# Patient Record
Sex: Female | Born: 1940 | Race: White | Hispanic: No | Smoking: Former smoker
Health system: Southern US, Community
[De-identification: ages and names within clinical notes are randomized; demographics above are authoritative.]

## PROBLEM LIST (undated history)

## (undated) DIAGNOSIS — J189 Pneumonia, unspecified organism: Secondary | ICD-10-CM

## (undated) DIAGNOSIS — D229 Melanocytic nevi, unspecified: Secondary | ICD-10-CM

## (undated) DIAGNOSIS — M8580 Other specified disorders of bone density and structure, unspecified site: Secondary | ICD-10-CM

## (undated) DIAGNOSIS — K5730 Diverticulosis of large intestine without perforation or abscess without bleeding: Secondary | ICD-10-CM

## (undated) DIAGNOSIS — I5189 Other ill-defined heart diseases: Secondary | ICD-10-CM

## (undated) DIAGNOSIS — F419 Anxiety disorder, unspecified: Secondary | ICD-10-CM

## (undated) DIAGNOSIS — D649 Anemia, unspecified: Secondary | ICD-10-CM

## (undated) DIAGNOSIS — M797 Fibromyalgia: Secondary | ICD-10-CM

## (undated) DIAGNOSIS — I671 Cerebral aneurysm, nonruptured: Secondary | ICD-10-CM

## (undated) DIAGNOSIS — F329 Major depressive disorder, single episode, unspecified: Secondary | ICD-10-CM

## (undated) DIAGNOSIS — J449 Chronic obstructive pulmonary disease, unspecified: Secondary | ICD-10-CM

## (undated) DIAGNOSIS — I480 Paroxysmal atrial fibrillation: Secondary | ICD-10-CM

## (undated) DIAGNOSIS — G8929 Other chronic pain: Secondary | ICD-10-CM

## (undated) DIAGNOSIS — F32A Depression, unspecified: Secondary | ICD-10-CM

## (undated) DIAGNOSIS — K219 Gastro-esophageal reflux disease without esophagitis: Secondary | ICD-10-CM

## (undated) DIAGNOSIS — G4733 Obstructive sleep apnea (adult) (pediatric): Secondary | ICD-10-CM

## (undated) DIAGNOSIS — M549 Dorsalgia, unspecified: Secondary | ICD-10-CM

## (undated) DIAGNOSIS — I10 Essential (primary) hypertension: Secondary | ICD-10-CM

## (undated) DIAGNOSIS — H8109 Meniere's disease, unspecified ear: Secondary | ICD-10-CM

## (undated) DIAGNOSIS — N390 Urinary tract infection, site not specified: Secondary | ICD-10-CM

## (undated) DIAGNOSIS — M159 Polyosteoarthritis, unspecified: Secondary | ICD-10-CM

## (undated) DIAGNOSIS — Z90710 Acquired absence of both cervix and uterus: Secondary | ICD-10-CM

## (undated) DIAGNOSIS — A419 Sepsis, unspecified organism: Secondary | ICD-10-CM

## (undated) DIAGNOSIS — E876 Hypokalemia: Secondary | ICD-10-CM

## (undated) DIAGNOSIS — E785 Hyperlipidemia, unspecified: Secondary | ICD-10-CM

## (undated) DIAGNOSIS — G9389 Other specified disorders of brain: Secondary | ICD-10-CM

## (undated) HISTORY — DX: Hypokalemia: E87.6

## (undated) HISTORY — DX: Paroxysmal atrial fibrillation: I48.0

## (undated) HISTORY — DX: Major depressive disorder, single episode, unspecified: F32.9

## (undated) HISTORY — DX: Hyperlipidemia, unspecified: E78.5

## (undated) HISTORY — DX: Meniere's disease, unspecified ear: H81.09

## (undated) HISTORY — DX: Pneumonia, unspecified organism: J18.9

## (undated) HISTORY — DX: Melanocytic nevi, unspecified: D22.9

## (undated) HISTORY — DX: Chronic obstructive pulmonary disease, unspecified: J44.9

## (undated) HISTORY — DX: Cerebral aneurysm, nonruptured: I67.1

## (undated) HISTORY — DX: Other chronic pain: G89.29

## (undated) HISTORY — DX: Gastro-esophageal reflux disease without esophagitis: K21.9

## (undated) HISTORY — DX: Other ill-defined heart diseases: I51.89

## (undated) HISTORY — DX: Urinary tract infection, site not specified: N39.0

## (undated) HISTORY — DX: Sepsis, unspecified organism: A41.9

## (undated) HISTORY — DX: Acquired absence of both cervix and uterus: Z90.710

## (undated) HISTORY — DX: Other specified disorders of bone density and structure, unspecified site: M85.80

## (undated) HISTORY — DX: Essential (primary) hypertension: I10

## (undated) HISTORY — DX: Anxiety disorder, unspecified: F41.9

## (undated) HISTORY — DX: Other specified disorders of brain: G93.89

## (undated) HISTORY — DX: Diverticulosis of large intestine without perforation or abscess without bleeding: K57.30

## (undated) HISTORY — DX: Dorsalgia, unspecified: M54.9

## (undated) HISTORY — DX: Polyosteoarthritis, unspecified: M15.9

## (undated) HISTORY — DX: Obstructive sleep apnea (adult) (pediatric): G47.33

## (undated) HISTORY — DX: Fibromyalgia: M79.7

## (undated) HISTORY — DX: Anemia, unspecified: D64.9

## (undated) HISTORY — DX: Depression, unspecified: F32.A

---

## 1973-12-06 DIAGNOSIS — Z90710 Acquired absence of both cervix and uterus: Secondary | ICD-10-CM

## 1973-12-06 HISTORY — PX: ABDOMINAL HYSTERECTOMY: SHX81

## 1973-12-06 HISTORY — DX: Acquired absence of both cervix and uterus: Z90.710

## 1998-04-14 ENCOUNTER — Encounter: Admission: RE | Admit: 1998-04-14 | Discharge: 1998-04-14 | Payer: Self-pay | Admitting: Internal Medicine

## 1998-05-12 ENCOUNTER — Encounter: Admission: RE | Admit: 1998-05-12 | Discharge: 1998-05-12 | Payer: Self-pay | Admitting: Internal Medicine

## 1998-07-01 ENCOUNTER — Ambulatory Visit (HOSPITAL_COMMUNITY): Admission: RE | Admit: 1998-07-01 | Discharge: 1998-07-01 | Payer: Self-pay | Admitting: Internal Medicine

## 1998-07-10 ENCOUNTER — Encounter: Admission: RE | Admit: 1998-07-10 | Discharge: 1998-07-10 | Payer: Self-pay | Admitting: Internal Medicine

## 1998-10-16 ENCOUNTER — Encounter: Payer: Self-pay | Admitting: Internal Medicine

## 1998-10-16 ENCOUNTER — Ambulatory Visit (HOSPITAL_COMMUNITY): Admission: RE | Admit: 1998-10-16 | Discharge: 1998-10-16 | Payer: Self-pay | Admitting: Internal Medicine

## 1998-10-16 ENCOUNTER — Encounter: Admission: RE | Admit: 1998-10-16 | Discharge: 1998-10-16 | Payer: Self-pay | Admitting: Internal Medicine

## 1998-12-11 ENCOUNTER — Encounter: Admission: RE | Admit: 1998-12-11 | Discharge: 1998-12-11 | Payer: Self-pay | Admitting: Internal Medicine

## 1999-01-15 ENCOUNTER — Encounter: Admission: RE | Admit: 1999-01-15 | Discharge: 1999-01-15 | Payer: Self-pay | Admitting: Internal Medicine

## 1999-03-10 ENCOUNTER — Encounter: Admission: RE | Admit: 1999-03-10 | Discharge: 1999-03-10 | Payer: Self-pay | Admitting: Internal Medicine

## 1999-05-14 ENCOUNTER — Encounter: Admission: RE | Admit: 1999-05-14 | Discharge: 1999-05-14 | Payer: Self-pay | Admitting: Internal Medicine

## 1999-06-22 ENCOUNTER — Ambulatory Visit (HOSPITAL_COMMUNITY): Admission: RE | Admit: 1999-06-22 | Discharge: 1999-06-22 | Payer: Self-pay | Admitting: Obstetrics & Gynecology

## 1999-07-08 ENCOUNTER — Encounter: Admission: RE | Admit: 1999-07-08 | Discharge: 1999-07-08 | Payer: Self-pay | Admitting: Hematology and Oncology

## 1999-07-15 ENCOUNTER — Encounter: Admission: RE | Admit: 1999-07-15 | Discharge: 1999-07-15 | Payer: Self-pay | Admitting: Hematology and Oncology

## 1999-08-12 ENCOUNTER — Encounter: Admission: RE | Admit: 1999-08-12 | Discharge: 1999-08-12 | Payer: Self-pay | Admitting: Internal Medicine

## 1999-11-11 ENCOUNTER — Encounter: Admission: RE | Admit: 1999-11-11 | Discharge: 1999-11-11 | Payer: Self-pay | Admitting: Internal Medicine

## 2000-02-17 ENCOUNTER — Encounter: Admission: RE | Admit: 2000-02-17 | Discharge: 2000-02-17 | Payer: Self-pay | Admitting: Internal Medicine

## 2000-05-25 ENCOUNTER — Encounter: Admission: RE | Admit: 2000-05-25 | Discharge: 2000-05-25 | Payer: Self-pay | Admitting: Internal Medicine

## 2000-06-28 ENCOUNTER — Ambulatory Visit (HOSPITAL_COMMUNITY): Admission: RE | Admit: 2000-06-28 | Discharge: 2000-06-28 | Payer: Self-pay | Admitting: Internal Medicine

## 2000-07-04 ENCOUNTER — Encounter: Admission: RE | Admit: 2000-07-04 | Discharge: 2000-07-04 | Payer: Self-pay | Admitting: Internal Medicine

## 2000-08-10 ENCOUNTER — Encounter: Admission: RE | Admit: 2000-08-10 | Discharge: 2000-08-10 | Payer: Self-pay | Admitting: Internal Medicine

## 2000-08-10 ENCOUNTER — Encounter: Payer: Self-pay | Admitting: Internal Medicine

## 2000-08-10 ENCOUNTER — Ambulatory Visit (HOSPITAL_COMMUNITY): Admission: RE | Admit: 2000-08-10 | Discharge: 2000-08-10 | Payer: Self-pay | Admitting: Internal Medicine

## 2000-10-19 ENCOUNTER — Encounter: Admission: RE | Admit: 2000-10-19 | Discharge: 2000-10-19 | Payer: Self-pay | Admitting: Internal Medicine

## 2000-10-19 ENCOUNTER — Ambulatory Visit (HOSPITAL_COMMUNITY): Admission: RE | Admit: 2000-10-19 | Discharge: 2000-10-19 | Payer: Self-pay | Admitting: Internal Medicine

## 2000-10-19 ENCOUNTER — Encounter: Payer: Self-pay | Admitting: Internal Medicine

## 2001-02-06 ENCOUNTER — Encounter: Admission: RE | Admit: 2001-02-06 | Discharge: 2001-02-06 | Payer: Self-pay | Admitting: Internal Medicine

## 2001-02-07 ENCOUNTER — Encounter: Admission: RE | Admit: 2001-02-07 | Discharge: 2001-02-07 | Payer: Self-pay | Admitting: Hematology and Oncology

## 2001-03-22 ENCOUNTER — Encounter: Admission: RE | Admit: 2001-03-22 | Discharge: 2001-03-22 | Payer: Self-pay | Admitting: Internal Medicine

## 2001-05-24 ENCOUNTER — Encounter: Admission: RE | Admit: 2001-05-24 | Discharge: 2001-05-24 | Payer: Self-pay | Admitting: Internal Medicine

## 2001-06-27 ENCOUNTER — Encounter: Admission: RE | Admit: 2001-06-27 | Discharge: 2001-06-27 | Payer: Self-pay | Admitting: Internal Medicine

## 2001-07-02 ENCOUNTER — Ambulatory Visit (HOSPITAL_COMMUNITY): Admission: RE | Admit: 2001-07-02 | Discharge: 2001-07-02 | Payer: Self-pay | Admitting: Internal Medicine

## 2001-07-02 ENCOUNTER — Encounter: Payer: Self-pay | Admitting: Internal Medicine

## 2001-07-05 ENCOUNTER — Ambulatory Visit (HOSPITAL_COMMUNITY): Admission: RE | Admit: 2001-07-05 | Discharge: 2001-07-05 | Payer: Self-pay | Admitting: *Deleted

## 2001-08-09 ENCOUNTER — Encounter: Admission: RE | Admit: 2001-08-09 | Discharge: 2001-08-09 | Payer: Self-pay | Admitting: Internal Medicine

## 2001-09-06 ENCOUNTER — Encounter: Admission: RE | Admit: 2001-09-06 | Discharge: 2001-09-06 | Payer: Self-pay | Admitting: Internal Medicine

## 2001-11-15 ENCOUNTER — Encounter: Admission: RE | Admit: 2001-11-15 | Discharge: 2001-11-15 | Payer: Self-pay | Admitting: Internal Medicine

## 2001-12-15 ENCOUNTER — Emergency Department (HOSPITAL_COMMUNITY): Admission: EM | Admit: 2001-12-15 | Discharge: 2001-12-15 | Payer: Self-pay | Admitting: Emergency Medicine

## 2001-12-15 ENCOUNTER — Encounter: Payer: Self-pay | Admitting: Emergency Medicine

## 2002-01-24 ENCOUNTER — Encounter: Admission: RE | Admit: 2002-01-24 | Discharge: 2002-01-24 | Payer: Self-pay | Admitting: Internal Medicine

## 2002-02-09 ENCOUNTER — Encounter: Admission: RE | Admit: 2002-02-09 | Discharge: 2002-02-09 | Payer: Self-pay | Admitting: Internal Medicine

## 2002-02-28 ENCOUNTER — Encounter: Admission: RE | Admit: 2002-02-28 | Discharge: 2002-02-28 | Payer: Self-pay | Admitting: Internal Medicine

## 2002-05-14 ENCOUNTER — Encounter: Admission: RE | Admit: 2002-05-14 | Discharge: 2002-05-14 | Payer: Self-pay | Admitting: Internal Medicine

## 2002-05-30 ENCOUNTER — Encounter: Admission: RE | Admit: 2002-05-30 | Discharge: 2002-05-30 | Payer: Self-pay | Admitting: Internal Medicine

## 2002-07-04 ENCOUNTER — Encounter: Admission: RE | Admit: 2002-07-04 | Discharge: 2002-07-04 | Payer: Self-pay | Admitting: Internal Medicine

## 2002-07-18 ENCOUNTER — Ambulatory Visit (HOSPITAL_COMMUNITY): Admission: RE | Admit: 2002-07-18 | Discharge: 2002-07-18 | Payer: Self-pay | Admitting: Internal Medicine

## 2002-09-27 ENCOUNTER — Encounter: Admission: RE | Admit: 2002-09-27 | Discharge: 2002-09-27 | Payer: Self-pay | Admitting: Internal Medicine

## 2002-12-19 ENCOUNTER — Encounter: Admission: RE | Admit: 2002-12-19 | Discharge: 2002-12-19 | Payer: Self-pay | Admitting: Internal Medicine

## 2003-03-12 ENCOUNTER — Encounter: Admission: RE | Admit: 2003-03-12 | Discharge: 2003-03-12 | Payer: Self-pay | Admitting: Internal Medicine

## 2003-03-20 ENCOUNTER — Encounter: Admission: RE | Admit: 2003-03-20 | Discharge: 2003-03-20 | Payer: Self-pay | Admitting: Internal Medicine

## 2003-05-01 ENCOUNTER — Encounter: Admission: RE | Admit: 2003-05-01 | Discharge: 2003-05-01 | Payer: Self-pay | Admitting: Internal Medicine

## 2003-06-19 ENCOUNTER — Encounter: Payer: Self-pay | Admitting: Internal Medicine

## 2003-06-19 ENCOUNTER — Encounter: Admission: RE | Admit: 2003-06-19 | Discharge: 2003-06-19 | Payer: Self-pay | Admitting: Internal Medicine

## 2003-06-19 ENCOUNTER — Ambulatory Visit (HOSPITAL_COMMUNITY): Admission: RE | Admit: 2003-06-19 | Discharge: 2003-06-19 | Payer: Self-pay | Admitting: Internal Medicine

## 2003-07-24 ENCOUNTER — Encounter: Admission: RE | Admit: 2003-07-24 | Discharge: 2003-07-24 | Payer: Self-pay | Admitting: Internal Medicine

## 2003-08-20 ENCOUNTER — Ambulatory Visit (HOSPITAL_COMMUNITY): Admission: RE | Admit: 2003-08-20 | Discharge: 2003-08-20 | Payer: Self-pay | Admitting: Internal Medicine

## 2003-10-03 ENCOUNTER — Encounter: Admission: RE | Admit: 2003-10-03 | Discharge: 2003-10-03 | Payer: Self-pay | Admitting: Internal Medicine

## 2003-12-12 ENCOUNTER — Encounter: Admission: RE | Admit: 2003-12-12 | Discharge: 2003-12-12 | Payer: Self-pay | Admitting: Internal Medicine

## 2004-01-29 ENCOUNTER — Encounter: Admission: RE | Admit: 2004-01-29 | Discharge: 2004-01-29 | Payer: Self-pay | Admitting: Internal Medicine

## 2004-04-28 ENCOUNTER — Encounter: Admission: RE | Admit: 2004-04-28 | Discharge: 2004-04-28 | Payer: Self-pay | Admitting: Internal Medicine

## 2004-06-03 ENCOUNTER — Encounter: Admission: RE | Admit: 2004-06-03 | Discharge: 2004-06-03 | Payer: Self-pay | Admitting: Internal Medicine

## 2004-06-09 ENCOUNTER — Encounter: Admission: RE | Admit: 2004-06-09 | Discharge: 2004-06-09 | Payer: Self-pay | Admitting: Otolaryngology

## 2004-06-17 ENCOUNTER — Encounter: Payer: Self-pay | Admitting: Otolaryngology

## 2004-07-01 ENCOUNTER — Encounter: Admission: RE | Admit: 2004-07-01 | Discharge: 2004-07-01 | Payer: Self-pay | Admitting: Internal Medicine

## 2004-07-22 ENCOUNTER — Inpatient Hospital Stay (HOSPITAL_COMMUNITY): Admission: RE | Admit: 2004-07-22 | Discharge: 2004-07-23 | Payer: Self-pay | Admitting: Interventional Radiology

## 2004-09-02 ENCOUNTER — Ambulatory Visit: Payer: Self-pay | Admitting: Internal Medicine

## 2004-10-16 ENCOUNTER — Ambulatory Visit (HOSPITAL_COMMUNITY): Admission: RE | Admit: 2004-10-16 | Discharge: 2004-10-16 | Payer: Self-pay | Admitting: Interventional Radiology

## 2004-11-04 ENCOUNTER — Ambulatory Visit: Payer: Self-pay | Admitting: Internal Medicine

## 2004-11-05 DIAGNOSIS — J189 Pneumonia, unspecified organism: Secondary | ICD-10-CM

## 2004-11-05 HISTORY — DX: Pneumonia, unspecified organism: J18.9

## 2004-11-10 ENCOUNTER — Inpatient Hospital Stay (HOSPITAL_COMMUNITY): Admission: RE | Admit: 2004-11-10 | Discharge: 2004-11-11 | Payer: Self-pay | Admitting: Interventional Radiology

## 2004-11-10 HISTORY — PX: ANEURYSM COILING: SHX5349

## 2004-12-01 ENCOUNTER — Inpatient Hospital Stay (HOSPITAL_COMMUNITY): Admission: AD | Admit: 2004-12-01 | Discharge: 2004-12-15 | Payer: Self-pay | Admitting: Internal Medicine

## 2004-12-01 ENCOUNTER — Ambulatory Visit: Payer: Self-pay | Admitting: Internal Medicine

## 2004-12-02 ENCOUNTER — Ambulatory Visit: Payer: Self-pay | Admitting: Internal Medicine

## 2004-12-15 ENCOUNTER — Ambulatory Visit: Payer: Self-pay | Admitting: Physical Medicine & Rehabilitation

## 2004-12-15 ENCOUNTER — Inpatient Hospital Stay
Admission: RE | Admit: 2004-12-15 | Discharge: 2004-12-24 | Payer: Self-pay | Admitting: Physical Medicine & Rehabilitation

## 2004-12-30 ENCOUNTER — Ambulatory Visit (HOSPITAL_COMMUNITY): Admission: RE | Admit: 2004-12-30 | Discharge: 2004-12-30 | Payer: Self-pay | Admitting: Internal Medicine

## 2004-12-30 ENCOUNTER — Ambulatory Visit: Payer: Self-pay | Admitting: Internal Medicine

## 2005-01-08 ENCOUNTER — Ambulatory Visit: Payer: Self-pay | Admitting: Cardiology

## 2005-01-08 ENCOUNTER — Inpatient Hospital Stay (HOSPITAL_COMMUNITY): Admission: EM | Admit: 2005-01-08 | Discharge: 2005-01-15 | Payer: Self-pay | Admitting: Emergency Medicine

## 2005-01-08 ENCOUNTER — Ambulatory Visit: Payer: Self-pay | Admitting: Internal Medicine

## 2005-01-15 ENCOUNTER — Encounter: Payer: Self-pay | Admitting: Cardiology

## 2005-01-27 ENCOUNTER — Ambulatory Visit (HOSPITAL_COMMUNITY): Admission: RE | Admit: 2005-01-27 | Discharge: 2005-01-27 | Payer: Self-pay | Admitting: Internal Medicine

## 2005-01-27 ENCOUNTER — Ambulatory Visit: Payer: Self-pay | Admitting: Internal Medicine

## 2005-02-10 ENCOUNTER — Ambulatory Visit: Payer: Self-pay | Admitting: Internal Medicine

## 2005-02-17 ENCOUNTER — Ambulatory Visit: Payer: Self-pay | Admitting: Internal Medicine

## 2005-03-17 ENCOUNTER — Ambulatory Visit: Payer: Self-pay | Admitting: Internal Medicine

## 2005-03-17 ENCOUNTER — Ambulatory Visit (HOSPITAL_COMMUNITY): Admission: RE | Admit: 2005-03-17 | Discharge: 2005-03-17 | Payer: Self-pay | Admitting: Internal Medicine

## 2005-03-19 ENCOUNTER — Ambulatory Visit (HOSPITAL_COMMUNITY): Admission: RE | Admit: 2005-03-19 | Discharge: 2005-03-19 | Payer: Self-pay | Admitting: Interventional Radiology

## 2005-04-21 ENCOUNTER — Ambulatory Visit: Payer: Self-pay | Admitting: Internal Medicine

## 2005-05-05 ENCOUNTER — Ambulatory Visit: Payer: Self-pay | Admitting: Internal Medicine

## 2005-05-10 ENCOUNTER — Ambulatory Visit: Payer: Self-pay | Admitting: Critical Care Medicine

## 2005-05-11 ENCOUNTER — Ambulatory Visit: Payer: Self-pay | Admitting: Critical Care Medicine

## 2005-05-11 ENCOUNTER — Ambulatory Visit: Payer: Self-pay | Admitting: Internal Medicine

## 2005-05-11 ENCOUNTER — Ambulatory Visit: Payer: Self-pay | Admitting: *Deleted

## 2005-05-14 ENCOUNTER — Ambulatory Visit: Payer: Self-pay | Admitting: Critical Care Medicine

## 2005-06-30 ENCOUNTER — Ambulatory Visit: Payer: Self-pay | Admitting: Internal Medicine

## 2005-07-01 ENCOUNTER — Ambulatory Visit: Payer: Self-pay | Admitting: Critical Care Medicine

## 2005-07-06 ENCOUNTER — Ambulatory Visit (HOSPITAL_COMMUNITY): Admission: RE | Admit: 2005-07-06 | Discharge: 2005-07-06 | Payer: Self-pay | Admitting: Internal Medicine

## 2005-09-01 ENCOUNTER — Ambulatory Visit: Payer: Self-pay | Admitting: Internal Medicine

## 2005-09-01 ENCOUNTER — Ambulatory Visit (HOSPITAL_COMMUNITY): Admission: RE | Admit: 2005-09-01 | Discharge: 2005-09-01 | Payer: Self-pay | Admitting: Internal Medicine

## 2005-10-06 ENCOUNTER — Ambulatory Visit: Payer: Self-pay | Admitting: Internal Medicine

## 2005-10-27 ENCOUNTER — Ambulatory Visit (HOSPITAL_COMMUNITY): Admission: RE | Admit: 2005-10-27 | Discharge: 2005-10-27 | Payer: Self-pay | Admitting: Interventional Radiology

## 2006-01-05 ENCOUNTER — Ambulatory Visit: Payer: Self-pay | Admitting: Internal Medicine

## 2006-02-23 ENCOUNTER — Ambulatory Visit: Payer: Self-pay | Admitting: Critical Care Medicine

## 2006-04-27 ENCOUNTER — Ambulatory Visit: Payer: Self-pay | Admitting: Internal Medicine

## 2006-05-18 ENCOUNTER — Ambulatory Visit: Payer: Self-pay | Admitting: Internal Medicine

## 2006-05-27 ENCOUNTER — Ambulatory Visit: Payer: Self-pay | Admitting: Critical Care Medicine

## 2006-07-20 ENCOUNTER — Ambulatory Visit: Payer: Self-pay | Admitting: Internal Medicine

## 2006-08-03 ENCOUNTER — Ambulatory Visit: Payer: Self-pay | Admitting: Critical Care Medicine

## 2006-08-03 ENCOUNTER — Ambulatory Visit (HOSPITAL_COMMUNITY): Admission: RE | Admit: 2006-08-03 | Discharge: 2006-08-03 | Payer: Self-pay | Admitting: Internal Medicine

## 2006-08-16 ENCOUNTER — Ambulatory Visit: Payer: Self-pay | Admitting: Internal Medicine

## 2006-09-02 ENCOUNTER — Ambulatory Visit (HOSPITAL_COMMUNITY): Admission: RE | Admit: 2006-09-02 | Discharge: 2006-09-02 | Payer: Self-pay | Admitting: Interventional Radiology

## 2006-09-05 ENCOUNTER — Ambulatory Visit: Payer: Self-pay | Admitting: Critical Care Medicine

## 2006-09-29 ENCOUNTER — Ambulatory Visit: Payer: Self-pay | Admitting: Internal Medicine

## 2006-09-29 LAB — CONVERTED CEMR LAB
CO2: 32 meq/L (ref 19–32)
Calcium: 9.4 mg/dL (ref 8.4–10.5)
Sodium: 139 meq/L (ref 135–145)

## 2006-11-12 IMAGING — CT CT EXTREM LOW BILAT W/ CM
1 series · 12 of 14 positions shown, 15 images · non-contrast
Comparison: none

CLINICAL DATA: Pneumonia.  Evaluate for DVT.
CT LOWER EXTREMITY ? DVT PROTOCOL:
Scans were obtained with the DVT protocol.  There is relatively poor opacification of the venous system.  As a result, it would be difficult to exclude deep venous thrombosis and Doppler evaluation of the lower extremities may be helpful if felt to be indicated clinically.  There is an approximate 3 cm in size left popliteal cyst (Baker?s cyst) incidentally noted.

[Series 2: dvt · axial · 0.95mm/px · z∈[-1110,-540]mm · 12 of 23 slices shown, 15 images]
[im 2/23  soft-tissue]
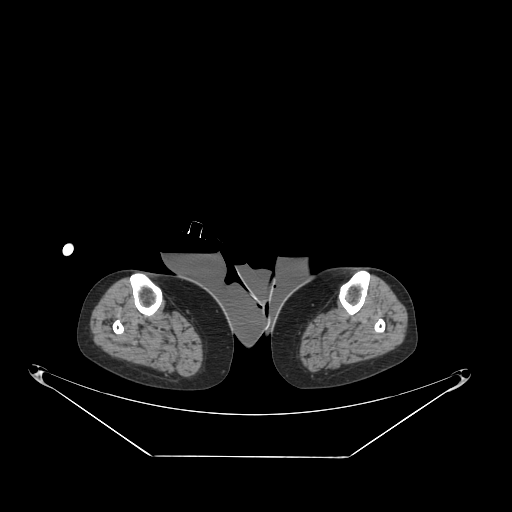
[im 2/23  bone]
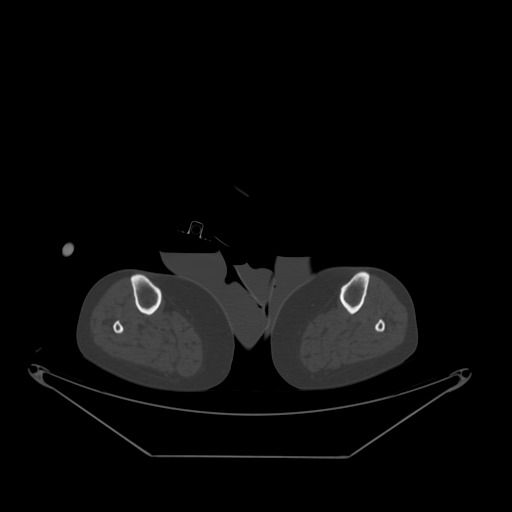
[im 4/23  bone]
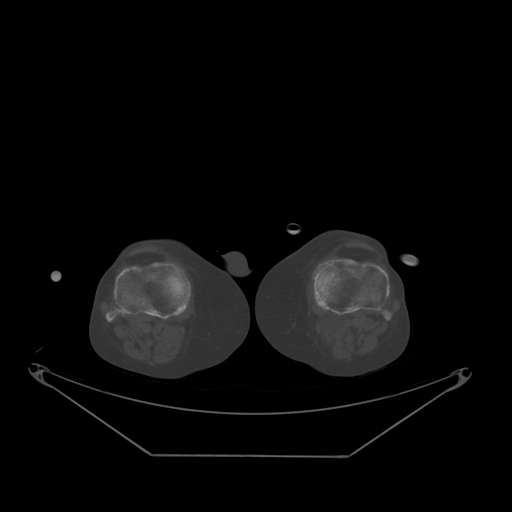
[im 6/23  bone]
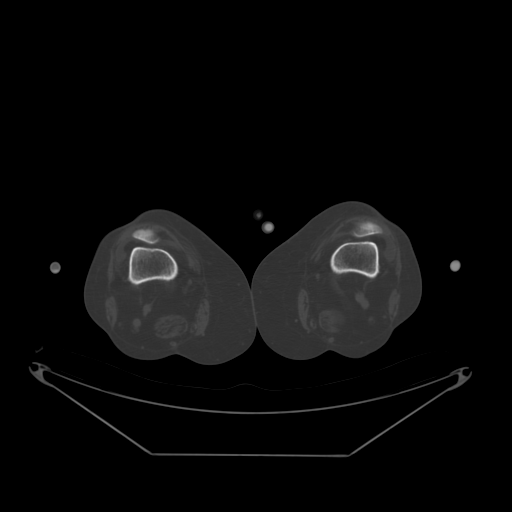
[im 7/23  bone]
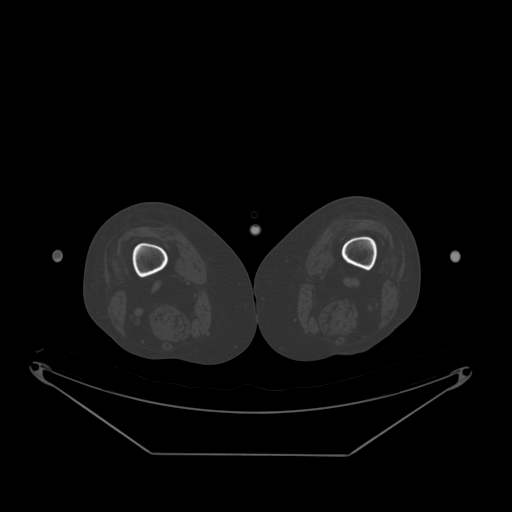
[im 9/23  soft-tissue]
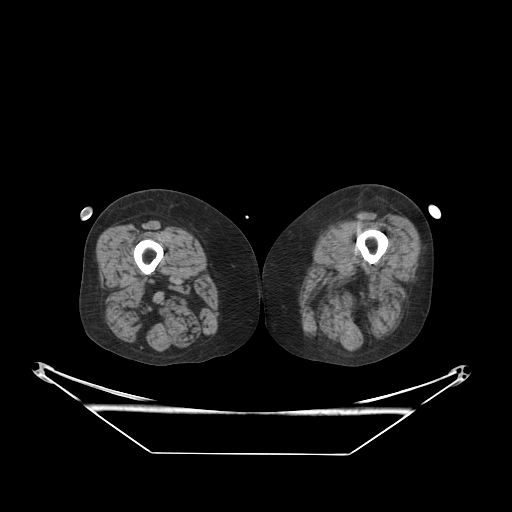
[im 9/23  bone]
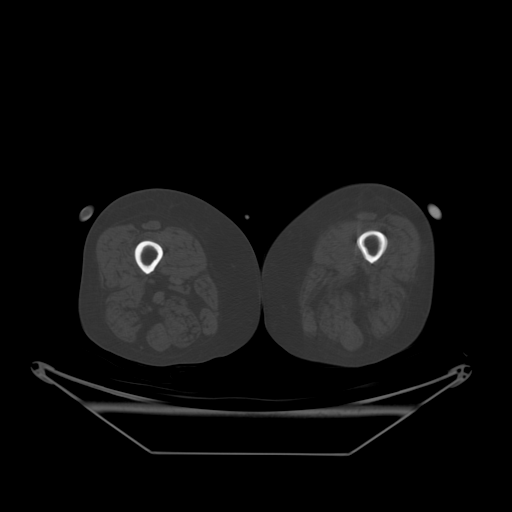
[im 11/23  bone]
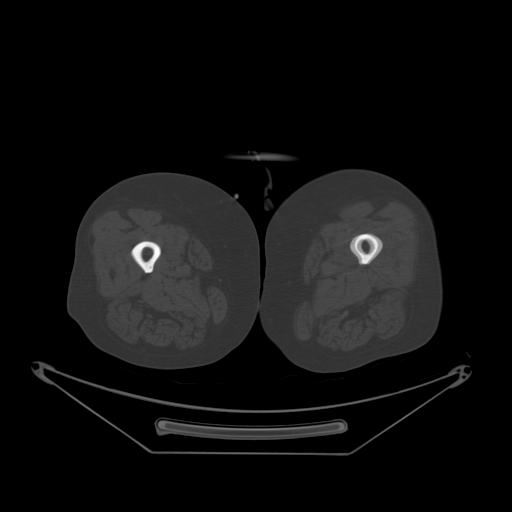
[im 12/23  bone]
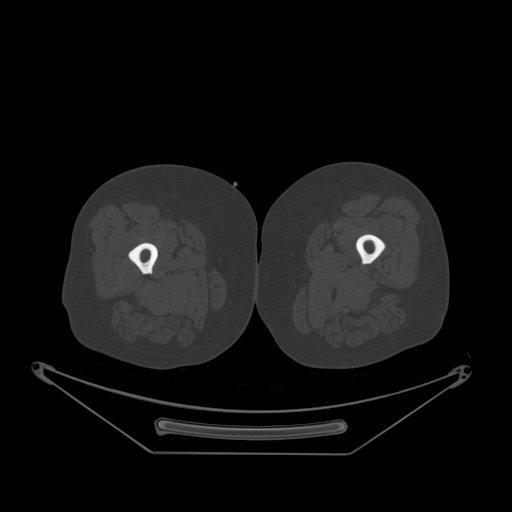
[im 14/23  bone]
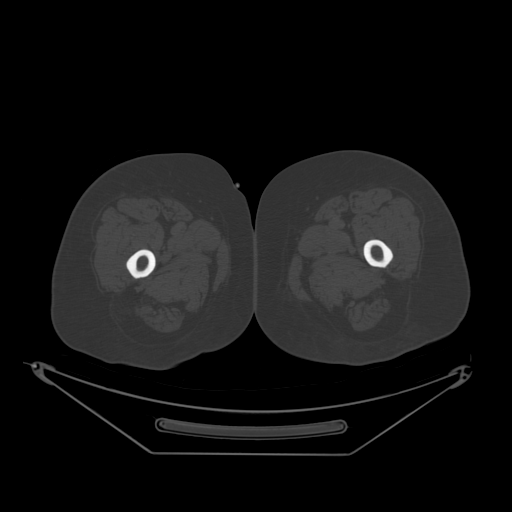
[im 16/23  soft-tissue]
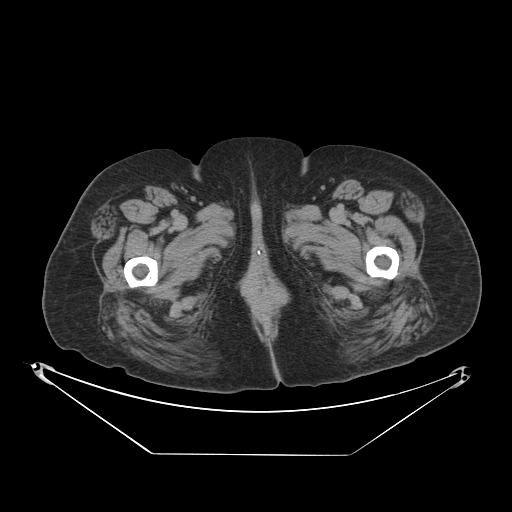
[im 16/23  bone]
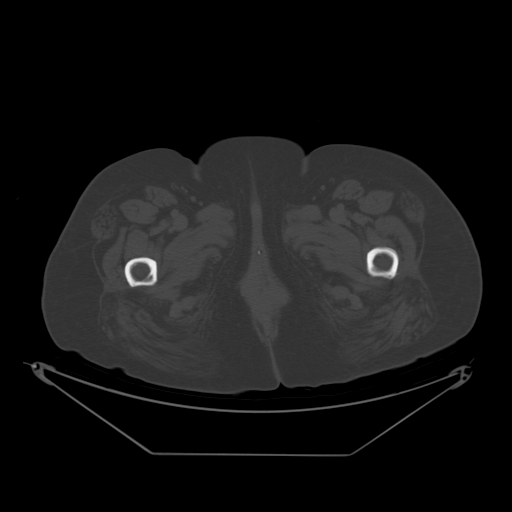
[im 17/23  bone]
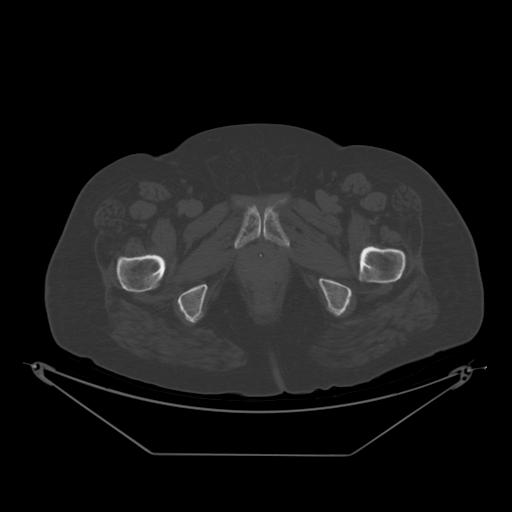
[im 19/23  bone]
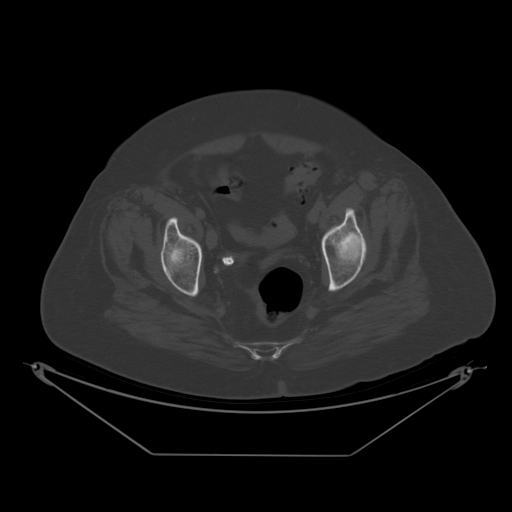
[im 21/23  bone]
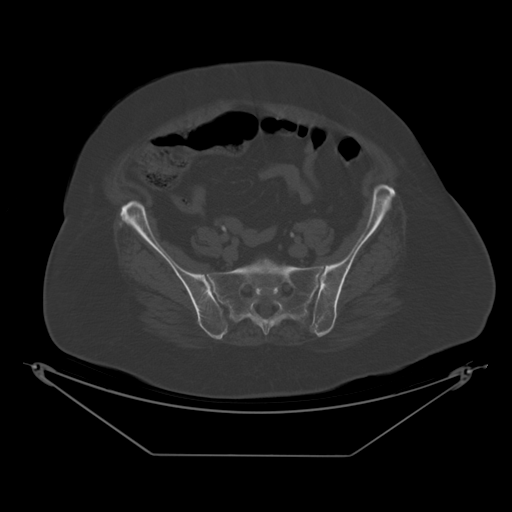

[12 of 14 positions shown; findings below may reference images not displayed]

IMPRESSION: 1.  No CT scan evidence for lower extremity deep venous thrombosis.  However, the veins are relatively poorly opacified and Doppler evaluation of the lower extremities may be helpful to exclude deep venous thrombosis.
2.   3 cm in size left-sided popliteal cyst.

## 2006-12-22 ENCOUNTER — Telehealth: Payer: Self-pay | Admitting: Internal Medicine

## 2006-12-23 ENCOUNTER — Telehealth: Payer: Self-pay | Admitting: *Deleted

## 2006-12-29 ENCOUNTER — Encounter (INDEPENDENT_AMBULATORY_CARE_PROVIDER_SITE_OTHER): Payer: Self-pay | Admitting: Internal Medicine

## 2006-12-29 ENCOUNTER — Ambulatory Visit: Payer: Self-pay | Admitting: Internal Medicine

## 2006-12-29 ENCOUNTER — Ambulatory Visit (HOSPITAL_COMMUNITY): Admission: RE | Admit: 2006-12-29 | Discharge: 2006-12-29 | Payer: Self-pay | Admitting: Internal Medicine

## 2006-12-29 DIAGNOSIS — E785 Hyperlipidemia, unspecified: Secondary | ICD-10-CM | POA: Insufficient documentation

## 2006-12-29 DIAGNOSIS — J439 Emphysema, unspecified: Secondary | ICD-10-CM | POA: Insufficient documentation

## 2006-12-29 DIAGNOSIS — I10 Essential (primary) hypertension: Secondary | ICD-10-CM | POA: Insufficient documentation

## 2006-12-29 DIAGNOSIS — F32A Depression, unspecified: Secondary | ICD-10-CM | POA: Insufficient documentation

## 2006-12-29 DIAGNOSIS — E876 Hypokalemia: Secondary | ICD-10-CM | POA: Insufficient documentation

## 2006-12-29 DIAGNOSIS — F329 Major depressive disorder, single episode, unspecified: Secondary | ICD-10-CM | POA: Insufficient documentation

## 2006-12-29 DIAGNOSIS — Z8709 Personal history of other diseases of the respiratory system: Secondary | ICD-10-CM | POA: Insufficient documentation

## 2006-12-29 DIAGNOSIS — F411 Generalized anxiety disorder: Secondary | ICD-10-CM | POA: Insufficient documentation

## 2006-12-29 LAB — CONVERTED CEMR LAB
BUN: 3 mg/dL — ABNORMAL LOW (ref 6–23)
Basophils Relative: 0 % (ref 0–1)
CO2: 33 meq/L — ABNORMAL HIGH (ref 19–32)
Calcium: 8.9 mg/dL (ref 8.4–10.5)
Chloride: 100 meq/L (ref 96–112)
Cholesterol: 190 mg/dL (ref 0–200)
Creatinine, Ser: 0.69 mg/dL (ref 0.40–1.20)
Glucose, Bld: 119 mg/dL — ABNORMAL HIGH (ref 70–99)
HDL: 69 mg/dL (ref >39)
Hemoglobin: 10.7 g/dL — ABNORMAL LOW (ref 12.0–15.0)
Lymphocytes Relative: 18 % (ref 12–46)
MCHC: 33.4 g/dL (ref 30.0–36.0)
Monocytes Absolute: 0.8 10*3/uL — ABNORMAL HIGH (ref 0.2–0.7)
Monocytes Relative: 7 % (ref 3–11)
Neutro Abs: 9 10*3/uL — ABNORMAL HIGH (ref 1.7–7.7)
RBC: 3.53 M/uL — ABNORMAL LOW (ref 3.87–5.11)
Total CHOL/HDL Ratio: 2.8
Triglycerides: 76 mg/dL (ref <150)

## 2007-01-02 ENCOUNTER — Encounter: Payer: Self-pay | Admitting: Internal Medicine

## 2007-01-13 ENCOUNTER — Ambulatory Visit: Payer: Self-pay | Admitting: Internal Medicine

## 2007-01-13 DIAGNOSIS — M159 Polyosteoarthritis, unspecified: Secondary | ICD-10-CM | POA: Insufficient documentation

## 2007-01-16 ENCOUNTER — Telehealth (INDEPENDENT_AMBULATORY_CARE_PROVIDER_SITE_OTHER): Payer: Self-pay | Admitting: Hospitalist

## 2007-02-06 ENCOUNTER — Ambulatory Visit: Payer: Self-pay | Admitting: Critical Care Medicine

## 2007-03-22 ENCOUNTER — Ambulatory Visit (HOSPITAL_COMMUNITY): Admission: RE | Admit: 2007-03-22 | Discharge: 2007-03-22 | Payer: Self-pay | Admitting: Internal Medicine

## 2007-03-22 ENCOUNTER — Ambulatory Visit: Payer: Self-pay | Admitting: Internal Medicine

## 2007-03-22 DIAGNOSIS — D649 Anemia, unspecified: Secondary | ICD-10-CM | POA: Insufficient documentation

## 2007-03-22 LAB — CONVERTED CEMR LAB
BUN: 12 mg/dL (ref 6–23)
Basophils Relative: 0 % (ref 0–1)
Calcium: 9 mg/dL (ref 8.4–10.5)
Eosinophils Absolute: 0.2 10*3/uL (ref 0.0–0.7)
Eosinophils Relative: 2 % (ref 0–5)
MCHC: 30.1 g/dL (ref 30.0–36.0)
MCV: 93.5 fL (ref 78.0–100.0)
Neutrophils Relative %: 65 % (ref 43–77)
Platelets: 374 10*3/uL (ref 150–400)
Potassium: 3.4 meq/L — ABNORMAL LOW (ref 3.5–5.3)
Sodium: 142 meq/L (ref 135–145)

## 2007-03-23 DIAGNOSIS — H8109 Meniere's disease, unspecified ear: Secondary | ICD-10-CM | POA: Insufficient documentation

## 2007-03-23 DIAGNOSIS — I671 Cerebral aneurysm, nonruptured: Secondary | ICD-10-CM | POA: Insufficient documentation

## 2007-03-31 ENCOUNTER — Encounter: Payer: Self-pay | Admitting: Internal Medicine

## 2007-04-10 ENCOUNTER — Telehealth (INDEPENDENT_AMBULATORY_CARE_PROVIDER_SITE_OTHER): Payer: Self-pay | Admitting: *Deleted

## 2007-04-14 ENCOUNTER — Ambulatory Visit: Payer: Self-pay | Admitting: *Deleted

## 2007-04-24 ENCOUNTER — Telehealth (INDEPENDENT_AMBULATORY_CARE_PROVIDER_SITE_OTHER): Payer: Self-pay | Admitting: *Deleted

## 2007-05-08 ENCOUNTER — Ambulatory Visit (HOSPITAL_COMMUNITY): Admission: RE | Admit: 2007-05-08 | Discharge: 2007-05-08 | Payer: Self-pay | Admitting: Internal Medicine

## 2007-05-08 ENCOUNTER — Ambulatory Visit: Payer: Self-pay | Admitting: Internal Medicine

## 2007-05-08 ENCOUNTER — Telehealth: Payer: Self-pay | Admitting: *Deleted

## 2007-05-09 ENCOUNTER — Inpatient Hospital Stay (HOSPITAL_COMMUNITY): Admission: AD | Admit: 2007-05-09 | Discharge: 2007-05-11 | Payer: Self-pay | Admitting: Internal Medicine

## 2007-05-09 ENCOUNTER — Ambulatory Visit: Payer: Self-pay | Admitting: Internal Medicine

## 2007-05-09 ENCOUNTER — Telehealth (INDEPENDENT_AMBULATORY_CARE_PROVIDER_SITE_OTHER): Payer: Self-pay | Admitting: Internal Medicine

## 2007-05-18 ENCOUNTER — Telehealth: Payer: Self-pay | Admitting: *Deleted

## 2007-05-18 ENCOUNTER — Ambulatory Visit: Payer: Self-pay | Admitting: Internal Medicine

## 2007-06-12 ENCOUNTER — Telehealth (INDEPENDENT_AMBULATORY_CARE_PROVIDER_SITE_OTHER): Payer: Self-pay | Admitting: *Deleted

## 2007-07-27 ENCOUNTER — Ambulatory Visit: Payer: Self-pay | Admitting: Internal Medicine

## 2007-08-30 ENCOUNTER — Ambulatory Visit: Payer: Self-pay | Admitting: Internal Medicine

## 2007-08-30 LAB — CONVERTED CEMR LAB
BUN: 8 mg/dL (ref 6–23)
CO2: 26 meq/L (ref 19–32)
Chloride: 101 meq/L (ref 96–112)
Eosinophils Absolute: 0.2 10*3/uL (ref 0.0–0.7)
Glucose, Bld: 112 mg/dL — ABNORMAL HIGH (ref 70–99)
HCT: 38.9 % (ref 36.0–46.0)
Lymphocytes Relative: 35 % (ref 12–46)
Lymphs Abs: 3.7 10*3/uL — ABNORMAL HIGH (ref 0.7–3.3)
MCV: 88.8 fL (ref 78.0–100.0)
Monocytes Relative: 7 % (ref 3–11)
Neutrophils Relative %: 57 % (ref 43–77)
Potassium: 3.9 meq/L (ref 3.5–5.3)
RBC: 4.38 M/uL (ref 3.87–5.11)
Sodium: 142 meq/L (ref 135–145)
WBC: 10.6 10*3/uL — ABNORMAL HIGH (ref 4.0–10.5)

## 2007-09-07 ENCOUNTER — Ambulatory Visit (HOSPITAL_COMMUNITY): Admission: RE | Admit: 2007-09-07 | Discharge: 2007-09-07 | Payer: Self-pay | Admitting: Internal Medicine

## 2007-10-11 ENCOUNTER — Encounter: Payer: Self-pay | Admitting: Internal Medicine

## 2007-10-11 ENCOUNTER — Ambulatory Visit: Payer: Self-pay | Admitting: Critical Care Medicine

## 2007-11-08 ENCOUNTER — Ambulatory Visit: Payer: Self-pay | Admitting: Critical Care Medicine

## 2007-11-20 ENCOUNTER — Telehealth: Payer: Self-pay | Admitting: Critical Care Medicine

## 2007-11-28 ENCOUNTER — Telehealth: Payer: Self-pay | Admitting: *Deleted

## 2007-11-29 ENCOUNTER — Ambulatory Visit (HOSPITAL_COMMUNITY): Admission: RE | Admit: 2007-11-29 | Discharge: 2007-11-29 | Payer: Self-pay | Admitting: Interventional Radiology

## 2008-01-05 ENCOUNTER — Ambulatory Visit: Payer: Self-pay | Admitting: Internal Medicine

## 2008-01-05 LAB — CONVERTED CEMR LAB
Basophils Absolute: 0 10*3/uL (ref 0.0–0.1)
Basophils Relative: 0 % (ref 0–1)
Calcium: 9 mg/dL (ref 8.4–10.5)
Eosinophils Absolute: 0.2 10*3/uL (ref 0.0–0.7)
Eosinophils Relative: 2 % (ref 0–5)
Glucose, Bld: 103 mg/dL — ABNORMAL HIGH (ref 70–99)
HCT: 36.3 % (ref 36.0–46.0)
MCHC: 30.6 g/dL (ref 30.0–36.0)
MCV: 90.3 fL (ref 78.0–100.0)
Neutrophils Relative %: 62 % (ref 43–77)
Platelets: 356 10*3/uL (ref 150–400)
Potassium: 3.9 meq/L (ref 3.5–5.3)
RDW: 16.3 % — ABNORMAL HIGH (ref 11.5–15.5)
Sodium: 141 meq/L (ref 135–145)
WBC: 11.7 10*3/uL — ABNORMAL HIGH (ref 4.0–10.5)

## 2008-02-19 ENCOUNTER — Telehealth: Payer: Self-pay | Admitting: *Deleted

## 2008-02-22 ENCOUNTER — Ambulatory Visit: Payer: Self-pay | Admitting: Critical Care Medicine

## 2008-03-20 ENCOUNTER — Ambulatory Visit: Payer: Self-pay | Admitting: Internal Medicine

## 2008-03-20 DIAGNOSIS — R51 Headache: Secondary | ICD-10-CM

## 2008-03-20 DIAGNOSIS — R404 Transient alteration of awareness: Secondary | ICD-10-CM | POA: Insufficient documentation

## 2008-03-20 DIAGNOSIS — R519 Headache, unspecified: Secondary | ICD-10-CM | POA: Insufficient documentation

## 2008-03-20 LAB — CONVERTED CEMR LAB
Albumin: 3.9 g/dL (ref 3.5–5.2)
Alkaline Phosphatase: 95 units/L (ref 39–117)
BUN: 8 mg/dL (ref 6–23)
CO2: 27 meq/L (ref 19–32)
Cholesterol: 243 mg/dL — ABNORMAL HIGH (ref 0–200)
Eosinophils Absolute: 0.2 10*3/uL (ref 0.0–0.7)
Eosinophils Relative: 2 % (ref 0–5)
Glucose, Bld: 99 mg/dL (ref 70–99)
HCT: 36.3 % (ref 36.0–46.0)
HDL: 63 mg/dL (ref >39)
Lymphocytes Relative: 24 % (ref 12–46)
Lymphs Abs: 3 10*3/uL (ref 0.7–4.0)
MCV: 90.5 fL (ref 78.0–100.0)
Monocytes Relative: 8 % (ref 3–12)
Platelets: 438 10*3/uL — ABNORMAL HIGH (ref 150–400)
Potassium: 3.8 meq/L (ref 3.5–5.3)
RBC: 4.01 M/uL (ref 3.87–5.11)
Sodium: 142 meq/L (ref 135–145)
Total Bilirubin: 0.2 mg/dL — ABNORMAL LOW (ref 0.3–1.2)
Total Protein: 6.8 g/dL (ref 6.0–8.3)
Triglycerides: 270 mg/dL — ABNORMAL HIGH (ref <150)
WBC: 12.6 10*3/uL — ABNORMAL HIGH (ref 4.0–10.5)

## 2008-03-26 ENCOUNTER — Encounter: Payer: Self-pay | Admitting: Internal Medicine

## 2008-04-02 ENCOUNTER — Ambulatory Visit (HOSPITAL_BASED_OUTPATIENT_CLINIC_OR_DEPARTMENT_OTHER): Admission: RE | Admit: 2008-04-02 | Discharge: 2008-04-02 | Payer: Self-pay | Admitting: Internal Medicine

## 2008-04-06 ENCOUNTER — Ambulatory Visit: Payer: Self-pay | Admitting: Internal Medicine

## 2008-04-16 ENCOUNTER — Encounter: Payer: Self-pay | Admitting: Internal Medicine

## 2008-04-17 ENCOUNTER — Ambulatory Visit: Payer: Self-pay | Admitting: Internal Medicine

## 2008-04-17 DIAGNOSIS — G4733 Obstructive sleep apnea (adult) (pediatric): Secondary | ICD-10-CM | POA: Insufficient documentation

## 2008-04-19 DIAGNOSIS — K219 Gastro-esophageal reflux disease without esophagitis: Secondary | ICD-10-CM | POA: Insufficient documentation

## 2008-04-24 ENCOUNTER — Telehealth: Payer: Self-pay | Admitting: Internal Medicine

## 2008-04-26 ENCOUNTER — Encounter: Payer: Self-pay | Admitting: Internal Medicine

## 2008-05-02 ENCOUNTER — Encounter: Payer: Self-pay | Admitting: Internal Medicine

## 2008-05-22 ENCOUNTER — Ambulatory Visit: Payer: Self-pay | Admitting: Internal Medicine

## 2008-05-22 LAB — CONVERTED CEMR LAB
Basophils Absolute: 0.1 10*3/uL (ref 0.0–0.1)
Basophils Relative: 1 % (ref 0–1)
Eosinophils Relative: 2 % (ref 0–5)
HCT: 32.9 % — ABNORMAL LOW (ref 36.0–46.0)
Hemoglobin: 10.9 g/dL — ABNORMAL LOW (ref 12.0–15.0)
MCHC: 33.1 g/dL (ref 30.0–36.0)
Monocytes Absolute: 0.9 10*3/uL (ref 0.1–1.0)
RDW: 16.4 % — ABNORMAL HIGH (ref 11.5–15.5)

## 2008-05-23 ENCOUNTER — Telehealth: Payer: Self-pay | Admitting: Internal Medicine

## 2008-06-19 ENCOUNTER — Telehealth: Payer: Self-pay | Admitting: *Deleted

## 2008-06-24 LAB — CONVERTED CEMR LAB
Ferritin: 15 ng/mL (ref 10–291)
Saturation Ratios: 8 % — ABNORMAL LOW (ref 20–55)
TIBC: 382 ug/dL (ref 250–470)
UIBC: 350 ug/dL
Vitamin B-12: 603 pg/mL (ref 211–911)

## 2008-07-03 ENCOUNTER — Telehealth: Payer: Self-pay | Admitting: *Deleted

## 2008-07-08 ENCOUNTER — Ambulatory Visit: Payer: Self-pay | Admitting: Infectious Diseases

## 2008-07-11 ENCOUNTER — Encounter: Payer: Self-pay | Admitting: Internal Medicine

## 2008-07-17 DIAGNOSIS — K5730 Diverticulosis of large intestine without perforation or abscess without bleeding: Secondary | ICD-10-CM | POA: Insufficient documentation

## 2008-07-22 ENCOUNTER — Telehealth: Payer: Self-pay | Admitting: *Deleted

## 2008-08-30 ENCOUNTER — Encounter: Payer: Self-pay | Admitting: Internal Medicine

## 2008-09-12 ENCOUNTER — Encounter: Payer: Self-pay | Admitting: Internal Medicine

## 2008-09-12 ENCOUNTER — Ambulatory Visit: Payer: Self-pay | Admitting: Internal Medicine

## 2008-09-16 LAB — CONVERTED CEMR LAB
AST: 17 units/L (ref 0–37)
Albumin: 4.2 g/dL (ref 3.5–5.2)
Alkaline Phosphatase: 91 units/L (ref 39–117)
BUN: 7 mg/dL (ref 6–23)
HDL: 56 mg/dL (ref >39)
Hemoglobin: 13.2 g/dL (ref 12.0–15.0)
LDL Cholesterol: 122 mg/dL — ABNORMAL HIGH (ref 0–99)
MCHC: 33.3 g/dL (ref 30.0–36.0)
Potassium: 3.8 meq/L (ref 3.5–5.3)
RDW: 16.3 % — ABNORMAL HIGH (ref 11.5–15.5)
Sodium: 131 meq/L — ABNORMAL LOW (ref 135–145)
VLDL: 54 mg/dL — ABNORMAL HIGH (ref 0–40)

## 2008-09-20 ENCOUNTER — Ambulatory Visit: Payer: Self-pay | Admitting: Critical Care Medicine

## 2008-10-22 ENCOUNTER — Ambulatory Visit (HOSPITAL_COMMUNITY): Admission: RE | Admit: 2008-10-22 | Discharge: 2008-10-22 | Payer: Self-pay | Admitting: Internal Medicine

## 2008-11-07 ENCOUNTER — Telehealth: Payer: Self-pay | Admitting: *Deleted

## 2008-11-20 ENCOUNTER — Ambulatory Visit: Payer: Self-pay | Admitting: Internal Medicine

## 2008-11-20 DIAGNOSIS — R1310 Dysphagia, unspecified: Secondary | ICD-10-CM | POA: Insufficient documentation

## 2008-11-20 DIAGNOSIS — D229 Melanocytic nevi, unspecified: Secondary | ICD-10-CM | POA: Insufficient documentation

## 2008-12-13 ENCOUNTER — Encounter: Payer: Self-pay | Admitting: Internal Medicine

## 2008-12-20 ENCOUNTER — Ambulatory Visit (HOSPITAL_COMMUNITY): Admission: RE | Admit: 2008-12-20 | Discharge: 2008-12-20 | Payer: Self-pay | Admitting: Internal Medicine

## 2008-12-30 ENCOUNTER — Encounter: Payer: Self-pay | Admitting: Internal Medicine

## 2009-01-17 ENCOUNTER — Encounter: Payer: Self-pay | Admitting: Internal Medicine

## 2009-01-22 ENCOUNTER — Ambulatory Visit: Payer: Self-pay | Admitting: Internal Medicine

## 2009-01-22 DIAGNOSIS — N644 Mastodynia: Secondary | ICD-10-CM | POA: Insufficient documentation

## 2009-01-22 LAB — CONVERTED CEMR LAB
ALT: 19 units/L (ref 0–35)
Albumin: 4.2 g/dL (ref 3.5–5.2)
CO2: 27 meq/L (ref 19–32)
Calcium: 9.8 mg/dL (ref 8.4–10.5)
Chloride: 93 meq/L — ABNORMAL LOW (ref 96–112)
Eosinophils Relative: 4 % (ref 0–5)
Ferritin: 39 ng/mL (ref 10–291)
Glucose, Bld: 95 mg/dL (ref 70–99)
HCT: 37.9 % (ref 36.0–46.0)
Lymphocytes Relative: 33 % (ref 12–46)
Lymphs Abs: 3.6 10*3/uL (ref 0.7–4.0)
Neutro Abs: 6 10*3/uL (ref 1.7–7.7)
Neutrophils Relative %: 55 % (ref 43–77)
Platelets: 309 10*3/uL (ref 150–400)
Potassium: 3.9 meq/L (ref 3.5–5.3)
Sodium: 132 meq/L — ABNORMAL LOW (ref 135–145)
Total Protein: 6.8 g/dL (ref 6.0–8.3)
WBC: 10.9 10*3/uL — ABNORMAL HIGH (ref 4.0–10.5)

## 2009-02-19 ENCOUNTER — Telehealth: Payer: Self-pay | Admitting: Internal Medicine

## 2009-03-14 ENCOUNTER — Encounter: Payer: Self-pay | Admitting: Internal Medicine

## 2009-03-26 ENCOUNTER — Ambulatory Visit: Payer: Self-pay | Admitting: Internal Medicine

## 2009-04-25 ENCOUNTER — Encounter: Payer: Self-pay | Admitting: Internal Medicine

## 2009-05-12 ENCOUNTER — Telehealth: Payer: Self-pay | Admitting: *Deleted

## 2009-05-13 ENCOUNTER — Encounter: Payer: Self-pay | Admitting: Internal Medicine

## 2009-05-14 ENCOUNTER — Ambulatory Visit: Payer: Self-pay | Admitting: Critical Care Medicine

## 2009-06-20 ENCOUNTER — Ambulatory Visit: Payer: Self-pay | Admitting: Infectious Diseases

## 2009-07-04 ENCOUNTER — Telehealth: Payer: Self-pay | Admitting: Internal Medicine

## 2009-07-07 ENCOUNTER — Ambulatory Visit (HOSPITAL_COMMUNITY): Admission: RE | Admit: 2009-07-07 | Discharge: 2009-07-07 | Payer: Self-pay | Admitting: Interventional Radiology

## 2009-09-02 ENCOUNTER — Ambulatory Visit: Payer: Self-pay | Admitting: Internal Medicine

## 2009-09-02 ENCOUNTER — Ambulatory Visit (HOSPITAL_COMMUNITY): Admission: RE | Admit: 2009-09-02 | Discharge: 2009-09-02 | Payer: Self-pay | Admitting: Internal Medicine

## 2009-09-02 LAB — CONVERTED CEMR LAB: LDL Goal: 130 mg/dL

## 2009-09-24 ENCOUNTER — Ambulatory Visit: Payer: Self-pay | Admitting: Internal Medicine

## 2009-09-30 ENCOUNTER — Telehealth: Payer: Self-pay | Admitting: *Deleted

## 2009-10-03 LAB — CONVERTED CEMR LAB
BUN: 8 mg/dL (ref 6–23)
CO2: 27 meq/L (ref 19–32)
Calcium: 9.4 mg/dL (ref 8.4–10.5)
Cholesterol: 247 mg/dL — ABNORMAL HIGH (ref 0–200)
Glucose, Bld: 111 mg/dL — ABNORMAL HIGH (ref 70–99)
VLDL: 37 mg/dL (ref 0–40)

## 2009-10-10 ENCOUNTER — Telehealth: Payer: Self-pay | Admitting: Internal Medicine

## 2009-10-23 ENCOUNTER — Encounter: Payer: Self-pay | Admitting: Internal Medicine

## 2009-10-23 ENCOUNTER — Ambulatory Visit (HOSPITAL_COMMUNITY): Admission: RE | Admit: 2009-10-23 | Discharge: 2009-10-23 | Payer: Self-pay | Admitting: Internal Medicine

## 2009-10-23 DIAGNOSIS — M8580 Other specified disorders of bone density and structure, unspecified site: Secondary | ICD-10-CM | POA: Insufficient documentation

## 2009-11-04 ENCOUNTER — Encounter: Admission: RE | Admit: 2009-11-04 | Discharge: 2009-11-04 | Payer: Self-pay | Admitting: Internal Medicine

## 2009-11-04 LAB — HM MAMMOGRAPHY

## 2009-11-12 ENCOUNTER — Ambulatory Visit: Payer: Self-pay | Admitting: Critical Care Medicine

## 2009-12-10 ENCOUNTER — Ambulatory Visit: Payer: Self-pay | Admitting: Internal Medicine

## 2009-12-10 DIAGNOSIS — G8929 Other chronic pain: Secondary | ICD-10-CM | POA: Insufficient documentation

## 2009-12-10 DIAGNOSIS — M545 Low back pain, unspecified: Secondary | ICD-10-CM | POA: Insufficient documentation

## 2009-12-10 LAB — CONVERTED CEMR LAB
ALT: 20 units/L (ref 0–35)
Albumin: 4.1 g/dL (ref 3.5–5.2)
Basophils Relative: 0 % (ref 0–1)
CO2: 23 meq/L (ref 19–32)
Calcium: 8.9 mg/dL (ref 8.4–10.5)
Chloride: 92 meq/L — ABNORMAL LOW (ref 96–112)
Glucose, Bld: 108 mg/dL — ABNORMAL HIGH (ref 70–99)
Hemoglobin: 12.7 g/dL (ref 12.0–15.0)
Lymphs Abs: 2.2 10*3/uL (ref 0.7–4.0)
MCHC: 33.5 g/dL (ref 30.0–36.0)
MCV: 96.7 fL (ref >=78.0)
Monocytes Absolute: 0.8 10*3/uL (ref 0.1–1.0)
Monocytes Relative: 10 % (ref 3–12)
Neutro Abs: 5.3 10*3/uL (ref 1.7–7.7)
Neutrophils Relative %: 62 % (ref 43–77)
RBC: 3.92 M/uL (ref 3.87–5.11)
Sodium: 132 meq/L — ABNORMAL LOW (ref 135–145)
Total Bilirubin: 0.3 mg/dL (ref 0.3–1.2)
Total Protein: 6.6 g/dL (ref 6.0–8.3)
WBC: 8.5 10*3/uL (ref 4.0–10.5)

## 2009-12-12 ENCOUNTER — Encounter: Payer: Self-pay | Admitting: Internal Medicine

## 2010-01-12 ENCOUNTER — Encounter: Admission: RE | Admit: 2010-01-12 | Discharge: 2010-04-12 | Payer: Self-pay | Admitting: Internal Medicine

## 2010-01-28 ENCOUNTER — Encounter: Payer: Self-pay | Admitting: Internal Medicine

## 2010-02-04 ENCOUNTER — Ambulatory Visit: Payer: Self-pay | Admitting: Internal Medicine

## 2010-02-12 ENCOUNTER — Telehealth: Payer: Self-pay | Admitting: Internal Medicine

## 2010-02-16 ENCOUNTER — Telehealth: Payer: Self-pay | Admitting: *Deleted

## 2010-03-11 ENCOUNTER — Encounter: Payer: Self-pay | Admitting: Internal Medicine

## 2010-03-13 ENCOUNTER — Ambulatory Visit: Payer: Self-pay | Admitting: Critical Care Medicine

## 2010-04-08 ENCOUNTER — Telehealth: Payer: Self-pay | Admitting: *Deleted

## 2010-04-24 ENCOUNTER — Encounter: Payer: Self-pay | Admitting: Internal Medicine

## 2010-06-03 ENCOUNTER — Telehealth: Payer: Self-pay | Admitting: *Deleted

## 2010-06-10 ENCOUNTER — Telehealth: Payer: Self-pay | Admitting: Internal Medicine

## 2010-08-14 ENCOUNTER — Encounter: Payer: Self-pay | Admitting: Internal Medicine

## 2010-10-06 ENCOUNTER — Telehealth: Payer: Self-pay | Admitting: *Deleted

## 2010-10-28 ENCOUNTER — Ambulatory Visit: Payer: Self-pay | Admitting: Critical Care Medicine

## 2010-10-28 DIAGNOSIS — J309 Allergic rhinitis, unspecified: Secondary | ICD-10-CM | POA: Insufficient documentation

## 2010-10-29 ENCOUNTER — Telehealth: Payer: Self-pay | Admitting: Critical Care Medicine

## 2010-11-04 ENCOUNTER — Inpatient Hospital Stay (HOSPITAL_COMMUNITY)
Admission: EM | Admit: 2010-11-04 | Discharge: 2010-11-06 | Payer: Self-pay | Source: Home / Self Care | Admitting: Emergency Medicine

## 2010-11-04 DIAGNOSIS — A419 Sepsis, unspecified organism: Secondary | ICD-10-CM

## 2010-11-04 DIAGNOSIS — R739 Hyperglycemia, unspecified: Secondary | ICD-10-CM | POA: Insufficient documentation

## 2010-11-04 DIAGNOSIS — N390 Urinary tract infection, site not specified: Secondary | ICD-10-CM

## 2010-11-04 HISTORY — DX: Sepsis, unspecified organism: A41.9

## 2010-11-04 HISTORY — DX: Sepsis, unspecified organism: N39.0

## 2010-11-05 ENCOUNTER — Encounter: Payer: Self-pay | Admitting: Internal Medicine

## 2010-11-05 ENCOUNTER — Encounter (INDEPENDENT_AMBULATORY_CARE_PROVIDER_SITE_OTHER): Payer: Self-pay | Admitting: Emergency Medicine

## 2010-11-05 ENCOUNTER — Ambulatory Visit: Payer: Self-pay | Admitting: Cardiology

## 2010-11-05 LAB — CONVERTED CEMR LAB
Cholesterol: 170 mg/dL
LDL Cholesterol: 89 mg/dL

## 2010-11-06 ENCOUNTER — Encounter: Payer: Self-pay | Admitting: Internal Medicine

## 2010-11-06 DIAGNOSIS — I5030 Unspecified diastolic (congestive) heart failure: Secondary | ICD-10-CM | POA: Insufficient documentation

## 2010-11-06 DIAGNOSIS — N390 Urinary tract infection, site not specified: Secondary | ICD-10-CM | POA: Insufficient documentation

## 2010-11-06 LAB — CONVERTED CEMR LAB
CO2: 30 meq/L
Calcium: 8.4 mg/dL
Chloride: 100 meq/L
Potassium: 3.4 meq/L
Sodium: 138 meq/L

## 2010-11-12 ENCOUNTER — Ambulatory Visit: Payer: Self-pay | Admitting: Internal Medicine

## 2010-11-18 ENCOUNTER — Ambulatory Visit: Payer: Self-pay | Admitting: Internal Medicine

## 2010-11-18 LAB — CONVERTED CEMR LAB: Hgb A1c MFr Bld: 5.5 %

## 2010-11-19 LAB — CONVERTED CEMR LAB
ALT: 13 units/L (ref 0–35)
Albumin: 3.9 g/dL (ref 3.5–5.2)
Basophils Absolute: 0 10*3/uL (ref 0.0–0.1)
CO2: 29 meq/L (ref 19–32)
Calcium: 9 mg/dL (ref 8.4–10.5)
Chloride: 95 meq/L — ABNORMAL LOW (ref 96–112)
Glucose, Bld: 92 mg/dL (ref 70–99)
HCT: 36.1 % (ref 36.0–46.0)
Hemoglobin: 11.9 g/dL — ABNORMAL LOW (ref 12.0–15.0)
Lymphocytes Relative: 28 % (ref 12–46)
Lymphs Abs: 3.1 10*3/uL (ref 0.7–4.0)
Neutro Abs: 7.3 10*3/uL (ref 1.7–7.7)
Platelets: 318 10*3/uL (ref 150–400)
RDW: 13.2 % (ref 11.5–15.5)
Sodium: 135 meq/L (ref 135–145)
Total Protein: 6.5 g/dL (ref 6.0–8.3)
WBC: 11.2 10*3/uL — ABNORMAL HIGH (ref 4.0–10.5)

## 2010-11-20 ENCOUNTER — Telehealth (INDEPENDENT_AMBULATORY_CARE_PROVIDER_SITE_OTHER): Payer: Self-pay | Admitting: *Deleted

## 2010-11-23 ENCOUNTER — Ambulatory Visit: Payer: Self-pay | Admitting: Internal Medicine

## 2010-12-02 ENCOUNTER — Telehealth: Payer: Self-pay | Admitting: Internal Medicine

## 2010-12-26 ENCOUNTER — Encounter: Payer: Self-pay | Admitting: Interventional Radiology

## 2011-01-05 NOTE — Assessment & Plan Note (Signed)
Summary: Pulmonary OV   Primary Provider/Referring Provider:  Margarito Liner MD  CC:  4 month COPD follow up.  Pt states breathing is slightly worse-having increased SOB and dry cough x 1 month.  Also have "a little wheezing" x 2wks.  Denies chest tightness.  Jennifer [name]  History of Present Illness: This is a 70 year old, white female with chronic obstructive lung disease, asthmatic bronchitic component.    May 14, 2009 3:09 PM Off cigs since 1/10.  Has dry cough and gets hoarse.  No wheeze.  No chestpain. Pt denies any significant sore throat, nasal congestion or excess secretions, fever, chills, sweats, unintended weight loss, pleurtic or exertional chest pain, orthopnea PND, or leg swelling Pt denies any increase in rescue therapy over baseline, denies waking up needing it or having any early am or nocturnal exacerbations of coughing/wheezing/or dyspnea.  November 12, 2009 12:04 PM Still off cigarettes. Still with dry cough at night.  No mucous is seen.  CPAP has humidifier.   No heartburn.  Still hoarseness.   Pt denies any significant sore throat, nasal congestion or excess secretions, fever, chills, sweats, unintended weight loss, pleurtic or exertional chest pain, orthopnea PND, or leg swelling Pt denies any increase in rescue therapy over baseline, denies waking up needing it or having any early am or nocturnal exacerbations of coughing/wheezing/or dyspnea.  March 13, 2010 2:59 PM Since last ov more dyspneic,  but goes to PT for more mobility.  Notes sl amount of wheezing for one month and throat  is dry.  No heartburn.  Some cough at night.  No sore throat.  Some pndrip.  Nose is stopped up.  Difficulty with cpap mask.    Preventive Screening-Counseling & Management  Alcohol-Tobacco     Smoking Status: quit > 6 months  Current Medications (verified): 1)  Vicodin Es 7.5-750 Mg Tabs (Hydrocodone-Acetaminophen) .... Take 1 Tablet By Mouth Four Times A Day As Needed For Pain 2)  Spiriva  Handihaler 18 Mcg Caps (Tiotropium Bromide Monohydrate) .... Inhale Contents of 1 Capsule Once A Day 3)  Advair Diskus 250-50 Mcg/dose Misc (Fluticasone-Salmeterol) .... Inhale 1 Puff Two Times A Day 4)  Soma 350 Mg Tabs (Carisoprodol) .... Take 1 Tablet By Mouth Four Times A Day As Needed For Muscle Spasm 5)  Zoloft 100 Mg Tabs (Sertraline Hcl) .... Take 1 Tablet By Mouth Two Times A Day 6)  Alprazolam 0.5 Mg Tabs (Alprazolam) .... Take 1 To 11/2  Tablets By Mouth Three Times A Day As Needed For Anxiety 7)  Procardia Xl 90 Mg Tb24 (Nifedipine) .... Take 1 Tablet By Mouth Once A Day 8)  Klor-Con 10 10 Meq Cr-Tabs (Potassium Chloride) .... Take 2 Tablets By Mouth Once A Day 9)  Amitriptyline Hcl 150 Mg Tabs (Amitriptyline Hcl) .... Take 1 Tablet By Mouth Once A Day 10)  Fluticasone Propionate 0.05 % Crea (Fluticasone Propionate) .... Take 2 Sprays in Each Nostril Once A Day 11)  Proair Hfa 108 (90 Base) Mcg/act Aers (Albuterol Sulfate) .... Inhale 2 Puffs Four Times A Day As Needed 12)  Lidoderm 5 % Ptch (Lidocaine) .... Apply 1 Patch To Skin Once Daily As Directed To Affected Area; Leave On For No More Than 12 Hours Every Day 13)  Aspirin 81 Mg Tbec (Aspirin) .... Take 1 Tablet By Mouth Once A Day 14)  Ibuprofen 200 Mg Tabs (Ibuprofen) .... Take 2 Tablets By Mouth Two Times A Day As Needed For Pain 15)  Hydrochlorothiazide 25  Mg  Tabs (Hydrochlorothiazide) .... Take 1 Tablet By Mouth Once A Day 16)  Antivert 12.5 Mg Tabs (Meclizine Hcl) .... Take 1 Tablet By Mouth Two Times A Day As Needed For Vertigo 17)  Red Yeast Rice Extract 600 Mg Caps (Red Yeast Rice Extract) .... Take 1 Tablet By Mouth Once A Day 18)  Mucinex 600 Mg Tb12 (Guaifenesin) .... Take 1 Tablet By Mouth Two Times A Day 19)  Oxygen .... 2l At Bedtime 20)  Cpap .... Resmed Quattro Medium Full-Face Mask At 17 Cwp With Heated Humidifier and 2 Lpm Supplemental Oxygen.  Dx: Severe Obstructive Sleep Apnea (See Attached Sleep Study). 21)   Nu-Iron 150 Mg  Caps (Polysaccharide Iron Complex) .... Take 1 Tablet Twice A Day With Meals. 22)  Omeprazole 20 Mg  Cpdr (Omeprazole) .... Take 1 Capsule By Mouth Once A Day 23)  Magnesium 500 Mg Tabs (Magnesium) .... Once Daily 24)  Calcium Carbonate-Vitamin D 600-400 Mg-Unit Tabs (Calcium Carbonate-Vitamin D) .... Take 1 Tablet By Mouth Two Times A Day  Allergies (verified): 1)  ! Tetracycline  Past History:  Past medical, surgical, family and social histories (including risk factors) reviewed, and no changes noted (except as noted below).  Past Medical History: Reviewed history from 09/20/2008 and no changes required. COPD- on Home oxygen Hypertension Obesity Fibromyalgia Osteoarthritis Anxiety Pneumonia GERD OSA  Past Surgical History: Reviewed history from 03/22/2007 and no changes required. 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 Corliss Skains on November 10, 2004 Hysterectomy 1975  Family History: Reviewed history from 02/22/2008 and no changes required. non contrib.  Social History: Reviewed history from 09/12/2008 and no changes required. Widow/Widower Current Smoker Alcohol use-no  Review of Systems       The patient complains of shortness of breath with activity, non-productive cough, nasal congestion/difficulty breathing through nose, and sneezing.  The patient denies shortness of breath at rest, productive cough, coughing up blood, chest pain, irregular heartbeats, acid heartburn, indigestion, loss of appetite, weight change, abdominal pain, difficulty swallowing, sore throat, tooth/dental problems, headaches, itching, ear ache, anxiety, depression, hand/feet swelling, joint stiffness or pain, rash, change in color of mucus, and fever.    Vital Signs:  Patient profile:   70 year old female Height:      63 inches Weight:      259.13 pounds BMI:     46.07 O2 Sat:      94 % on Room air Temp:      98.3 degrees F oral Pulse rate:   84 / minute BP sitting:   136 / 88  (right arm) Cuff size:   large  Vitals Entered By: Gweneth Dimitri RN (March 13, 2010 2:45 PM)  O2 Flow:  Room air CC: 4 month COPD follow up.  Pt states breathing is slightly worse-having increased SOB and dry cough x 1 month.  Also have "a little wheezing" x 2wks.  Denies chest tightness.   Comments Medications reviewed with patient Daytime contact number verified with patient. Gweneth Dimitri RN  March 13, 2010 2:42 PM    Physical Exam  Additional Exam:  Gen: WD WN      WF    in NAD    NCAT Heent:  no jvd, no TMG, no cervical LNademopathy, orophyx clear,  nares with clear watery drainage. Cor: RRR nl s1/s2  no s3/s4  no m r h g Abd: soft NT BSA   no masses  No HSM  no rebound or guarding Ext perfused with no c v e v.d Neuro: intact, moves all 4s, CN II-XII intact, DTRs intact Chest: distant BS  no wheezes, rales, rhonchi   no egophony  no consolidative breath sounds, mild hyperresonance to percussion Skin: clear  Genital/Rectal :deferred    Impression & Recommendations:  Problem # 1:  COPD (ICD-496) Assessment Unchanged stable copd plan No change in inhaled medications.   Maintain treatment program as currently prescribed.  Complete Medication List: 1)  Vicodin Es 7.5-750 Mg Tabs (Hydrocodone-acetaminophen) .... Take 1 tablet by mouth four times a day as needed for pain 2)  Spiriva Handihaler 18 Mcg Caps (Tiotropium bromide monohydrate) .... Inhale contents of 1 capsule once a day 3)  Advair Diskus 250-50 Mcg/dose Misc (Fluticasone-salmeterol) .... Inhale 1 puff two times a day 4)  Soma 350 Mg Tabs (Carisoprodol) .... Take 1 tablet by mouth four times a day as needed for muscle spasm 5)  Zoloft 100 Mg Tabs (Sertraline hcl) .... Take 1 tablet by mouth two times a day 6)  Alprazolam 0.5 Mg Tabs (Alprazolam) .... Take 1 to 11/2  tablets by mouth three times a day as needed for anxiety 7)  Procardia Xl 90 Mg  Tb24 (Nifedipine) .... Take 1 tablet by mouth once a day 8)  Klor-con 10 10 Meq Cr-tabs (Potassium chloride) .... Take 2 tablets by mouth once a day 9)  Amitriptyline Hcl 150 Mg Tabs (Amitriptyline hcl) .... Take 1 tablet by mouth once a day 10)  Fluticasone Propionate 0.05 % Crea (Fluticasone propionate) .... Take 2 sprays in each nostril once a day 11)  Proair Hfa 108 (90 Base) Mcg/act Aers (Albuterol sulfate) .... Inhale 2 puffs four times a day as needed 12)  Lidoderm 5 % Ptch (Lidocaine) .... Apply 1 patch to skin once daily as directed to affected area; leave on for no more than 12 hours every day 13)  Aspirin 81 Mg Tbec (Aspirin) .... Take 1 tablet by mouth once a day 14)  Ibuprofen 200 Mg Tabs (Ibuprofen) .... Take 2 tablets by mouth two times a day as needed for pain 15)  Hydrochlorothiazide 25 Mg Tabs (Hydrochlorothiazide) .... Take 1 tablet by mouth once a day 16)  Antivert 12.5 Mg Tabs (Meclizine hcl) .... Take 1 tablet by mouth two times a day as needed for vertigo 17)  Red Yeast Rice Extract 600 Mg Caps (Red yeast rice extract) .... Take 1 tablet by mouth once a day 18)  Mucinex 600 Mg Tb12 (Guaifenesin) .... Take 1 tablet by mouth two times a day 19)  Oxygen  .... 2l at bedtime 20)  Cpap  .... Resmed quattro medium full-face mask at 17 cwp with heated humidifier and 2 lpm supplemental oxygen.  dx: severe obstructive sleep apnea (see attached sleep study). 21)  Nu-iron 150 Mg Caps (Polysaccharide iron complex) .... Take 1 tablet twice a day with meals. 22)  Omeprazole 20 Mg Cpdr (Omeprazole) .... Take 1 capsule by mouth once a day 23)  Magnesium 500 Mg Tabs (Magnesium) .... Once daily 24)  Calcium Carbonate-vitamin D 600-400 Mg-unit Tabs (Calcium carbonate-vitamin d) .... Take 1 tablet by mouth two times a day  Other Orders: Est. Patient Level III (16109)  Patient Instructions: 1)  No change in medications 2)  Use the flonase two sprays each nostril daily 3)  Return 4-5 months

## 2011-01-05 NOTE — Progress Notes (Signed)
Summary: Refill Alprazolam  Phone Note Refill Request Message from:  Pharmacy  Refills Requested: Medication #1:  ALPRAZOLAM 0.5 MG TABS Take 1 to 11/2  tablets by mouth three times a day as needed for anxiety   Last Refilled: 10/04/2010 Electronic refill request received.  Initial call taken by: Margarito Liner MD,  October 06, 2010 6:57 PM  Follow-up for Phone Call        Refill approved-nurse to complete. Follow-up by: Margarito Liner MD,  October 06, 2010 6:57 PM  Additional Follow-up for Phone Call Additional follow up Details #1::        Rx called to pharmacy Additional Follow-up by: Marin Roberts RN,  October 07, 2010 9:02 AM    Prescriptions: ALPRAZOLAM 0.5 MG TABS (ALPRAZOLAM) Take 1 to 11/2  tablets by mouth three times a day as needed for anxiety  #120 x 3   Entered and Authorized by:   Margarito Liner MD   Signed by:   Margarito Liner MD on 10/06/2010   Method used:   Telephoned to ...       [facility] [address] [id number]* (retail)       [address] [place], [place]  [address]       Ph: [phone]       Fax: [phone]   RxID:   [id number]

## 2011-01-05 NOTE — Assessment & Plan Note (Signed)
Summary: EST-CK/FU/MEDS/CFB   Vital Signs:  Patient profile:   70 year old female Height:      63 inches Weight:      258.5 pounds BMI:     45.96 Temp:     97.0 degrees F oral Pulse rate:   80 / minute BP sitting:   120 / 70  (right arm)  Vitals Entered By: Filomena Jungling NT II (December 10, 2009 12:22 PM) CC: checkup, Depression Is Patient Diabetic? No Pain Assessment Patient in pain? yes     Location: joints Intensity: 9 Type: aching Onset of pain  Chronic Nutritional Status BMI of > 30 = obese  Does patient need assistance? Functional Status Self care Ambulation Wheelchair   Primary Care Provider:  Margarito Liner MD  CC:  checkup and Depression.  History of Present Illness: Patient returns for follow-up of her COPD, obstructive sleep apnea, hypertension, and other chronic medical problems.  Her main complaint is of increased generalized pain recently, and chronic low back pain.  She reports that she is compliant with her medications.   Depression History:      The patient denies a depressed mood most of the day and a diminished interest in her usual daily activities.         Preventive Screening-Counseling & Management  Alcohol-Tobacco     Alcohol drinks/day: 0     Smoking Status: quit > 6 months     Smoking Cessation Counseling: yes     Packs/Day: 1/2     Year Started: 2008     Year Quit: 12/2008     Pack years: 41  Caffeine-Diet-Exercise     Does Patient Exercise: yes     Type of exercise: walking (about 15-22mins)     Times/week: 1  Bone Density  Procedure date:  10/23/2009  Findings:      Osteopenia. Lumbar spine T score -1.3 Left femur neck T score -1.8 Right femur neck T score -0.8  Current Medications (verified): 1)  Vicodin Es 7.5-750 Mg Tabs (Hydrocodone-Acetaminophen) .... Take 1 Tablet By Mouth Four Times A Day As Needed For Pain 2)  Spiriva Handihaler 18 Mcg Caps (Tiotropium Bromide Monohydrate) .... Inhale Contents of 1 Capsule Once A  Day 3)  Advair Diskus 250-50 Mcg/dose Misc (Fluticasone-Salmeterol) .... Inhale 1 Puff Two Times A Day 4)  Soma 350 Mg Tabs (Carisoprodol) .... Take 1 Tablet By Mouth Four Times A Day As Needed For Muscle Spasm 5)  Zoloft 100 Mg Tabs (Sertraline Hcl) .... Take 1 Tablet By Mouth Two Times A Day 6)  Alprazolam 0.5 Mg Tabs (Alprazolam) .... Take 1 To 11/2  Tablets By Mouth Three Times A Day As Needed For Anxiety 7)  Procardia Xl 90 Mg Tb24 (Nifedipine) .... Take 1 Tablet By Mouth Once A Day 8)  Klor-Con 10 10 Meq Cr-Tabs (Potassium Chloride) .... Take 2 Tablets By Mouth Once A Day 9)  Amitriptyline Hcl 150 Mg Tabs (Amitriptyline Hcl) .... Take 1 Tablet By Mouth Once A Day 10)  Fluticasone Propionate 0.05 % Crea (Fluticasone Propionate) .... Take 2 Sprays in Each Nostril Once A Day 11)  Proair Hfa 108 (90 Base) Mcg/act Aers (Albuterol Sulfate) .... Inhale 2 Puffs Four Times A Day As Needed 12)  Lidoderm 5 % Ptch (Lidocaine) .... Apply 1 Patch To Skin Once Daily As Directed To Affected Area; Leave On For No More Than 12 Hours Every Day 13)  Aspirin 81 Mg Tbec (Aspirin) .... Take 1 Tablet By Mouth  Once A Day 14)  Ibuprofen 200 Mg Tabs (Ibuprofen) .... Take 2 Tablets By Mouth Two Times A Day As Needed For Pain 15)  Hydrochlorothiazide 25 Mg  Tabs (Hydrochlorothiazide) .... Take 1 Tablet By Mouth Once A Day 16)  Antivert 12.5 Mg Tabs (Meclizine Hcl) .... Take 1 Tablet By Mouth Two Times A Day As Needed For Vertigo 17)  Red Yeast Rice Extract 600 Mg Caps (Red Yeast Rice Extract) .... Take 1 Tablet By Mouth Once A Day 18)  Mucinex 600 Mg Tb12 (Guaifenesin) .... Take 1 Tablet By Mouth Two Times A Day 19)  Oxygen .... 2l At Bedtime 20)  Cpap .... Resmed Quattro Medium Full-Face Mask At 17 Cwp With Heated Humidifier and 2 Lpm Supplemental Oxygen.  Dx: Severe Obstructive Sleep Apnea (See Attached Sleep Study). 21)  Nu-Iron 150 Mg  Caps (Polysaccharide Iron Complex) .... Take 1 Tablet Twice A Day With  Meals. 22)  Omeprazole 20 Mg  Cpdr (Omeprazole) .... Take 1 Capsule By Mouth Once A Day 23)  Magnesium 500 Mg Tabs (Magnesium) .... Once Daily 24)  Calcium Carbonate-Vitamin D 600-400 Mg-Unit Tabs (Calcium Carbonate-Vitamin D) .... Take 1 Tablet By Mouth Two Times A Day  Allergies (verified): 1)  ! Tetracycline  Physical Exam  General:  alert, no distress Lungs:  normal respiratory effort, normal breath sounds, no crackles, and no wheezes.   Heart:  normal rate, regular rhythm, no murmur, no gallop, and no rub.   Extremities:  trace bilateral ankle edema   Impression & Recommendations:  Problem # 1:  BACK PAIN, CHRONIC (ICD-724.5) The patient has chronic low back pain with recent exacerbation.  She is on an extensive pain regimen.  The plan is to refer to physical therapy.  Her updated medication list for this problem includes:    Vicodin Es 7.5-750 Mg Tabs (Hydrocodone-acetaminophen) .Marland Kitchen... Take 1 tablet by mouth four times a day as needed for pain    Soma 350 Mg Tabs (Carisoprodol) .Marland Kitchen... Take 1 tablet by mouth four times a day as needed for muscle spasm    Aspirin 81 Mg Tbec (Aspirin) .Marland Kitchen... Take 1 tablet by mouth once a day    Ibuprofen 200 Mg Tabs (Ibuprofen) .Marland Kitchen... Take 2 tablets by mouth two times a day as needed for pain  Orders: Physical Therapy Referral (PT) T-Comprehensive Metabolic Panel ([id number]) T-CBC w/Diff ([id number])  Problem # 2:  HYPERTENSION (ICD-401.9) Patient's blood pressure is well controlled on current regimen.  Plan is to continue current antihypertensive medications.  Her updated medication list for this problem includes:    Procardia Xl 90 Mg Tb24 (Nifedipine) .Marland Kitchen... Take 1 tablet by mouth once a day    Hydrochlorothiazide 25 Mg Tabs (Hydrochlorothiazide) .Marland Kitchen... Take 1 tablet by mouth once a day  BP today: 120/70 Prior BP: 134/86 (11/12/2009)  Prior 10 Yr Risk Heart Disease: 13 % (09/02/2009)  Labs Reviewed: K+: 3.2 (09/24/2009) Creat: : 0.70  (09/24/2009)   Chol: 247 (09/24/2009)   HDL: 67 (09/24/2009)   LDL: 143 (09/24/2009)   TG: 184 (09/24/2009)  Problem # 3:  COPD (ICD-496) Patient's respiratory status is stable on current regimen; will continue as below.  Her updated medication list for this problem includes:    Spiriva Handihaler 18 Mcg Caps (Tiotropium bromide monohydrate) ..... Inhale contents of 1 capsule once a day    Advair Diskus 250-50 Mcg/dose Misc (Fluticasone-salmeterol) ..... Inhale 1 puff two times a day    Proair Hfa 108 (90 Base) Mcg/act Aers (Albuterol  sulfate) ..... Inhale 2 puffs four times a day as needed  Problem # 4:  SLEEP APNEA, OBSTRUCTIVE (ICD-327.23) Patient reports that she is using her CPAP without problems.  Problem # 5:  DISORDER, DEPRESSIVE NEC (ICD-311) Patient reports that she is doing well on her current regimen.  Her updated medication list for this problem includes:    Zoloft 100 Mg Tabs (Sertraline hcl) .Marland Kitchen... Take 1 tablet by mouth two times a day    Alprazolam 0.5 Mg Tabs (Alprazolam) .Marland Kitchen... Take 1 to 11/2  tablets by mouth three times a day as needed for anxiety    Amitriptyline Hcl 150 Mg Tabs (Amitriptyline hcl) .Marland Kitchen... Take 1 tablet by mouth once a day  Problem # 6:  DYSLIPIDEMIA (ICD-272.4) Patient has been intolerant of multiple statin medications in the past.  She currently does not wish to try an alternative prescription medication.  Labs Reviewed: SGOT: 21 (01/22/2009)   SGPT: 19 (01/22/2009)  Lipid Goals: Chol Goal: 200 (09/02/2009)   HDL Goal: 40 (09/02/2009)   LDL Goal: 130 (09/02/2009)   TG Goal: 150 (09/02/2009)  Prior 10 Yr Risk Heart Disease: 13 % (09/02/2009)   HDL:67 (09/24/2009), 56 (09/12/2008)  LDL:143 (09/24/2009), 122 (09/12/2008)  Chol:247 (09/24/2009), 232 (09/12/2008)  Trig:184 (09/24/2009), 268 (09/12/2008)  Problem # 7:  OSTEOPENIA (ICD-733.90) Given patient's FRAX results regarding risk of fracture, plan is to start supplementation with calcium and  vitamin D as per current guidelines.  Complete Medication List: 1)  Vicodin Es 7.5-750 Mg Tabs (Hydrocodone-acetaminophen) .... Take 1 tablet by mouth four times a day as needed for pain 2)  Spiriva Handihaler 18 Mcg Caps (Tiotropium bromide monohydrate) .... Inhale contents of 1 capsule once a day 3)  Advair Diskus 250-50 Mcg/dose Misc (Fluticasone-salmeterol) .... Inhale 1 puff two times a day 4)  Soma 350 Mg Tabs (Carisoprodol) .... Take 1 tablet by mouth four times a day as needed for muscle spasm 5)  Zoloft 100 Mg Tabs (Sertraline hcl) .... Take 1 tablet by mouth two times a day 6)  Alprazolam 0.5 Mg Tabs (Alprazolam) .... Take 1 to 11/2  tablets by mouth three times a day as needed for anxiety 7)  Procardia Xl 90 Mg Tb24 (Nifedipine) .... Take 1 tablet by mouth once a day 8)  Klor-con 10 10 Meq Cr-tabs (Potassium chloride) .... Take 2 tablets by mouth once a day 9)  Amitriptyline Hcl 150 Mg Tabs (Amitriptyline hcl) .... Take 1 tablet by mouth once a day 10)  Fluticasone Propionate 0.05 % Crea (Fluticasone propionate) .... Take 2 sprays in each nostril once a day 11)  Proair Hfa 108 (90 Base) Mcg/act Aers (Albuterol sulfate) .... Inhale 2 puffs four times a day as needed 12)  Lidoderm 5 % Ptch (Lidocaine) .... Apply 1 patch to skin once daily as directed to affected area; leave on for no more than 12 hours every day 13)  Aspirin 81 Mg Tbec (Aspirin) .... Take 1 tablet by mouth once a day 14)  Ibuprofen 200 Mg Tabs (Ibuprofen) .... Take 2 tablets by mouth two times a day as needed for pain 15)  Hydrochlorothiazide 25 Mg Tabs (Hydrochlorothiazide) .... Take 1 tablet by mouth once a day 16)  Antivert 12.5 Mg Tabs (Meclizine hcl) .... Take 1 tablet by mouth two times a day as needed for vertigo 17)  Red Yeast Rice Extract 600 Mg Caps (Red yeast rice extract) .... Take 1 tablet by mouth once a day 18)  Mucinex 600 Mg Tb12 (  Guaifenesin) .... Take 1 tablet by mouth two times a day 19)  Oxygen   .... 2l at bedtime 20)  Cpap  .... Resmed quattro medium full-face mask at 17 cwp with heated humidifier and 2 lpm supplemental oxygen.  dx: severe obstructive sleep apnea (see attached sleep study). 21)  Nu-iron 150 Mg Caps (Polysaccharide iron complex) .... Take 1 tablet twice a day with meals. 22)  Omeprazole 20 Mg Cpdr (Omeprazole) .... Take 1 capsule by mouth once a day 23)  Magnesium 500 Mg Tabs (Magnesium) .... Once daily 24)  Calcium Carbonate-vitamin D 600-400 Mg-unit Tabs (Calcium carbonate-vitamin d) .... Take 1 tablet by mouth two times a day  Patient Instructions: 1)  Please schedule a follow-up appointment in 3 months. 2)  Start calcium with vitamin D two times a day as prescribed.  Prescriptions: CALCIUM CARBONATE-VITAMIN D 600-400 MG-UNIT TABS (CALCIUM CARBONATE-VITAMIN D) Take 1 tablet by mouth two times a day  #60 x 11   Entered and Authorized by:   Margarito Liner MD   Signed by:   Margarito Liner MD on 12/10/2009   Method used:   Electronically to        [facility] [address] #[id number]* (retail)       [address] [place], [place]  [address]       Ph: [phone]       Fax: [phone]   RxID:   [id number] AMITRIPTYLINE HCL 150 MG TABS (AMITRIPTYLINE HCL) Take 1 tablet by mouth once a day  #31 x 6   Entered and Authorized by:   Margarito Liner MD   Signed by:   Margarito Liner MD on 12/10/2009   Method used:   Electronically to        [facility] #[id number]* (retail)       [address] [place], [place]  [address]       Ph: [phone]       Fax: [phone]   RxID:   [id number] ANTIVERT 12.5 MG TABS (MECLIZINE HCL) Take 1 tablet by mouth two times a day as needed for vertigo  #60 x 6   Entered and Authorized by:   Margarito Liner MD   Signed by:   Margarito Liner MD on 12/10/2009   Method used:   Electronically to        [facility] [address] #[id number]* (retail)       [address] [place], [place]  [address]       Ph: [phone]       Fax:  [phone]   RxID:   [id number] VICODIN ES 7.5-750 MG TABS (HYDROCODONE-ACETAMINOPHEN) Take 1 tablet by mouth four times a day as needed for pain  #120 x 3   Entered and Authorized by:   Margarito Liner MD   Signed by:   Margarito Liner MD on 12/10/2009   Method used:   Print then Give to Patient   RxID:   [id number]   Prevention & Chronic Care Immunizations   Influenza vaccine: Fluvax MCR  (09/02/2009)    Tetanus booster: Not documented   Td booster deferral: Deferred  (09/02/2009)    Pneumococcal vaccine: Pneumovax  (11/12/2009)   Pneumococcal vaccine deferral: Deferred  (09/02/2009)    H. zoster vaccine: Not documented  Colorectal Screening   Hemoccult: Not documented   Hemoccult action/deferral: Deferred  (09/02/2009)    Colonoscopy: Impression:  Diverticulosis in the sigmoid colon and in the descending colon. Two 4 to 6 mm polyps in the sigmoid colon.  Resected and retrieved. Exam by: Everardo All. Madilyn Fireman, MD, [facility]  Pathology: Hyperplastic polyps, no adenomatous change or malignancy identified.  (07/17/2008)   Colonoscopy due: 07/17/2013  Other Screening   Pap smear: Not documented   Pap smear action/deferral: Not indicated S/P hysterectomy  (06/20/2009)    Mammogram: BI-RADS CATEGORY 2:  Benign finding(s).^MM DIGITAL DIAGNOSTIC BILAT LTD  (11/04/2009)   Mammogram due: 10/22/2010    DXA bone density scan: Osteopenia. Lumbar spine T score -1.3 Left femur neck T score -1.8 Right femur neck T score -0.8  (10/23/2009)   DXA bone density action/deferral: Ordered  (09/02/2009)   Smoking status: quit > 6 months  (12/10/2009)  Lipids   Total Cholesterol: 247  (09/24/2009)   Lipid panel action/deferral: Lipid Panel ordered   LDL: 143  (09/24/2009)   LDL Direct: Not documented   HDL: 67  (09/24/2009)   Triglycerides: 184  (09/24/2009)    SGOT (AST): 21  (01/22/2009)   SGPT (ALT): 19  (01/22/2009) CMP ordered    Alkaline phosphatase: 73   (01/22/2009)   Total bilirubin: 0.3  (01/22/2009)    Lipid flowsheet reviewed?: Yes   Progress toward LDL goal: Unchanged  Hypertension   Last Blood Pressure: 120 / 70  (12/10/2009)   Serum creatinine: 0.70  (09/24/2009)   Serum potassium 3.2  (09/24/2009) CMP ordered     Hypertension flowsheet reviewed?: Yes   Progress toward BP goal: At goal  Self-Management Support :   Personal Goals (by the next clinic visit) :      Personal blood pressure goal: 140/90  (09/02/2009)     Personal LDL goal: 130  (09/02/2009)    Patient will work on the following items until the next clinic visit to reach self-care goals:     Medications and monitoring: take my medicines every day, bring all of my medications to every visit  (12/10/2009)     Eating: drink diet soda or water instead of juice or soda, eat more vegetables, use fresh or frozen vegetables, eat foods that are low in salt, eat baked foods instead of fried foods  (12/10/2009)    Hypertension self-management support: Written self-care plan  (12/10/2009)   Hypertension self-care plan printed.    Lipid self-management support: Written self-care plan  (12/10/2009)   Lipid self-care plan printed.   Process Orders Check Orders Results:     [facility]: Check successful Tests Sent for requisitioning (December 13, 2009 1:57 PM):     12/10/2009: [facility] -- T-Comprehensive Metabolic Panel [[id number]] (signed)     12/10/2009: [facility] -- [facility] w/Diff [[id number]] (signed)

## 2011-01-05 NOTE — Miscellaneous (Signed)
Summary: [facility]  [facility]   Imported By: Shon Hough 08/20/2010 16:41:53  _____________________________________________________________________  External Attachment:    Type:   Image     Comment:   External Document

## 2011-01-05 NOTE — Progress Notes (Signed)
Summary: Refill/gh  Phone Note Refill Request Message from:  Fax from Pharmacy on July 04, 2009 10:56 AM  Refills Requested: Medication #1:  LIDODERM 5 % PTCH Apply 1 patch to skin once daily as directed to affected area; leave on for no more than 12 hours every day  Method Requested: Electronic Initial call taken by: Angelina Ok RN,  July 04, 2009 10:57 AM    Prescriptions: LIDODERM 5 % PTCH (LIDOCAINE) Apply 1 patch to skin once daily as directed to affected area; leave on for no more than 12 hours every day  #30 x 3   Entered and Authorized by:   Margarito Liner MD   Signed by:   Margarito Liner MD on 07/04/2009   Method used:   Electronically to        [facility] #[id number]* (retail)       [address] [place], [place]  [address]       Ph: [phone]       Fax: [phone]   RxID:   [id number]

## 2011-01-05 NOTE — Assessment & Plan Note (Signed)
Summary: F/U/EST/VS   Vital Signs:  Patient profile:   70 year old female Height:      63 inches Weight:      258.6 pounds BMI:     45.97 Temp:     97.3 degrees F oral Pulse rate:   87 / minute BP sitting:   138 / 87  (right arm)  Vitals Entered By: Filomena Jungling NT II (February 04, 2010 10:58 AM) CC: check-up/ , Depression Pain Assessment Patient in pain? yes     Location: whole body Intensity: 8 Type: aching  Does patient need assistance? Functional Status Self care Ambulation Normal   Primary Care Provider:  Margarito Liner MD  CC:  check-up/  and Depression.  History of Present Illness: Patient returns for follow-up of her COPD, obstructive sleep apnea, hypertension, and other chronic medical problems.  She reports that she is doing well overall, and says that she is compliant with her medications.  She denies any depressive symptoms.  Her pain is reasonably well controlled on her current medication regimen.     Depression History:      The patient denies a depressed mood most of the day and a diminished interest in her usual daily activities.         Preventive Screening-Counseling & Management  Alcohol-Tobacco     Alcohol drinks/day: 0     Smoking Status: quit > 6 months     Smoking Cessation Counseling: yes     Packs/Day: 1/2     Year Started: 2008     Year Quit: 12/2008     Pack years: 91  Caffeine-Diet-Exercise     Does Patient Exercise: yes     Type of exercise: walking (about 15-58mins)     Times/week: 1  Allergies: 1)  ! Tetracycline  Review of Systems General:  Denies chills and fever. CV:  Denies chest pain or discomfort and swelling of feet. Resp:  Denies shortness of breath. GI:  Denies nausea and vomiting. Psych:  Denies anxiety, depression, and suicidal thoughts/plans.  Physical Exam  General:  alert, no distress Lungs:  normal respiratory effort, normal breath sounds, no crackles, and no wheezes.   Heart:  normal rate, regular  rhythm, no murmur, no gallop, and no rub.   Extremities:  no edema   Impression & Recommendations:  Problem # 1:  HYPERTENSION (ICD-401.9) Patient's blood pressure is at goal on current regimen.  Plan is to continue current antihypertensive medications.   Her updated medication list for this problem includes:    Procardia Xl 90 Mg Tb24 (Nifedipine) .Marland Kitchen... Take 1 tablet by mouth once a day    Hydrochlorothiazide 25 Mg Tabs (Hydrochlorothiazide) .Marland Kitchen... Take 1 tablet by mouth once a day  BP today: 138/87 Prior BP: 120/70 (12/10/2009)  Prior 10 Yr Risk Heart Disease: 13 % (09/02/2009)  Labs Reviewed: K+: 3.7 (12/10/2009) Creat: : 0.53 (12/10/2009)   Chol: 247 (09/24/2009)   HDL: 67 (09/24/2009)   LDL: 143 (09/24/2009)   TG: 184 (09/24/2009)  Problem # 2:  SLEEP APNEA, OBSTRUCTIVE (ICD-327.23) Patient reports that she is compliant with her CPAP and is doing well without any problems.  Problem # 3:  COPD (ICD-496) Patient's respiratory status is stable on current medication regimen.  Her updated medication list for this problem includes:    Spiriva Handihaler 18 Mcg Caps (Tiotropium bromide monohydrate) ..... Inhale contents of 1 capsule once a day    Advair Diskus 250-50 Mcg/dose Misc (Fluticasone-salmeterol) ..... Inhale 1  puff two times a day    Proair Hfa 108 (90 Base) Mcg/act Aers (Albuterol sulfate) ..... 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  Her updated medication list for this problem includes:    Vicodin Es 7.5-750 Mg Tabs (Hydrocodone-acetaminophen) .Marland Kitchen... Take 1 tablet by mouth four times a day as needed for pain    Aspirin 81 Mg Tbec (Aspirin) .Marland Kitchen... Take 1 tablet by mouth once a day    Ibuprofen 200 Mg Tabs (Ibuprofen) .Marland Kitchen... Take 2 tablets by mouth two times a day as needed for pain  Problem # 5:  GERD  (ICD-530.81) Symptoms are well controlled on omeprazole 20 mg daily.  Her updated medication list for this problem includes:    Omeprazole 20 Mg Cpdr (Omeprazole) .Marland Kitchen... Take 1 capsule by mouth once a day  Complete Medication List: 1)  Vicodin Es 7.5-750 Mg Tabs (Hydrocodone-acetaminophen) .... Take 1 tablet by mouth four times a day as needed for pain 2)  Spiriva Handihaler 18 Mcg Caps (Tiotropium bromide monohydrate) .... Inhale contents of 1 capsule once a day 3)  Advair Diskus 250-50 Mcg/dose Misc (Fluticasone-salmeterol) .... Inhale 1 puff two times a day 4)  Soma 350 Mg Tabs (Carisoprodol) .... Take 1 tablet by mouth four times a day as needed for muscle spasm 5)  Zoloft 100 Mg Tabs (Sertraline hcl) .... Take 1 tablet by mouth two times a day 6)  Alprazolam 0.5 Mg Tabs (Alprazolam) .... Take 1 to 11/2  tablets by mouth three times a day as needed for anxiety 7)  Procardia Xl 90 Mg Tb24 (Nifedipine) .... Take 1 tablet by mouth once a day 8)  Klor-con 10 10 Meq Cr-tabs (Potassium chloride) .... Take 2 tablets by mouth once a day 9)  Amitriptyline Hcl 150 Mg Tabs (Amitriptyline hcl) .... Take 1 tablet by mouth once a day 10)  Fluticasone Propionate 0.05 % Crea (Fluticasone propionate) .... Take 2 sprays in each nostril once a day 11)  Proair Hfa 108 (90 Base) Mcg/act Aers (Albuterol sulfate) .... Inhale 2 puffs four times a day as needed 12)  Lidoderm 5 % Ptch (Lidocaine) .... Apply 1 patch to skin once daily as directed to affected area; leave on for no more than 12 hours every day 13)  Aspirin 81 Mg Tbec (Aspirin) .... Take 1 tablet by mouth once a day 14)  Ibuprofen 200 Mg Tabs (Ibuprofen) .... Take 2 tablets by mouth two times a day as needed for pain 15)  Hydrochlorothiazide 25 Mg Tabs (Hydrochlorothiazide) .... Take 1 tablet by mouth once a day 16)  Antivert 12.5 Mg Tabs (Meclizine hcl) .... Take 1 tablet by mouth two times a day as needed for vertigo 17)  Red Yeast Rice Extract 600 Mg  Caps (Red yeast rice extract) .... Take 1 tablet by mouth once a day 18)  Mucinex 600 Mg Tb12 (Guaifenesin) .... Take 1 tablet by mouth two times a day 19)  Oxygen  .... 2l at bedtime 20)  Cpap  .... Resmed quattro medium full-face mask at 17 cwp with heated humidifier and 2 lpm supplemental oxygen.  dx: severe obstructive sleep apnea (see attached sleep study). 21)  Nu-iron 150 Mg Caps (Polysaccharide iron complex) .... Take 1 tablet twice a day with meals. 22)  Omeprazole 20 Mg Cpdr (Omeprazole) .... Take 1 capsule by mouth once  a day 23)  Magnesium 500 Mg Tabs (Magnesium) .... Once daily 24)  Calcium Carbonate-vitamin D 600-400 Mg-unit Tabs (Calcium carbonate-vitamin d) .... Take 1 tablet by mouth two times a day   Patient Instructions: 1)  Please schedule a follow-up appointment in 3 months. 2)  Please avoid concentrated sweets (sugared beverages, desserts, etc).  Prescriptions: NU-IRON 150 MG  CAPS (POLYSACCHARIDE IRON COMPLEX) take 1 tablet twice a day with meals.  #60 x 3   Entered and Authorized by:   Margarito Liner MD   Signed by:   Margarito Liner MD on 02/04/2010   Method used:   Electronically to        [facility] [address] [id number]* (retail)       [address] [place], [place]  [address]       Ph: [phone]       Fax: [phone]   RxID:   [id number]   Prevention & Chronic Care Immunizations   Influenza vaccine: Fluvax MCR  (09/02/2009)   Influenza vaccine due: 08/06/2010    Tetanus booster: Not documented   Td booster deferral: Deferred  (09/02/2009)    Pneumococcal vaccine: Pneumovax  (11/12/2009)   Pneumococcal vaccine deferral: Deferred  (09/02/2009)    H. zoster vaccine: Not documented  Colorectal Screening   Hemoccult: Not documented   Hemoccult action/deferral: Deferred  (09/02/2009)    Colonoscopy: Impression: Diverticulosis in the sigmoid colon and in the descending colon. Two 4 to 6 mm polyps in the sigmoid colon.  Resected and  retrieved. Exam by: Everardo All. Madilyn Fireman, MD, [facility]  Pathology: Hyperplastic polyps, no adenomatous change or malignancy identified.  (07/17/2008)   Colonoscopy due: 07/17/2013  Other Screening   Pap smear: Not documented   Pap smear action/deferral: Not indicated S/P hysterectomy  (06/20/2009)    Mammogram: BI-RADS CATEGORY 2:  Benign finding(s).^MM DIGITAL DIAGNOSTIC BILAT LTD  (11/04/2009)   Mammogram due: 10/22/2010    DXA bone density scan: Osteopenia. Lumbar spine T score -1.3 Left femur neck T score -1.8 Right femur neck T score -0.8  (10/23/2009)   DXA bone density action/deferral: Ordered  (09/02/2009)   Smoking status: quit > 6 months  (02/04/2010)  Lipids   Total Cholesterol: 247  (09/24/2009)   Lipid panel action/deferral: Lipid Panel ordered   LDL: 143  (09/24/2009)   LDL Direct: Not documented   HDL: 67  (09/24/2009)   Triglycerides: 184  (09/24/2009)    SGOT (AST): 22  (12/10/2009)   SGPT (ALT): 20  (12/10/2009)   Alkaline phosphatase: 89  (12/10/2009)   Total bilirubin: 0.3  (12/10/2009)    Lipid flowsheet reviewed?: Yes   Progress toward LDL goal: Unchanged  Hypertension   Last Blood Pressure: 138 / 87  (02/04/2010)   Serum creatinine: 0.53  (12/10/2009)   Serum potassium 3.7  (12/10/2009)    Hypertension flowsheet reviewed?: Yes   Progress toward BP goal: At goal  Self-Management Support :   Personal Goals (by the next clinic visit) :      Personal blood pressure goal: 140/90  (09/02/2009)     Personal LDL goal: 130  (09/02/2009)    Patient will work on the following items until the next clinic visit to reach self-care goals:     Medications and monitoring: take my medicines every day, bring all of my medications to every visit  (02/04/2010)     Eating: drink diet soda or water instead of juice or soda, eat  more vegetables, use fresh or frozen vegetables, eat foods that are low in salt, eat baked foods instead of fried foods, eat fruit  for snacks and desserts  (02/04/2010)    Hypertension self-management support: Education handout, Resources for patients handout, Written self-care plan  (02/04/2010)   Hypertension self-care plan printed.   Hypertension education handout printed    Lipid self-management support: Education handout, Resources for patients handout, Written self-care plan  (02/04/2010)   Lipid self-care plan printed.   Lipid education handout printed      Resource handout printed.

## 2011-01-05 NOTE — Miscellaneous (Signed)
[facility]   Imported By: Margie Billet 03/31/2010 12:21:46  _____________________________________________________________________  External Attachment:    Type:   Image     Comment:   External Document

## 2011-01-05 NOTE — Progress Notes (Signed)
Summary: CXR Result > pt aware  Phone Note Outgoing Call   Reason for Call: Discuss lab or test results Summary of Call: Call the patient and tell her CXR was normal at [facility] 11/23 Initial call taken by: Storm Frisk MD,  October 29, 2010 7:11 PM  Follow-up for Phone Call        called spoke with pt.  advised of normal cxr results as stated by PEW above.  pt verbalized her understanding. Boone Master CNA/MA  October 30, 2010 9:36 AM

## 2011-01-05 NOTE — Progress Notes (Signed)
Summary: refill/gg  Phone Note Refill Request  on June 10, 2010 4:39 PM  Refills Requested: Medication #1:  LIDODERM 5 % PTCH Apply 1 patch to skin once daily as directed to affected area; leave on for no more than 12 hours every day   Last Refilled: 05/06/2010  Method Requested: Electronic Initial call taken by: Merrie Roof RN,  June 10, 2010 4:39 PM  Follow-up for Phone Call        Refilled electronically.  Follow-up by: Margarito Liner MD,  June 10, 2010 4:42 PM    Prescriptions: LIDODERM 5 % PTCH (LIDOCAINE) Apply 1 patch to skin once daily as directed to affected area; leave on for no more than 12 hours every day  #30 x 3   Entered and Authorized by:   Margarito Liner MD   Signed by:   Margarito Liner MD on 06/10/2010   Method used:   Electronically to        [facility] [address] [id number]* (retail)       [address] [place], [place]  [address]       Ph: [phone]       Fax: [phone]   RxID:   [id number]

## 2011-01-05 NOTE — Miscellaneous (Signed)
Summary: Hospital Admission  INTERNAL MEDICINE ADMISSION HISTORY AND PHYSICAL  PCP: Dr. Margarito Liner  CC: AMS  HPI:  70 year old female with pmhx of Meniere's disease, status post right internal carotid artery aneurysm coiling  (December 2005),status post cerebrovascular stenting in August 2005, COPD on Home oxygen (2L only at night) .who presents with a chief complaint of altered mental status associated with fever. Symptoms started at around 6pm. Daughter reports that patient was not coherent and was somewhat combative. Patient had been complaining of headaches and neck pain several days prior. Headaches were localized to occipital area and radiated to parietal area. Lasted about 1 hour. Described as throbbing in nature. Sometimes would resolve with pain medications. EMS was called and patient's blood pressure was found to be  80/40.  After 1 liter of NS patient's blood pressure improved and remained stable. Patient was not hypoglycemic. Symptoms associated with diphoresis. Reports to have had hematuria in the last week associated with frequency and dysuria. Reports to have had a dry cough for the last month. Denies chest pain, palpitations, increased shortness of breath, abdominal pain, myalgias, diarrhea, hemoptysis, melena, hematochezia, or n/v. Patient reports to have taken vicodin, soma, and elavil before experiencing confusion.   ALLERGIES:  ! 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 Fibromyalgia. Hyperlipidemia intolerant to statins Hypertension- on Procardia since 2008 History of depression. Meniere's disease. History of carotid aneurysm,  status post right internal carotid artery aneurysm coiling  (December 2005),status post cerebrovascular stenting in August 2005 History of  diverticulitis Obesity Osteoarthritis Anxiety GERD OSA on CPAP Trilobar PNA 2008 Melanoma, status post resection.    PAST SURGICAL HISTORY:  1.  Tonsillectomy.  2.  Total abdominal hysterectomy.  3.  Breast surgery.  4.  Surgery for a fractured finger.  MEDICATIONS: VICODIN ES 7.5-750 MG TABS (HYDROCODONE-ACETAMINOPHEN) Take 1 tablet by mouth four times a day as needed for pain SPIRIVA HANDIHALER 18 MCG CAPS (TIOTROPIUM BROMIDE MONOHYDRATE) Inhale contents of 1 capsule once a day ADVAIR DISKUS 250-50 MCG/DOSE MISC (FLUTICASONE-SALMETEROL) Inhale 1 puff two times a day SOMA 350 MG TABS (CARISOPRODOL) Take 1 tablet by mouth four times a day as needed for muscle spasm ZOLOFT 100 MG TABS (SERTRALINE HCL) Take 1 tablet by mouth two times a day ALPRAZOLAM 0.5 MG TABS (ALPRAZOLAM) Take 1 to 11/2  tablets by mouth three times a day as needed for anxiety PROCARDIA XL 90 MG TB24 (NIFEDIPINE) Take 1 tablet by mouth once a day KLOR-CON 10 10 MEQ CR-TABS (POTASSIUM CHLORIDE) Take 2 tablets by mouth once a day AMITRIPTYLINE HCL 150 MG TABS (AMITRIPTYLINE HCL) Take 1 tablet by mouth once a day FLUTICASONE PROPIONATE 0.05 % CREA (FLUTICASONE PROPIONATE) Take 2 sprays in each nostril once a day PROAIR HFA 108 (90 BASE) MCG/ACT AERS (ALBUTEROL SULFATE) Inhale 2 puffs four times a day as needed LIDODERM 5 % PTCH (LIDOCAINE) Apply 1 patch to skin once daily as directed to affected area; leave on for no more than 12 hours every day ASPIRIN 81 MG TBEC (ASPIRIN) Take 1 tablet by mouth once a day HYDROCHLOROTHIAZIDE 25 MG  TABS (HYDROCHLOROTHIAZIDE) Take 1 tablet by mouth once a day ANTIVERT 12.5 MG TABS (MECLIZINE HCL) Take 1 tablet by mouth two times a day as needed for vertigo RED YEAST RICE EXTRACT 600 MG CAPS (RED YEAST RICE EXTRACT) Take 1 tablet by mouth once a  day MUCINEX 600 MG TB12 (GUAIFENESIN) Take 1 tablet by mouth two times a day * OXYGEN 2L at bedtime * CPAP ResMed Quattro medium full-face  mask at 17 CWP with heated humidifier and 2 LPM supplemental oxygen.  Dx: Severe obstructive sleep apnea (see attached sleep study). NU-IRON 150 MG  CAPS (POLYSACCHARIDE IRON COMPLEX) Take 1 tab by mouth at bedtime OMEPRAZOLE 20 MG  CPDR (OMEPRAZOLE) Take 1 capsule by mouth once a day MAGNESIUM 500 MG TABS (MAGNESIUM) once daily CALCIUM CARBONATE-VITAMIN D 600-400 MG-UNIT TABS (CALCIUM CARBONATE-VITAMIN D) Take 1 tablet by mouth two times a day   SOCIAL HISTORY: Widow. Lives in [place] with daughter which is her caretaker.  Former smoker.  Quit in 2009.  1 ppd x 35 yrs. Denies alcohol or illicit drug use.   FAMILY HISTORY Her mother died in her 55s from a MI, her father died in his 6s he had lung disease and questionably TB.  ROS: All 12 point system reviewed otherwise negative.  VITALS: Tmax: 105  P: 118 BP: 158/69  R: 24 O2SAT:99  ON:ra  PHYSICAL EXAM:  Gen: Obese, NAD, Incoherent Eyes: PERRL, EOMI, No signs of anemia or jaundince. ENT: MMM, OP erythematous  No thrush or exudates. Neck: Supple, No carotid Bruits, No aprreciated JVD, No thyromegaly Resp: Good air movement, bibasilar crackles CVS: S1S2 RRR, No M/R/G GI: Abdomen is soft. mild tenderness to lower quadrants, ND, NG, NR, BS+. No organomegaly. Ext: trace pedal edema bilaterally GU: No CVA tenderness. Skin: No visible rashes, scars. Lymph: No palpable lymphadenopathy. MS: Moving all 4 extremities. Neuro: A&O X2, CN II - XII are grossly intact. Negative Kernigs or Brudzinski's sign. Motor strength is 5/5 in the all 4 extremities, Sensations intact to light touch Psych: Appropriate   LABS: Sodium (NA)                              128        l      135-145          mEq/L  Potassium (K)                            3.0        l      3.5-5.1          mEq/L  Chloride                                 93         l      96-112           mEq/L  CO2                                      25                19-32             mEq/L  Glucose                                  134        h      70-99  mg/dL  BUN                                      9                 6-23             mg/dL  Creatinine                               0.83              0.4-1.2          mg/dL  GFR, Est Non African American            >60               >60              mL/min  GFR, Est African American                >60               >60              mL/min    Oversized comment, see footnote  1  Bilirubin, Total                         0.5               0.3-1.2          mg/dL  Alkaline Phosphatase                     86                39-117           U/L  SGOT (AST)                               26                0-37             U/L  SGPT (ALT)                               20                0-35             U/L  Total  Protein                           7.0               6.0-8.3          g/dL  Albumin-Blood                            3.5               3.5-5.2          g/dL  Calcium  9.1               8.4-10.5         mg/dL   WBC                                      10.2              4.0-10.5         K/uL  RBC                                      3.84       l      3.87-5.11        MIL/uL  Hemoglobin (HGB)                         12.7              12.0-15.0        g/dL  Hematocrit (HCT)                         36.7              36.0-46.0        %  MCV                                      95.6              78.0-100.0       fL  MCH -                                    33.1              26.0-34.0        pg  MCHC                                     34.6              30.0-36.0        g/dL  RDW                                      12.4              11.5-15.5        %  Platelet Count (PLT)                     217               150-400          K/uL  Neutrophils, %                           95         h      43-77            %  Lymphocytes, %                           3          l      12-46            %   Monocytes, %                             1          l      3-12             %  Eosinophils, %                           0                 0-5              %  Basophils, %                             0                 0-1              %  Neutrophils, Absolute                    9.8        h      1.7-7.7          K/uL  Lymphocytes, Absolute                    0.3        l      0.7-4.0          K/uL  Monocytes, Absolute                      0.1               0.1-1.0          K/uL  Eosinophils, Absolute                    0.0               0.0-0.7          K/uL  Basophils, Absolute                      0.0               0.0-0.1          K/uL   Lactic Acid, Venous                      2.8        h      0.5-2.2          mmol/L   Procalcitonin                            3.70                               ng/mL  CKMB, POC                                1.3               1.0-8.0          ng/mL  Troponin I, POC                          0.23       h      0.00-0.09        ng/mL  Myoglobin, POC                           209        H      12-200           ng/mL  Creatine Kinase, Total                   172               7-177            U/L  CK, MB                                   3.2               0.3-4.0          ng/mL  Relative Index                           1.9               0.0-2.5  Troponin I                               0.47       h      0.00-0.06        ng/mL   Color, Urine                             YELLOW            YELLOW  Appearance                               CLOUDY     a      CLEAR  Specific Gravity                         1.016             1.005-1.030  pH                                       5.5               5.0-8.0  Urine Glucose                            NEGATIVE  NEG              mg/dL  Bilirubin                                NEGATIVE          NEG  Ketones                                  NEGATIVE          NEG              mg/dL  Blood                                     TRACE      a      NEG  Protein                                  30         a      NEG              mg/dL  Urobilinogen                             0.2               0.0-1.0          mg/dL  Nitrite                                  NEGATIVE          NEG  Leukocytes                               MODERATE   a      NEG  Squamous Epithelial / LPF                MANY       a      RARE  WBC / HPF                                7-10              <3               WBC/hpf  RBC / HPF                                0-2               <3               RBC/hpf  Bacteria / HPF                           MANY       a      RARE   Cholesterol  170               0-200            mg/dL    ATP III CLASSIFICATION:    <200     mg/dL   Desirable    119-147  mg/dL   Borderline High    >=829    mg/dL   High  Triglyceride-LIPR                        126               <150             mg/dL  Cholesterol, HDL                         56                >39              mg/dL  Cholesterol, LDL                         89                0-99             mg/dL    Oversized comment, see footnote  1  Cholesterol, VLDL                        25                0-40             mg/dL  Total CHOL/HDL Ratio                     3.0     Magnesium                                1.4        l      1.5-2.5          mg/dL  Sodium (NA)                              130        l      135-145          mEq/L  Potassium (K)                            3.3        l      3.5-5.1          mEq/L  Chloride                                 96                96-112           mEq/L  CO2  24                19-32            mEq/L  Glucose                                  182        h      70-99            mg/dL  BUN                                      6                 6-23             mg/dL  Creatinine                               0.77              0.4-1.2          mg/dL  GFR, Est  Non African American            >60               >60              mL/min  GFR, Est African American                >60               >60              mL/min    Oversized comment, see footnote  1  Calcium                                  8.6               8.4-10.5         mg/dL  Chest xray:  IMPRESSION:    1.  Decreasing lung volumes and increasing pulmonary vascular   congestion.   2.  Question early congestive heart failure.  CT Head without contrast:  1. Mild small vessel ischemic change, progressive since 01/08/2005.   2.  Enlargement of the third and lateral ventricles.  This is felt to be slightly out of proportion to the level of cerebral atrophy.   Cannot exclude developing hydrocephalus.  Consider further evaluation with pre and post contrast brain MR.  No evidence of   trans-ependymal CSF resorption to suggest acuity.   3.  Prior right internal carotid artery aneurysm coiling with resultant beam hardening artifact.   ASSESSMENT AND PLAN:  (1) AMS/Fever/Headache: Concerning for Meningitis. Patient started on empiric treatment with Rocephin, Vancomycin, Ampicillin (patient >50 yr so coverage for Listeria and Acyclovir (HSV). Patient will need Lumbar puncture for further CSF analysis to rule out meningitis. Blood cultures pending. Differential for AMS/Fever includes urosepsis. Urine cultures pending. Differential for AMS/Headache includes ?hydrocephalus per CT. Consider further imaging per recommendations. Differential for AMS includes medications, hyponatremia. Titrate antibiotics based on LP studies.   (2) Mild CHF Exacerbation- Last Echo 2006- EF 55-65%. Patient has been on Procardia since 2008 which may cause  CHF. Would consider tapering off Procardia and diuresing as blood pressure tolerates when more stable. May be 2/2 severe OSA. Repeat 2D echo.   (3) Elevated Troponin: Concerning for NSTEMI although could be elevated due to demand ischemia from hypotension, CHF and COPD.  Continue to cycle cardiac enzymes. 2D Echo pending to evaluate for wall motion abnormalities. Will hold starting on heparin drip for now until LP done. Continue aspirin. LDL 89  (4) Hyponatremia: May be 2/2 to mild CHF. Consider diuresing when blood pressure stable. Urine sodium and osmolality pending for further evaluation. Differential includes 2/2 to HCTZ, or  SIADH from COPD, ?hydrocephalus.  (5) UTI- On rocephin. Review urine culture results.   (6) Hypokalemia- May be 2/2 to hypomagnesemia. Replete and recheck in am.  (7) COPD- Continue home meds and nightime oxygen.  (8) OSA- Continue CPAP  (9) HTN: Hold HCTZ for now.   (10) DEPRESSION/ANXIETY: Continue home medications at lower doses. Hold for AMS.  (11) VTE PROPH: lovenox

## 2011-01-05 NOTE — Letter (Signed)
Summary: [facility]  [place]   Imported By: Florinda Marker 12/12/2009 15:19:33  _____________________________________________________________________  External Attachment:    Type:   Image     Comment:   External Document

## 2011-01-05 NOTE — Progress Notes (Signed)
Summary: Refill Request for Vicodin  Phone Note Refill Request Message from:  Pharmacy  Refills Requested: Medication #1:  VICODIN ES 7.5-750 MG TABS Take 1 tablet by mouth four times a day as needed for pain   Last Refilled: 03/12/2010 Electronic refill request received from pharmacy.  Initial call taken by: Margarito Liner MD,  Apr 08, 2010 6:12 PM  Follow-up for Phone Call        Refill approved-nurse to complete. Follow-up by: Margarito Liner MD,  Apr 08, 2010 6:13 PM  Additional Follow-up for Phone Call Additional follow up Details #1::        Rx called to pharmacy [facility] pharmacy. Additional Follow-up by: Chinita Pester RN,  Apr 09, 2010 8:43 AM    Prescriptions: VICODIN ES 7.5-750 MG TABS (HYDROCODONE-ACETAMINOPHEN) Take 1 tablet by mouth four times a day as needed for pain  #120 x 3   Entered and Authorized by:   Margarito Liner MD   Signed by:   Margarito Liner MD on 04/08/2010   Method used:   Telephoned to ...       [facility] [id number]* (retail)       [address] [place], [place]  [address]       Ph: [phone]       Fax: [phone]   RxID:   [id number]

## 2011-01-05 NOTE — Discharge Summary (Signed)
Summary: Hospital Discharge Update    Hospital Discharge Update:  Date of Admission: 11/04/2010 Date of Discharge: 11/06/2010  Brief Summary:  Patient is 70 year old woman admitted for AMS and fever 2/2 urosepsis. BCx and UCx grew out gnr, and UCx showed klebsiella sensitive to Bactrim, cipro, and other meds. Pt discharged home to complete 10 day course of cipro  Also, pt had elev troponin on admission with no chest pain/dyspnea and no change on EKG x2.  Further work-up was not pursued as troponin finding not necessarily specific for ACS, esp given clinical picture and EKGs. However, ECHO was done and showed grade I diastolic heart failure with vigorous EF.  Pt was diuresed with lasix, but sent home on HCTZ.  Also, given new HF (though likely 2/2 severe OSA), pt was switched from procardia to ACE-  F/u with Dr. Meredith Pel on December 8 at 2:45pm - pt will need BMet and review of whether she still needs K+ supplementation given that she is now on ACE-II.  Lab or other results pending at discharge:  blood culture sensitivities  Labs needed at follow-up: Basic metabolic panel  Other follow-up issues:  BP check  Problem list changes:  Added new problem of Hospitalized for  UROSEPSIS (ICD-599.0) Added new problem of UNSPECIFIED DIASTOLIC HEART FAILURE (ICD-428.30) Added new problem of History of  OTHER NONSPECIFIC FINDINGS EXAMINATION OF BLOOD (ICD-790.99)  Medication list changes:  Removed medication of PROCARDIA XL 90 MG TB24 (NIFEDIPINE) Take 1 tablet by mouth once a day Added new medication of LISINOPRIL 5 MG TABS (LISINOPRIL) Take 1 tablet by mouth once a day - Signed Added new medication of CIPROFLOXACIN HCL 500 MG TABS (CIPROFLOXACIN HCL) Take 1 tablet by mouth two times a day for 9 days - Signed Rx of LISINOPRIL 5 MG TABS (LISINOPRIL) Take 1 tablet by mouth once a day;  #30 x 5;  Signed;  Entered by: Danelle Berry, MD;  Authorized by: Danelle Berry, MD;  Method used: Print then Give  to Patient Rx of CIPROFLOXACIN HCL 500 MG TABS (CIPROFLOXACIN HCL) Take 1 tablet by mouth two times a day for 9 days;  #18 x 0;  Signed;  Entered by: Danelle Berry, MD;  Authorized by: Danelle Berry, MD;  Method used: Print then Give to Patient  The medication, problem, and allergy lists have been updated.  Please see the dictated discharge summary for details.  Discharge medications:  VICODIN ES 7.5-750 MG TABS (HYDROCODONE-ACETAMINOPHEN) Take 1 tablet by mouth four times a day as needed for pain SPIRIVA HANDIHALER 18 MCG CAPS (TIOTROPIUM BROMIDE MONOHYDRATE) Inhale contents of 1 capsule once a day ADVAIR DISKUS 250-50 MCG/DOSE MISC (FLUTICASONE-SALMETEROL) Inhale 1 puff two times a day SOMA 350 MG TABS (CARISOPRODOL) Take 1 tablet by mouth four times a day as needed for muscle spasm ZOLOFT 100 MG TABS (SERTRALINE HCL) Take 1 tablet by mouth two times a day ALPRAZOLAM 0.5 MG TABS (ALPRAZOLAM) Take 1 to 11/2  tablets by mouth three times a day as needed for anxiety KLOR-CON 10 10 MEQ CR-TABS (POTASSIUM CHLORIDE) Take 2 tablets by mouth once a day AMITRIPTYLINE HCL 150 MG TABS (AMITRIPTYLINE HCL) Take 1 tablet by mouth once a day FLUTICASONE PROPIONATE 0.05 % CREA (FLUTICASONE PROPIONATE) Take 2 sprays in each nostril once a day PROAIR HFA 108 (90 BASE) MCG/ACT AERS (ALBUTEROL SULFATE) Inhale 2 puffs four times a day as needed LIDODERM 5 % PTCH (LIDOCAINE) Apply 1 patch to skin once daily as directed to affected area;  leave on for no more than 12 hours every day ASPIRIN 81 MG TBEC (ASPIRIN) Take 1 tablet by mouth once a day IBUPROFEN 200 MG TABS (IBUPROFEN) Take 2 tablets by mouth two times a day as needed for pain HYDROCHLOROTHIAZIDE 25 MG  TABS (HYDROCHLOROTHIAZIDE) Take 1 tablet by mouth once a day ANTIVERT 12.5 MG TABS (MECLIZINE HCL) Take 1 tablet by mouth two times a day as needed for vertigo RED YEAST RICE EXTRACT 600 MG CAPS (RED YEAST RICE EXTRACT) Take 1 tablet by mouth once a  day MUCINEX 600 MG TB12 (GUAIFENESIN) Take 1 tablet by mouth two times a day * OXYGEN 2L at bedtime * CPAP ResMed Quattro medium full-face mask at 17 CWP with heated humidifier and 2 LPM supplemental oxygen.  Dx: Severe obstructive sleep apnea (see attached sleep study). NU-IRON 150 MG  CAPS (POLYSACCHARIDE IRON COMPLEX) Take 1 tab by mouth at bedtime OMEPRAZOLE 20 MG  CPDR (OMEPRAZOLE) Take 1 capsule by mouth once a day MAGNESIUM 500 MG TABS (MAGNESIUM) once daily CALCIUM CARBONATE-VITAMIN D 600-400 MG-UNIT TABS (CALCIUM CARBONATE-VITAMIN D) Take 1 tablet by mouth two times a day LISINOPRIL 5 MG TABS (LISINOPRIL) Take 1 tablet by mouth once a day CIPROFLOXACIN HCL 500 MG TABS (CIPROFLOXACIN HCL) Take 1 tablet by mouth two times a day for 9 days  Other patient instructions:  Please follow-up with Dr. Meredith Pel on December 8 at 2:45pm [facility] [phone]). Take the full course of antibiotics. Please take your other medicines as directed. Note that some your medications have changed - please stop the procardia and start taking lisinopril.  Note: Hospital Discharge Medications & Other Instructions handout was printed, one copy for patient and a second copy to be placed in hospital chart.  Prescriptions: CIPROFLOXACIN HCL 500 MG TABS (CIPROFLOXACIN HCL) Take 1 tablet by mouth two times a day for 9 days  #18 x 0   Entered and Authorized by:   Danelle Berry, MD   Signed by:   Danelle Berry, MD on 11/06/2010   Method used:   Print then Give to Patient   RxID:   [id number] LISINOPRIL 5 MG TABS (LISINOPRIL) Take 1 tablet by mouth once a day  #30 x 5   Entered and Authorized by:   Danelle Berry, MD   Signed by:   Danelle Berry, MD on 11/06/2010   Method used:   Print then Give to Patient   RxID:   [id number]

## 2011-01-05 NOTE — Miscellaneous (Signed)
Summary: Initial Summary For PT  Initial Summary For PT   Imported By: Florinda Marker 02/03/2010 08:51:18  _____________________________________________________________________  External Attachment:    Type:   Image     Comment:   External Document

## 2011-01-05 NOTE — Assessment & Plan Note (Signed)
Summary: Pulmonary OV   Primary Provider/Referring Provider:  Margarito Liner MD  CC:  Follow up.  c/o dry cough - worse x 2 1/2 wks and hoarseness.  SOB with exertion - unchanged.  Wheezing at times.  .  History of Present Illness: This is a 70 year old, white female with chronic obstructive lung disease, asthmatic bronchitic component.    May 14, 2009 3:09 PM Off cigs since 1/10.  Has dry cough and gets hoarse.  No wheeze.  No chestpain. Pt denies any significant sore throat, nasal congestion or excess secretions, fever, chills, sweats, unintended weight loss, pleurtic or exertional chest pain, orthopnea PND, or leg swelling Pt denies any increase in rescue therapy over baseline, denies waking up needing it or having any early am or nocturnal exacerbations of coughing/wheezing/or dyspnea.  November 12, 2009 12:04 PM Still off cigarettes. Still with dry cough at night.  No mucous is seen.  CPAP has humidifier.   No heartburn.  Still hoarseness.   Pt denies any significant sore throat, nasal congestion or excess secretions, fever, chills, sweats, unintended weight loss, pleurtic or exertional chest pain, orthopnea PND, or leg swelling Pt denies any increase in rescue therapy over baseline, denies waking up needing it or having any early am or nocturnal exacerbations of coughing/wheezing/or dyspnea.  March 13, 2010 2:59 PM Since last ov more dyspneic,  but goes to PT for more mobility.  Notes sl amount of wheezing for one month and throat  is dry.  No heartburn.  Some cough at night.  No sore throat.  Some pndrip.  Nose is stopped up.  Difficulty with cpap mask.     October 28, 2010 4:36 PM Last two weeks is more hoarse,  worse in the evening and in the AM cough is worse and is dry.  has to clear the throat. Has to cough all night long.  Uses cpap at night. Cpap has helped the sleeping disorder, there is less daytime hypersomnolence.   Pt denies any significant sore throat, nasal  congestion or excess secretions, fever, chills, sweats, unintended weight loss, pleurtic or exertional chest pain, orthopnea PND, or leg swelling   Pt denies any significant sore throat, nasal congestion or excess secretions, fever, chills, sweats, unintended weight loss, pleurtic or exertional chest pain, orthopnea PND, or leg swelling.   Preventive Screening-Counseling & Management  Alcohol-Tobacco     Smoking Status: quit > 6 months  CXR  Procedure date:  10/28/2010  Findings:      IMPRESSION: Mild chronic bronchitic type lung changes without definite acute overlying pulmonary process.     Current Medications (verified): 1)  Vicodin Es 7.5-750 Mg Tabs (Hydrocodone-Acetaminophen) .... Take 1 Tablet By Mouth Four Times A Day As Needed For Pain 2)  Spiriva Handihaler 18 Mcg Caps (Tiotropium Bromide Monohydrate) .... Inhale Contents of 1 Capsule Once A Day 3)  Advair Diskus 250-50 Mcg/dose Misc (Fluticasone-Salmeterol) .... Inhale 1 Puff Two Times A Day 4)  Soma 350 Mg Tabs (Carisoprodol) .... Take 1 Tablet By Mouth Four Times A Day As Needed For Muscle Spasm 5)  Zoloft 100 Mg Tabs (Sertraline Hcl) .... Take 1 Tablet By Mouth Two Times A Day 6)  Alprazolam 0.5 Mg Tabs (Alprazolam) .... Take 1 To 11/2  Tablets By Mouth Three Times A Day As Needed For Anxiety 7)  Procardia Xl 90 Mg Tb24 (Nifedipine) .... Take 1 Tablet By Mouth Once A Day 8)  Klor-Con 10 10 Meq Cr-Tabs (Potassium Chloride) .Marland KitchenMarland KitchenMarland Kitchen  Take 2 Tablets By Mouth Once A Day 9)  Amitriptyline Hcl 150 Mg Tabs (Amitriptyline Hcl) .... Take 1 Tablet By Mouth Once A Day 10)  Fluticasone Propionate 0.05 % Crea (Fluticasone Propionate) .... Take 2 Sprays in Each Nostril Once A Day 11)  Proair Hfa 108 (90 Base) Mcg/act Aers (Albuterol Sulfate) .... Inhale 2 Puffs Four Times A Day As Needed 12)  Lidoderm 5 % Ptch (Lidocaine) .... Apply 1 Patch To Skin Once Daily As Directed To Affected Area; Leave On For No More Than 12 Hours Every Day 13)   Aspirin 81 Mg Tbec (Aspirin) .... Take 1 Tablet By Mouth Once A Day 14)  Ibuprofen 200 Mg Tabs (Ibuprofen) .... Take 2 Tablets By Mouth Two Times A Day As Needed For Pain 15)  Hydrochlorothiazide 25 Mg  Tabs (Hydrochlorothiazide) .... Take 1 Tablet By Mouth Once A Day 16)  Antivert 12.5 Mg Tabs (Meclizine Hcl) .... Take 1 Tablet By Mouth Two Times A Day As Needed For Vertigo 17)  Red Yeast Rice Extract 600 Mg Caps (Red Yeast Rice Extract) .... Take 1 Tablet By Mouth Once A Day 18)  Mucinex 600 Mg Tb12 (Guaifenesin) .... Take 1 Tablet By Mouth Two Times A Day 19)  Oxygen .... 2l At Bedtime 20)  Cpap .... Resmed Quattro Medium Full-Face Mask At 17 Cwp With Heated Humidifier and 2 Lpm Supplemental Oxygen.  Dx: Severe Obstructive Sleep Apnea (See Attached Sleep Study). 21)  Nu-Iron 150 Mg  Caps (Polysaccharide Iron Complex) .... Take 1 Tab By Mouth At Bedtime 22)  Omeprazole 20 Mg  Cpdr (Omeprazole) .... Take 1 Capsule By Mouth Once A Day 23)  Magnesium 500 Mg Tabs (Magnesium) .... Once Daily 24)  Calcium Carbonate-Vitamin D 600-400 Mg-Unit Tabs (Calcium Carbonate-Vitamin D) .... Take 1 Tablet By Mouth Two Times A Day  Allergies (verified): 1)  ! Tetracycline  Past History:  Past medical, surgical, family and social histories (including risk factors) reviewed, and no changes noted (except as noted below).  Past Medical History: Reviewed history from 09/20/2008 and no changes required. COPD- on Home oxygen Hypertension Obesity Fibromyalgia Osteoarthritis Anxiety Pneumonia GERD OSA  Past Surgical History: Reviewed history from 03/22/2007 and no changes required. 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 Corliss Skains on November 10, 2004 Hysterectomy 1975  Family History: Reviewed history from 02/22/2008 and no changes required. non contrib.  Social History: Reviewed history from 09/12/2008 and no changes  required. Widow/Widower Former smoker.  Quit in 2009.  1 ppd x 35 yrs. Alcohol use-no  Review of Systems       The patient complains of shortness of breath with activity and non-productive cough.  The patient denies shortness of breath at rest, productive cough, coughing up blood, chest pain, irregular heartbeats, acid heartburn, indigestion, loss of appetite, weight change, abdominal pain, difficulty swallowing, sore throat, tooth/dental problems, headaches, nasal congestion/difficulty breathing through nose, sneezing, itching, ear ache, anxiety, depression, hand/feet swelling, joint stiffness or pain, rash, change in color of mucus, and fever.    Vital Signs:  Patient profile:   70 year old female Height:      63 inches Weight:      249 pounds BMI:     44.27 O2 Sat:      96 % on Room air Temp:     98.0 degrees F oral Pulse rate:   81 / minute BP sitting:   126 / 80  (right arm) Cuff size:  large  Vitals Entered By: Gweneth Dimitri RN (October 28, 2010 4:30 PM)  O2 Flow:  Room air CC: Follow up.  c/o dry cough - worse x 2 1/2 wks and hoarseness.  SOB with exertion - unchanged.  Wheezing at times.   Comments Medications reviewed with patient Daytime contact number verified with patient. Gweneth Dimitri RN  October 28, 2010 4:28 PM    Physical Exam  Additional Exam:  Gen: WD WN      WF    in NAD    NCAT Heent:  no jvd, no TMG, no cervical LNademopathy, orophyx clear,  nares with clear watery drainage. Cor: RRR nl s1/s2  no s3/s4  no m r h g Abd: soft NT BSA   no masses  No HSM  no rebound or guarding Ext perfused with no c v e v.d Neuro: intact, moves all 4s, CN II-XII intact, DTRs intact Chest: distant BS  no wheezes, rales, rhonchi   no egophony  no consolidative breath sounds, mild hyperresonance to percussion Skin: clear  Genital/Rectal :deferred    Impression & Recommendations:  Problem # 1:  COPD (ICD-496) Assessment Unchanged Mild exacerbation, upper airway  irritation due to use of advair and too fast inhalation on advair. Note CXR revealed NAD. plan depomedrol injection 120mg  IM slower inhalation with advair no indication for ABX cont flonase nasal flu vaccine   Medications Added to Medication List This Visit: 1)  Nu-iron 150 Mg Caps (Polysaccharide iron complex) .... Take 1 tab by mouth at bedtime  Complete Medication List: 1)  Vicodin Es 7.5-750 Mg Tabs (Hydrocodone-acetaminophen) .... Take 1 tablet by mouth four times a day as needed for pain 2)  Spiriva Handihaler 18 Mcg Caps (Tiotropium bromide monohydrate) .... Inhale contents of 1 capsule once a day 3)  Advair Diskus 250-50 Mcg/dose Misc (Fluticasone-salmeterol) .... Inhale 1 puff two times a day 4)  Soma 350 Mg Tabs (Carisoprodol) .... Take 1 tablet by mouth four times a day as needed for muscle spasm 5)  Zoloft 100 Mg Tabs (Sertraline hcl) .... Take 1 tablet by mouth two times a day 6)  Alprazolam 0.5 Mg Tabs (Alprazolam) .... Take 1 to 11/2  tablets by mouth three times a day as needed for anxiety 7)  Procardia Xl 90 Mg Tb24 (Nifedipine) .... Take 1 tablet by mouth once a day 8)  Klor-con 10 10 Meq Cr-tabs (Potassium chloride) .... Take 2 tablets by mouth once a day 9)  Amitriptyline Hcl 150 Mg Tabs (Amitriptyline hcl) .... Take 1 tablet by mouth once a day 10)  Fluticasone Propionate 0.05 % Crea (Fluticasone propionate) .... Take 2 sprays in each nostril once a day 11)  Proair Hfa 108 (90 Base) Mcg/act Aers (Albuterol sulfate) .... Inhale 2 puffs four times a day as needed 12)  Lidoderm 5 % Ptch (Lidocaine) .... Apply 1 patch to skin once daily as directed to affected area; leave on for no more than 12 hours every day 13)  Aspirin 81 Mg Tbec (Aspirin) .... Take 1 tablet by mouth once a day 14)  Ibuprofen 200 Mg Tabs (Ibuprofen) .... Take 2 tablets by mouth two times a day as needed for pain 15)  Hydrochlorothiazide 25 Mg Tabs (Hydrochlorothiazide) .... Take 1 tablet by mouth once  a day 16)  Antivert 12.5 Mg Tabs (Meclizine hcl) .... Take 1 tablet by mouth two times a day as needed for vertigo 17)  Red Yeast Rice Extract 600 Mg Caps (Red yeast rice extract) .Marland KitchenMarland KitchenMarland Kitchen  Take 1 tablet by mouth once a day 18)  Mucinex 600 Mg Tb12 (Guaifenesin) .... Take 1 tablet by mouth two times a day 19)  Oxygen  .... 2l at bedtime 20)  Cpap  .... Resmed quattro medium full-face mask at 17 cwp with heated humidifier and 2 lpm supplemental oxygen.  dx: severe obstructive sleep apnea (see attached sleep study). 21)  Nu-iron 150 Mg Caps (Polysaccharide iron complex) .... Take 1 tab by mouth at bedtime 22)  Omeprazole 20 Mg Cpdr (Omeprazole) .... Take 1 capsule by mouth once a day 23)  Magnesium 500 Mg Tabs (Magnesium) .... Once daily 24)  Calcium Carbonate-vitamin D 600-400 Mg-unit Tabs (Calcium carbonate-vitamin d) .... Take 1 tablet by mouth two times a day  Other Orders: Est. Patient Level IV (16109) T-2 View CXR (71020TC) Flu Vaccine 48yrs + (60454) Admin 1st Vaccine (09811) Depo- Medrol 40mg  (J1030) Depo- Medrol 80mg  (J1040) Admin of Therapeutic Inj  intramuscular or subcutaneous (91478)  Patient Instructions: 1)  Flu vaccine today 2)  Depomedrol 120mg  IM was given 3)  Use fluticasone daily two sprays each nostril 4)  Remember to inhale slowly on the Advair 5)  A chest xray will be obtained 6)  Return in 6 months   Immunizations Administered:  Influenza Vaccine # 1:    Vaccine Type: Fluvax 3+    Site: left deltoid    Mfr: GlaxoSmithKline    Dose: 0.5 ml    Route: IM    Given by: Gweneth Dimitri RN    Exp. Date: 06/05/2011    Lot #: [id number]    VIS given: 06/30/10 version given October 28, 2010.  Flu Vaccine Consent Questions:    Do you have a history of severe allergic reactions to this vaccine? no    Any prior history of allergic reactions to egg and/or gelatin? no    Do you have a sensitivity to the preservative Thimersol? no    Do you have a past history of  Guillan-Barre Syndrome? no    Do you currently have an acute febrile illness? no    Have you ever had a severe reaction to latex? no    Vaccine information given and explained to patient? yes    Are you currently pregnant? no   Prevention & Chronic Care Immunizations   Influenza vaccine: Fluvax 3+  (10/28/2010)   Influenza vaccine due: 08/06/2010    Tetanus booster: Not documented   Td booster deferral: Deferred  (09/02/2009)    Pneumococcal vaccine: Pneumovax  (11/12/2009)   Pneumococcal vaccine deferral: Deferred  (09/02/2009)    H. zoster vaccine: Not documented  Colorectal Screening   Hemoccult: Not documented   Hemoccult action/deferral: Deferred  (09/02/2009)    Colonoscopy: Impression: Diverticulosis in the sigmoid colon and in the descending colon. Two 4 to 6 mm polyps in the sigmoid colon.  Resected and retrieved. Exam by: Everardo All. Madilyn Fireman, MD, [facility]  Pathology: Hyperplastic polyps, no adenomatous change or malignancy identified.  (07/17/2008)   Colonoscopy due: 07/17/2013  Other Screening   Pap smear: Not documented   Pap smear action/deferral: Not indicated S/P hysterectomy  (06/20/2009)    Mammogram: BI-RADS CATEGORY 2:  Benign finding(s).^MM DIGITAL DIAGNOSTIC BILAT LTD  (11/04/2009)   Mammogram due: 10/22/2010    DXA bone density scan: Osteopenia. Lumbar spine T score -1.3 Left femur neck T score -1.8 Right femur neck T score -0.8  (10/23/2009)   DXA bone density action/deferral: Ordered  (09/02/2009)   Smoking status: quit >  6 months  (10/28/2010)  Lipids   Total Cholesterol: 247  (09/24/2009)   Lipid panel action/deferral: Lipid Panel ordered   LDL: 143  (09/24/2009)   LDL Direct: Not documented   HDL: 67  (09/24/2009)   Triglycerides: 184  (09/24/2009)    SGOT (AST): 22  (12/10/2009)   SGPT (ALT): 20  (12/10/2009)   Alkaline phosphatase: 89  (12/10/2009)   Total bilirubin: 0.3  (12/10/2009)  Hypertension   Last Blood  Pressure: 126 / 80  (10/28/2010)   Serum creatinine: 0.53  (12/10/2009)   Serum potassium 3.7  (12/10/2009)  Self-Management Support :   Personal Goals (by the next clinic visit) :      Personal blood pressure goal: 140/90  (09/02/2009)     Personal LDL goal: 130  (09/02/2009)    Hypertension self-management support: Education handout, Resources for patients handout, Written self-care plan  (02/04/2010)    Lipid self-management support: Education handout, Resources for patients handout, Written self-care plan  (02/04/2010)    Nursing Instructions: Give Flu vaccine today     Medication Administration  Injection # 1:    Medication: Depo- Medrol 40mg     Diagnosis: ALLERGIC RHINITIS (ICD-477.9)    Route: IM    Site: LUOQ gluteus    Exp Date: 04/2013    Lot #: [id number]    Mfr: [employer]    Patient tolerated injection without complications    Given by: Gweneth Dimitri RN (October 28, 2010 4:58 PM)  Injection # 2:    Medication: Depo- Medrol 80mg     Diagnosis: ALLERGIC RHINITIS (ICD-477.9)    Route: IM    Site: LUOQ gluteus    Exp Date: 04/2013    Lot #: [id number]    Mfr: [employer]    Patient tolerated injection without complications    Given by: Gweneth Dimitri RN (October 28, 2010 4:58 PM)  Orders Added: 1)  Est. Patient Level IV [40981] 2)  T-2 View CXR [71020TC] 3)  Flu Vaccine 40yrs + [90658] 4)  Admin 1st Vaccine [90471] 5)  Depo- Medrol 40mg  [J1030] 6)  Depo- Medrol 80mg  [J1040] 7)  Admin of Therapeutic Inj  intramuscular or subcutaneous [19147]

## 2011-01-05 NOTE — Progress Notes (Signed)
Summary: refill/gg  Phone Note Refill Request  on February 12, 2010 11:34 AM  Refills Requested: Medication #1:  LIDODERM 5 % PTCH Apply 1 patch to skin once daily as directed to affected area; leave on for no more than 12 hours every day   Last Refilled: 01/18/2010  Method Requested: Electronic Initial call taken by: Merrie Roof RN,  February 12, 2010 11:34 AM  Follow-up for Phone Call        Refilled electronically.  Follow-up by: Margarito Liner MD,  February 12, 2010 6:49 PM    Prescriptions: LIDODERM 5 % PTCH (LIDOCAINE) Apply 1 patch to skin once daily as directed to affected area; leave on for no more than 12 hours every day  #30 x 3   Entered and Authorized by:   Margarito Liner MD   Signed by:   Margarito Liner MD on 02/12/2010   Method used:   Electronically to        [facility] [address] #[id number]* (retail)       [address] [place], [place]  [address]       Ph: [phone]       Fax: [phone]   RxID:   [id number]

## 2011-01-05 NOTE — Progress Notes (Signed)
Summary: Refill request for alprazolam  Phone Note Refill Request Message from:  Pharmacy  Refills Requested: Medication #1:  ALPRAZOLAM 0.5 MG TABS Take 1 to 11/2  tablets by mouth three times a day as needed for anxiety   Last Refilled: 01/20/2010 Electronic refill request received from pharmacy.  Initial call taken by: Margarito Liner MD,  February 16, 2010 3:01 PM  Follow-up for Phone Call        Refill approved-nurse to complete. Follow-up by: Margarito Liner MD,  February 16, 2010 3:03 PM  Additional Follow-up for Phone Call Additional follow up Details #1::        Rx called to pharmacy Additional Follow-up by: Merrie Roof RN,  February 16, 2010 3:20 PM    Prescriptions: ALPRAZOLAM 0.5 MG TABS (ALPRAZOLAM) Take 1 to 11/2  tablets by mouth three times a day as needed for anxiety  #120 x 3   Entered and Authorized by:   Margarito Liner MD   Signed by:   Margarito Liner MD on 02/16/2010   Method used:   Telephoned to ...       [facility] #[id number]* (retail)       [address] [place], [place]  [address]       Ph: [phone]       Fax: [phone]   RxID:   [id number]

## 2011-01-05 NOTE — Progress Notes (Signed)
Summary: Alprazolam refill request  Phone Note Refill Request Message from:  Pharmacy  Refills Requested: Medication #1:  ALPRAZOLAM 0.5 MG TABS Take 1 to 11/2  tablets by mouth three times a day as needed for anxiety   Last Refilled: 05/06/2010 Received electronic refill request from pharmacy for alprazolam.  Initial call taken by: Margarito Liner MD,  June 03, 2010 3:11 PM  Follow-up for Phone Call        Refill approved-nurse to complete. Follow-up by: Margarito Liner MD,  June 03, 2010 3:12 PM  Additional Follow-up for Phone Call Additional follow up Details #1::        Rx called to pharmacy Additional Follow-up by: Marin Roberts RN,  June 03, 2010 3:16 PM    Prescriptions: ALPRAZOLAM 0.5 MG TABS (ALPRAZOLAM) Take 1 to 11/2  tablets by mouth three times a day as needed for anxiety  #120 x 3   Entered and Authorized by:   Margarito Liner MD   Signed by:   Margarito Liner MD on 06/03/2010   Method used:   Telephoned to ...       [facility] #[id number]* (retail)       [address] [place], [place]  [address]       Ph: [phone]       Fax: [phone]   RxID:   [id number]

## 2011-01-05 NOTE — Miscellaneous (Signed)
Summary: Discharge Summary For PT  Discharge Summary For PT   Imported By: Shon Hough 06/26/2010 15:22:09  _____________________________________________________________________  External Attachment:    Type:   Image     Comment:   External Document

## 2011-01-06 ENCOUNTER — Ambulatory Visit: Admit: 2011-01-06 | Payer: Self-pay | Admitting: Internal Medicine

## 2011-01-06 ENCOUNTER — Encounter: Payer: Self-pay | Admitting: Internal Medicine

## 2011-01-06 ENCOUNTER — Ambulatory Visit (INDEPENDENT_AMBULATORY_CARE_PROVIDER_SITE_OTHER): Payer: Medicare Other | Admitting: Internal Medicine

## 2011-01-06 DIAGNOSIS — E876 Hypokalemia: Secondary | ICD-10-CM

## 2011-01-06 DIAGNOSIS — G4733 Obstructive sleep apnea (adult) (pediatric): Secondary | ICD-10-CM

## 2011-01-06 DIAGNOSIS — Z1231 Encounter for screening mammogram for malignant neoplasm of breast: Secondary | ICD-10-CM

## 2011-01-06 DIAGNOSIS — Z1239 Encounter for other screening for malignant neoplasm of breast: Secondary | ICD-10-CM

## 2011-01-06 DIAGNOSIS — D649 Anemia, unspecified: Secondary | ICD-10-CM

## 2011-01-06 DIAGNOSIS — K219 Gastro-esophageal reflux disease without esophagitis: Secondary | ICD-10-CM

## 2011-01-06 DIAGNOSIS — IMO0001 Reserved for inherently not codable concepts without codable children: Secondary | ICD-10-CM

## 2011-01-06 DIAGNOSIS — I10 Essential (primary) hypertension: Secondary | ICD-10-CM

## 2011-01-06 DIAGNOSIS — J449 Chronic obstructive pulmonary disease, unspecified: Secondary | ICD-10-CM

## 2011-01-06 DIAGNOSIS — F329 Major depressive disorder, single episode, unspecified: Secondary | ICD-10-CM

## 2011-01-06 LAB — COMPREHENSIVE METABOLIC PANEL
ALT: 18 U/L (ref 0–35)
AST: 25 U/L (ref 0–37)
Alkaline Phosphatase: 74 U/L (ref 39–117)
Glucose, Bld: 107 mg/dL — ABNORMAL HIGH (ref 70–99)
Potassium: 3.3 mEq/L — ABNORMAL LOW (ref 3.5–5.3)
Sodium: 130 mEq/L — ABNORMAL LOW (ref 135–145)
Total Bilirubin: 0.4 mg/dL (ref 0.3–1.2)
Total Protein: 6.9 g/dL (ref 6.0–8.3)

## 2011-01-06 LAB — CBC WITH DIFFERENTIAL/PLATELET
Basophils Relative: 0 % (ref 0–1)
Eosinophils Absolute: 0.1 10*3/uL (ref 0.0–0.7)
Eosinophils Relative: 1 % (ref 0–5)
Lymphs Abs: 3.5 10*3/uL (ref 0.7–4.0)
MCH: 32.8 pg (ref 26.0–34.0)
MCHC: 33.3 g/dL (ref 30.0–36.0)
MCV: 98.5 fL (ref 78.0–100.0)
Platelets: 285 10*3/uL (ref 150–400)
RBC: 3.93 MIL/uL (ref 3.87–5.11)

## 2011-01-06 MED ORDER — OMEPRAZOLE 20 MG PO CPDR: 20.0000 mg | DELAYED_RELEASE_CAPSULE | Freq: Every day | ORAL | Status: DC

## 2011-01-06 MED ORDER — HYDROCODONE-ACETAMINOPHEN 7.5-750 MG PO TABS: 1.0000 | ORAL_TABLET | Freq: Four times a day (QID) | ORAL | Status: DC | PRN

## 2011-01-06 MED ORDER — MECLIZINE HCL 12.5 MG PO TABS: 12.5000 mg | ORAL_TABLET | Freq: Two times a day (BID) | ORAL | Status: DC | PRN

## 2011-01-06 NOTE — Progress Notes (Signed)
  Subjective:    Patient ID: Jennifer [name], female    DOB: Feb 16, 1941, 70 y.o.   MRN: [id number]  HPI Patient returns for follow-up of her hypertension, COPD, fibromyalgia, and other chronic medical problems.  She is doing well without any acute complaints.  She reports that she is compliant with her medications.   Review of Systems  Constitutional: Negative for fever and chills.  Respiratory: Negative for cough.        No cough since lisinopril stopped by Dr. Sherene Sires.  Cardiovascular: Negative for chest pain.  Gastrointestinal: Negative for vomiting and abdominal pain.       Objective:   Physical Exam  Cardiovascular: Normal rate, regular rhythm and normal heart sounds.   Pulmonary/Chest: Effort normal and breath sounds normal. No respiratory distress. She has no wheezes. She has no rales.  Abdominal: Soft. Bowel sounds are normal. There is no tenderness.  Musculoskeletal: She exhibits no edema.          Assessment & Plan:

## 2011-01-06 NOTE — Patient Instructions (Signed)
Please keep appointment for mammogram.

## 2011-01-07 ENCOUNTER — Encounter: Payer: Self-pay | Admitting: Internal Medicine

## 2011-01-07 NOTE — Assessment & Plan Note (Signed)
Patient is on an oral potassium supplement; will check labs today.

## 2011-01-07 NOTE — Assessment & Plan Note (Signed)
Blood pressure is well controled on current medications.    BP Readings from Last 3 Encounters:  01/06/11 124/87  11/23/10 132/84  11/12/10 130/80

## 2011-01-07 NOTE — Progress Notes (Addendum)
Summary: refill/gg  Phone Note Refill Request  on December 02, 2010 2:59 PM  Refills Requested: Medication #1:  VICODIN ES 7.5-750 MG TABS Take 1 tablet by mouth four times a day as needed for pain  Method Requested: Telephone to Pharmacy Initial call taken by: Merrie Roof RN,  December 02, 2010 2:59 PM  Follow-up for Phone Call        I called pharmacy and Rx from 07/07/10 was never received by pharmacy.  this Rx had 3 refills but pt never received. I talked with Dr Phillips Odor and will call in 1 month from this old Rx with no refills. It seems the pharmacy has been giving pt vicodin 5 mg instead of vicodin 7.5 mg. I have called  in vicodin 7.5/750 # 120 with no refills. Follow-up by: Merrie Roof RN,  December 02, 2010 3:54 PM

## 2011-01-07 NOTE — Assessment & Plan Note (Signed)
Summary: TETANUS/SB.  Nurse Visit   Allergies: 1)  ! Tetracycline  Immunizations Administered:  Tetanus Vaccine:    Vaccine Type: Tdap    Site: left deltoid    Mfr: GlaxoSmithKline    Dose: 0.5 ml    Route: IM    Given by: Merrie Roof RN    Exp. Date: 09/24/2012    Lot #: [id number]    VIS given: 10/23/08 version given November 18, 2010.  Orders Added: 1)  Tdap => 15yrs IM [90715] 2)  Admin 1st Vaccine [90471]   Prevention & Chronic Care Immunizations   Influenza vaccine: Fluvax 3+  (10/28/2010)   Influenza vaccine due: 08/06/2010    Tetanus booster: 11/18/2010: Tdap   Td booster deferral: Deferred  (09/02/2009)    Pneumococcal vaccine: Pneumovax  (11/12/2009)   Pneumococcal vaccine deferral: Deferred  (09/02/2009)    H. zoster vaccine: Not documented          Nursing Instructions: Give tetanus booster (Tdap) today Margarito Liner MD  November 18, 2010 3:57 PM

## 2011-01-07 NOTE — Assessment & Plan Note (Signed)
Patient is asymptomatic; will check a CBC.

## 2011-01-07 NOTE — Assessment & Plan Note (Signed)
Summary: EST-CK/FU/MEDS/CFB   Vital Signs:  Patient profile:   70 year old female Height:      63 inches Weight:      239.9 pounds BMI:     42.65 Temp:     97.0 degrees F oral Pulse rate:   80 / minute BP sitting:   130 / 80  (right arm)  Vitals Entered By: Filomena Jungling NT II (November 12, 2010 3:50 PM) CC: HFU, Depression Is Patient Diabetic? No Pain Assessment Patient in pain? no      Nutritional Status BMI of > 30 = obese  Have you ever been in a relationship where you felt threatened, hurt or afraid?No   Does patient need assistance? Functional Status Self care Ambulation Impaired:Risk for fall Comments walks with cane   Primary Care Provider:  Margarito Liner MD  CC:  HFU and Depression.  History of Present Illness: Patient returns for followup of a recent hospitalization on the internal medicine service from November 30 through December 2 for urosepsis, and for followup of her chronic medical problems. She reports that she has done well since her discharge home. She is currently completing a course of oral ciprofloxacin. She has no acute complaints. She reports that she is compliant with her medications.  Depression History:      The patient denies a depressed mood most of the day and a diminished interest in her usual daily activities.         Preventive Screening-Counseling & Management  Alcohol-Tobacco     Alcohol drinks/day: 0     Smoking Status: quit > 6 months     Smoking Cessation Counseling: yes     Packs/Day: 1/2     Year Started: 2008     Year Quit: 12/2008     Pack years: 32  Caffeine-Diet-Exercise     Does Patient Exercise: yes     Type of exercise: walking (about 15-20mins)     Times/week: 1  -  Date:  11/06/2010    BG Random: 155    BUN: 4    Creatinine: 0.69    Sodium: 138    Potassium: 3.4    Chloride: 100    CO2 Total: 30    Calcium: 8.4  Date:  11/05/2010    Cholesterol: 170    LDL: 89    HDL: 56    Triglycerides:  126  Current Medications (verified): 1)  Vicodin Es 7.5-750 Mg Tabs (Hydrocodone-Acetaminophen) .... Take 1 Tablet By Mouth Four Times A Day As Needed For Pain 2)  Spiriva Handihaler 18 Mcg Caps (Tiotropium Bromide Monohydrate) .... Inhale Contents of 1 Capsule Once A Day 3)  Advair Diskus 250-50 Mcg/dose Misc (Fluticasone-Salmeterol) .... Inhale 1 Puff Two Times A Day 4)  Soma 350 Mg Tabs (Carisoprodol) .... Take 1 Tablet By Mouth Four Times A Day As Needed For Muscle Spasm 5)  Zoloft 100 Mg Tabs (Sertraline Hcl) .... Take 1 Tablet By Mouth Two Times A Day 6)  Alprazolam 0.5 Mg Tabs (Alprazolam) .... Take 1 To 11/2  Tablets By Mouth Three Times A Day As Needed For Anxiety 7)  Klor-Con 10 10 Meq Cr-Tabs (Potassium Chloride) .... Take 2 Tablets By Mouth Once A Day 8)  Amitriptyline Hcl 150 Mg Tabs (Amitriptyline Hcl) .... Take 1 Tablet By Mouth Once A Day 9)  Fluticasone Propionate 0.05 % Crea (Fluticasone Propionate) .... Take 2 Sprays in Each Nostril Once A Day 10)  Proair Hfa 108 (90 Base) Mcg/act Aers (  Albuterol Sulfate) .... Inhale 2 Puffs Four Times A Day As Needed 11)  Lidoderm 5 % Ptch (Lidocaine) .... Apply 1 Patch To Skin Once Daily As Directed To Affected Area; Leave On For No More Than 12 Hours Every Day 12)  Aspirin 81 Mg Tbec (Aspirin) .... Take 1 Tablet By Mouth Once A Day 13)  Ibuprofen 200 Mg Tabs (Ibuprofen) .... Take 2 Tablets By Mouth Two Times A Day As Needed For Pain 14)  Hydrochlorothiazide 25 Mg  Tabs (Hydrochlorothiazide) .... Take 1 Tablet By Mouth Once A Day 15)  Antivert 12.5 Mg Tabs (Meclizine Hcl) .... Take 1 Tablet By Mouth Two Times A Day As Needed For Vertigo 16)  Red Yeast Rice Extract 600 Mg Caps (Red Yeast Rice Extract) .... Take 1 Tablet By Mouth Once A Day 17)  Mucinex 600 Mg Tb12 (Guaifenesin) .... Take 1 Tablet By Mouth Two Times A Day 18)  Oxygen .... 2l At Bedtime 19)  Cpap .... Resmed Quattro Medium Full-Face Mask At 17 Cwp With Heated Humidifier and 2  Lpm Supplemental Oxygen.  Dx: Severe Obstructive Sleep Apnea (See Attached Sleep Study). 20)  Nu-Iron 150 Mg  Caps (Polysaccharide Iron Complex) .... Take 1 Tab By Mouth At Bedtime 21)  Omeprazole 20 Mg  Cpdr (Omeprazole) .... Take 1 Capsule By Mouth Once A Day 22)  Magnesium 500 Mg Tabs (Magnesium) .... Once Daily 23)  Calcium Carbonate-Vitamin D 600-400 Mg-Unit Tabs (Calcium Carbonate-Vitamin D) .... Take 1 Tablet By Mouth Two Times A Day 24)  Lisinopril 5 Mg Tabs (Lisinopril) .... Take 1 Tablet By Mouth Once A Day 25)  Ciprofloxacin Hcl 500 Mg Tabs (Ciprofloxacin Hcl) .... Take 1 Tablet By Mouth Two Times A Day For 9 Days  Allergies (verified): 1)  ! Tetracycline  Review of Systems General:  Denies chills, fever, and sweats. CV:  Denies chest pain or discomfort. Resp:  Denies chest discomfort, chest pain with inspiration, and cough. GI:  Denies abdominal pain, nausea, and vomiting. GU:  Denies dysuria. Psych:  Denies anxiety and depression.  Physical Exam  General:  alert, no distress Lungs:  normal respiratory effort, normal breath sounds, no crackles, and no wheezes.   Heart:  normal rate, regular rhythm, no murmur, no gallop, and no rub.   Abdomen:  soft, non-tender, normal bowel sounds, no hepatomegaly, and no splenomegaly.   Extremities:  no edema   Impression & Recommendations:  Problem # 1:  Hosp for UROSEPSIS (ICD-599.0) Patient has done well since her discharge home, with no symptoms of recurrent infection. I advised her to complete her course of ciprofloxacin, and to let me know she has any problems.  Her updated medication list for this problem includes:    Ciprofloxacin Hcl 500 Mg Tabs (Ciprofloxacin hcl) .Marland Kitchen... Take 1 tablet by mouth two times a day for 9 days  Problem # 2:  HYPERTENSION (ICD-401.9) During patient's recent hospitalization, her Procardia XL was stopped and lisinopril was started.  Patient's blood pressure is at goal on current medication  regimen.  Her updated medication list for this problem includes:    Hydrochlorothiazide 25 Mg Tabs (Hydrochlorothiazide) .Marland Kitchen... Take 1 tablet by mouth once a day    Lisinopril 5 Mg Tabs (Lisinopril) .Marland Kitchen... Take 1 tablet by mouth once a day  BP today: 130/80 Prior BP: 126/80 (10/28/2010)  Prior 10 Yr Risk Heart Disease: 13 % (09/02/2009)  Labs Reviewed: K+: 3.7 (12/10/2009) Creat: : 0.53 (12/10/2009)   Chol: 247 (09/24/2009)   HDL: 67 (  09/24/2009)   LDL: 143 (09/24/2009)   TG: 184 (09/24/2009)  Problem # 3:  COPD (ICD-496) Patient is doing well on current inhaler regimen.  Her updated medication list for this problem includes:    Spiriva Handihaler 18 Mcg Caps (Tiotropium bromide monohydrate) ..... Inhale contents of 1 capsule once a day    Advair Diskus 250-50 Mcg/dose Misc (Fluticasone-salmeterol) ..... Inhale 1 puff two times a day    Proair Hfa 108 (90 Base) Mcg/act Aers (Albuterol sulfate) ..... Inhale 2 puffs four times a day as needed  Pulmonary Functions Reviewed: O2 sat: 96 (10/28/2010)     Vaccines Reviewed: Pneumovax: Pneumovax (11/12/2009)   Flu Vax: Fluvax 3+ (10/28/2010)  Problem # 4:  DYSLIPIDEMIA (ICD-272.4) Patient's LDL was checked during recent hospitalization and was at goal.  Will check a metabolic panel.  Labs Reviewed: SGOT: 22 (12/10/2009)   SGPT: 20 (12/10/2009)  Lipid Goals: Chol Goal: 200 (09/02/2009)   HDL Goal: 40 (09/02/2009)   LDL Goal: 130 (09/02/2009)   TG Goal: 150 (09/02/2009)  Prior 10 Yr Risk Heart Disease: 13 % (09/02/2009)   HDL:67 (09/24/2009), 56 (09/12/2008)  LDL:143 (09/24/2009), 122 (09/12/2008)  Chol:247 (09/24/2009), 232 (09/12/2008)  Trig:184 (09/24/2009), 268 (09/12/2008)  Problem # 5:  DISORDER, DEPRESSIVE NEC (ICD-311) Patient is doing well on current regimen.  Her updated medication list for this problem includes:    Zoloft 100 Mg Tabs (Sertraline hcl) .Marland Kitchen... Take 1 tablet by mouth two times a day    Alprazolam 0.5 Mg Tabs  (Alprazolam) .Marland Kitchen... Take 1 to 11/2  tablets by mouth three times a day as needed for anxiety    Amitriptyline Hcl 150 Mg Tabs (Amitriptyline hcl) .Marland Kitchen... Take 1 tablet by mouth once a day  Problem # 6:  HYPERGLYCEMIA (ICD-790.29) Patient had moderately elevated blood sugars noted during her recent hospitalization. She does not have an established diagnosis of diabetes. Will check a metabolic panel and a hemoglobin A1c.  Future Orders: T-Hgb A1C (in-house) ([id number]) ... 11/16/2010  Complete Medication List: 1)  Vicodin Es 7.5-750 Mg Tabs (Hydrocodone-acetaminophen) .... Take 1 tablet by mouth four times a day as needed for pain 2)  Spiriva Handihaler 18 Mcg Caps (Tiotropium bromide monohydrate) .... Inhale contents of 1 capsule once a day 3)  Advair Diskus 250-50 Mcg/dose Misc (Fluticasone-salmeterol) .... Inhale 1 puff two times a day 4)  Soma 350 Mg Tabs (Carisoprodol) .... Take 1 tablet by mouth four times a day as needed for muscle spasm 5)  Zoloft 100 Mg Tabs (Sertraline hcl) .... Take 1 tablet by mouth two times a day 6)  Alprazolam 0.5 Mg Tabs (Alprazolam) .... Take 1 to 11/2  tablets by mouth three times a day as needed for anxiety 7)  Klor-con 10 10 Meq Cr-tabs (Potassium chloride) .... Take 2 tablets by mouth once a day 8)  Amitriptyline Hcl 150 Mg Tabs (Amitriptyline hcl) .... Take 1 tablet by mouth once a day 9)  Fluticasone Propionate 0.05 % Crea (Fluticasone propionate) .... Take 2 sprays in each nostril once a day 10)  Proair Hfa 108 (90 Base) Mcg/act Aers (Albuterol sulfate) .... Inhale 2 puffs four times a day as needed 11)  Lidoderm 5 % Ptch (Lidocaine) .... Apply 1 patch to skin once daily as directed to affected area; leave on for no more than 12 hours every day 12)  Aspirin 81 Mg Tbec (Aspirin) .... Take 1 tablet by mouth once a day 13)  Ibuprofen 200 Mg Tabs (Ibuprofen) .... Take 2  tablets by mouth two times a day as needed for pain 14)  Hydrochlorothiazide 25 Mg Tabs  (Hydrochlorothiazide) .... Take 1 tablet by mouth once a day 15)  Antivert 12.5 Mg Tabs (Meclizine hcl) .... Take 1 tablet by mouth two times a day as needed for vertigo 16)  Red Yeast Rice Extract 600 Mg Caps (Red yeast rice extract) .... Take 1 tablet by mouth once a day 17)  Mucinex 600 Mg Tb12 (Guaifenesin) .... Take 1 tablet by mouth two times a day 18)  Oxygen  .... 2l at bedtime 19)  Cpap  .... Resmed quattro medium full-face mask at 17 cwp with heated humidifier and 2 lpm supplemental oxygen.  dx: severe obstructive sleep apnea (see attached sleep study). 20)  Nu-iron 150 Mg Caps (Polysaccharide iron complex) .... Take 1 tab by mouth at bedtime 21)  Omeprazole 20 Mg Cpdr (Omeprazole) .... Take 1 capsule by mouth once a day 22)  Magnesium 500 Mg Tabs (Magnesium) .... Once daily 23)  Calcium Carbonate-vitamin D 600-400 Mg-unit Tabs (Calcium carbonate-vitamin d) .... Take 1 tablet by mouth two times a day 24)  Lisinopril 5 Mg Tabs (Lisinopril) .... Take 1 tablet by mouth once a day 25)  Ciprofloxacin Hcl 500 Mg Tabs (Ciprofloxacin hcl) .... Take 1 tablet by mouth two times a day for 9 days  Other Orders: Future Orders: T-Comprehensive Metabolic Panel ([id number]) ... 11/16/2010 T-CBC w/Diff ([id number]) ... 11/16/2010   Orders Added: 1)  T-Comprehensive Metabolic Panel [[id number]] 2)  T-CBC w/Diff [[id number]] 3)  T-Hgb A1C (in-house) [[id number]] 4)  Est. Patient Level III [62952]    Prevention & Chronic Care Immunizations   Influenza vaccine: Fluvax 3+  (10/28/2010)   Influenza vaccine due: 08/06/2010    Tetanus booster: Not documented   Td booster deferral: Deferred  (09/02/2009)    Pneumococcal vaccine: Pneumovax  (11/12/2009)   Pneumococcal vaccine deferral: Deferred  (09/02/2009)    H. zoster vaccine: Not documented  Colorectal Screening   Hemoccult: Not documented   Hemoccult action/deferral: Deferred  (09/02/2009)    Colonoscopy: Impression: Diverticulosis  in the sigmoid colon and in the descending colon. Two 4 to 6 mm polyps in the sigmoid colon.  Resected and retrieved. Exam by: Everardo All. Madilyn Fireman, MD, [facility]  Pathology: Hyperplastic polyps, no adenomatous change or malignancy identified.  (07/17/2008)   Colonoscopy due: 07/17/2013  Other Screening   Pap smear: Not documented   Pap smear action/deferral: Not indicated S/P hysterectomy  (06/20/2009)    Mammogram: BI-RADS CATEGORY 2:  Benign finding(s).^MM DIGITAL DIAGNOSTIC BILAT LTD  (11/04/2009)   Mammogram due: 10/22/2010    DXA bone density scan: Osteopenia. Lumbar spine T score -1.3 Left femur neck T score -1.8 Right femur neck T score -0.8  (10/23/2009)   DXA bone density action/deferral: Ordered  (09/02/2009)   Smoking status: quit > 6 months  (11/12/2010)    Screening comments: Patient reports that she will have mammogram done in near future  Lipids   Total Cholesterol: 170  (11/05/2010)   Lipid panel action/deferral: Lipid Panel ordered   LDL: 89  (11/05/2010)   LDL Direct: Not documented   HDL: 56  (11/05/2010)   Triglycerides: 184  (09/24/2009)    SGOT (AST): 22  (12/10/2009)   BMP action: Ordered   SGPT (ALT): 20  (12/10/2009) CMP ordered    Alkaline phosphatase: 89  (12/10/2009)   Total bilirubin: 0.3  (12/10/2009)    Lipid flowsheet reviewed?: Yes   Progress toward LDL goal:  At goal  Hypertension   Last Blood Pressure: 130 / 80  (11/12/2010)   Serum creatinine: 0.69  (11/06/2010)   BMP action: Ordered   Serum potassium 3.4  (11/06/2010) CMP ordered     Hypertension flowsheet reviewed?: Yes   Progress toward BP goal: At goal  Self-Management Support :   Personal Goals (by the next clinic visit) :      Personal blood pressure goal: 140/90  (09/02/2009)     Personal LDL goal: 130  (09/02/2009)    Patient will work on the following items until the next clinic visit to reach self-care goals:     Medications and monitoring: take my  medicines every day, bring all of my medications to every visit  (11/12/2010)     Eating: drink diet soda or water instead of juice or soda, eat more vegetables, use fresh or frozen vegetables, eat foods that are low in salt, eat baked foods instead of fried foods, eat fruit for snacks and desserts  (11/12/2010)    Hypertension self-management support: Education handout, Resources for patients handout, Written self-care plan  (02/04/2010)    Lipid self-management support: Education handout, Resources for patients handout, Written self-care plan  (02/04/2010)    Process Orders Check Orders Results:     [facility]: ABN not required for this insurance Tests Sent for requisitioning (November 13, 2010 6:36 PM):     11/16/2010: [facility] -- T-Comprehensive Metabolic Panel [[id number]] (signed)     11/16/2010: [facility] -- [facility] w/Diff [[id number]] (signed)

## 2011-01-07 NOTE — Progress Notes (Signed)
Summary: cough  Phone Note Call from Patient Call back at Home Phone [phone]   Caller: Daughter//beverly Call For: wright Summary of Call: Pt c/o sleeplessness, persistent cough x2 wks, getting worse using  her meds not helping pls advise.//[facility] Initial call taken by: Darletta Moll,  November 20, 2010 2:32 PM  Follow-up for Phone Call        Spoke with pt's daughter.  She states that pt has had dry hacky cough x 2 wks- Coughs "all the time", states that she gets "choked" and has trouble with breathing when has these spells. She is wanting something called in for the cough.  I offered ov and she refused.  Pls advise *looks like she was started on ACE since last ov with PW on 10/29/10.  Will forward to doc of the day.  Pls advise thanks Follow-up by: Vernie Murders,  November 20, 2010 3:35 PM  Additional Follow-up for Phone Call Additional follow up Details #1::        agree tht likely due to ace inhiibitors started 11/05/2010 by inpatient team.  Please have her stop it and come to office next week. DR Meredith Pel her PMD should know this. I am sending cc to him in case he wants to do an alternative Additional Follow-up by: Kalman Shan MD,  November 20, 2010 4:01 PM    Additional Follow-up for Phone Call Additional follow up Details #2::    Spoke with pt's daughter and made aware of recs per MR.  She verbalized understanding.  I advised pt avoid salt and sched appt with MW for monday 11/23/10 at 10:30 am and advised bring all meds in hand with her to appt.  Daughter verbalized understanding. Follow-up by: Vernie Murders,  November 20, 2010 4:10 PM

## 2011-01-07 NOTE — Assessment & Plan Note (Signed)
Pain is well controlled on current regimen.

## 2011-01-07 NOTE — Assessment & Plan Note (Signed)
Patient is doing well on current inhaler regimen.

## 2011-01-07 NOTE — Assessment & Plan Note (Signed)
Symptoms are controlled on current dose of omeprazole; will continue at current dose.

## 2011-01-07 NOTE — Assessment & Plan Note (Signed)
Patient reports that she is using CPAP without problems.

## 2011-01-07 NOTE — Assessment & Plan Note (Signed)
Patient is doing well with no depressive symptoms; plan is to continue current regimen.

## 2011-01-07 NOTE — Assessment & Plan Note (Signed)
Summary: Pulmonary/ ext ov ACE effect / pseudowheeze   Primary Provider/Referring Provider:  Margarito Liner MD  CC:  Cough x 10 days- dry and hacky.  History of Present Illness: 69-yowf last smoked in 2009  with chronic obstructive lung disease, asthmatic bronchitic component.    May 14, 2009  Off cigs since 1/10.  Has dry cough and gets hoarse.  No wheeze.  No chestpain. Pt denies any significant sore throat, nasal congestion or excess secretions, fever, chills, sweats, unintended weight loss, pleurtic or exertional chest pain, orthopnea PND, or leg swelling Pt denies any increase in rescue therapy over baseline, denies waking up needing it or having any early am or nocturnal exacerbations of coughing/wheezing/or dyspnea.  November 12, 2009  Still off cigarettes. Still with dry cough at night.  No mucous is seen.  CPAP has humidifier.   No heartburn.  Still hoarseness.    March 13, 2010  Since last ov more dyspneic,  but goes to PT for more mobility.  Notes sl amount of wheezing for one month and throat  is dry.  No heartburn.  Some cough at night.  No sore throat.  Some pndrip.  Nose is stopped up.  Difficulty with cpap mask.     October 28, 2010  Last two weeks is more hoarse,  worse in the evening and in the AM cough is worse and is dry.  has to clear the throat. Has to cough all night long.  Uses cpap at night. Cpap has helped the sleeping disorder, there is less daytime hypersomnolence.    rec work dpi technique, rinse and gargle afterwards, cough and hoarseness  November 23, 2010 ov discharge 10 days prior to ov now on ACE with worse coughing dry and hacky and worse sob. Pt denies any significant sore throat, dysphagia, itching, sneezing,  nasal congestion or excess secretions,  fever, chills, sweats, unintended wt loss, pleuritic or exertional cp, hempoptysis,   orthopnea pnd or leg swelling      Current Medications (verified): 1)  Vicodin Es 7.5-750 Mg Tabs  (Hydrocodone-Acetaminophen) .... Take 1 Tablet By Mouth Four Times A Day As Needed For Pain 2)  Spiriva Handihaler 18 Mcg Caps (Tiotropium Bromide Monohydrate) .... Inhale Contents of 1 Capsule Once A Day 3)  Advair Diskus 250-50 Mcg/dose Misc (Fluticasone-Salmeterol) .... Inhale 1 Puff Two Times A Day 4)  Soma 350 Mg Tabs (Carisoprodol) .... Take 1 Tablet By Mouth Four Times A Day As Needed For Muscle Spasm 5)  Zoloft 100 Mg Tabs (Sertraline Hcl) .... Take 1 Tablet By Mouth Two Times A Day 6)  Alprazolam 0.5 Mg Tabs (Alprazolam) .... Take 1 To 11/2  Tablets By Mouth Three Times A Day As Needed For Anxiety 7)  Klor-Con 10 10 Meq Cr-Tabs (Potassium Chloride) .... Take 2 Tablets By Mouth Once A Day 8)  Amitriptyline Hcl 150 Mg Tabs (Amitriptyline Hcl) .... Take 1 Tablet By Mouth Once A Day 9)  Fluticasone Propionate 0.05 % Crea (Fluticasone Propionate) .... Take 2 Sprays in Each Nostril Once A Day 10)  Proair Hfa 108 (90 Base) Mcg/act Aers (Albuterol Sulfate) .... Inhale 2 Puffs Four Times A Day As Needed 11)  Lidoderm 5 % Ptch (Lidocaine) .... Apply 1 Patch To Skin Once Daily As Directed To Affected Area; Leave On For No More Than 12 Hours Every Day 12)  Aspirin 81 Mg Tbec (Aspirin) .... Take 1 Tablet By Mouth Once A Day 13)  Ibuprofen 200 Mg Tabs (  Ibuprofen) .... Take 2 Tablets By Mouth Two Times A Day As Needed For Pain 14)  Hydrochlorothiazide 25 Mg  Tabs (Hydrochlorothiazide) .... Take 1 Tablet By Mouth Once A Day 15)  Antivert 12.5 Mg Tabs (Meclizine Hcl) .... Take 1 Tablet By Mouth Two Times A Day As Needed For Vertigo 16)  Red Yeast Rice Extract 600 Mg Caps (Red Yeast Rice Extract) .... Take 1 Tablet By Mouth Once A Day 17)  Mucinex 600 Mg Tb12 (Guaifenesin) .... Take 1 Tablet By Mouth Two Times A Day 18)  Oxygen .... 2l At Bedtime 19)  Cpap .... Resmed Quattro Medium Full-Face Mask At 17 Cwp With Heated Humidifier and 2 Lpm Supplemental Oxygen.  Dx: Severe Obstructive Sleep Apnea (See  Attached Sleep Study). 20)  Nu-Iron 150 Mg  Caps (Polysaccharide Iron Complex) .... Take 1 Tab By Mouth At Bedtime 21)  Omeprazole 20 Mg  Cpdr (Omeprazole) .... Take 1 Capsule By Mouth Once A Day 22)  Magnesium 500 Mg Tabs (Magnesium) .... Once Daily 23)  Calcium Carbonate-Vitamin D 600-400 Mg-Unit Tabs (Calcium Carbonate-Vitamin D) .... Take 1 Tablet By Mouth Two Times A Day  Allergies (verified): 1)  ! Tetracycline  Past History:  Past Medical History: COPD- on Home oxygen Hypertension     - ACE intol see ov November 23, 2010  Obesity Fibromyalgia Osteoarthritis Anxiety Pneumonia GERD OSA  Vital Signs:  Patient profile:   70 year old female Weight:      241 pounds O2 Sat:      96 % on Room air Temp:     97.9 degrees F oral Pulse rate:   79 / minute BP sitting:   132 / 84  (left arm)  Vitals Entered By: Vernie Murders (November 23, 2010 10:47 AM)  O2 Flow:  Room air  Physical Exam  Additional Exam:  amb wf  with hoarseness and prominent pseudowheeze resolves with purse lip maneuver  wt 239 > 241 November 23, 2010  Heent:  no jvd, no TMG, no cervical LNademopathy, orophyx clear,  nares with clear watery drainage. Cor: RRR nl s1/s2  no s3/s4  no m r h g Abd: soft NT BSA   no masses  No HSM  no rebound or guarding Ext perfused with no c v e v.d Neuro: intact, moves all 4s, CN II-XII intact, DTRs intact Chest: distant BS  no wheezes, rales, rhonchi   no egophony  no consolidative breath sounds, mild hyperresonance to percussion     Impression & Recommendations:  Problem # 1:  COPD (ICD-496)   DDX of  difficult airways managment all start with A and  include Adherence, Ace Inhibitors, Acid Reflux, Active Sinus Disease, Alpha 1 Antitripsin deficiency, Anxiety masquerading as Airways dz,  ABPA,  allergy(esp in young), Aspiration (esp in elderly), Adverse effects of DPI,  Active smokers, plus one B  = Beta blocker use..    Ace inhibitors the most likely cause of her  problem esp in combination with gerd and DPIs  See instructions for specific recommendations   Problem # 2:  HYPERTENSION (ICD-401.9)  The following medications were removed from the medication list:    Lisinopril 5 Mg Tabs (Lisinopril) .Marland Kitchen... Take 1 tablet by mouth once a day Her updated medication list for this problem includes:    Hydrochlorothiazide 25 Mg Tabs (Hydrochlorothiazide) .Marland Kitchen... Take 1 tablet by mouth once a day   ACE inhibitors are problematic in  pts with airway complaints because  even experienced pulmonologists can't  always distinguish ace effects from copd/asthma.  By themselves they don't actually cause a problem, much like oxygen can't by itself start a fire, but they certainly serve as a powerful catalyst or enhancer for any "fire"  or inflammatory process in the upper airway, be it caused by an ET  tube or more commonly reflux (especially in the obese or pts with known GERD or who are on biphoshonates).  In the era of ARB near equivalency until we have a better handle on the reversibility of the airway problem, it just makes sense to avoid ace entirely in the short run and then decide later, having established a level of airway control using a reasonable limited regimen, whether to add back ace but even then being very careful to observe the pt for worsening airway control and number of meds used/ needed to control symptoms.    Orders: Est. Patient Level IV (11914)  Patient Instructions: 1)  Stop lisinopril 2)  Restart procardia 90 mg one daily 3)  GERD (REFLUX)  is a common cause of respiratory symptoms. It commonly presents without heartburn and can be treated with medication, but also with lifestyle changes including avoidance of late meals, excessive alcohol, smoking cessation, and avoid fatty foods, chocolate, peppermint, colas, red wine, and acidic juices such as orange juice. NO MINT OR MENTHOL PRODUCTS SO NO COUGH DROPS  4)  USE SUGARLESS CANDY INSTEAD (jolley  ranchers)  5)  NO OIL BASED VITAMINS  6)  Add pepcid 20 mg one daily as long as coughing  7)  Take omeprazole ideally Take  one 30-60 min before first meal of the day

## 2011-01-08 ENCOUNTER — Telehealth: Payer: Self-pay | Admitting: *Deleted

## 2011-01-08 ENCOUNTER — Other Ambulatory Visit: Payer: Self-pay | Admitting: Internal Medicine

## 2011-01-08 DIAGNOSIS — E876 Hypokalemia: Secondary | ICD-10-CM

## 2011-01-08 DIAGNOSIS — M549 Dorsalgia, unspecified: Secondary | ICD-10-CM

## 2011-01-08 DIAGNOSIS — IMO0001 Reserved for inherently not codable concepts without codable children: Secondary | ICD-10-CM

## 2011-01-08 MED ORDER — POTASSIUM CHLORIDE 10 MEQ PO TBCR: 30.0000 meq | EXTENDED_RELEASE_TABLET | Freq: Every day | ORAL | Status: DC

## 2011-01-08 NOTE — Assessment & Plan Note (Signed)
Potassium is mildly low; plan is to increase KCl oral supplement to a dose of 30 meq daily, and check a BMET to follow up the low sodium and low potassium in 2 weeks.

## 2011-01-08 NOTE — Telephone Encounter (Signed)
Another phone call. Line is busy

## 2011-01-08 NOTE — Telephone Encounter (Signed)
Jennifer [name] has made lab appointment for 2/24 at 0930

## 2011-01-08 NOTE — Telephone Encounter (Signed)
Potassium is mildly low; plan is to increase KCl oral supplement to a dose of 30 meq daily, and check a BMET to follow up the low sodium and low potassium in 2 weeks. Nurse to contact patient and notify of change in dose and need for follow-up labs.  Pt called to inform above, line busy.

## 2011-01-11 NOTE — Telephone Encounter (Signed)
Refills approved - nurse to call in [place].

## 2011-01-12 NOTE — Telephone Encounter (Signed)
Line remains busy.  Will send letter to pt with appointment time and change in meds.

## 2011-01-13 ENCOUNTER — Encounter: Payer: Self-pay | Admitting: *Deleted

## 2011-01-13 NOTE — Progress Notes (Signed)
Letter was written and sent to pt with lab dates and increase in potassium.

## 2011-01-13 NOTE — Telephone Encounter (Signed)
Letter sent to pt with instructions

## 2011-01-29 ENCOUNTER — Other Ambulatory Visit: Payer: Medicare Other

## 2011-02-02 ENCOUNTER — Other Ambulatory Visit: Payer: Self-pay | Admitting: Internal Medicine

## 2011-02-08 ENCOUNTER — Ambulatory Visit (HOSPITAL_COMMUNITY): Payer: Medicare Other

## 2011-02-12 ENCOUNTER — Encounter: Payer: Self-pay | Admitting: Internal Medicine

## 2011-02-15 ENCOUNTER — Ambulatory Visit (HOSPITAL_COMMUNITY): Payer: Medicare Other | Attending: Internal Medicine

## 2011-02-15 LAB — LIPID PANEL
Cholesterol: 170 mg/dL (ref 0–200)
HDL: 56 mg/dL (ref >39)
LDL Cholesterol: 89 mg/dL (ref 0–99)
Total CHOL/HDL Ratio: 3 RATIO
VLDL: 25 mg/dL (ref 0–40)

## 2011-02-15 LAB — CBC
HCT: 33 % — ABNORMAL LOW (ref 36.0–46.0)
HCT: 33.1 % — ABNORMAL LOW (ref 36.0–46.0)
Hemoglobin: 10.9 g/dL — ABNORMAL LOW (ref 12.0–15.0)
Hemoglobin: 11.3 g/dL — ABNORMAL LOW (ref 12.0–15.0)
RBC: 3.38 MIL/uL — ABNORMAL LOW (ref 3.87–5.11)
RBC: 3.45 MIL/uL — ABNORMAL LOW (ref 3.87–5.11)
RDW: 12.9 % (ref 11.5–15.5)
WBC: 11.1 10*3/uL — ABNORMAL HIGH (ref 4.0–10.5)

## 2011-02-15 LAB — BASIC METABOLIC PANEL
CO2: 24 mEq/L (ref 19–32)
Calcium: 8.6 mg/dL (ref 8.4–10.5)
Chloride: 96 mEq/L (ref 96–112)
Creatinine, Ser: 0.77 mg/dL (ref 0.4–1.2)
GFR calc Af Amer: >60 mL/min (ref >60)
GFR calc Af Amer: >60 mL/min (ref >60)
GFR calc non Af Amer: >60 mL/min (ref >60)
Potassium: 3.4 mEq/L — ABNORMAL LOW (ref 3.5–5.1)
Sodium: 130 mEq/L — ABNORMAL LOW (ref 135–145)
Sodium: 138 mEq/L (ref 135–145)

## 2011-02-15 LAB — CARDIAC PANEL(CRET KIN+CKTOT+MB+TROPI)
CK, MB: 3.2 ng/mL (ref 0.3–4.0)
Total CK: 172 U/L (ref 7–177)
Troponin I: 0.47 ng/mL — ABNORMAL HIGH (ref 0.00–0.06)

## 2011-02-15 LAB — BRAIN NATRIURETIC PEPTIDE: Pro B Natriuretic peptide (BNP): 189 pg/mL — ABNORMAL HIGH (ref 0.0–100.0)

## 2011-02-15 LAB — MAGNESIUM: Magnesium: 1.6 mg/dL (ref 1.5–2.5)

## 2011-02-15 LAB — MRSA PCR SCREENING: MRSA by PCR: NEGATIVE

## 2011-02-16 LAB — DIFFERENTIAL
Lymphocytes Relative: 3 % — ABNORMAL LOW (ref 12–46)
Lymphs Abs: 0.3 10*3/uL — ABNORMAL LOW (ref 0.7–4.0)
Neutrophils Relative %: 95 % — ABNORMAL HIGH (ref 43–77)

## 2011-02-16 LAB — COMPREHENSIVE METABOLIC PANEL
AST: 26 U/L (ref 0–37)
CO2: 25 mEq/L (ref 19–32)
Calcium: 9.1 mg/dL (ref 8.4–10.5)
Creatinine, Ser: 0.83 mg/dL (ref 0.4–1.2)
GFR calc Af Amer: >60 mL/min (ref >60)
GFR calc non Af Amer: >60 mL/min (ref >60)
Glucose, Bld: 134 mg/dL — ABNORMAL HIGH (ref 70–99)

## 2011-02-16 LAB — CULTURE, BLOOD (ROUTINE X 2): Culture  Setup Time: 201112010324

## 2011-02-16 LAB — URINALYSIS, ROUTINE W REFLEX MICROSCOPIC
Bilirubin Urine: NEGATIVE
Bilirubin Urine: NEGATIVE
Hgb urine dipstick: NEGATIVE
Nitrite: NEGATIVE
Protein, ur: 30 mg/dL — AB
Specific Gravity, Urine: 1.015 (ref 1.005–1.030)
Urobilinogen, UA: 0.2 mg/dL (ref 0.0–1.0)
pH: 5.5 (ref 5.0–8.0)

## 2011-02-16 LAB — URINE CULTURE
Colony Count: >=100000
Colony Count: >=100000

## 2011-02-16 LAB — POCT CARDIAC MARKERS
Myoglobin, poc: 209 ng/mL (ref 12–200)
Troponin i, poc: 0.23 ng/mL — ABNORMAL HIGH (ref 0.00–0.09)

## 2011-02-16 LAB — URINE MICROSCOPIC-ADD ON

## 2011-02-16 LAB — LACTIC ACID, PLASMA: Lactic Acid, Venous: 2.8 mmol/L — ABNORMAL HIGH (ref 0.5–2.2)

## 2011-02-16 LAB — POCT I-STAT, CHEM 8
Creatinine, Ser: 0.8 mg/dL (ref 0.4–1.2)
Glucose, Bld: 136 mg/dL — ABNORMAL HIGH (ref 70–99)
Hemoglobin: 13.3 g/dL (ref 12.0–15.0)
TCO2: 25 mmol/L (ref 0–100)

## 2011-02-16 LAB — CBC
Hemoglobin: 12.7 g/dL (ref 12.0–15.0)
MCH: 33.1 pg (ref 26.0–34.0)
MCHC: 34.6 g/dL (ref 30.0–36.0)

## 2011-02-16 LAB — PROCALCITONIN: Procalcitonin: 3.7 ng/mL

## 2011-03-04 ENCOUNTER — Other Ambulatory Visit: Payer: Self-pay | Admitting: Internal Medicine

## 2011-03-04 DIAGNOSIS — E876 Hypokalemia: Secondary | ICD-10-CM

## 2011-03-04 DIAGNOSIS — I10 Essential (primary) hypertension: Secondary | ICD-10-CM

## 2011-03-04 NOTE — Telephone Encounter (Signed)
Refill approved; please have patient come in for BMET to recheck her potassium level on this dose.

## 2011-03-10 ENCOUNTER — Other Ambulatory Visit: Payer: Medicare Other

## 2011-03-13 LAB — CBC
Hemoglobin: 12.9 g/dL (ref 12.0–15.0)
MCHC: 34.7 g/dL (ref 30.0–36.0)
RDW: 13.2 % (ref 11.5–15.5)

## 2011-03-13 LAB — BASIC METABOLIC PANEL
CO2: 24 mEq/L (ref 19–32)
Calcium: 9.2 mg/dL (ref 8.4–10.5)
Creatinine, Ser: 0.67 mg/dL (ref 0.4–1.2)
GFR calc Af Amer: >60 mL/min (ref >60)
GFR calc non Af Amer: >60 mL/min (ref >60)
Glucose, Bld: 124 mg/dL — ABNORMAL HIGH (ref 70–99)
Sodium: 133 mEq/L — ABNORMAL LOW (ref 135–145)

## 2011-03-13 LAB — APTT: aPTT: 32 seconds (ref 24–37)

## 2011-03-13 LAB — PROTIME-INR
INR: 1 (ref 0.00–1.49)
Prothrombin Time: 13.6 seconds (ref 11.6–15.2)

## 2011-04-05 ENCOUNTER — Other Ambulatory Visit: Payer: Self-pay | Admitting: Internal Medicine

## 2011-04-05 NOTE — Telephone Encounter (Signed)
Please have patient come in this week for lab work to recheck her potassium level.

## 2011-04-07 ENCOUNTER — Other Ambulatory Visit: Payer: Self-pay | Admitting: Internal Medicine

## 2011-04-07 NOTE — Telephone Encounter (Signed)
I just refilled the potassium chloride on 04/05/2011, and the patient needs to be brought in for a basic metabolic panel this week as I previously requested.  I approved the sertraline refill.

## 2011-04-08 NOTE — Telephone Encounter (Signed)
Talked with pt and she will come in May 8th for labs ( bmet)  Scheduled :)

## 2011-04-13 ENCOUNTER — Other Ambulatory Visit: Payer: Medicare Other

## 2011-04-20 NOTE — Assessment & Plan Note (Signed)
[facility]                             PULMONARY OFFICE NOTE   Jennifer [name], Jennifer [name]                        MRN:          [id number]  DATE:10/11/2007                            DOB:          1941-09-14    Jennifer [name] is a 70 year old female with history of asthmatic bronchitis,  reflux disease, allergic rhinitis.  The patient's level of dyspnea is  the same.  She does have a dry tickling in the throat and some postnasal  drainage. The patient maintains Advair 250/50 one spray b.i.d. and  Spiriva daily.   PHYSICAL EXAMINATION:  Temperature 98, blood pressure 130/68, pulse 100,  saturation 90% on room air.  CHEST:  Showed diminished breath sounds with a few expired wheezes.  CARDIAC:  Exam showed a regular rate and rhythm without S3, normal S1  and S2.  ABDOMEN:  Soft and nontender.  EXTREMITIES:  No edema, clubbing or venous disease.  SKIN:  Clear.  NEUROLOGIC:  Exam intact.   IMPRESSION:  Chronic obstructive lung disease with asthmatic bronchitic  flare.   PLAN:  Plan for this patient is to receive pulsed prednisone 40 mg a day  with a rapid taper.  We will see the patient back in followup.     Charlcie Cradle Delford Field, MD, [facility]  Electronically Signed    PEW/MedQ  DD: 10/11/2007  DT: 10/12/2007  Job #: [id number]   cc:   Ileana Roup, M.D.

## 2011-04-20 NOTE — Assessment & Plan Note (Signed)
[facility]                             PULMONARY OFFICE NOTE   NAME:Jennifer [name], Jennifer [name]                        MRN:          [id number]  DATE:07/27/2007                            DOB:          May 21, 1941    HISTORY OF PRESENT ILLNESS:  The patient is a 70 year old white female  patient of Dr. Lynelle Doctor who has a history of asthmatic bronchitis,  gastroesophageal reflux and allergic rhinitis, presents for a 5 month  followup.  Patient is maintained on Advair 250/50, Spiriva daily and  nocturnal oxygen at 2 liters.  Patient reports since last visit she has  been doing exceptionally well.  She has had no flare in symptoms.  She  denies any chest pain, increased shortness of breath, orthopnea, PND or  leg swelling.   PAST MEDICAL HISTORY:  Reviewed.   CURRENT MEDICATIONS:  Reviewed.   PHYSICAL EXAMINATION:  Patient is a pleasant female in no acute  distress.  She is afebrile with stable vital signs.  O2 saturation is  95% on room air.  HEENT:  Unremarkable.  NECK:  Supple without cervical adenopathy.  No JVD.  LUNGS:  Sounds reveal diminished breath sounds in the bases, otherwise  clear.  CARDIAC:  Regular rate.  ABDOMEN:  Soft and nontender.  EXTREMITIES:  Warm without any edema.   IMPRESSION AND PLAN:  Chronic obstructive pulmonary disease with an  asthmatic bronchitic component complicated by gastroesophageal reflux  and allergic rhinitis.  Patient is to continue on her present regimen  and follow back up with Dr. Delford Field in 3 months or sooner if needed.  Last chest x-ray was in 2006.  Patient will go for a chest x-ray and  follow up accordingly.      Rubye Oaks, NP  Electronically Signed      Charlcie Cradle Delford Field, MD, [facility]  Electronically Signed   TP/MedQ  DD: 07/27/2007  DT: 07/28/2007  Job #: [id number]

## 2011-04-20 NOTE — Assessment & Plan Note (Signed)
 [facility]                             PULMONARY OFFICE NOTE   SYVILLA, MARTIN                        MRN:          [id number]  DATE:11/08/2007                            DOB:          12/28/1940    Ms. Bacchi is a 70 year old white female with a history of asthmatic  bronchitis, reflux disease, allergic rhinitis.  Patient notes a dry  cough, some wheezing, short of breath at times, symptoms somewhat worse  over time.   PHYSICAL EXAMINATION:  Temp 99.5, blood pressure 126/80, pulse 94.  Saturation 91% on room air.  CHEST:  Distant breath sounds with prolonged expiratory phase.  A few  wheezes.  CARDIAC:  Regular rate and rhythm without S3.  Normal S1 and S2.  ABDOMEN:  Soft, nontender.  EXTREMITIES:  No edema, clubbing, or venous disease.  SKIN:  Clear.  NEUROLOGIC:  Intact.   IMPRESSION:  Asthmatic bronchitis with flare.   PLAN:  Patient is to received doxycycline 100 mg b.i.d. for 7 days and a  Depo-Medrol injection of 120 mg IM.  We will see the patient back in  return followup.     Charlcie Cradle Delford Field, MD, [facility]  Electronically Signed    PEW/MedQ  DD: 11/08/2007  DT: 11/08/2007  Job #: [id number]   cc:   Ileana Roup, M.D.

## 2011-04-20 NOTE — Procedures (Signed)
NAME:  Jennifer [name], Jennifer [name]                 ACCOUNT NO.:  [id number]   MEDICAL RECORD NO.:  [id number]          PATIENT TYPE:  OUT   LOCATION:  SLEEP CENTER                 FACILITY:  [facility]   PHYSICIAN:  Clinton D. Maple Hudson, MD, FCCP, FACPDATE OF BIRTH:  05/14/1939   DATE OF STUDY:  04/02/2008                            NOCTURNAL POLYSOMNOGRAM   REFERRING PHYSICIAN:  Ileana Roup, M.D.   REFERRING PHYSICIAN:  Ileana Roup, M.D.   INDICATION FOR STUDY:  Hypersomnia with sleep apnea.   EPWORTH SLEEPINESS SCORE:  10/24, BMI 42.1, weight 230 pounds, height 62  inches, neck 16.5 inches.   MEDICATIONS:  Charted and reviewed.   SLEEP ARCHITECTURE:  Split study protocol.  During the diagnostic phase,  total sleep time was 130 minutes with sleep efficiency 76.1%.  Stage I  with 10%, stage II 83.9%, stage III absent,  REM 6.1% of total sleep  time.  Sleep latency 13.5 minutes, REM latency 56 minutes, awake after  sleep onset 27.5 minutes.  Arousal index 74, indicating increased EEG  arousal.  No bedtime medication was taken.   RESPIRATORY DATA:  Split study protocol.  Apnea hypopnea index (AHI) 154  per hour indicating very severe obstructive sleep apnea/hypopnea  syndrome before CPAP.  Three-hundred and thirty-five events were  counted, including 88 obstructive apneas and 274 hypopneas before CPAP.  Events were not positional.  CPAP titrating was triggered by authorized  emergency protocol due to desaturation.  CPAP was titrated to 17 CWP,  AHI 2.7 per hour.  She chose a medium Res-Med Quattro full-face mask  with heated humidifier.  Supplemental oxygen at 1 liter per minute was  added at 3:25 a.m.   OXYGEN DATA:  Moderate snoring.  Oxygen desaturation to a nadir of 73%.  Mean oxygen saturation after addition of CPAP was 89.3%.  Supplemental  oxygen at 1 liter per minute was added to CPAP at 3:25 a.m.   CARDIAC DATA:  Sinus rhythm with occasional PAC.   MOVEMENT-PARASOMNIA:  No  significant movement disturbance.  Bathroom x1.   IMPRESSIONS-RECOMMENDATIONS:  1. Severe obstructive sleep apnea/hypopnea syndrome, AHI 154 per hour      with non-positional events and moderate snoring.  Oxygen      desaturation to a nadir of 73%.  2. Successful CPAP titration to 17 DWP, AHI 2.7 per hour.  She chose a      ResMed Quattro full-face mask, size medium, with heated humidifier.  3. Sustained oxygen desaturation was noted with mean oxygen saturation      through the study ranging 85-89.3%.  Supplemental oxygen at 1 liter      per minute was added by protocol at 3:25 a.m., in addition to her      CPAP.  Suggest that this be reassessed at home by getting overnight      oximetry while on CPAP and room air for consideration of longterm      home oxygen therapy at least at night.  Underlying cardiopulmonary      disease is likely if sustained hypoxia is confirmed.      Clinton D. Maple Hudson, MD, FCCP, FACP  Diplomate, American  Board of Sleep Medicine  Electronically Signed     CDY/MEDQ  D:  04/06/2008 11:45:15  T:  04/06/2008 12:19:00  Job:  [id number]

## 2011-04-20 NOTE — Discharge Summary (Signed)
NAME:  Jennifer [name], Jennifer [name]                 ACCOUNT NO.:  [id number]   MEDICAL RECORD NO.:  [id number]          PATIENT TYPE:  INP   LOCATION:  6736                         FACILITY:  [facility]   PHYSICIAN:  C. Ulyess Mort, M.D.DATE OF BIRTH:  [birth year]   DATE OF ADMISSION:  05/10/2007  DATE OF DISCHARGE:  05/11/2007                               DISCHARGE SUMMARY   CHIEF COMPLAINT:  Cough and shortness of breath.   PRIMARY DISCHARGE DIAGNOSES:  1. Trilobar pneumonia.  2. Hypokalemia.  3. Hypophosphatemia.  4. Anemia, likely secondary to iron deficiency and anemia of chronic      disease.  5. Chronic obstructive pulmonary disease, oxygen dependant.  6. History of bronchiolitis obliterans with organizing pneumonia.  7. Brain aneurysm with stent.  8. Status post partial hysterectomy.  9. Fibromyalgia.  10.Hypertension.  11.Hyperlipidemia.  12.Meniere's disease.  13.Depression.  14.Osteoarthritis.  15.Peptic ulcer disease.  16.Melanoma, status post resection.   DISCHARGE MEDICATIONS:  1. Avelox 400 mg 1 p.o. for 7 days.  2. Prednisone 10 mg 4 tablets for 2 days, 3 tablets for 2 days, 2      tablets for 2 days, 1 tablet for 2 days (last on May 19, 2007).  3. Fluticasone spray, 2 spray per nostril daily.  4. Proair or albuterol 2 puffs 4 times a day as needed.  5. Lidoderm 5% patch, 1 patch twice a day.  6. Aspirin 81 mg daily.  7. Ibuprofen 200 mg, 2 tabs twice a day.  8. HCTZ 12.5 mg once a day.  9. Antivert 12.5 mg 2 times a day as needed.  10.Red yeast rice extract 600 mg twice a day.  11.Mucinex 600 mg twice a day.  12.Vicodin-EF 7.5/750 mg 4 times a day as needed.  13.Spiriva 18 mcg, 1 capsule inhaled daily.  14.Advair 250/50 mg, 1 puff twice each day.  15.Soma 350 mg 4 times a day as needed.  16.Reglan 10 mg 3 times a day as needed.  17.Zoloft 100 mg twice a day.  18.Xanax 0.5 mg, 1 to 1.5 tablets 3 times per day as needed.  19.Protonix 40 mg daily.  20.Procardia-XL 90  mg daily.  21.Klor-Con 10 mg daily.  22.Amitriptyline 150 mg daily.   DISPOSITION AND FOLLOWUP:  Dr.  Meredith Pel, May 17, 2007, at 11:15 a.m.  1. Follow up on anemia.  Iron studies are equivocal for iron      deficiency versus anemia of chronic disease.  Denies ever having      had a colonoscopy, and also denies any symptoms of a GI bleed or      malignancy.  2. Follow up on dyslipidemia.   PROCEDURES PERFORMED:  None.   ADMITTING H&P:  HPI:  The patient presented as a 70 year old female with  a history of oxygen-dependent COPD and BOOP, and a 2-week history of  feeling weak and tired.  She had nonproductive cough for one week, and  was seen in clinic one day prior to admission.  At that time, Dr.  Janee Morn told her that she needed to be admitted, which she refused at  that time for personal reasons.  Subsequently, she received a chest x-  ray that revealed Trilobar pneumonia, and was called and prompted to  come in to the hospital.  She denied fevers, chills, diarrhea,  constipation, nausea, or vomiting.   PHYSICAL EXAMINATION:  VITAL SIGNS:  Temperature 98.6, pulse 86, blood  pressure 100/64, respiratory rate 22, O2 sats 94% on 2 liters.  GENERAL:  No acute distress, coughing, short of breath but speaking in  full sentences.  ENT:  Clear.  RESPIRATORY:  Wheezing and rhonchi scattered over all lung disease.  CV:  Regular rate and rhythm.  No murmurs, rubs or gallops.  ABDOMEN:  Soft, nontender, nondistended.  Positive bowel sounds.  EXTREMITIES:  No edema.  SKIN:  Dry, pale.   LABORATORY DATA:  CBC:  WBC 12.7, hemoglobin 9.1, hematocrit 29,  platelet count 412, MCV 86, RBW 15.7, ANC 9.6.  Sodium 135, potassium  2.3, chloride 91, bicarb 34, BUN 9, creatinine 0.84, glucose 146,  albumin 2.3.   HOSPITAL COURSE:  1. Trilobar pneumonia:  The patient was admitted and placed on IV      vancomycin 1000 mg b.i.d. and Zosyn 3.375 grams q.6 h. to cover her      community acquired  pneumonia.  With regard to her underlying COPD,      she was placed on Solu-Medrol, albuterol and Atrovent.  She was      placed on O2 titrated to SPOT greater than 92%.  She was treated      with Mucinex as well.  Her CBC was monitored, and sputum and blood      were cultured.  On the day after admission, she told us that she      felt considerably better and that she wanted to go home.  She had      been afebrile since admission, and the team switched her to oral      medications before discharge.   1. Hypokalemia:  At admission, the patient had a K of 2.3.  She was      given IV potassium, and her levels improved to 3.5.   1. Hypophosphatemia:  At admission, the patient's phosphate was less      than 1.0.  Her phosphate was replaced, and her levels improved to      3.1.   1. Anemia:  The patient came in reporting chronic anemia.  An anemia      panel was drawn and showed iron 16, TIBC 271, percent sat 6, with a      normal B12 and folate.  Ferritin was 80.  The patient denied any      personal or family history of GI cancer, and denied GI bleeds or      melena.  She also denied any vaginal bleeding.  She denied ever      having had a colonoscopy.  This is likely an anemia of chronic      disease, but may have an element of iron deficiency.  She was      referred to her PCP for further evaluation of anemia.   1. Fibromyalgia:  The patient was kept on her pain regimen from home.   1. Hypertension:  On admission, the patient's blood pressure was      100/64, so her home regimen of Procardia-XL 90 mg and HCTZ 12.5 was      held until the day after admission.   DISCHARGE LABS AND VITALS:  T-max 99.1,  SBP 100-152, DBP 54-83, P 64-93,  respiratory rate 20, SPO2 93% on 2 liters.   CBC:  WBC 9.3, hemoglobin 9.1, hematocrit 27.9, platelets 259.  BMET:  Sodium 145, K 4.1, chloride 101, bicarb 36, BUN 6, creatinine  0.6, glucose 109, T04 of 3.1, MG 2.1.     C. Ulyess Mort, M.D.   Electronically Signed     CSR/MEDQ  D:  05/11/2007  T:  05/11/2007  Job:  [id number]

## 2011-04-21 ENCOUNTER — Other Ambulatory Visit: Payer: Medicare Other

## 2011-04-21 DIAGNOSIS — R7309 Other abnormal glucose: Secondary | ICD-10-CM

## 2011-04-21 DIAGNOSIS — E876 Hypokalemia: Secondary | ICD-10-CM

## 2011-04-21 LAB — BASIC METABOLIC PANEL
BUN: 6 mg/dL (ref 6–23)
Calcium: 9.8 mg/dL (ref 8.4–10.5)
Chloride: 94 mEq/L — ABNORMAL LOW (ref 96–112)
Creat: 0.6 mg/dL (ref 0.40–1.20)

## 2011-04-22 NOTE — Progress Notes (Signed)
Addended by: Margarito Liner on: 04/22/2011 10:28 AM   Modules accepted: Orders

## 2011-04-23 NOTE — Assessment & Plan Note (Signed)
[facility]                               PULMONARY OFFICE NOTE   Jennifer [name], Jennifer [name]                        MRN:          [id number]  DATE:08/03/2006                            DOB:          [birth year]    Jennifer [name] is a 70 year old white female with a history of asthmatic  bronchitis, reflux disease, allergic rhinitis, depression.  Patient notes  continued shortness of breath for 2 weeks with the heat and humidity.  She  has minimal cough, increased wheezing is noted.  She is maintained on  Nasonex 2 sprays each nostril daily, Spiriva daily, Advair 100/50 one spray  b.i.d., oxygen 2L continuous.   EXAM:  Temp 98, blood pressure 120/76, pulse 71, saturation 92% on room air.  CHEST:  Showed inspiratory and expiratory wheeze with poor air flow.  CARDIAC EXAM:  Showed a regular rate and rhythm without S3, normal S1, S2.  ABDOMEN:  Soft, nontender.  EXTREMITIES:  Showed no edema or clubbing, skin was clear.  NEUROLOGIC EXAM:  Intact.  HEENT EXAM:  Showed no jugular venous distention, no lymphadenopathy,  oropharynx and pharynx supple.   IMPRESSION:  Increased airway inflammation with chronic obstructive lung  disease flare.   PLAN FOR THIS PATIENT:  Repulse prednisone at 40 mg a day, tapered down by  10 mg over 3 days until off.  She is also going to increase the Advair to  250/50 one spray b.i.d. and return to this office in 2 months.                                   Charlcie Cradle Delford Field, MD, [facility]   PEW/MedQ  DD:  08/03/2006  DT:  08/04/2006  Job #:  [id number]   cc:   Ileana Roup, MD

## 2011-04-23 NOTE — Assessment & Plan Note (Signed)
[facility]                               PULMONARY OFFICE NOTE   Jennifer [name]                        MRN:          [id number]  DATE:09/05/2006                            DOB:          01/24/1941    Jennifer [name] returns today in follow-up.  A 70 year old white female with a  history of asthmatic bronchitis, chronic lung disease, maintained on Spiriva  1 capsule daily, Advair 250/50 one spray twice daily, oxygen 2 L continuous,  also maintains Prilosec 40 mg daily, Zyrtec 10 mg daily, Nasonex 2 sprays  each nostril daily.  Other maintenance medicines listed in chart, correct as  reviewed.   PHYSICAL EXAMINATION:  VITAL SIGNS:  Temperature 98, blood pressure 114/80,  pulse 95, oxygen saturation 90% on room air.  CHEST:  Showed diminished breath sounds with prolonged expiratory phase, no  wheeze or rhonchi noted.  HEART:  Exam showed a regular rate and rhythm without S3, normal S1, S2.  ABDOMEN:  Soft, nontender.  EXTREMITIES:  Showed no edema or clubbing.  SKIN:  Clear.  NEUROLOGIC:  Exam was intact.  HEENT:  Exam also did reveal a right-sided lower eyelid inflammatory lesion  with edema in the subcutaneous tissue below the eye.   IMPRESSION:  Chronic obstructive lung disease stable at this time.   PLAN:  For the patient to maintain inhaled medicines as currently dosed.  Refills are not necessary.  Flu vaccine was administered.  Relative to the  eye involvement, she was referred to [facility]. [name] for  evaluation.  Will see the patient back in follow-up in three months.       Jennifer Cradle Delford Field, MD, [facility]      PEW/MedQ  DD:  09/05/2006  DT:  09/06/2006  Job #:  [id number]   cc:   Ileana Roup, M.D.

## 2011-04-23 NOTE — Discharge Summary (Signed)
Jennifer [name], SMICK                 ACCOUNT NO.:  [id number]   MEDICAL RECORD NO.:  [id number]          PATIENT TYPE:  INP   LOCATION:  5709                         FACILITY:  [facility]   PHYSICIAN:  Donald Pore, MD       DATE OF BIRTH:  22-Aug-1941   DATE OF ADMISSION:  12/01/2004  DATE OF DISCHARGE:  12/15/2004                                 DISCHARGE SUMMARY   Of note, the patient was discharged from the hospital to rehab.  For more  detailed discharge summary, please note the dictation by Dr. Ellwood Dense  on day of discharge December 24, 2004.   DISCHARGE DIAGNOSES:  1.  Bronchiolitis obliterans-organizing pneumonia.  2.  History of fibromyalgia.  3.  History of hyperlipidemia.  4.  History of hypertension.  5.  History of depression.  6.  Meniere's disease.  7.  History of carotid aneurysm.  8.  Status post total abdominal hysterectomy.  9.  History of diverticulitis.   HISTORY OF PRESENT ILLNESS:  A 70 year old female who presented to the  hospital with a history of fatigue and chills and productive cough with  green and yellow sputum and shortness of breath x 2-3 weeks.  Chest x-ray  positive for bilateral interspace disease.  The patient was admitted to the  hospital, was initially treated with Ceftin and Zithromax for probable  pneumonia.  She was provided with albuterol and Atrovent nebulizer  treatment, respiratory __________declined and eventually the patient was  placed on BiPAP and IV steroids.  Steroids were tapered and changes were  made in [name] antibiotic regimen.  [name] had a protracted  hospital course and suffered decondition as a result of her prolonged stay  and therefore she was transferred to rehab in the subacute care unit in  order to improve her mobility prior to discharge.  For a more detailed  summary please review the discharge summary dictated by Dr. Ellwood Dense  dated December 24, 2004.      HP/MEDQ  D:  03/11/2005  T:   03/11/2005  Job:  [id number]

## 2011-04-23 NOTE — Discharge Summary (Signed)
Jennifer [name], Jennifer [name]                 ACCOUNT NO.:  [id number]   MEDICAL RECORD NO.:  [id number]          PATIENT TYPE:  INP   LOCATION:  3704                         FACILITY:  [facility]   PHYSICIAN:  Jennifer [name], M.D.      DATE OF BIRTH:  1941/07/27   DATE OF ADMISSION:  01/08/2005  DATE OF DISCHARGE:  01/15/2005                                 DISCHARGE SUMMARY   DISCHARGE DIAGNOSES:  1.  Respiratory failure secondary to Xanax overdose status post intubation.  2.  Coagulase negative staphylococcal bacteremia with transesophageal      echocardiography negative for vegetation.  3.  Hypokalemia most likely secondary to overdose of hydrochlorothiazide.  4.  Community acquired aspiration pneumonia secondary to respiratory      failure, secondary to Xanax overdose status post intubation.   PAST MEDICAL HISTORY:  1.  Pneumonia (BOOP).  2.  Brain aneurysm with stent and coiling.  3.  Hysterectomy.  4.  Lumpectomy for fibrocystic breast disease.  5.  History of diverticulosis.  6.  History of fibromyalgia.  7.  Hypertension.  8.  Hyperlipidemia.  9.  Anemia.  10. Depression.   DISCHARGE MEDICATIONS:  1.  Vancomycin 1 gram intravenously q.d. for total of 4 weeks (last day      being February 08, 2005).  2.  Augmentin 875 mg p.o. b.i.d. for 10 more days for a total of 14 days.  3.  Xanax 0.5 mg p.o. t.i.d.  4.  Elavil 150 mg p.o. q.d.  5.  Soma 350 mg p.o. t.i.d.  6.  Prevacid 30 mg p.o. q.d.  7.  Reglan 10 mg t.i.d.  8.  Vicodin 7.5/750 1 pill q.i.d. p.r.n.  9.  Lipitor 10 mg p.o. q.d.  10. Procardia XL 30 mg p.o. q.d. (no dose has been reduced from 90 mg p.o.      q.d. as an outpatient).   FOLLOW UP:  The patient will followup with Dr. Margarito [name] on January 27, 2005 at 9:30 a.m. Please followup on the following issues:  1.  Blood cultures will be obtained since the patient has coagulase negative      bacteremia.  2.  B-met will be obtained, since the patient had hypokalemia  during      hospital admission.  3.  A chest x-ray can be obtained as indicated per clinical examination to      look for resolution of aspiration pneumonia.  4.  Blood pressure needs to be monitored since the dose of Procardia was      decreased to 30 mg p.o. q.d. and it can be titrated as an outpatient.  5.  The patient had mild to moderate mitral regurgitation on transesophageal      echocardiography done on January 15, 2005 and further management can be      planned at this followup.   PROCEDURE:  1.  CT of the head without contrast performed on January 08, 2005 showed no      acute intracranial abnormalities and she was status post coiling of      right  internal carotid artery aneurysm.  2.  Transesophageal echocardiography performed on January 15, 2005 by Dr.      Shari [name] did not show any obvious vegetations. Normal left      ventricular function. Normal left atrium, right atrium and right      ventricle. Also an oscillating density in right atrium, most likely      secondary to a venous catheter. Also showed mild atherosclerotic      descending aorta. It also showed mild tricuspid regurgitation, mildly      thickened mitral valve, mild to moderate mitral regurgitation, and no AS      with trace aortic regurgitation.   HISTORY OF PRESENT ILLNESS:  Jennifer [name] is a 70 year old white female with a  past medical history significant for pneumonia (BOOP) that was recently  discharged on December 24, 2004 after being treated for pneumonia and BOOP.  She was found unresponsive on the couch on the day of admission in  respiratory distress. The cause is thought to be secondary to taking 12  pills of Xanax 0.5 mg accidentally.   PHYSICAL EXAMINATION:  VITAL SIGNS:  Pulse 114, blood pressure 126/85,  temperature 101.4, respiratory rate 36. O2 saturation is 100% FIO2.  GENERAL:  The patient was intubated.  NECK:  Supple.  LUNGS:  The patient had diffuse rhonchi, right greater than  left.  CARDIOVASCULAR:  Regular rate and rhythm. S1 and S2 were normal.  GASTROINTESTINAL:  Abdomen soft, obese, nontender. Positive bowel sounds.  EXTREMITIES:  1+ edema around the ankles, 2+ palpable peripheral pulses.  NEUROLOGIC:  The patient was sedated since was on the vent but she was  spontaneously moving all of her extremities.   ADMISSION LABORATORY DATA:  On admission sodium 137, potassium 2.3, chloride  99, bicarb 33, BUN 7, creatinine 0.7, glucose 169. Hemoglobin 12.2, white  count 18.1, with ANC 15.2, platelets 536,000. Salicylate less than 4,  acetaminophen less than 10. UA was negative. Urine drug screen positive for  benzodiazepines and positive for opiates. Brain natriuretic peptide was  43.2. INR was 1, PT 13.4, PTT 32. First set of cardiac enzymes were  negative.   HOSPITAL COURSE:  1.  Ventilatory dependent respiratory failure secondary to respiratory      depression from Xanax overdose. The patient was intubated while she was      in the emergency room. Also since the patient was doing well on the day      of admission, she was extubated that evening. She continued to do well      during the rest of the hospitalization stay and her oxygen saturations      were well controlled on 2 liters of oxygen. The patient uses 2 liters of      oxygen at home and will be discharged home on the same regimen. Also      note that the patient took Xanax by mistake. It was not intentional      overdose. Thus, a psychiatric evaluation was not considered necessary.  2.  Coagulase negative bacteremia. Also, since patient had a temperature of      101 following hospital admission, blood cultures were obtained. They      subsequently were positive for coagulase negative staph that was      sensitive to Vancomycin. Thus, patient is started on Vancomycin. The      original plan was to continue Vancomycin for 14 days. However, a set of     blood cultures were  obtained 2 days after starting  Vancomycin and      continued to be positive for coagulase negative staphylococcus. Thus,      there was a concern for vegetations and a transesophageal      echocardiography was performed on day of discharge by Dr. Orlin Hilding. The      transesophageal echocardiography was negative for any vegetations. Thus,      patient is being discharged on Vancomycin for a total of 4 weeks. A set      of blood cultures will be obtained after 2 weeks of being on Vancomycin      at the hospital followup visit on January 27, 2005. Clinically, the      patient was stable. Did not have any temperature spikes or elevations in      white count on day of discharge.  3.  Aspiration pneumonia. A chest x-ray obtained on day of admission showed      some heavy markings in the right lung base. Subsequent chest x-ray done      after patient was hydrated showed bibasilar infiltrates. This was      concerning for a community acquired aspiration, which was tho9ught to be      secondary to the respiratory depression from drug overdose. Thus,      patient was started on Augmentin, which would cover anaerobes, that are      a common cause of community acquired aspiration. She will receive      Augmentin for a total of 14 days. A followup chest x-ray can be obtained      after the course of Augmentin is finished to ensure resolution of the      infiltrates.  4.  Hypokalemia. The patient's potassium was 2.3 on hospital admission. This      was thought to be most likely secondary to multiple pills of      hydrochlorothiazide that the patient had accidentally taken. Magnesium      level was obtained and was 1.7. The magnesium along with potassium was      repleted with the discharge potassium of 4. A B-met will be obtained at      hospital followup visit to ensure that the patient is not hypokalemic.  5.  Hypertension. The patient has a history of hypertension and is on      Procardia. The patient was not started on blood  pressure medicines until      day 4 of hospital admission. She was started on low doses of her blood      pressure medicine, namely Procardia 30 mg p.o. q.d. and blood pressure      was stable. Thus, her medication was not increased as she is being      discharged home on Procardia XL 30 mg p.o. q.d. This can be further      titrated as an outpatient as needed.  6.  All other medical conditions were stable during hospitalization stay and      were managed on home medicines.   DISCHARGE LABORATORY DATA:  Sodium 141, potassium 4, chloride 104, bicarb  32, glucose 112, BUN 5, creatinine 0.6, hemoglobin is 9.8, platelets  483,000, white count 9.9.      BP/MEDQ  D:  01/15/2005  T:  01/16/2005  Job:  [id number]   cc:   Madaline Guthrie, M.D.  [address]  [place] [address]  Fax: [phone]   Ileana Roup, M.D.  [address], [place] [address]  Fax: [phone]

## 2011-04-23 NOTE — Discharge Summary (Signed)
Jennifer Jennifer [name], Jennifer Jennifer [name]                 ACCOUNT NO.:  [id number]   MEDICAL RECORD NO.:  [id number]          Jennifer [name] TYPE:  ORB   LOCATION:  4505                         FACILITY:  [facility]   PHYSICIAN:  Ellwood Dense, M.D.   DATE OF BIRTH:  11-29-1941   DATE OF ADMISSION:  12/15/2004  DATE OF DISCHARGE:  12/24/2004                                 DISCHARGE SUMMARY   DISCHARGE DIAGNOSES:  1.  Deconditioned after pneumonia and bronchiolitis obliterans organizing      pneumonia.  2.  History of fibromyalgia.  3.  History of dyslipidemia.  4.  History of hypertension.  5.  History of depression.  6.  Anemia.  7.  History of diverticulitis.   PAST MEDICAL HISTORY:  1.  Brain aneurysm with stent and coiling.  2.  Hysterectomy.  3.  History of lumpectomy for fibrocystic breast disease.   HISTORY AND PHYSICAL:  Jennifer Jennifer [name] is a 70 year old white female with a  history of hypertension, fibromyalgia, and right ICA aneurysm, status post  obliteration on November 10, 2004, who presents on 12/01/2004 with two or  three weeks of productive cough, fevers secondary to pneumonia.  Started on  IV Rocephin and Zithromax, with very little improvement.  Respiratory state  worsened secondary to BOOP.  Therefore, placed on BIPAP and steroids.   HOSPITAL COURSE:  Admitted for elevated FSTs, hypotension, ruptured Baker's  cyst.  Jennifer [name] followed by ID.  Still taking Avelox and prednisone, and then  placed on a prednisone taper, and is on chronic oxygen.  PT report at this  time that Jennifer Jennifer [name] can ambulate 80 feet with minimal assist with a  rolling walker.  Transfers with close supervision.  Bed mobility not tested.  Jennifer Jennifer [name] was transferred to [facility] subacute department on 12/11/2004.   REVIEW OF SYSTEMS:  Significant for chest pain, shortness of breath, cough,  chills, weakness, depression.   PAST MEDICAL HISTORY:  1.  Hypertension.  2.  Fibromyalgia.  3.  Meniere's disease.  4.  OA.  5.   Hyperlipidemia.  6.  Question right carotid aneurysm.   PAST SURGICAL HISTORY:  1.  Stent and coiling of Jennifer aneurysm.  2.  Hysterectomy.  3.  Appendectomy.   FAMILY HISTORY:  Noncontributory.   SOCIAL HISTORY:  Jennifer Jennifer [name] lives with husband and daughter in one-level  home.  Two steps.  No tobacco x 20 years.  History of heavy alcohol abuse in  Jennifer past.   MEDICATIONS PRIOR TO ADMISSION:  1.  Xanax 0.5 mg t.i.d.  2.  Soma.  3.  Elavil.  4.  Zoloft.  5.  Zyrtec.  6.  Reglan.  7.  Prevacid.  8.  Aspirin.  9.  Potassium.  10. Flonase.   HOSPITAL COURSE:  Jennifer Jennifer [name] was admitted to [facility] subacute  department on December 15, 2004, where she received an hour of therapies  daily.  Overall, Jennifer Jennifer [name] ___________ for a modified independent level,  able to ambulate greater than 120 feet without assistive device, with  oxygen.  She was able to do bed  transfers modified independent as well as  bed mobility modified independent.  Jennifer Jennifer [name] progressed very well from a  physical therapy standpoint.  Jennifer Jennifer [name]'s daughter remained with Jennifer  Jennifer [name] throughout most of Jennifer stay in Jennifer hospital.  From a pulmonary  standpoint, Jennifer Jennifer [name] had no significant problems.  She was tapered off  Jennifer prednisone.  No significant cough or fevers while on rehab.  She  completed a 14-day course of Avelox.  Jennifer Jennifer [name] was on 4 liters of oxygen  to keep saturations up greater than 90%.  Therefore, Jennifer Jennifer [name] was placed  on home oxygen, to follow up with primary care Solana Coggin concerning this.  Jennifer latest chest x-ray on December 22, 2004 revealed nonspecific residual  patchy primarily right greater than left opacities, otherwise no active  disease.  Sine last chest CT November 17, 2004, intermittent improvement,  with stable findings since [facility] portable chest on December 12, 2004.  Jennifer Jennifer [name] remained on Vicodin for a history of fibromyalgia.  At Jennifer time  of discharge, Jennifer Jennifer [name]'s  white blood cell count had normalized.  At Jennifer  time of admission, she had hemoglobin 11.4, hematocrit 34.8.  She did take a  multivitamin daily.  Recommended Jennifer [name] to take multivitamin with iron at  time of discharge.  Blood pressure was under good control, with  hydrochlorothiazide as well as Procardia.  No adjustment was necessary to  these blood pressure medications.  At Jennifer beginning of admission, Jennifer  Jennifer [name] was on Atrovent as well as and Xopenex nebulizers.  These were  discontinued prior to admission without any complications.  Overall, Jennifer  Jennifer [name] has done well while in rehab.   LATEST LABORATORIES:  Hemoglobin 11.4, hematocrit 34.8, platelet count 543.  Two Hemoccults were drawn for blood, they were negative.  Latest sodium 140,  potassium 4.3, chloride 98, CO2 34, glucose 122, BUN 14, creatinine 0.7, AST  19, ALT 36, alkaline phosphatase 100.  Urine culture performed on December 15, 2004 demonstrated one insignificant growth, 5,000 colonies.   PT report at Jennifer time of discharge indicated Jennifer Jennifer [name] was able to perform  bed mobility at modified independent level, transfers at a modified  independent level, ambulating at approximately a modified independent level  with a rolling walker with oxygen.  Performed most ADLs at a supervision or  modified independent level.  Jennifer Jennifer [name] met all goals.  Discharged home  with her family.   DISCHARGE MEDICATIONS:  1.  Folic acid 1 mg daily.  2.  Zoloft 100 mg daily.  3.  HCTZ 25 mg daily.  4.  Thiamine 100 mg daily.  5.  Procardia 90 mg daily.  6.  Xanax t.i.d.  7.  Elavil at bedtime.  8.  Reglan t.i.d.  9.  Zyrtec 10 mg daily.  10. Vicodin 1-2 tablets q.4-6 h. as needed.  11. K-Dur, resume her potassium at 8 mEq daily.  12. Multivitamin with iron daily.   Pain management with Vicodin.  Use oxygen.  Use walker.  No driving.  No  alcohol.  No smoking.  Home health PT and OT.  Dr. Jimmye Norman in three weeks.  Follow up with  primary care doctor in three weeks.  Follow up with  Dr. Ellwood Dense as needed.       LB/MEDQ  D:  12/24/2004  T:  12/24/2004  Job:  [id number]   cc:   Jimmye Norman, M.D.   Ellwood Dense, M.D.  [address]  [place]  [place] [address]  Fax: [phone]

## 2011-04-23 NOTE — Assessment & Plan Note (Signed)
[facility]                             PULMONARY OFFICE NOTE   Jennifer [name], Jennifer [name]                        MRN:          [id number]  DATE:02/06/2007                            DOB:          08-23-1941    Jennifer [name] is a 70 year old white female with history of asthmatic  bronchitis, reflux disease, allergic rhinitis.  Notes wheezing for 2  weeks, increase dyspnea with activity, breathing in a rapid pattern.   1. She is only using her Advair, though, once a day at 1 spray daily,      250/50 strength.  2. She is on Spiriva once daily.  3. Procardia 90 mg XL daily.  4. Prilosec 40 mg daily.  5. Zoloft 100 mg b.i.d.  6. Ecotrin 325 mg daily.  7 . Reglan 10 mg 3 times a day.  1. Elavil 150 mg h.s.   PHYSICAL EXAMINATION:  VITAL SIGNS:  Temperature 98, blood pressure  124/74, pulse 90, saturation 93% on room air.  CHEST:  Showed distant breath sounds with prolonged expiratory phase, a  few expired wheezes.  CARDIAC: Regular rate and rhythm without S3.  Normal S1 and S2.  ABDOMEN:  Soft, nontender.  EXTREMITIES: Showed no edema or clubbing.  SKIN:  Clear.   IMPRESSION:  Asthmatic bronchitis with reflux disease and flare.   PLAN:  The patient is to increase Advair back to 250/50 one spray b.i.d.  Repulse prednisone at 40 mg a day, taper down to 10 mg every 4 days  until off.  We will see the patient back in return for followup.     Charlcie Cradle Delford Field, MD, [facility]  Electronically Signed    PEW/MedQ  DD: 02/06/2007  DT: 02/06/2007  Job #: [id number]   cc:   Ileana Roup, M.D.

## 2011-04-23 NOTE — H&P (Signed)
NAME:  Jennifer [name], Jennifer [name]                 ACCOUNT NO.:  [id number]   MEDICAL RECORD NO.:  [id number]          PATIENT TYPE:  OIB   LOCATION:  2861                         FACILITY:  [facility]   PHYSICIAN:  Sanjeev K. Deveshwar, M.D.DATE OF BIRTH:  August 07, 1941   DATE OF ADMISSION:  03/19/2005  DATE OF DISCHARGE:                                HISTORY & PHYSICAL   CHIEF COMPLAINT:  The patient is here today for cerebral angiogram.   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  The patient had been doing well until several  weeks ago when she developed recurrent symptoms of vertigo.  She also has  had intermittent headaches, however she has had the headaches for quite some  time.  The patient tells me that she initially presented with hearing loss  which led to an MRI, which led to the eventual interventions as mentioned  above.  She is followed by Dr. Alita Chyle for Meniere's disease and her  hearing loss.   PAST MEDICAL HISTORY:  1.  Hyperlipidemia.  2.  Hypertension.  3.  COPD.  She is on continuous oxygen at home 2 liters per minute.  4.  She is status post cerebrovascular stenting in August 2005.  5.  She is status post right internal carotid artery aneurysm coiling in      December 2005.  6.  She has a history of anemia, she has had a remote GI bleed.  7.  She has osteoarthritis.  8.  Fibromyalgia.  9.  History of anxiety and depression.  10. Meniere's disease.  11. Diverticular disease.  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   PAST SURGICAL HISTORY:  1.  Tonsillectomy.  2.  Total abdominal hysterectomy.  3.  Breast surgery.  4.   Surgery for a fractured finger.   ALLERGIES:  1.  TETRACYCLINE.  2.  SHE HAS ASPIRIN INTOLERANCE DUE TO STOMACH UPSET AND PREVIOUS GI BLEED,      ALTHOUGH SHE IS TOLERATING ENTERIC-COATED ASPIRIN AT THIS TIME.   CURRENT MEDICATIONS:  1.  Xanax 0.5 mg t.i.d.  2.  Vicodin 7.5 mg q.i.d.  3.  Soma 350 mg t.i.d.  4.  Elavil 150 mg at bedtime.  5.  Zoloft 50 mg daily.  6.  Reglan 10 mg t.i.d.  7.  Protonix 40 mg daily.  8.  Procardia XL 30 mg daily.  9.  Potassium 16 mEq daily.  10. Zyrtec 10 mEq daily.  11. Ecotrin 325 mg daily.  12. Ibuprofen 400-800 mg p.r.n.  13. Herbal medications.   SOCIAL HISTORY:  The patient is married, she lives in [place] with her  husband and daughter.  She has two daughters and one son.  She quit smoking  20 years ago.  She previously smoked two packs per day for 30 years.  She  has a remote history of alcohol abuse.  She is currently disabled.   FAMILY HISTORY:  Her mother died in her 4s from a MI, her father died in  his 44s he had lung disease and questionably TB.   REVIEW OF SYSTEMS:  Negative except for respiratory infection approximately  two weeks ago associated with fever and nausea.  She has had intermittent  headaches as well as intermittent dizziness and blurred vision.  She has had  some weight gain.  She has frequent cough which is nonproductive, wheezing,  shortness of breath, history of anxiety and depression, osteoarthritis.  She  tells me she has intermittent bilateral extremity weakness and numbness,  intermittent problems with her gait and coordination.   LABORATORY DATA:  Pending.   PHYSICAL EXAMINATION:  Reveals a pleasant 70 year old white female in no  acute distress.  VITAL SIGNS:  Currently pending.  HEENT:  Unremarkable.  NECK:  No bruits, no jugular venous distention.  HEART:  Regular rate and rhythm with distant heart sounds.  LUNGS:  Decreased, but clear.  ABDOMEN:  Obese, soft, nontender.  EXTREMITIES:  Pulses  intact without any significant edema.  SKIN:  Warm and dry.  NEUROLOGIC:  Mental status -- the patient is alert and oriented, follows  commands and responds appropriately.  Cranial nerves are grossly intact  except for decreased auditory acuity in the right ear.  Sensation is intact  to light touch.  Motor strength is 5/5 throughout.  Reflexes are normal to  brisk.  Cerebellar testing is intact.   IMPRESSION:  1.  The patient for cerebral angiogram today.  2.  History of right internal carotid artery aneurysm coiling performed in      December 2005.  3.  Cerebrovascular stent in August 2005.  4.  Recent symptoms of vertigo and headaches.  5.  Chronic obstructive pulmonary disease on continuous oxygen at home, with      previous respiratory failure.  6.  Hypertension.  7.  Hyperlipidemia.  8.  History of anemia.  9.  Remote history of peptic ulcer disease.  10. Osteoarthritis.  11. Fibromyalgia.  12. Anxiety and depression.  13. History of Meniere's disease.  14. History of diverticular disease.  15. Status post multiple surgeries.  16. Remote tobacco history.  17. Multiple surgeries as noted above.   PLAN:  As noted, the patient will undergo cerebral angiogram today to follow  up on previous cerebrovascular disease.   ADDENDUM:  Please note vital signs -- blood pressure 114/81, pulse 92,  respirations 20, temperature 97.9, oxygen saturation 90% on room air.      DR/MEDQ  D:  03/19/2005  T:  03/19/2005  Job:  [id number]   cc:   Ileana Roup, M.D.  [address] [place] [address]  Fax: [phone]   Alita Chyle, MD

## 2011-05-04 ENCOUNTER — Other Ambulatory Visit: Payer: Self-pay | Admitting: Internal Medicine

## 2011-05-04 ENCOUNTER — Other Ambulatory Visit: Payer: Medicare Other

## 2011-05-05 NOTE — Telephone Encounter (Signed)
Refills improved-nurse to call in Soma and Vicodin ES.

## 2011-05-05 NOTE — Telephone Encounter (Signed)
Called soma and hydrocodone to pharm

## 2011-05-11 ENCOUNTER — Other Ambulatory Visit: Payer: Medicare Other

## 2011-05-17 ENCOUNTER — Encounter: Payer: Self-pay | Admitting: Critical Care Medicine

## 2011-05-21 ENCOUNTER — Ambulatory Visit: Payer: Medicare Other | Admitting: Critical Care Medicine

## 2011-05-28 ENCOUNTER — Encounter: Payer: Self-pay | Admitting: Critical Care Medicine

## 2011-05-31 ENCOUNTER — Ambulatory Visit (INDEPENDENT_AMBULATORY_CARE_PROVIDER_SITE_OTHER): Payer: Medicare Other | Admitting: Critical Care Medicine

## 2011-05-31 ENCOUNTER — Encounter: Payer: Self-pay | Admitting: Critical Care Medicine

## 2011-05-31 VITALS — BP 122/80 | HR 87 | Temp 98.6°F | Ht 63.0 in | Wt 238.0 lb

## 2011-05-31 DIAGNOSIS — J449 Chronic obstructive pulmonary disease, unspecified: Secondary | ICD-10-CM

## 2011-05-31 MED ORDER — ALBUTEROL SULFATE HFA 108 (90 BASE) MCG/ACT IN AERS: 2.0000 | INHALATION_SPRAY | Freq: Four times a day (QID) | RESPIRATORY_TRACT | Status: DC | PRN

## 2011-05-31 NOTE — Patient Instructions (Signed)
No change in medications. Return in        6 months        

## 2011-05-31 NOTE — Progress Notes (Signed)
Subjective:    Patient ID: Jennifer [name], female    DOB: 05/01/1941, 70 y.o.   MRN: [id number]  HPI History of Present Illness:  70 y.o.    last smoked in 2009 with chronic obstructive lung disease, asthmatic bronchitic component.   November 23, 2010 ov discharge 10 days prior to ov now on ACE with worse coughing dry and hacky and worse sob. Pt denies any significant sore throat, dysphagia, itching, sneezing, nasal congestion or excess secretions, fever, chills, sweats, unintended wt loss, pleuritic or exertional cp, hempoptysis, orthopnea pnd or leg swelling   05/31/2011 Since last ov ace inhib stopped and cough and wheeze is better.  Now dyspnea with exertion but not daily.  Now  no mucus.   No real wheeze Pt denies any significant sore throat, nasal congestion or excess secretions, fever, chills, sweats, unintended weight loss, pleurtic or exertional chest pain, orthopnea PND, or leg swelling Pt denies any increase in rescue therapy over baseline, denies waking up needing it or having any early am or nocturnal exacerbations of coughing/wheezing/or dyspnea. Pt also denies any obvious fluctuation in symptoms with  weather or environmental change or other alleviating or aggravating factors    Past Medical History  Diagnosis Date  . COPD (chronic obstructive pulmonary disease)     O2 dependent. Followed by Dr. Delford Field  . Fibromyalgia   . Hypertension   . OSA (obstructive sleep apnea)     Sleep study done April 02, 2008 showed severe obstructive sleep apnea.  . Diastolic dysfunction     Grade I diastolic dysfunction as shown on ECHO Dec 2011.  EF of 65-70%.  Marland Kitchen COPD (chronic obstructive pulmonary disease)     O2 dependent. Followed by Dr. Delford Field  . Dyslipidemia   . Osteopenia     By DXA scan 10/23/2009.  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 Corliss Skains on 11/10/2004.  . Osteoarthrosis involving multiple sites     . Chronic back pain   . Depression   . Anxiety   . Anemia   . GERD (gastroesophageal reflux disease)   . Meniere disease     Followed by ENT Dr. Lucky Cowboy  . Diverticulosis of colon   . Multiple pigmented nevi   . Hypokalemia   . S/P hysterectomy 1975  . Pneumonia 11/2004  . Knee pain   . Dysphagia   . Urosepsis 11/04/2010    Klebsiella pneumoniae  . Breast pain   . Allergic rhinitis   . Fibromyalgia      Family History  Problem Relation Age of Onset  . Ulcers Mother   . Ovarian cancer Sister 81  . Cervical cancer Daughter   . Cervical cancer Daughter   . Lung cancer Neg Hx   . Colon cancer Neg Hx   . Heart attack Father      History   Social History  . Marital Status: Widowed    Spouse Name: N/A    Number of Children: N/A  . Years of Education: N/A   Occupational History  . Not on file.   Social History Main Topics  . Smoking status: Former Smoker -- 1.0 packs/day for 30 years    Quit date: 12/07/2008  . Smokeless tobacco: Former Neurosurgeon    Types: Chew    Quit date: 12/06/1978  . Alcohol Use: No  . Drug Use: Not on file  . Sexually Active: Not on file  Other Topics Concern  . Not on file   Social History Narrative   Financial assistance approved for 100% discount after Medicare pays for [facility] only, not eligible for [facility] card. Per Rudell Cobb     Allergies  Allergen Reactions  . Lisinopril Other (See Comments)    Cough  . Tetracycline     REACTION: stomach upset     Outpatient Prescriptions Prior to Visit  Medication Sig Dispense Refill  . ALPRAZolam (XANAX) 0.5 MG tablet Take 1 to 1+1/2 tablets by mouth 3 (three) times a day as needed for anxiety       . amitriptyline (ELAVIL) 150 MG tablet Take 1 tablet (150 mg total) by mouth daily.  31 tablet  5  . aspirin 81 MG EC tablet Take 81 mg by mouth daily.        . Calcium Carbonate-Vit D-Min 600-400 MG-UNIT TABS Take 1 tablet by mouth 2 (two) times daily.        . carisoprodol (SOMA) 350 MG  tablet TAKE 1 TABLET BY MOUTH FOUR TIMES DAILY AS NEEDED FOR MUSCLE SPASM  120 tablet  5  . FLUTICASONE PROPIONATE, NASAL, NA 2 sprays. In each nostril once a day       . Fluticasone-Salmeterol (ADVAIR DISKUS) 250-50 MCG/DOSE AEPB Inhale 1 puff into the lungs 2 (two) times daily.        Marland Kitchen guaiFENesin (MUCINEX) 600 MG 12 hr tablet Take 600 mg by mouth 2 (two) times daily.        . hydrochlorothiazide 25 MG tablet Take 1 tablet (25 mg total) by mouth daily.  31 tablet  9  . HYDROcodone-acetaminophen (VICODIN ES) 7.5-750 MG per tablet TAKE 1 TABLET BY MOUTH FOUR TIMES DAILY AS NEEDED FOR PAIN  120 tablet  0  . ibuprofen (ADVIL,MOTRIN) 200 MG tablet Take 400 mg by mouth 2 (two) times daily as needed. For pain        . KLOR-CON 10 10 MEQ CR tablet TAKE 3 TABLETS BY MOUTH EVERY DAY  90 tablet  3  . LIDODERM 5 % APPLY 1 PATCH TO THE AFFECTED AREA OF SKIN ONCE DAILY AS DIRECTED , LEAVE ON FOR NO NORE THAN 12 HOURS EVERY DAY  30 each  2  . meclizine (ANTIVERT) 12.5 MG tablet TAKE 1 TABLET BY MOUTH TWICE DAILY AS NEEDED FOR VERTIGO  60 tablet  1  . NIFEdipine (PROCARDIA-XL) 90 MG (OSM) 24 hr tablet Take 90 mg by mouth daily.        Marland Kitchen omeprazole (PRILOSEC) 20 MG capsule Take 1 capsule (20 mg total) by mouth daily.  30 capsule  11  . POLYSACCHARIDE IRON (NU-IRON) 150 MG CAPS Take 1 capsule by mouth at bedtime.        . sertraline (ZOLOFT) 100 MG tablet TAKE 1 TABLET BY MOUTH TWO TIMES A DAY  60 tablet  5  . tiotropium (SPIRIVA HANDIHALER) 18 MCG inhalation capsule Place 18 mcg into inhaler and inhale daily.        Marland Kitchen albuterol (PROAIR HFA) 108 (90 BASE) MCG/ACT inhaler Inhale 2 puffs into the lungs 4 (four) times daily as needed.        . Magnesium 500 MG TABS Take 1 tablet by mouth daily.        . Red Yeast Rice Extract 600 MG CAPS Take 1 capsule by mouth daily.           Review of Systems Constitutional:   No  weight loss, night  sweats,  Fevers, chills, fatigue, lassitude. HEENT:   No headaches,   Difficulty swallowing,  Tooth/dental problems,  Sore throat,                No sneezing, itching, ear ache, nasal congestion, post nasal drip,   CV:  No chest pain,  Orthopnea, PND, swelling in lower extremities, anasarca, dizziness, palpitations  GI  No heartburn, indigestion, abdominal pain, nausea, vomiting, diarrhea, change in bowel habits, loss of appetite  Resp: Notes shortness of breath with exertion not  at rest.  No excess mucus, no productive cough,  No non-productive cough,  No coughing up of blood.  No change in color of mucus.  No wheezing.  No chest wall deformity  Skin: no rash or lesions.  GU: no dysuria, change in color of urine, no urgency or frequency.  No flank pain.  MS:  No joint pain or swelling.  No decreased range of motion.  No back pain.  Psych:  No change in mood or affect. No depression or anxiety.  No memory loss.     Objective:   Physical Exam Filed Vitals:   05/31/11 1049  BP: 122/80  Pulse: 87  Temp: 98.6 F (37 C)  TempSrc: Oral  Height: 5\' 3"  (1.6 m)  Weight: 238 lb (107.956 kg)  SpO2: 94%    Gen: Pleasant, well-nourished, in no distress,  normal affect  ENT: No lesions,  mouth clear,  oropharynx clear, no postnasal drip  Neck: No JVD, no TMG, no carotid bruits  Lungs: No use of accessory muscles, no dullness to percussion, distant BS  Cardiovascular: RRR, heart sounds normal, no murmur or gallops, no peripheral edema  Abdomen: soft and NT, no HSM,  BS normal  Musculoskeletal: No deformities, no cyanosis or clubbing  Neuro: alert, non focal  Skin: Warm, no lesions or rashes        Assessment & Plan:   COPD Stable Copd golds III, improved with weight loss   Plan No change in inhaled or maintenance medications. Return in  6 months     Updated Medication List Outpatient Encounter Prescriptions as of 05/31/2011  Medication Sig Dispense Refill  . albuterol (PROAIR HFA) 108 (90 BASE) MCG/ACT inhaler Inhale 2 puffs into  the lungs 4 (four) times daily as needed.  1 Inhaler  6  . ALPRAZolam (XANAX) 0.5 MG tablet Take 1 to 1+1/2 tablets by mouth 3 (three) times a day as needed for anxiety       . amitriptyline (ELAVIL) 150 MG tablet Take 1 tablet (150 mg total) by mouth daily.  31 tablet  5  . aspirin 81 MG EC tablet Take 81 mg by mouth daily.        . Calcium Carbonate-Vit D-Min 600-400 MG-UNIT TABS Take 1 tablet by mouth 2 (two) times daily.        . carisoprodol (SOMA) 350 MG tablet TAKE 1 TABLET BY MOUTH FOUR TIMES DAILY AS NEEDED FOR MUSCLE SPASM  120 tablet  5  . FLUTICASONE PROPIONATE, NASAL, NA 2 sprays. In each nostril once a day       . Fluticasone-Salmeterol (ADVAIR DISKUS) 250-50 MCG/DOSE AEPB Inhale 1 puff into the lungs 2 (two) times daily.        Marland Kitchen guaiFENesin (MUCINEX) 600 MG 12 hr tablet Take 600 mg by mouth 2 (two) times daily.        . hydrochlorothiazide 25 MG tablet Take 1 tablet (25 mg total) by mouth daily.  31  tablet  9  . HYDROcodone-acetaminophen (VICODIN ES) 7.5-750 MG per tablet TAKE 1 TABLET BY MOUTH FOUR TIMES DAILY AS NEEDED FOR PAIN  120 tablet  0  . ibuprofen (ADVIL,MOTRIN) 200 MG tablet Take 400 mg by mouth 2 (two) times daily as needed. For pain        . KLOR-CON 10 10 MEQ CR tablet TAKE 3 TABLETS BY MOUTH EVERY DAY  90 tablet  3  . LIDODERM 5 % APPLY 1 PATCH TO THE AFFECTED AREA OF SKIN ONCE DAILY AS DIRECTED , LEAVE ON FOR NO NORE THAN 12 HOURS EVERY DAY  30 each  2  . meclizine (ANTIVERT) 12.5 MG tablet TAKE 1 TABLET BY MOUTH TWICE DAILY AS NEEDED FOR VERTIGO  60 tablet  1  . NIFEdipine (PROCARDIA-XL) 90 MG (OSM) 24 hr tablet Take 90 mg by mouth daily.        Marland Kitchen omeprazole (PRILOSEC) 20 MG capsule Take 1 capsule (20 mg total) by mouth daily.  30 capsule  11  . POLYSACCHARIDE IRON (NU-IRON) 150 MG CAPS Take 1 capsule by mouth at bedtime.        . sertraline (ZOLOFT) 100 MG tablet TAKE 1 TABLET BY MOUTH TWO TIMES A DAY  60 tablet  5  . tiotropium (SPIRIVA HANDIHALER) 18 MCG  inhalation capsule Place 18 mcg into inhaler and inhale daily.        Marland Kitchen DISCONTD: albuterol (PROAIR HFA) 108 (90 BASE) MCG/ACT inhaler Inhale 2 puffs into the lungs 4 (four) times daily as needed.        Marland Kitchen DISCONTD: Magnesium 500 MG TABS Take 1 tablet by mouth daily.        Marland Kitchen DISCONTD: Red Yeast Rice Extract 600 MG CAPS Take 1 capsule by mouth daily.

## 2011-05-31 NOTE — Assessment & Plan Note (Signed)
Stable Copd golds III, improved with weight loss   Plan No change in inhaled or maintenance medications. Return in  6 months

## 2011-06-03 ENCOUNTER — Other Ambulatory Visit: Payer: Self-pay | Admitting: Critical Care Medicine

## 2011-06-05 ENCOUNTER — Other Ambulatory Visit: Payer: Self-pay | Admitting: Internal Medicine

## 2011-06-05 ENCOUNTER — Other Ambulatory Visit: Payer: Self-pay | Admitting: Critical Care Medicine

## 2011-06-05 DIAGNOSIS — M549 Dorsalgia, unspecified: Secondary | ICD-10-CM

## 2011-06-05 DIAGNOSIS — IMO0001 Reserved for inherently not codable concepts without codable children: Secondary | ICD-10-CM

## 2011-06-05 DIAGNOSIS — F411 Generalized anxiety disorder: Secondary | ICD-10-CM

## 2011-06-05 DIAGNOSIS — M159 Polyosteoarthritis, unspecified: Secondary | ICD-10-CM

## 2011-06-07 NOTE — Telephone Encounter (Signed)
Refill approved - nurse to complete. 

## 2011-06-08 ENCOUNTER — Other Ambulatory Visit: Payer: Self-pay | Admitting: Internal Medicine

## 2011-06-08 NOTE — Telephone Encounter (Signed)
I just refilled both of these medications on 06/05/11; please confirm that the pharmacy received the prescriptions.

## 2011-06-08 NOTE — Telephone Encounter (Signed)
Refills called to the [facility].

## 2011-06-10 NOTE — Telephone Encounter (Signed)
Pharmacy called and pt has received these meds.

## 2011-06-14 ENCOUNTER — Other Ambulatory Visit: Payer: Medicare Other

## 2011-06-14 DIAGNOSIS — R7309 Other abnormal glucose: Secondary | ICD-10-CM

## 2011-06-15 LAB — HEMOGLOBIN A1C
Hgb A1c MFr Bld: 6 % — ABNORMAL HIGH (ref <5.7)
Mean Plasma Glucose: 126 mg/dL — ABNORMAL HIGH (ref <117)

## 2011-06-15 LAB — GLUCOSE, RANDOM: Glucose, Bld: 88 mg/dL (ref 70–99)

## 2011-06-20 ENCOUNTER — Encounter: Payer: Self-pay | Admitting: Internal Medicine

## 2011-06-30 ENCOUNTER — Ambulatory Visit (INDEPENDENT_AMBULATORY_CARE_PROVIDER_SITE_OTHER): Payer: Medicare Other | Admitting: Internal Medicine

## 2011-06-30 ENCOUNTER — Encounter: Payer: Self-pay | Admitting: Internal Medicine

## 2011-06-30 VITALS — BP 133/68 | HR 78 | Temp 97.0°F | Ht 63.0 in | Wt 234.9 lb

## 2011-06-30 DIAGNOSIS — I10 Essential (primary) hypertension: Secondary | ICD-10-CM

## 2011-06-30 DIAGNOSIS — F329 Major depressive disorder, single episode, unspecified: Secondary | ICD-10-CM

## 2011-06-30 DIAGNOSIS — J449 Chronic obstructive pulmonary disease, unspecified: Secondary | ICD-10-CM

## 2011-06-30 DIAGNOSIS — R7309 Other abnormal glucose: Secondary | ICD-10-CM

## 2011-06-30 NOTE — Assessment & Plan Note (Signed)
Patient is doing well on current regimen, without active symptoms of depression.  The plan is to continue sertraline and amitriptyline at current dose.

## 2011-06-30 NOTE — Assessment & Plan Note (Signed)
Glucose, Bld  Date Value Range Status  06/14/2011 88  70-99 (mg/dL) Final     Hemoglobin Z6X  Date Value Range Status  06/14/2011 6.0* <5.7 (%) Final     Patient has had some elevated blood sugars on prior labs, but a recent fasting blood sugar was 88 and her hemoglobin A1c was 6.0, which do not meet the definition for diabetes.  I discussed the advisability of avoiding concentrated sweets and of continued weight loss.  I also discussed the option of referral to our diabetes educator if her blood sugars remain elevated or if her hyperglycemia worsens.

## 2011-06-30 NOTE — Patient Instructions (Signed)
Your blood sugar has been mildly elevated on recent measurement; please avoid concentrated sweets. Please schedule a follow-up appointment with your foot doctor (podiatrist).

## 2011-06-30 NOTE — Assessment & Plan Note (Signed)
Lab Results  Component Value Date   NA 132* 04/21/2011   K 4.0 04/21/2011   CL 94* 04/21/2011   CO2 27 04/21/2011   BUN 6 04/21/2011   CREATININE 0.60 04/21/2011   CREATININE 0.57 11/19/2010    BP Readings from Last 3 Encounters:  06/30/11 133/68  05/31/11 122/80  01/06/11 124/87    Assessment: Hypertension control:  controlled  Progress toward goals:  at goal Barriers to meeting goals:  no barriers identified  Plan: Hypertension treatment:  continue current medications

## 2011-06-30 NOTE — Progress Notes (Signed)
  Subjective:    Patient ID: Jennifer [name], female    DOB: 1941/11/15, 70 y.o.   MRN: [id number]  HPI patient returns for followup of her COPD, hypertension, chronic pain, hyperglycemia, and other chronic problems.  Today she is doing well without acute complaints. She reports that she is compliant with her medications.  She is using her CPAP without problems.  She has lost weight since her last visit and this is intentional; she has been trying to eat a healthier diet and her daughter has been assisting with meal preparation.  Her chronic pain is well controlled on her current regimen.  Her depression is well controlled, with no recent active symptoms.   Review of Systems  Constitutional: Negative for fever, chills and diaphoresis.  Respiratory: Negative for cough and shortness of breath.   Cardiovascular: Negative for chest pain, palpitations and leg swelling.  Gastrointestinal: Positive for nausea (Occasional nausea.). Negative for vomiting and abdominal pain.  Genitourinary: Negative for dysuria.       Objective:   Physical Exam  Constitutional: No distress.  Cardiovascular: Normal rate, regular rhythm and normal heart sounds.  Exam reveals no gallop and no friction rub.   No murmur heard. Pulmonary/Chest: Effort normal and breath sounds normal. She has no wheezes. She has no rales.  Abdominal: Soft. Bowel sounds are normal. She exhibits no distension. There is no hepatosplenomegaly. There is no tenderness. There is no rebound and no guarding.  Musculoskeletal: She exhibits no edema.  Skin:             Assessment & Plan:

## 2011-06-30 NOTE — Assessment & Plan Note (Signed)
Patient is doing well on current regimen, without any recent exacerbations.  The plan is to continue her medications, and she will followup with her pulmonologist Dr. Delford Field as scheduled.

## 2011-07-01 LAB — BASIC METABOLIC PANEL WITH GFR
BUN: 7 mg/dL (ref 6–23)
Calcium: 9.6 mg/dL (ref 8.4–10.5)
Creat: 0.66 mg/dL (ref 0.50–1.10)
GFR, Est African American: >60 mL/min (ref >60)
GFR, Est Non African American: >60 mL/min (ref >60)

## 2011-07-05 ENCOUNTER — Other Ambulatory Visit: Payer: Self-pay | Admitting: *Deleted

## 2011-07-05 ENCOUNTER — Other Ambulatory Visit: Payer: Self-pay | Admitting: Internal Medicine

## 2011-07-05 MED ORDER — OMEPRAZOLE 20 MG PO CPDR: 20.0000 mg | DELAYED_RELEASE_CAPSULE | Freq: Two times a day (BID) | ORAL | Status: DC

## 2011-07-05 NOTE — Telephone Encounter (Signed)
Omeprazole dose increased to BID.  Please have her call if her symptoms persist.

## 2011-07-05 NOTE — Telephone Encounter (Signed)
Pt informed and will call if any problems

## 2011-07-05 NOTE — Telephone Encounter (Signed)
Pt called and would like to take omeprazole bid because she is having heart burn at night.  Will you change to this and send in new Rx or do you want pt to be seen?

## 2011-07-27 ENCOUNTER — Telehealth: Payer: Self-pay | Admitting: *Deleted

## 2011-07-27 DIAGNOSIS — G4733 Obstructive sleep apnea (adult) (pediatric): Secondary | ICD-10-CM

## 2011-07-27 DIAGNOSIS — J449 Chronic obstructive pulmonary disease, unspecified: Secondary | ICD-10-CM

## 2011-07-27 NOTE — Telephone Encounter (Signed)
Received paperwork from [facility] requesting home O2 recertification.  Verbal order per Dr Meredith Pel, pt to be scheduled for overnight pulse oximetry.  Order faxed to [facility]. Note forwarded for md signature.Criss Alvine, Deke Tilghman Cassady8/21/201210:07 AM

## 2011-08-02 ENCOUNTER — Other Ambulatory Visit: Payer: Self-pay | Admitting: Internal Medicine

## 2011-08-05 NOTE — Telephone Encounter (Signed)
Overnight pulse oximetry ordered as below.

## 2011-09-01 ENCOUNTER — Other Ambulatory Visit: Payer: Self-pay | Admitting: Internal Medicine

## 2011-09-01 NOTE — Telephone Encounter (Signed)
Refill approved - nurse to complete. 

## 2011-09-02 ENCOUNTER — Other Ambulatory Visit: Payer: Self-pay | Admitting: Internal Medicine

## 2011-09-02 NOTE — Telephone Encounter (Signed)
Refill called to the [facility] per pt request.

## 2011-09-02 NOTE — Telephone Encounter (Signed)
Refills called to the [facility] pharmacy.

## 2011-09-05 ENCOUNTER — Other Ambulatory Visit: Payer: Self-pay | Admitting: Critical Care Medicine

## 2011-09-05 ENCOUNTER — Other Ambulatory Visit: Payer: Self-pay | Admitting: Internal Medicine

## 2011-09-10 LAB — CREATININE, SERUM
Creatinine, Ser: 0.62
GFR calc Af Amer: >60
GFR calc non Af Amer: >60

## 2011-09-13 ENCOUNTER — Telehealth: Payer: Self-pay | Admitting: *Deleted

## 2011-09-13 NOTE — Telephone Encounter (Signed)
Call from [employer] for Prior Authorization.  Prior Authorization was given by [employer] for [employer].

## 2011-09-23 LAB — BASIC METABOLIC PANEL
BUN: 5 — ABNORMAL LOW
GFR calc Af Amer: >60
GFR calc non Af Amer: >60
GFR calc non Af Amer: >60
Glucose, Bld: 109 — ABNORMAL HIGH
Potassium: 3.5
Potassium: 4.1
Sodium: 138
Sodium: 145

## 2011-09-23 LAB — COMPREHENSIVE METABOLIC PANEL
ALT: 14
AST: 16
Alkaline Phosphatase: 86
CO2: 34 — ABNORMAL HIGH
Calcium: 8.7
GFR calc Af Amer: >60
GFR calc non Af Amer: >60
Potassium: 2.3 — CL
Sodium: 135

## 2011-09-23 LAB — CBC
HCT: 27.9 — ABNORMAL LOW
HCT: 29 — ABNORMAL LOW
Hemoglobin: 9.1 — ABNORMAL LOW
Hemoglobin: 9.5 — ABNORMAL LOW
MCHC: 31.6
Platelets: 440 — ABNORMAL HIGH
RBC: 3.27 — ABNORMAL LOW
RBC: 3.35 — ABNORMAL LOW
WBC: 11 — ABNORMAL HIGH
WBC: 12.7 — ABNORMAL HIGH
WBC: 9.3

## 2011-09-23 LAB — FERRITIN: Ferritin: 80 (ref 10–291)

## 2011-09-23 LAB — DIFFERENTIAL
Eosinophils Absolute: 0.1
Eosinophils Relative: 1
Eosinophils Relative: 1
Lymphocytes Relative: 8 — ABNORMAL LOW
Lymphs Abs: 0.9
Lymphs Abs: 1.8
Monocytes Relative: 9

## 2011-09-23 LAB — URINALYSIS, ROUTINE W REFLEX MICROSCOPIC
Bilirubin Urine: NEGATIVE
Glucose, UA: NEGATIVE
Hgb urine dipstick: NEGATIVE
Ketones, ur: NEGATIVE
Protein, ur: NEGATIVE

## 2011-09-23 LAB — CULTURE, BLOOD (ROUTINE X 2): Culture: NO GROWTH

## 2011-09-23 LAB — MAGNESIUM: Magnesium: 2.1

## 2011-09-23 LAB — VITAMIN B12: Vitamin B-12: 444 (ref 211–911)

## 2011-09-23 LAB — URINE CULTURE: Culture: NO GROWTH

## 2011-09-23 LAB — RETICULOCYTES
RBC.: 5.15 — ABNORMAL HIGH
Retic Count, Absolute: 56.7

## 2011-09-23 LAB — IRON AND TIBC
Iron: 16 — ABNORMAL LOW
TIBC: 271

## 2011-09-23 LAB — PHOSPHORUS: Phosphorus: 3.1

## 2011-09-23 LAB — PROTIME-INR: Prothrombin Time: 14.2

## 2011-10-04 ENCOUNTER — Other Ambulatory Visit: Payer: Self-pay | Admitting: Internal Medicine

## 2011-10-05 ENCOUNTER — Other Ambulatory Visit: Payer: Self-pay | Admitting: *Deleted

## 2011-10-06 MED ORDER — LIDOCAINE 5 % EX PTCH: 1.0000 | MEDICATED_PATCH | CUTANEOUS | Status: DC

## 2011-10-08 ENCOUNTER — Ambulatory Visit (INDEPENDENT_AMBULATORY_CARE_PROVIDER_SITE_OTHER): Payer: Medicare Other

## 2011-10-08 DIAGNOSIS — Z23 Encounter for immunization: Secondary | ICD-10-CM

## 2011-10-12 ENCOUNTER — Telehealth: Payer: Self-pay | Admitting: *Deleted

## 2011-10-12 ENCOUNTER — Encounter: Payer: Self-pay | Admitting: Internal Medicine

## 2011-10-12 NOTE — Progress Notes (Signed)
I reviewed patient's recent oximetry report, which does not support a need for continuing home oxygen, and I forwarded the report to Dr. Delford Field who concurs.  The plan is to continue her CPAP at current settings but now on room air rather than supplemental oxygen.  I discussed this with our nurse, who will call in these instructions to [facility].

## 2011-10-12 NOTE — Telephone Encounter (Signed)
Called [facility] to give message cpap was to continue on present settings but on room air and home O2 was to be discontinued, the rep stated [facility] would need a script with this written order faxed to [phone], i called dr Meredith Pel, he will bring script Thursday and i will fax

## 2011-10-14 NOTE — Telephone Encounter (Signed)
Faxed to [facility]

## 2011-10-14 NOTE — Telephone Encounter (Signed)
Prescription done - nurse to complete.

## 2011-11-01 ENCOUNTER — Other Ambulatory Visit: Payer: Self-pay | Admitting: Internal Medicine

## 2011-11-02 NOTE — Telephone Encounter (Signed)
Zoloft rx refill request faxed to [facility] pharmacy.

## 2011-11-03 ENCOUNTER — Other Ambulatory Visit: Payer: Self-pay | Admitting: Internal Medicine

## 2011-11-08 ENCOUNTER — Other Ambulatory Visit: Payer: Self-pay | Admitting: Internal Medicine

## 2011-11-10 NOTE — Telephone Encounter (Signed)
Refill approved - nurse to complete. 

## 2011-11-10 NOTE — Telephone Encounter (Signed)
Soma rx called to [facility].

## 2011-11-17 ENCOUNTER — Ambulatory Visit: Payer: Medicare Other | Admitting: Internal Medicine

## 2011-11-26 ENCOUNTER — Other Ambulatory Visit: Payer: Self-pay | Admitting: Internal Medicine

## 2011-11-26 NOTE — Telephone Encounter (Signed)
Refill approved.

## 2011-12-24 ENCOUNTER — Other Ambulatory Visit: Payer: Self-pay | Admitting: Internal Medicine

## 2011-12-31 ENCOUNTER — Other Ambulatory Visit: Payer: Self-pay | Admitting: Critical Care Medicine

## 2011-12-31 ENCOUNTER — Other Ambulatory Visit: Payer: Self-pay | Admitting: Internal Medicine

## 2012-01-03 ENCOUNTER — Other Ambulatory Visit: Payer: Self-pay | Admitting: Internal Medicine

## 2012-01-03 NOTE — Telephone Encounter (Signed)
Refill approved - nurse to complete. 

## 2012-01-04 NOTE — Telephone Encounter (Signed)
Refills for Xanax and Vicodin ES were called to pt's pharmacy.  Angelina Ok, RN 01/04/2012 12:26 PM.

## 2012-01-12 ENCOUNTER — Ambulatory Visit: Payer: Medicare Other | Admitting: Internal Medicine

## 2012-01-28 ENCOUNTER — Other Ambulatory Visit: Payer: Self-pay | Admitting: Internal Medicine

## 2012-02-04 ENCOUNTER — Other Ambulatory Visit: Payer: Self-pay | Admitting: Internal Medicine

## 2012-02-04 DIAGNOSIS — I10 Essential (primary) hypertension: Secondary | ICD-10-CM

## 2012-02-15 ENCOUNTER — Other Ambulatory Visit: Payer: Self-pay | Admitting: Internal Medicine

## 2012-02-15 NOTE — Telephone Encounter (Signed)
This was refilled last month with 6 additional; please find out if pharmacy received the refill.

## 2012-02-16 ENCOUNTER — Ambulatory Visit (INDEPENDENT_AMBULATORY_CARE_PROVIDER_SITE_OTHER): Payer: Medicare Other | Admitting: Internal Medicine

## 2012-02-16 ENCOUNTER — Encounter: Payer: Self-pay | Admitting: Internal Medicine

## 2012-02-16 VITALS — BP 145/85 | HR 93 | Temp 97.0°F | Wt 239.1 lb

## 2012-02-16 DIAGNOSIS — E785 Hyperlipidemia, unspecified: Secondary | ICD-10-CM

## 2012-02-16 DIAGNOSIS — K219 Gastro-esophageal reflux disease without esophagitis: Secondary | ICD-10-CM

## 2012-02-16 DIAGNOSIS — M159 Polyosteoarthritis, unspecified: Secondary | ICD-10-CM

## 2012-02-16 DIAGNOSIS — G4733 Obstructive sleep apnea (adult) (pediatric): Secondary | ICD-10-CM

## 2012-02-16 DIAGNOSIS — I10 Essential (primary) hypertension: Secondary | ICD-10-CM

## 2012-02-16 DIAGNOSIS — R51 Headache: Secondary | ICD-10-CM

## 2012-02-16 DIAGNOSIS — J449 Chronic obstructive pulmonary disease, unspecified: Secondary | ICD-10-CM

## 2012-02-16 LAB — CBC WITH DIFFERENTIAL/PLATELET
Basophils Absolute: 0.1 10*3/uL (ref 0.0–0.1)
Basophils Relative: 1 % (ref 0–1)
Eosinophils Relative: 3 % (ref 0–5)
HCT: 38 % (ref 36.0–46.0)
Hemoglobin: 12.9 g/dL (ref 12.0–15.0)
Lymphocytes Relative: 39 % (ref 12–46)
MCHC: 33.9 g/dL (ref 30.0–36.0)
MCV: 97.4 fL (ref 78.0–100.0)
Monocytes Absolute: 0.8 10*3/uL (ref 0.1–1.0)
Monocytes Relative: 9 % (ref 3–12)
RDW: 12.5 % (ref 11.5–15.5)

## 2012-02-16 LAB — LIPID PANEL
LDL Cholesterol: 191 mg/dL — ABNORMAL HIGH (ref 0–99)
Total CHOL/HDL Ratio: 4.3 Ratio
Triglycerides: 175 mg/dL — ABNORMAL HIGH (ref <150)
VLDL: 35 mg/dL (ref 0–40)

## 2012-02-16 LAB — COMPLETE METABOLIC PANEL WITH GFR
AST: 20 U/L (ref 0–37)
Albumin: 4.3 g/dL (ref 3.5–5.2)
Alkaline Phosphatase: 81 U/L (ref 39–117)
Potassium: 3.7 mEq/L (ref 3.5–5.3)
Sodium: 133 mEq/L — ABNORMAL LOW (ref 135–145)
Total Protein: 6.7 g/dL (ref 6.0–8.3)

## 2012-02-16 MED ORDER — AMITRIPTYLINE HCL 150 MG PO TABS: 150.0000 mg | ORAL_TABLET | Freq: Every day | ORAL | Status: DC

## 2012-02-16 NOTE — Assessment & Plan Note (Signed)
Will check a lipid panel today.

## 2012-02-16 NOTE — Assessment & Plan Note (Signed)
Assessment: Patient reports that she is compliant with her CPAP.  Plan: Continue CPAP.

## 2012-02-16 NOTE — Progress Notes (Signed)
  Subjective:    Patient ID: Jennifer [name], female    DOB: May 13, 1941, 71 y.o.   MRN: [id number]  HPI Patient returns for followup of her hypertension, COPD, and other chronic medical problems.  She also reports recent problems with afternoon headaches which are usually right sided, last about one hour, and then resolve ; she occasionally takes a dose of her hydrocodone/acetaminophen for the headache.  She has no associated symptoms, including specifically no neurologic deficits, nausea, vomiting, visual changes, or other associated problems.  She continues to use her CPAP for obstructive sleep apnea.  Her respiratory status has been stable, with no recent problems.  She feels that her depression is well controlled on her current medication regimen.   Review of Systems  Constitutional: Negative for fever, chills and diaphoresis.  Respiratory: Negative for shortness of breath.   Cardiovascular: Negative for chest pain and leg swelling.  Gastrointestinal: Negative for nausea, vomiting, abdominal pain and blood in stool.  Genitourinary: Negative for dysuria and frequency.       Objective:   Physical Exam  Constitutional: She is oriented to person, place, and time. No distress.  HENT:  Head: Normocephalic and atraumatic.  Eyes: Conjunctivae and EOM are normal. Pupils are equal, round, and reactive to light. Right eye exhibits no discharge. Left eye exhibits no discharge.  Cardiovascular: Normal rate, regular rhythm and normal heart sounds.  Exam reveals no gallop and no friction rub.   No murmur heard. Pulmonary/Chest: Effort normal and breath sounds normal. She has no wheezes. She has no rales.  Abdominal: Soft. Bowel sounds are normal. She exhibits no distension. There is no tenderness. There is no rebound and no guarding.  Musculoskeletal: She exhibits no edema.  Neurological: She is alert and oriented to person, place, and time. She has normal strength. She displays no tremor. No cranial  nerve deficit.       No pronator drift; cerebellar intact by finger to nose.        Assessment & Plan:

## 2012-02-16 NOTE — Assessment & Plan Note (Signed)
Assessment: Patient reports a history of migraine headaches in the past, and her current headaches may be tension or tension migraine headaches.  They are self-limited and there are no associated neurologic symptoms.    Plan: Given her history of an intracranial aneurysm status post obliteration by Dr. Corliss Skains in 2005, I advised her to followup with Dr. Dr. Corliss Skains to see if he feels that any further imaging is needed.  I also advised her to be seen immediately if she develops any worsening symptoms or new associated symptoms.

## 2012-02-16 NOTE — Assessment & Plan Note (Addendum)
Assessment: Symptoms are well controlled on current dose of omeprazole.  Plan: Continue omeprazole 20 mg twice a day.

## 2012-02-16 NOTE — Patient Instructions (Addendum)
Please schedule a follow up appointment with Dr. Corliss Skains for evaluation of headache. Please call or return if you have any increase in the frequency or severity of your headaches, or if you have any other new associated symptoms.

## 2012-02-16 NOTE — Assessment & Plan Note (Signed)
Lab Results  Component Value Date   NA 131* 06/30/2011   K 3.7 06/30/2011   CL 95* 06/30/2011   CO2 27 06/30/2011   BUN 7 06/30/2011   CREATININE 0.66 06/30/2011    BP Readings from Last 3 Encounters:  02/16/12 145/85  06/30/11 133/68  05/31/11 122/80    Assessment: Hypertension control:  mildly elevated  Progress toward goals:  deteriorated Barriers to meeting goals:  no barriers identified  Plan: Hypertension treatment:  continue current medications; will recheck blood pressure at next visit, but not adjust medication today given her usual good control

## 2012-02-16 NOTE — Assessment & Plan Note (Signed)
Assessment: Patient has chronic pain, which is well-controlled on her current dose of hydrocodone/acetaminophen.  Plan: Continue hydrocodone/acetaminophen 7.5/750 one tablet 4 times a day as needed for pain.

## 2012-02-16 NOTE — Assessment & Plan Note (Signed)
Assessment: Patient is doing well off of home O2; she reports that she is using her inhaled bronchodilators as prescribed.  Plan: Continue current regimen.

## 2012-02-17 ENCOUNTER — Telehealth: Payer: Self-pay | Admitting: Internal Medicine

## 2012-02-17 DIAGNOSIS — E785 Hyperlipidemia, unspecified: Secondary | ICD-10-CM

## 2012-02-17 NOTE — Assessment & Plan Note (Signed)
Lipids:    Component Value Date/Time   CHOL 295* 02/16/2012 1237   TRIG 175* 02/16/2012 1237   HDL 69 02/16/2012 1237   LDLCALC 191* 02/16/2012 1237   VLDL 35 02/16/2012 1237   CHOLHDL 4.3 02/16/2012 1237    I spoke by phone with patient and discussed her lipid panel results.. She was on Crestor in 2008, and review of her chart shows that it was stopped due to possible muscle aches, although the association was not clear.  I talked with her about the option of trying a low dose of a statin with close attention to potential side effects, versus trying dietary management with a follow up lipid panel in 4-6 months.  She favors the latter option, so the plan will be a low fat diet, and repeat a lipid panel in 4-6 months.

## 2012-02-17 NOTE — Telephone Encounter (Signed)
I spoke by phone with patient and discussed her lipid panel results.. She was on Crestor in 2008, and review of her chart shows that it was stopped due to possible muscle aches, although the association was not clear.  I talked with her about the option of trying a low dose of a statin with close attention to potential side effects, versus trying dietary management with a follow up lipid panel in 4-6 months.  She favors the latter option, so the plan will be a low fat diet, and repeat a lipid panel in 4-6 months.

## 2012-02-23 NOTE — Telephone Encounter (Signed)
Please see my note from 3/12.

## 2012-02-23 NOTE — Telephone Encounter (Signed)
Spoke w/ pharmacy they did receive the electronic script and pt has picked up first of refills on that script on 3/13, if you will respond with duplicate request or already responded to so we may close this

## 2012-03-02 ENCOUNTER — Other Ambulatory Visit: Payer: Self-pay | Admitting: Internal Medicine

## 2012-03-03 ENCOUNTER — Other Ambulatory Visit: Payer: Self-pay | Admitting: Internal Medicine

## 2012-03-04 ENCOUNTER — Other Ambulatory Visit: Payer: Self-pay | Admitting: Internal Medicine

## 2012-03-06 NOTE — Telephone Encounter (Signed)
I don't think Medicare will cover this medication.  Please find out from pharmacy.

## 2012-03-09 ENCOUNTER — Other Ambulatory Visit (HOSPITAL_COMMUNITY): Payer: Self-pay | Admitting: Interventional Radiology

## 2012-03-09 DIAGNOSIS — R519 Headache, unspecified: Secondary | ICD-10-CM

## 2012-03-09 DIAGNOSIS — G9389 Other specified disorders of brain: Secondary | ICD-10-CM

## 2012-03-09 DIAGNOSIS — H539 Unspecified visual disturbance: Secondary | ICD-10-CM

## 2012-03-09 HISTORY — DX: Other specified disorders of brain: G93.89

## 2012-03-13 ENCOUNTER — Ambulatory Visit (HOSPITAL_COMMUNITY): Payer: Medicare Other

## 2012-03-13 ENCOUNTER — Ambulatory Visit (HOSPITAL_COMMUNITY): Payer: Medicare Other | Attending: Interventional Radiology

## 2012-03-21 ENCOUNTER — Ambulatory Visit (HOSPITAL_COMMUNITY)
Admission: RE | Admit: 2012-03-21 | Discharge: 2012-03-21 | Disposition: A | Discharge status: Home / Self Care | Payer: Medicare Other | Source: Ambulatory Visit | Attending: Interventional Radiology | Admitting: Interventional Radiology

## 2012-03-21 DIAGNOSIS — R519 Headache, unspecified: Secondary | ICD-10-CM

## 2012-03-21 DIAGNOSIS — J329 Chronic sinusitis, unspecified: Secondary | ICD-10-CM | POA: Insufficient documentation

## 2012-03-21 DIAGNOSIS — H539 Unspecified visual disturbance: Secondary | ICD-10-CM

## 2012-03-21 DIAGNOSIS — Z9889 Other specified postprocedural states: Secondary | ICD-10-CM | POA: Insufficient documentation

## 2012-03-21 DIAGNOSIS — I729 Aneurysm of unspecified site: Secondary | ICD-10-CM | POA: Insufficient documentation

## 2012-03-21 DIAGNOSIS — G9389 Other specified disorders of brain: Secondary | ICD-10-CM | POA: Insufficient documentation

## 2012-03-21 DIAGNOSIS — R51 Headache: Secondary | ICD-10-CM | POA: Insufficient documentation

## 2012-03-21 LAB — CREATININE, SERUM: GFR calc Af Amer: >90 mL/min (ref >90)

## 2012-03-21 MED ORDER — GADOBENATE DIMEGLUMINE 529 MG/ML IV SOLN
20.0000 mL | Freq: Once | INTRAVENOUS | Status: AC
  Administered 2012-03-21: 20 mL via INTRAVENOUS

## 2012-04-03 ENCOUNTER — Other Ambulatory Visit: Payer: Self-pay | Admitting: Internal Medicine

## 2012-04-03 ENCOUNTER — Other Ambulatory Visit: Payer: Self-pay | Admitting: Critical Care Medicine

## 2012-04-07 ENCOUNTER — Ambulatory Visit (INDEPENDENT_AMBULATORY_CARE_PROVIDER_SITE_OTHER): Payer: Medicare Other | Admitting: Ophthalmology

## 2012-04-07 ENCOUNTER — Telehealth: Payer: Self-pay | Admitting: *Deleted

## 2012-04-07 DIAGNOSIS — K1120 Sialoadenitis, unspecified: Secondary | ICD-10-CM | POA: Insufficient documentation

## 2012-04-07 DIAGNOSIS — K1370 Unspecified lesions of oral mucosa: Secondary | ICD-10-CM

## 2012-04-07 MED ORDER — CEPHALEXIN 500 MG PO CAPS: 500.0000 mg | ORAL_CAPSULE | Freq: Four times a day (QID) | ORAL | Status: AC

## 2012-04-07 NOTE — Telephone Encounter (Signed)
Agree with appt. Thank you!

## 2012-04-07 NOTE — Telephone Encounter (Signed)
Pt's dtr called stating pt has had swollen area in jaw for 3 days that is painful and causing problems for pt to wear CPAP at night because swelling is where one of straps contacts her face. Pt has appt with PCP Dr. Meredith Pel on May 15 but dtr requesting pt be seen sooner due to pain and discomfort. Dtr states pt has not run fever but swelling is visible and getting more uncomfortable. Pt given appt with Dr. Maisie Fus today to address this issue and dtr reminded to keep appt with Dr. Meredith Pel for other follow up. Dtr verbalizes understanding of this info and states she will bring pt to appt at 1:15 P today. Dorie Rank, RN, 04/07/2012, 11A

## 2012-04-07 NOTE — Patient Instructions (Signed)
-  Please take cephalexin 4 times a day for 1 week. -If you are not better in 1 week please follow up at regular appt with Dr. Meredith Pel.

## 2012-04-07 NOTE — Progress Notes (Signed)
Subjective:   Patient ID: Jennifer [name] female   DOB: [birth year] 71 y.o.   MRN: [id number]  HPI: Ms.Jennifer [name] is a 71 y.o. woman who presents with acute left submandibular swelling and pain. Her daughter and she report that she has had some swelling and pain in the area for the past several years but that it worsened acutely in the past 3 days. Patient has some pain with chewing, she admits she is using an old set of dentures since she broke her new ones, but she has been using them for many years. Patient also reports that she has pain with wearing CPAP at night since the strap puts pressure on this soft tissue. Patient reports that she has a dry mouth at baseline and she has to chew gum in order to have enough saliva to swallow her medications (drinking water alone doesn't help). 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   Past Medical History  Diagnosis Date  . COPD (chronic obstructive pulmonary disease)     O2 dependent. Followed by Dr. Delford Field  . Fibromyalgia   . Hypertension   . OSA (obstructive sleep apnea)     Sleep study done April 02, 2008 showed severe obstructive sleep apnea.  . Diastolic dysfunction     Grade I diastolic dysfunction as shown on ECHO Dec 2011.  EF of 65-70%.  Marland Kitchen COPD (chronic obstructive pulmonary disease)     O2 dependent. Followed by Dr. Delford Field  . Dyslipidemia   . Osteopenia     By DXA scan 10/23/2009.  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 Corliss Skains on 11/10/2004.  . Osteoarthrosis involving multiple sites   . Chronic back pain   . Depression   . Anxiety   . Anemia   . GERD (gastroesophageal reflux disease)   . Meniere disease     Followed by ENT Dr. Lucky Cowboy  . Diverticulosis of colon   . Multiple pigmented nevi   . Hypokalemia   . S/P hysterectomy 1975  . Pneumonia 11/2004  . Knee pain     . Dysphagia   . Urosepsis 11/04/2010    Klebsiella pneumoniae  . Breast pain   . Allergic rhinitis   . Fibromyalgia    Current Outpatient Prescriptions  Medication Sig Dispense Refill  . ADVAIR DISKUS 250-50 MCG/DOSE AEPB INHALE 1 PUFF BY MOUTH TWICE DAILY  60 each  0  . albuterol (PROAIR HFA) 108 (90 BASE) MCG/ACT inhaler Inhale 2 puffs into the lungs 4 (four) times daily as needed.  1 Inhaler  6  . ALPRAZolam (XANAX) 0.5 MG tablet TAKE 1 TO 1 &1/2 TABLETS BY MOUTH THREE TIMES DAILY AS NEEDED FOR ANXIETY  120 tablet  3  . amitriptyline (ELAVIL) 150 MG tablet Take 1 tablet (150 mg total) by mouth daily.  31 tablet  6  . aspirin 81 MG EC tablet Take 81 mg by mouth daily.        . Calcium Carbonate-Vitamin D (CALCIUM 600+D) 600-400 MG-UNIT per tablet Take 1 tablet by mouth 2 (two) times daily.  60 tablet  11  . carisoprodol (SOMA) 350 MG tablet Take 1 tablet (350 mg total) by mouth 4 (four) times daily as needed for muscle spasms.  120 tablet  5  . cephALEXin (KEFLEX) 500 MG capsule Take 1 capsule (500 mg total)  by mouth 4 (four) times daily.  28 capsule  0  . fluticasone (FLONASE) 50 MCG/ACT nasal spray INHALE 2 SPRAYS IN EACH NOSTRIL DAILY  16 g  6  . guaiFENesin (MUCINEX) 600 MG 12 hr tablet Take 600 mg by mouth 2 (two) times daily.        . hydrochlorothiazide (HYDRODIURIL) 25 MG tablet TAKE ONE TABLET BY MOUTH DAILY  31 tablet  6  . HYDROcodone-acetaminophen (VICODIN ES) 7.5-750 MG per tablet TAKE 1 TABLET BY MOUTH FOUR TIMES DAILY AS NEEDED FOR PAIN  120 tablet  3  . ibuprofen (ADVIL,MOTRIN) 200 MG tablet Take 400 mg by mouth 2 (two) times daily as needed. For pain        . iron polysaccharides (POLY-IRON 150) 150 MG capsule Take 1 capsule twice a day with meals.  60 capsule  3  . LIDODERM 5 % PLACE ONE PATCH ONTO THE SKIN DAILY , REMOVE AND DISCARD PATCH WITHIN 12 HOURS AS DIRECTED  30 each  3  . meclizine (ANTIVERT) 12.5 MG tablet Take 1 tablet (12.5 mg total) by mouth 2 (two) times  daily as needed for dizziness.  60 tablet  1  . NIFEdipine (PROCARDIA XL/ADALAT-CC) 90 MG 24 hr tablet Take 1 tablet (90 mg total) by mouth daily.  90 tablet  3  . omeprazole (PRILOSEC) 20 MG capsule TAKE 1 CAPSULE BY MOUTH TWICE DAILY  60 capsule  5  . potassium chloride (KLOR-CON 10) 10 MEQ tablet Take 3 tablets (30 mEq total) by mouth daily.  90 tablet  3  . sertraline (ZOLOFT) 100 MG tablet Take 1 tablet (100 mg total) by mouth 2 (two) times daily.  60 tablet  5  . SPIRIVA HANDIHALER 18 MCG inhalation capsule INHALE CONTENTS OF 1 CAPSULE ONCE A DAY  30 capsule  0   Family History  Problem Relation Age of Onset  . Ulcers Mother   . Ovarian cancer Sister 62  . Cervical cancer Daughter   . Cervical cancer Daughter   . Lung cancer Neg Hx   . Colon cancer Neg Hx   . Heart attack Father   . Cerebral aneurysm Sister 80   History   Social History  . Marital Status: Widowed    Spouse Name: N/A    Number of Children: N/A  . Years of Education: N/A   Social History Main Topics  . Smoking status: Former Smoker -- 1.0 packs/day for 30 years    Quit date: 12/07/2008  . Smokeless tobacco: Former Neurosurgeon    Types: Chew    Quit date: 12/06/1978  . Alcohol Use: No  . Drug Use: Not on file  . Sexually Active: Not on file   Other Topics Concern  . Not on file   Social History Narrative   Financial assistance approved for 100% discount after Medicare pays for [facility] only, not eligible for [facility] card. Per Jennifer [name]    Objective:  Physical Exam: There were no vitals filed for this visit. General: sitting in chair HEENT: PERRL, EOMI, no scleral icterus. Patient has a firm approximately 3cm nodule in the submandibular area. It is quite tender to palpation. Hearing is intact in left ear, is lessened in right ear chronically. No warmth, redness or skin changes noted. No TMJ tenderness. Mild tenderness of gums diffusely in posterior left side of mouth. No sialoliths or pustular discharge noted  under tongue. No lymphadenopathy noted. Neuro: alert and oriented X3, cranial nerves II-XII grossly intact  Assessment &  Plan:

## 2012-04-07 NOTE — Assessment & Plan Note (Addendum)
Given patient's marked tenderness, will treat empirically with cephalexin QID for 1 week as an infection although patient is afebrile. She has a follow-up appt scheduled with Dr. Meredith Pel already in less than 2 weeks. A more common etiology would be a sialolith that is causing obstruction but I would not expect this degree of tenderness. There is also a remote chance of cancer. If she doe not improve, could consider Head CT.

## 2012-04-13 ENCOUNTER — Other Ambulatory Visit (HOSPITAL_COMMUNITY): Payer: Self-pay | Admitting: Interventional Radiology

## 2012-04-13 DIAGNOSIS — I729 Aneurysm of unspecified site: Secondary | ICD-10-CM

## 2012-04-19 ENCOUNTER — Encounter: Payer: Self-pay | Admitting: Internal Medicine

## 2012-04-19 ENCOUNTER — Ambulatory Visit (HOSPITAL_COMMUNITY): Payer: Medicare Other

## 2012-04-19 ENCOUNTER — Other Ambulatory Visit: Payer: Self-pay | Admitting: Internal Medicine

## 2012-04-19 ENCOUNTER — Ambulatory Visit (INDEPENDENT_AMBULATORY_CARE_PROVIDER_SITE_OTHER): Payer: Medicare Other | Admitting: Internal Medicine

## 2012-04-19 VITALS — BP 138/90 | HR 80 | Temp 97.1°F | Ht 63.0 in | Wt 237.3 lb

## 2012-04-19 DIAGNOSIS — I10 Essential (primary) hypertension: Secondary | ICD-10-CM

## 2012-04-19 DIAGNOSIS — L309 Dermatitis, unspecified: Secondary | ICD-10-CM

## 2012-04-19 DIAGNOSIS — R32 Unspecified urinary incontinence: Secondary | ICD-10-CM

## 2012-04-19 DIAGNOSIS — G9389 Other specified disorders of brain: Secondary | ICD-10-CM

## 2012-04-19 DIAGNOSIS — L259 Unspecified contact dermatitis, unspecified cause: Secondary | ICD-10-CM

## 2012-04-19 DIAGNOSIS — I671 Cerebral aneurysm, nonruptured: Secondary | ICD-10-CM

## 2012-04-19 DIAGNOSIS — K1120 Sialoadenitis, unspecified: Secondary | ICD-10-CM

## 2012-04-19 DIAGNOSIS — IMO0001 Reserved for inherently not codable concepts without codable children: Secondary | ICD-10-CM

## 2012-04-19 DIAGNOSIS — M797 Fibromyalgia: Secondary | ICD-10-CM

## 2012-04-19 MED ORDER — TRIAMCINOLONE ACETONIDE 0.1 % EX CREA: TOPICAL_CREAM | Freq: Two times a day (BID) | CUTANEOUS | Status: DC

## 2012-04-19 MED ORDER — HYDROCODONE-ACETAMINOPHEN 7.5-750 MG PO TABS: 1.0000 | ORAL_TABLET | Freq: Four times a day (QID) | ORAL | Status: DC | PRN

## 2012-04-19 MED ORDER — ALPRAZOLAM 0.5 MG PO TABS: ORAL_TABLET | ORAL | Status: DC

## 2012-04-19 NOTE — Telephone Encounter (Signed)
These meds were called into [facility] today

## 2012-04-19 NOTE — Patient Instructions (Signed)
Apply triamcinolone 0.1% cream to affected area of hand twice a day for 2 weeks. Call if the hand rash persists or worsens. A referral was made to urology for evaluation of incontinence.

## 2012-04-19 NOTE — Progress Notes (Signed)
  Subjective:    Patient ID: Jennifer [name], female    DOB: Mar 24, 1941, 71 y.o.   MRN: [id number]  HPI Patient returns for followup of her chronic medical problems and for followup of a presumed left submandibular gland infection for which she was seen on May 3 in our clinic and treated with one week of oral cephalexin.  She reports that her symptoms, which included left submandibular swelling and tenderness, have resolved.  She currently has no swelling or tenderness, and no problems with the left ear.  She has upper and lower dentures, and reports no intraoral problems.  She reports that she has been compliant with her medications; there has been no change in her medications since her last visit.  Patient also reports a new symptom of a scaling rash on the palm of her right hand which has been present for a few weeks and has been enlarging.  She tried a moisturizing lotion and also tried changing her hand soap without improvement in the hand rash.  She also reports worsening of chronic urinary incontinence, to the point that she is having difficulty affording incontinence pads; she describes this as feeling an urge to void but often not making it to the bathroom.    Review of Systems  HENT: Negative for ear pain, neck pain and ear discharge.   Musculoskeletal: Positive for arthralgias (Chronic).  Neurologic: She denies any problems with memory or orientation; she uses a cane for ambulation.      Objective:   Physical Exam  Constitutional: No distress.  Cardiovascular: Normal rate, regular rhythm and normal heart sounds.  Exam reveals no gallop and no friction rub.   No murmur heard. Pulmonary/Chest: Effort normal and breath sounds normal. No respiratory distress. She has no wheezes. She has no rales.  Musculoskeletal: She exhibits no edema.  Lymphadenopathy:    She has no cervical adenopathy.  Neck: No swelling or tenderness of the left neck or submandibular area.      Assessment & Plan:

## 2012-04-20 ENCOUNTER — Ambulatory Visit (HOSPITAL_COMMUNITY)
Admission: RE | Admit: 2012-04-20 | Discharge: 2012-04-20 | Disposition: A | Discharge status: Home / Self Care | Payer: Medicare Other | Source: Ambulatory Visit | Attending: Interventional Radiology | Admitting: Interventional Radiology

## 2012-04-20 ENCOUNTER — Encounter: Payer: Self-pay | Admitting: Internal Medicine

## 2012-04-20 DIAGNOSIS — I729 Aneurysm of unspecified site: Secondary | ICD-10-CM

## 2012-04-20 DIAGNOSIS — M797 Fibromyalgia: Secondary | ICD-10-CM | POA: Insufficient documentation

## 2012-04-20 LAB — URINALYSIS, ROUTINE W REFLEX MICROSCOPIC
Bilirubin Urine: NEGATIVE
Glucose, UA: NEGATIVE mg/dL
Hgb urine dipstick: NEGATIVE
Ketones, ur: NEGATIVE mg/dL
Nitrite: NEGATIVE
Specific Gravity, Urine: 1.013 (ref 1.005–1.030)
pH: 7 (ref 5.0–8.0)

## 2012-04-20 LAB — URINALYSIS, MICROSCOPIC ONLY
Crystals: NONE SEEN
Squamous Epithelial / LPF: NONE SEEN

## 2012-04-20 NOTE — Assessment & Plan Note (Signed)
Assessment: Patient's symptoms resolved with 1 week of oral cephalexin.  Exam shows no swelling or tenderness.  Plan: I advised patient to call or return if her symptoms recur.

## 2012-04-20 NOTE — Assessment & Plan Note (Signed)
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  Plan: Treat with triamcinolone 0.1% cream topically twice a day for 2 weeks.  I advised patient to call if her symptoms persist or worsen.

## 2012-04-20 NOTE — Assessment & Plan Note (Signed)
Assessment: An MRI of the brain on 03/09/2012 ordered by Dr. Corliss Skains showed moderate ventricular enlargement out of proportion to the degree of cortical atrophy, most evident when compared with 2008, with a comment that normal  pressure hydrocephalus is not excluded.  Patient has follow up this week with Dr. Corliss Skains.  Patient has urge incontinence, and reports occasional tripping when walking but does not seem to have typical gait problems suggestive of NPH; she has no apparent cognitive deficits by history.  Plan: Will await Dr. Fatima Sanger opinion about further workup, and consider neurology referral depending upon his plans.

## 2012-04-20 NOTE — Assessment & Plan Note (Signed)
Assessment: Patient underwent endovascular occlusion of a large right internal carotid intracranial aneurysm by Dr. Corliss Skains 11/10/2004.  MRA of the head on 03/09/2012 showed no evidence for recanalization of the previously treated right  paraophthalmic internal carotid artery aneurysm.  Her headaches have improved since her last visit.  Plan: Patient will see Dr. Corliss Skains this week as scheduled.

## 2012-04-20 NOTE — Assessment & Plan Note (Signed)
Assessment: Patient's chronic pain is reasonably well controlled on her current regimen.  Plan: Continue current medications.

## 2012-04-20 NOTE — Assessment & Plan Note (Signed)
Assessment: Patient has chronic urge incontinence which has worsened to the point that she is using multiple protective pads daily.  Plan: Check urinalysis and urine culture; refer to urology for assessment and treatment.

## 2012-04-20 NOTE — Assessment & Plan Note (Signed)
Lab Results  Component Value Date   NA 133* 02/16/2012   K 3.7 02/16/2012   CL 96 02/16/2012   CO2 28 02/16/2012   BUN 8 02/16/2012   CREATININE 0.55 03/21/2012   CREATININE 0.61 02/16/2012    BP Readings from Last 3 Encounters:  04/19/12 138/90  02/16/12 145/85  06/30/11 133/68    Assessment: Hypertension control:  borderline control  Progress toward goals:  at goal Barriers to meeting goals:  no barriers identified  Plan: Hypertension treatment:  continue current medications

## 2012-04-21 LAB — URINE CULTURE: Organism ID, Bacteria: NO GROWTH

## 2012-04-29 ENCOUNTER — Other Ambulatory Visit: Payer: Self-pay | Admitting: Critical Care Medicine

## 2012-05-02 ENCOUNTER — Encounter: Payer: Self-pay | Admitting: Critical Care Medicine

## 2012-05-02 ENCOUNTER — Ambulatory Visit (INDEPENDENT_AMBULATORY_CARE_PROVIDER_SITE_OTHER): Payer: Medicare Other | Admitting: Critical Care Medicine

## 2012-05-02 VITALS — BP 122/80 | HR 92 | Temp 98.3°F | Ht 63.0 in | Wt 232.2 lb

## 2012-05-02 DIAGNOSIS — Z9989 Dependence on other enabling machines and devices: Secondary | ICD-10-CM

## 2012-05-02 DIAGNOSIS — J449 Chronic obstructive pulmonary disease, unspecified: Secondary | ICD-10-CM

## 2012-05-02 DIAGNOSIS — G4733 Obstructive sleep apnea (adult) (pediatric): Secondary | ICD-10-CM

## 2012-05-02 MED ORDER — TIOTROPIUM BROMIDE MONOHYDRATE 18 MCG IN CAPS: 18.0000 ug | ORAL_CAPSULE | Freq: Every day | RESPIRATORY_TRACT | Status: DC

## 2012-05-02 MED ORDER — FLUTICASONE-SALMETEROL 250-50 MCG/DOSE IN AEPB: 1.0000 | INHALATION_SPRAY | Freq: Two times a day (BID) | RESPIRATORY_TRACT | Status: DC

## 2012-05-02 NOTE — Patient Instructions (Signed)
A new CPAP machine will be obtained No change in medications Return 12 months or sooner as needed

## 2012-05-02 NOTE — Assessment & Plan Note (Signed)
Severe sleep apnea rx with [id number] Full face mask Needs new sleep cpap machine Plan Order new cpap

## 2012-05-02 NOTE — Progress Notes (Signed)
Subjective:    Patient ID: Jennifer [name], female    DOB: 12/20/1940, 71 y.o.   MRN: [id number]  HPI  History of Present Illness:  71 y.o.    last smoked in 2009 with chronic obstructive lung disease, asthmatic bronchitic component.   November 23, 2010 ov discharge 10 days prior to ov now on ACE with worse coughing dry and hacky and worse sob. Pt denies any significant sore throat, dysphagia, itching, sneezing, nasal congestion or excess secretions, fever, chills, sweats, unintended wt loss, pleuritic or exertional cp, hempoptysis, orthopnea pnd or leg swelling   05/31/11 Since last ov ace inhib stopped and cough and wheeze is better.  Now dyspnea with exertion but not daily.  Now  no mucus.   No real wheeze Pt denies any significant sore throat, nasal congestion or excess secretions, fever, chills, sweats, unintended weight loss, pleurtic or exertional chest pain, orthopnea PND, or leg swelling Pt denies any increase in rescue therapy over baseline, denies waking up needing it or having any early am or nocturnal exacerbations of coughing/wheezing/or dyspnea. Pt also denies any obvious fluctuation in symptoms with  weather or environmental change or other alleviating or aggravating factors  05/02/2012 No exacerbations since last OV 6/12.  No severe chronic cough.  Dyspnea is doing well. Pt denies any significant sore throat, nasal congestion or excess secretions, fever, chills, sweats, unintended weight loss, pleurtic or exertional chest pain, orthopnea PND, or leg swelling Pt denies any increase in rescue therapy over baseline, denies waking up needing it or having any early am or nocturnal exacerbations of coughing/wheezing/or dyspnea. Pt also denies any obvious fluctuation in symptoms with  weather or environmental change or other alleviating or aggravating factors    Past Medical History  Diagnosis Date  . COPD (chronic obstructive pulmonary disease)     O2 dependent. Followed by Dr.  Delford Field  . Fibromyalgia   . Hypertension   . OSA (obstructive sleep apnea)     Sleep study done April 02, 2008 showed severe obstructive sleep apnea.  . Diastolic dysfunction     Grade I diastolic dysfunction as shown on ECHO Dec 2011.  EF of 65-70%.  Marland Kitchen COPD (chronic obstructive pulmonary disease)     O2 dependent. Followed by Dr. Delford Field  . Dyslipidemia   . Osteopenia     By DXA scan 10/23/2009.  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 Corliss Skains on 11/10/2004.  MRA of the head on 03/09/2012 showed no evidence for recanalization.  . Osteoarthrosis involving multiple sites   . Chronic back pain   . Depression   . Anxiety   . Anemia   . GERD (gastroesophageal reflux disease)   . Meniere disease     Followed by ENT Dr. Lucky Cowboy  . Diverticulosis of colon   . Multiple pigmented nevi   . Hypokalemia   . S/P hysterectomy 1975  . Pneumonia 11/2004  . Knee pain   . Dysphagia   . Urosepsis 11/04/2010    Klebsiella pneumoniae  . Breast pain   . Allergic rhinitis   . Fibromyalgia   . 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     . Urinary incontinence      Family  History  Problem Relation Age of Onset  . Ulcers Mother   . Ovarian cancer Sister 56  . Cervical cancer Daughter   . Cervical cancer Daughter   . Lung cancer Neg Hx   . Colon cancer Neg Hx   . Heart attack Father   . Cerebral aneurysm Sister 52     History   Social History  . Marital Status: Widowed    Spouse Name: N/A    Number of Children: N/A  . Years of Education: N/A   Occupational History  . Not on file.   Social History Main Topics  . Smoking status: Former Smoker -- 0.5 packs/day for 30 years    Types:  Cigarettes    Quit date: 12/07/2008  . Smokeless tobacco: Former Neurosurgeon    Types: Chew    Quit date: 12/06/1978  . Alcohol Use: No  . Drug Use: Not on file  . Sexually Active: Not on file   Other Topics Concern  . Not on file   Social History Narrative   Financial assistance approved for 100% discount after Medicare pays for [facility] only, not eligible for [facility] card. Per Rudell Cobb     Allergies  Allergen Reactions  . Lisinopril Other (See Comments)    Cough  . Tetracycline     REACTION: stomach upset     Outpatient Prescriptions Prior to Visit  Medication Sig Dispense Refill  . ADVAIR DISKUS 250-50 MCG/DOSE AEPB INHALE 1 PUFF BY MOUTH TWICE DAILY  60 each  0  . ALPRAZolam (XANAX) 0.5 MG tablet Take 1 to 1 and 1/2 tablets by mouth three times a day as needed for anxiety.  120 tablet  3  . amitriptyline (ELAVIL) 150 MG tablet Take 1 tablet (150 mg total) by mouth daily.  31 tablet  6  . aspirin 81 MG EC tablet Take 81 mg by mouth daily.        . Calcium Carbonate-Vitamin D (CALCIUM 600+D) 600-400 MG-UNIT per tablet Take 1 tablet by mouth 2 (two) times daily.  60 tablet  11  . carisoprodol (SOMA) 350 MG tablet Take 1 tablet (350 mg total) by mouth 4 (four) times daily as needed for muscle spasms.  120 tablet  5  . fluticasone (FLONASE) 50 MCG/ACT nasal spray INHALE 2 SPRAYS IN EACH NOSTRIL DAILY  16 g  6  . guaiFENesin (MUCINEX) 600 MG 12 hr tablet Take 600 mg by mouth 2 (two) times daily.        . hydrochlorothiazide (HYDRODIURIL) 25 MG tablet TAKE ONE TABLET BY MOUTH DAILY  31 tablet  6  . HYDROcodone-acetaminophen (VICODIN ES) 7.5-750 MG per tablet Take 1 tablet by mouth 4 (four) times daily as needed for pain.  120 tablet  3  . ibuprofen (ADVIL,MOTRIN) 200 MG tablet Take 400 mg by mouth 2 (two) times daily as needed. For pain        . LIDODERM 5 % PLACE ONE PATCH ONTO THE SKIN DAILY , REMOVE AND DISCARD PATCH WITHIN 12 HOURS AS DIRECTED  30 each  3  . meclizine (ANTIVERT) 12.5 MG  tablet Take 1 tablet (12.5 mg total) by mouth 2 (two) times daily as needed for dizziness.  60 tablet  1  . NIFEdipine (PROCARDIA XL/ADALAT-CC) 90 MG 24 hr tablet Take 1 tablet (90 mg total) by mouth daily.  90 tablet  3  . omeprazole (PRILOSEC) 20 MG capsule TAKE 1 CAPSULE BY MOUTH TWICE DAILY  60 capsule  5  .  potassium chloride (KLOR-CON 10) 10 MEQ tablet Take 3 tablets (30 mEq total) by mouth daily.  90 tablet  3  . PROAIR HFA 108 (90 BASE) MCG/ACT inhaler INHALE 2 PUFFS INTO THE LUNGS FOUR TIMES DAILY AS NEEDED  1 Inhaler  0  . sertraline (ZOLOFT) 100 MG tablet Take 1 tablet (100 mg total) by mouth 2 (two) times daily.  60 tablet  5  . SPIRIVA HANDIHALER 18 MCG inhalation capsule INHALE CONTENTS OF 1 CAPSULE ONCE A DAY  30 capsule  0  . iron polysaccharides (POLY-IRON 150) 150 MG capsule Take 1 capsule twice a day with meals.  60 capsule  3  . triamcinolone cream (KENALOG) 0.1 % Apply topically 2 (two) times daily. Apply to affected area of hand for 2 weeks.  30 g  0  . albuterol (PROAIR HFA) 108 (90 BASE) MCG/ACT inhaler Inhale 2 puffs into the lungs 4 (four) times daily as needed.  1 Inhaler  6     Review of Systems  Constitutional:   No  weight loss, night sweats,  Fevers, chills, fatigue, lassitude. HEENT:   No headaches,  Difficulty swallowing,  Tooth/dental problems,  Sore throat,                No sneezing, itching, ear ache, nasal congestion, post nasal drip,   CV:  No chest pain,  Orthopnea, PND, swelling in lower extremities, anasarca, dizziness, palpitations  GI  No heartburn, indigestion, abdominal pain, nausea, vomiting, diarrhea, change in bowel habits, loss of appetite  Resp: Notes shortness of breath with exertion not  at rest.  No excess mucus, no productive cough,  No non-productive cough,  No coughing up of blood.  No change in color of mucus.  No wheezing.  No chest wall deformity  Skin: no rash or lesions.  GU: no dysuria, change in color of urine, no urgency or  frequency.  No flank pain.  MS:  No joint pain or swelling.  No decreased range of motion.  No back pain.  Psych:  No change in mood or affect. No depression or anxiety.  No memory loss.     Objective:   Physical Exam  Filed Vitals:   05/02/12 1130  BP: 122/80  Pulse: 92  Temp: 98.3 F (36.8 C)  TempSrc: Oral  Height: 5\' 3"  (1.6 m)  Weight: 232 lb 3.2 oz (105.325 kg)  SpO2: 93%    Gen: Pleasant, well-nourished, in no distress,  normal affect  ENT: No lesions,  mouth clear,  oropharynx clear, no postnasal drip  Neck: No JVD, no TMG, no carotid bruits  Lungs: No use of accessory muscles, no dullness to percussion, distant BS  Cardiovascular: RRR, heart sounds normal, no murmur or gallops, no peripheral edema  Abdomen: soft and NT, no HSM,  BS normal  Musculoskeletal: No deformities, no cyanosis or clubbing  Neuro: alert, non focal  Skin: Warm, no lesions or rashes        Assessment & Plan:   No problem-specific assessment & plan notes found for this encounter.   Updated Medication List Outpatient Encounter Prescriptions as of 05/02/2012  Medication Sig Dispense Refill  . ADVAIR DISKUS 250-50 MCG/DOSE AEPB INHALE 1 PUFF BY MOUTH TWICE DAILY  60 each  0  . ALPRAZolam (XANAX) 0.5 MG tablet Take 1 to 1 and 1/2 tablets by mouth three times a day as needed for anxiety.  120 tablet  3  . amitriptyline (ELAVIL) 150 MG tablet Take 1 tablet (  150 mg total) by mouth daily.  31 tablet  6  . aspirin 81 MG EC tablet Take 81 mg by mouth daily.        . Calcium Carbonate-Vitamin D (CALCIUM 600+D) 600-400 MG-UNIT per tablet Take 1 tablet by mouth 2 (two) times daily.  60 tablet  11  . carisoprodol (SOMA) 350 MG tablet Take 1 tablet (350 mg total) by mouth 4 (four) times daily as needed for muscle spasms.  120 tablet  5  . fluticasone (FLONASE) 50 MCG/ACT nasal spray INHALE 2 SPRAYS IN EACH NOSTRIL DAILY  16 g  6  . guaiFENesin (MUCINEX) 600 MG 12 hr tablet Take 600 mg by mouth 2  (two) times daily.        . hydrochlorothiazide (HYDRODIURIL) 25 MG tablet TAKE ONE TABLET BY MOUTH DAILY  31 tablet  6  . HYDROcodone-acetaminophen (VICODIN ES) 7.5-750 MG per tablet Take 1 tablet by mouth 4 (four) times daily as needed for pain.  120 tablet  3  . ibuprofen (ADVIL,MOTRIN) 200 MG tablet Take 400 mg by mouth 2 (two) times daily as needed. For pain        . iron polysaccharides (NIFEREX) 150 MG capsule Take 1 capsule once daily with meals.      Marland Kitchen LIDODERM 5 % PLACE ONE PATCH ONTO THE SKIN DAILY , REMOVE AND DISCARD PATCH WITHIN 12 HOURS AS DIRECTED  30 each  3  . meclizine (ANTIVERT) 12.5 MG tablet Take 1 tablet (12.5 mg total) by mouth 2 (two) times daily as needed for dizziness.  60 tablet  1  . NIFEdipine (PROCARDIA XL/ADALAT-CC) 90 MG 24 hr tablet Take 1 tablet (90 mg total) by mouth daily.  90 tablet  3  . omeprazole (PRILOSEC) 20 MG capsule TAKE 1 CAPSULE BY MOUTH TWICE DAILY  60 capsule  5  . potassium chloride (KLOR-CON 10) 10 MEQ tablet Take 3 tablets (30 mEq total) by mouth daily.  90 tablet  3  . PROAIR HFA 108 (90 BASE) MCG/ACT inhaler INHALE 2 PUFFS INTO THE LUNGS FOUR TIMES DAILY AS NEEDED  1 Inhaler  0  . sertraline (ZOLOFT) 100 MG tablet Take 1 tablet (100 mg total) by mouth 2 (two) times daily.  60 tablet  5  . SPIRIVA HANDIHALER 18 MCG inhalation capsule INHALE CONTENTS OF 1 CAPSULE ONCE A DAY  30 capsule  0  . triamcinolone cream (KENALOG) 0.1 % Apply topically 2 (two) times daily. Apply to affected area of hand as needed.      Marland Kitchen DISCONTD: iron polysaccharides (POLY-IRON 150) 150 MG capsule Take 1 capsule twice a day with meals.  60 capsule  3  . DISCONTD: triamcinolone cream (KENALOG) 0.1 % Apply topically 2 (two) times daily. Apply to affected area of hand for 2 weeks.  30 g  0  . DISCONTD: albuterol (PROAIR HFA) 108 (90 BASE) MCG/ACT inhaler Inhale 2 puffs into the lungs 4 (four) times daily as needed.  1 Inhaler  6

## 2012-05-02 NOTE — Assessment & Plan Note (Signed)
COPD golds C off oxygen  Plan No change in inhaled or maintenance medications. Return in  6 months or prn

## 2012-05-04 ENCOUNTER — Other Ambulatory Visit: Payer: Self-pay | Admitting: Critical Care Medicine

## 2012-05-07 ENCOUNTER — Other Ambulatory Visit: Payer: Self-pay | Admitting: Internal Medicine

## 2012-05-08 NOTE — Telephone Encounter (Signed)
Refill approved - nurse to complete. 

## 2012-05-09 NOTE — Telephone Encounter (Signed)
Rx called in to pharmacy - pt aware. 

## 2012-06-02 ENCOUNTER — Other Ambulatory Visit: Payer: Self-pay | Admitting: Internal Medicine

## 2012-06-02 ENCOUNTER — Other Ambulatory Visit: Payer: Self-pay | Admitting: Critical Care Medicine

## 2012-06-21 ENCOUNTER — Ambulatory Visit (INDEPENDENT_AMBULATORY_CARE_PROVIDER_SITE_OTHER): Payer: Medicare HMO | Admitting: Internal Medicine

## 2012-06-21 ENCOUNTER — Encounter: Payer: Self-pay | Admitting: Internal Medicine

## 2012-06-21 VITALS — BP 131/86 | HR 87 | Temp 98.0°F | Ht 63.0 in | Wt 235.0 lb

## 2012-06-21 DIAGNOSIS — G9389 Other specified disorders of brain: Secondary | ICD-10-CM

## 2012-06-21 DIAGNOSIS — L309 Dermatitis, unspecified: Secondary | ICD-10-CM

## 2012-06-21 DIAGNOSIS — I10 Essential (primary) hypertension: Secondary | ICD-10-CM

## 2012-06-21 DIAGNOSIS — Z79899 Other long term (current) drug therapy: Secondary | ICD-10-CM

## 2012-06-21 DIAGNOSIS — Z1231 Encounter for screening mammogram for malignant neoplasm of breast: Secondary | ICD-10-CM

## 2012-06-21 DIAGNOSIS — M797 Fibromyalgia: Secondary | ICD-10-CM

## 2012-06-21 DIAGNOSIS — L259 Unspecified contact dermatitis, unspecified cause: Secondary | ICD-10-CM

## 2012-06-21 DIAGNOSIS — R32 Unspecified urinary incontinence: Secondary | ICD-10-CM

## 2012-06-21 DIAGNOSIS — IMO0001 Reserved for inherently not codable concepts without codable children: Secondary | ICD-10-CM

## 2012-06-21 LAB — BASIC METABOLIC PANEL WITH GFR
Calcium: 9.4 mg/dL (ref 8.4–10.5)
GFR, Est African American: >89 mL/min
Sodium: 136 mEq/L (ref 135–145)

## 2012-06-21 MED ORDER — TRIAMCINOLONE ACETONIDE 0.1 % EX CREA: TOPICAL_CREAM | Freq: Two times a day (BID) | CUTANEOUS | Status: DC

## 2012-06-21 NOTE — Assessment & Plan Note (Signed)
Assessment: Patient has chronic urge incontinence which requires the use of multiple protective pads daily.  Plan: Patient has urology appointment tomorrow.

## 2012-06-21 NOTE — Patient Instructions (Signed)
A referral has been made to neurology. Please keep appointment with urology as scheduled. Please keep appointment for screening mammogram.

## 2012-06-21 NOTE — Assessment & Plan Note (Signed)
Assessment: Patient's hand eczema, which affects the palm of the right hand, resolved following treatment with topical triamcinolone cream but then recurred.  Plan: Treat with 0.1% triamcinolone cream topical twice a day for 2 weeks.

## 2012-06-21 NOTE — Assessment & Plan Note (Signed)
Assessment: An MRI of the brain on 03/09/2012 showed moderate ventricular enlargement out of proportion to the degree of cortical atrophy, most evident when compared with 2008, with a comment that normal  pressure hydrocephalus is not excluded.  Patient has urge incontinence, and in the past reported occasional tripping when walking but does not seem to have typical gait problems suggestive of NPH; she has no apparent cognitive deficits by history.  Patient recently saw Dr. Corliss Skains for follow-up of her previously treated cerebral aneurysm, and he recommended neurology referral to assess her ventricular enlargement.  Plan: Neurology referral to assess moderate ventricular enlargement seen on MRI of the brain.

## 2012-06-21 NOTE — Assessment & Plan Note (Signed)
Lab Results  Component Value Date   NA 133* 02/16/2012   K 3.7 02/16/2012   CL 96 02/16/2012   CO2 28 02/16/2012   BUN 8 02/16/2012   CREATININE 0.55 03/21/2012   CREATININE 0.61 02/16/2012    BP Readings from Last 3 Encounters:  06/21/12 131/86  05/02/12 122/80  04/19/12 138/90    Assessment: Hypertension control:  controlled  Progress toward goals:  at goal Barriers to meeting goals:  no barriers identified  Plan: Hypertension treatment:  continue current medications; given mild hyponatremia on prior basic metabolic panel, which may be due to hydrochlorothiazide, will check a basic metabolic panel today.

## 2012-06-21 NOTE — Progress Notes (Signed)
Subjective:    Patient ID: Jennifer [name], female    DOB: 1941-07-24, 71 y.o.   MRN: [id number]  HPI Patient returns for followup of her hypertension, hand eczema, fibromyalgia, and other chronic medical problems.  She reports that her hand eczema improved with topical triamcinolone cream, but then recurred on the palm once she stopped the cream; she requested a refill today.  Her respiratory status is stable, and she recently saw her pulmonologist Dr. Delford Field; she has had no increased shortness of breath or other respiratory symptoms.  She reports that she is compliant with her CPAP at night.  She has occasional mild ankle edema.  Her chronic pain symptoms appear to be well controlled on her current regimen.  She has occasional headaches which are relieved by her current dose of hydrocodone/acetaminophen.  She has an appointment tomorrow for evaluation of her incontinence by a urologist.  She did not bring her medications to clinic today, but confirmed her medications from her medication list.   Medication Sig  . ALPRAZolam (XANAX) 0.5 MG tablet Take 1 to 1 and 1/2 tablets by mouth three times a day as needed for anxiety.  Marland Kitchen amitriptyline (ELAVIL) 150 MG tablet Take 1 tablet (150 mg total) by mouth daily.  Marland Kitchen aspirin 81 MG EC tablet Take 81 mg by mouth daily.    . Calcium Carbonate-Vitamin D (CALCIUM 600+D) 600-400 MG-UNIT per tablet Take 1 tablet by mouth 2 (two) times daily.  . carisoprodol (SOMA) 350 MG tablet TAKE 1 TABLET BY MOUTH FOUR TIMES DAILY AS NEEDED FOR MUSCLE SPASM  . fluticasone (FLONASE) 50 MCG/ACT nasal spray TAKE 2 SPRAYS INTO EACH NOSTRIL DAILY  . Fluticasone-Salmeterol (ADVAIR DISKUS) 250-50 MCG/DOSE AEPB Inhale 1 puff into the lungs 2 (two) times daily.  Marland Kitchen guaiFENesin (MUCINEX) 600 MG 12 hr tablet Take 600 mg by mouth 2 (two) times daily.    . hydrochlorothiazide (HYDRODIURIL) 25 MG tablet TAKE ONE TABLET BY MOUTH DAILY  . HYDROcodone-acetaminophen (VICODIN ES) 7.5-750 MG per  tablet Take 1 tablet by mouth 4 (four) times daily as needed for pain.  Marland Kitchen ibuprofen (ADVIL,MOTRIN) 200 MG tablet Take 400 mg by mouth 2 (two) times daily as needed. For pain    . iron polysaccharides (NIFEREX) 150 MG capsule Take 1 capsule once daily with meals.  Marland Kitchen KLOR-CON 10 10 MEQ tablet TAKE 3 TABLETS BY MOUTH DAILY  . LIDODERM 5 % PLACE ONE PATCH ONTO THE SKIN DAILY , REMOVE AND DISCARD PATCH WITHIN 12 HOURS AS DIRECTED  . meclizine (ANTIVERT) 12.5 MG tablet TAKE 1 TABLET BY MOUTH TWICE DAILY AS NEEDED FOR DIZZINESS  . NIFEdipine (PROCARDIA XL/ADALAT-CC) 90 MG 24 hr tablet Take 1 tablet (90 mg total) by mouth daily.  Marland Kitchen omeprazole (PRILOSEC) 20 MG capsule TAKE 1 CAPSULE BY MOUTH TWICE DAILY  . PROAIR HFA 108 (90 BASE) MCG/ACT inhaler INHALE 2 PUFFS INTO THE LUNGS FOUR TIMES DAILY AS NEEDED  . sertraline (ZOLOFT) 100 MG tablet Take 1 tablet (100 mg total) by mouth 2 (two) times daily.  Marland Kitchen tiotropium (SPIRIVA HANDIHALER) 18 MCG inhalation capsule Place 1 capsule (18 mcg total) into inhaler and inhale daily.  Marland Kitchen triamcinolone cream (KENALOG) 0.1 % Apply topically 2 (two) times daily. Apply to affected area of hand as needed.    Allergies as of 06/21/2012 - Review Complete 06/21/2012  Allergen Reaction Noted  . Lisinopril Other (See Comments) 01/06/2011  . Tetracycline  12/29/2006   Past Medical History  Diagnosis Date  . COPD (  chronic obstructive pulmonary disease)     Followed by Dr. Delford Field  . Fibromyalgia   . Hypertension   . OSA (obstructive sleep apnea)     Sleep study done April 02, 2008 showed severe obstructive sleep apnea.  . Diastolic dysfunction     Grade I diastolic dysfunction as shown on ECHO Dec 2011.  EF of 65-70%.  Marland Kitchen COPD (chronic obstructive pulmonary disease)     Followed by Dr. Delford Field  . Dyslipidemia   . Osteopenia     By DXA scan 10/23/2009.  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  Corliss Skains on 11/10/2004.  MRA of the head on 03/09/2012 showed no evidence for recanalization.  . Osteoarthrosis involving multiple sites   . Chronic back pain   . Depression   . Anxiety   . Anemia   . GERD (gastroesophageal reflux disease)   . Meniere disease     Followed by ENT Dr. Lucky Cowboy  . Diverticulosis of colon   . Multiple pigmented nevi   . Hypokalemia   . S/P hysterectomy 1975  . Pneumonia 11/2004  . Knee pain   . Dysphagia   . Urosepsis 11/04/2010    Klebsiella pneumoniae  . Breast pain   . Allergic rhinitis   . Fibromyalgia   . 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     . Urinary incontinence      Past Surgical History  Procedure Date  . Abdominal hysterectomy 1975  . 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 Corliss Skains on 11/10/2004.    Review of Systems See history of present illness.     Objective:   Physical Exam  Constitutional: No distress.  Cardiovascular: Normal rate, regular rhythm and normal heart sounds.  Exam reveals no gallop and no friction rub.   No murmur heard. Pulmonary/Chest: Effort normal and breath sounds normal. No respiratory distress. She has no wheezes. She has no rales.  Musculoskeletal: She exhibits no edema.  Skin:             Assessment & Plan:

## 2012-06-21 NOTE — Assessment & Plan Note (Signed)
Assessment: Patient symptoms are reasonably well controlled on current regimen.  Her insurance apparently will not continue to pay for Soma, so an alternative medication if available on formulary may be needed.  Plan: Continue current regimen.

## 2012-07-04 ENCOUNTER — Other Ambulatory Visit: Payer: Self-pay | Admitting: Internal Medicine

## 2012-07-14 ENCOUNTER — Ambulatory Visit (HOSPITAL_COMMUNITY): Payer: Medicare HMO

## 2012-08-01 ENCOUNTER — Other Ambulatory Visit: Payer: Self-pay | Admitting: Internal Medicine

## 2012-08-01 ENCOUNTER — Ambulatory Visit (HOSPITAL_COMMUNITY): Payer: Medicare HMO

## 2012-08-01 NOTE — Telephone Encounter (Signed)
Refills approved; nurse to call in refill for alprazolam and hydrocodone/acetaminophen.

## 2012-08-12 ENCOUNTER — Emergency Department (HOSPITAL_COMMUNITY)
Admission: EM | Admit: 2012-08-12 | Discharge: 2012-08-13 | Disposition: A | Discharge status: Home / Self Care | Payer: Medicare HMO | Attending: Emergency Medicine | Admitting: Emergency Medicine

## 2012-08-12 ENCOUNTER — Encounter (HOSPITAL_COMMUNITY): Payer: Self-pay | Admitting: Emergency Medicine

## 2012-08-12 ENCOUNTER — Emergency Department (HOSPITAL_COMMUNITY): Payer: Medicare HMO

## 2012-08-12 DIAGNOSIS — R42 Dizziness and giddiness: Secondary | ICD-10-CM | POA: Diagnosis present

## 2012-08-12 DIAGNOSIS — I10 Essential (primary) hypertension: Secondary | ICD-10-CM | POA: Insufficient documentation

## 2012-08-12 DIAGNOSIS — R112 Nausea with vomiting, unspecified: Secondary | ICD-10-CM | POA: Insufficient documentation

## 2012-08-12 DIAGNOSIS — J4489 Other specified chronic obstructive pulmonary disease: Secondary | ICD-10-CM | POA: Insufficient documentation

## 2012-08-12 DIAGNOSIS — R51 Headache: Secondary | ICD-10-CM | POA: Insufficient documentation

## 2012-08-12 DIAGNOSIS — R1110 Vomiting, unspecified: Secondary | ICD-10-CM | POA: Diagnosis present

## 2012-08-12 DIAGNOSIS — Z79899 Other long term (current) drug therapy: Secondary | ICD-10-CM | POA: Insufficient documentation

## 2012-08-12 DIAGNOSIS — J449 Chronic obstructive pulmonary disease, unspecified: Secondary | ICD-10-CM | POA: Insufficient documentation

## 2012-08-12 LAB — GLUCOSE, CAPILLARY: Glucose-Capillary: 158 mg/dL — ABNORMAL HIGH (ref 70–99)

## 2012-08-12 MED ORDER — ONDANSETRON HCL 4 MG/2ML IJ SOLN
4.0000 mg | Freq: Once | INTRAMUSCULAR | Status: AC
  Administered 2012-08-12: 4 mg via INTRAVENOUS
  Filled 2012-08-12: qty 2

## 2012-08-12 MED ORDER — ONDANSETRON HCL 4 MG/2ML IJ SOLN
4.0000 mg | Freq: Once | INTRAMUSCULAR | Status: AC
  Administered 2012-08-12: 4 mg via INTRAVENOUS

## 2012-08-12 MED ORDER — DEXAMETHASONE SODIUM PHOSPHATE 10 MG/ML IJ SOLN
10.0000 mg | Freq: Once | INTRAMUSCULAR | Status: AC
  Administered 2012-08-12: 10 mg via INTRAVENOUS
  Filled 2012-08-12: qty 1

## 2012-08-12 MED ORDER — DIAZEPAM 5 MG/ML IJ SOLN
2.5000 mg | Freq: Once | INTRAMUSCULAR | Status: AC
  Administered 2012-08-13: 2.5 mg via INTRAVENOUS
  Filled 2012-08-12: qty 2

## 2012-08-12 MED ORDER — METOCLOPRAMIDE HCL 5 MG/ML IJ SOLN
10.0000 mg | Freq: Once | INTRAMUSCULAR | Status: AC
  Administered 2012-08-12: 10 mg via INTRAVENOUS
  Filled 2012-08-12: qty 2

## 2012-08-12 MED ORDER — DIPHENHYDRAMINE HCL 50 MG/ML IJ SOLN
25.0000 mg | Freq: Once | INTRAMUSCULAR | Status: AC
  Administered 2012-08-12: 25 mg via INTRAVENOUS
  Filled 2012-08-12: qty 1

## 2012-08-12 MED ORDER — ONDANSETRON HCL 4 MG/2ML IJ SOLN
INTRAMUSCULAR | Status: AC
  Filled 2012-08-12: qty 2

## 2012-08-12 MED ORDER — SODIUM CHLORIDE 0.9 % IV BOLUS (SEPSIS)
500.0000 mL | INTRAVENOUS | Status: AC
  Administered 2012-08-12: 500 mL via INTRAVENOUS

## 2012-08-12 MED ORDER — DIAZEPAM 5 MG/ML IJ SOLN
2.5000 mg | Freq: Once | INTRAMUSCULAR | Status: AC
  Administered 2012-08-12: 2.5 mg via INTRAVENOUS
  Filled 2012-08-12: qty 2

## 2012-08-12 NOTE — ED Provider Notes (Signed)
History     CSN: [id number]  Arrival date & time 08/12/12  1933   First MD Initiated Contact with Patient 08/12/12 2011      Chief Complaint  Patient presents with  . Emesis    (Consider location/radiation/quality/duration/timing/severity/associated sxs/prior treatment) Patient is a 71 y.o. female presenting with neurologic complaint. The history is provided by the patient.  Neurologic Problem The primary symptoms include dizziness, nausea and vomiting. Primary symptoms do not include headaches or fever. The symptoms began less than 1 hour ago. The symptoms are improving. The neurological symptoms are diffuse. Context: spont while sitting on couch.  Dizziness also occurs with nausea and vomiting.  Additional symptoms include vertigo.    Past Medical History  Diagnosis Date  . COPD (chronic obstructive pulmonary disease)     O2 dependent. Followed by Dr. Delford Field  . Fibromyalgia   . Hypertension   . OSA (obstructive sleep apnea)     Sleep study done April 02, 2008 showed severe obstructive sleep apnea.  . Diastolic dysfunction     Grade I diastolic dysfunction as shown on ECHO Dec 2011.  EF of 65-70%.  Marland Kitchen COPD (chronic obstructive pulmonary disease)     Followed by Dr. Delford Field  . Dyslipidemia   . Osteopenia     By DXA scan 10/23/2009.  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 Corliss Skains on 11/10/2004.  MRA of the head on 03/09/2012 showed no evidence for recanalization.  . Osteoarthrosis involving multiple sites   . Chronic back pain   . Depression   . Anxiety   . Anemia   . GERD (gastroesophageal reflux disease)   . Meniere disease     Followed by ENT Dr. Lucky Cowboy  . Diverticulosis of colon   . Multiple pigmented nevi   . Hypokalemia   . S/P hysterectomy 1975  . Pneumonia 11/2004  . Knee pain   . Dysphagia   . Urosepsis 11/04/2010    Klebsiella pneumoniae  . Breast pain   . Allergic rhinitis    . Fibromyalgia   . 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     . Urinary incontinence     Past Surgical History  Procedure Date  . Abdominal hysterectomy 1975  . 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 Corliss Skains on 11/10/2004.    Family History  Problem Relation Age of Onset  . Ulcers Mother   . Ovarian cancer Sister 45  . Cervical cancer Daughter   . Cervical cancer Daughter   . Lung cancer Neg Hx   . Colon cancer Neg Hx   . Heart attack Father   . Cerebral aneurysm Sister 25    History  Substance Use Topics  . Smoking status: Former Smoker -- 0.5 packs/day for 30 years    Types: Cigarettes    Quit date: 12/07/2008  . Smokeless tobacco: Former Neurosurgeon    Types: Chew    Quit date: 12/06/1978  . Alcohol Use: No    OB History    Grav Para Term Preterm Abortions TAB SAB Ect Mult Living  Review of Systems  Constitutional: Negative for fever and fatigue.  HENT: Negative for congestion, drooling and neck pain.   Eyes: Negative for pain.  Respiratory: Negative for cough and shortness of breath.   Cardiovascular: Negative for chest pain.  Gastrointestinal: Positive for nausea and vomiting. Negative for abdominal pain and diarrhea.  Genitourinary: Negative for dysuria and hematuria.  Musculoskeletal: Negative for back pain and gait problem.  Skin: Negative for color change.  Neurological: Positive for dizziness and vertigo. Negative for headaches.  Hematological: Negative for adenopathy.  Psychiatric/Behavioral: Negative for behavioral problems.  All other systems reviewed and are negative.    Allergies  Flonase; Lisinopril; and  Tetracycline  Home Medications   Current Outpatient Rx  Name Route Sig Dispense Refill  . ALPRAZOLAM 0.5 MG PO TABS  Take 1 to 1 and 1/2 tablets by mouth 3 times a day as needed for anxiety 120 tablet 3  . AMITRIPTYLINE HCL 150 MG PO TABS  TAKE 1 TABLET BY MOUTH DAILY 31 tablet 5  . ASPIRIN 81 MG PO TBEC Oral Take 81 mg by mouth daily.      Marland Kitchen CALCIUM CARBONATE-VITAMIN D 600-400 MG-UNIT PO TABS Oral Take 1 tablet by mouth 2 (two) times daily. 60 tablet 11  . CARISOPRODOL 350 MG PO TABS  TAKE 1 TABLET BY MOUTH FOUR TIMES DAILY AS NEEDED FOR MUSCLE SPASM 120 tablet 3  . FLUTICASONE-SALMETEROL 250-50 MCG/DOSE IN AEPB Inhalation Inhale 1 puff into the lungs 2 (two) times daily. 60 each 11  . GUAIFENESIN ER 600 MG PO TB12 Oral Take 600 mg by mouth 2 (two) times daily.      Marland Kitchen HYDROCHLOROTHIAZIDE 25 MG PO TABS  TAKE ONE TABLET BY MOUTH DAILY 31 tablet 6  . HYDROCODONE-ACETAMINOPHEN 7.5-750 MG PO TABS  TAKE 1 TABLET BY MOUTH FOUR TIMES DAILY AS NEEDED FOR PAIN 120 tablet 3  . HYPROMELLOSE 2.5 % OP SOLN Both Eyes Place 2 drops into both eyes 3 (three) times daily as needed. Dry eye    . IBUPROFEN 200 MG PO TABS Oral Take 400 mg by mouth 2 (two) times daily as needed. For pain      . POLYSACCHARIDE IRON COMPLEX 150 MG PO CAPS  Take 1 capsule once daily with meals.    Marland Kitchen KLOR-CON 10 10 MEQ PO TBCR  TAKE 3 TABLETS BY MOUTH EVERY DAY 90 tablet 2  . LIDODERM 5 % EX PTCH  PLACE ONE PATCH ONTO THE SKIN DAILY , REMOVE AND DISCARD PATCH WITHIN 12 HOURS AS DIRECTED 30 each 3  . MECLIZINE HCL 12.5 MG PO TABS  TAKE 1 TABLET BY MOUTH TWICE DAILY AS NEEDED FOR DIZZINESS 60 tablet 2  . NIFEDIPINE ER OSMOTIC 90 MG PO TB24 Oral Take 1 tablet (90 mg total) by mouth daily. 90 tablet 3  . OMEPRAZOLE 20 MG PO CPDR  TAKE 1 CAPSULE BY MOUTH TWICE DAILY 60 capsule 5  . PROAIR HFA 108 (90 BASE) MCG/ACT IN AERS  INHALE 2 PUFFS INTO THE LUNGS FOUR TIMES DAILY AS NEEDED 1 Inhaler 5  . SERTRALINE HCL 100 MG PO TABS  TAKE 1 TABLET  BY MOUTH TWICE DAILY 60 tablet 3  . TIOTROPIUM BROMIDE MONOHYDRATE 18 MCG IN CAPS Inhalation Place 1 capsule (18 mcg total) into inhaler and inhale daily. 30 capsule 11    Please keep appt for additional refills  . TRIAMCINOLONE ACETONIDE 0.1 % EX CREA  APPLY TOPICALLY TO THE AFFECTED AREA OF HAND TWICE DAILY AS NEEDED  30 g 0  . ONDANSETRON 4 MG PO TBDP  4mg  ODT q4 hours prn nausea/vomit 15 tablet 0    BP 141/66  Pulse 80  Temp 98 F (36.7 C) (Oral)  Resp 16  SpO2 97%  Physical Exam  Nursing note and vitals reviewed. Constitutional: She is oriented to person, place, and time. She appears well-developed and well-nourished.  HENT:  Head: Normocephalic.  Mouth/Throat: No oropharyngeal exudate.  Eyes: Conjunctivae and EOM are normal. Pupils are equal, round, and reactive to light.  Neck: Normal range of motion. Neck supple.  Cardiovascular: Normal rate, regular rhythm, normal heart sounds and intact distal pulses.  Exam reveals no gallop and no friction rub.   No murmur heard. Pulmonary/Chest: Effort normal and breath sounds normal. No respiratory distress. She has no wheezes.  Abdominal: Soft. Bowel sounds are normal. There is no tenderness. There is no rebound and no guarding.  Musculoskeletal: Normal range of motion. She exhibits no edema and no tenderness.  Neurological: She is alert and oriented to person, place, and time. She has normal strength. No cranial nerve deficit or sensory deficit. Coordination normal.       Worsening dizziness w/ movment of head. No nystagmus noted w/ dix halpike, ,but worsening of sx.   Skin: Skin is warm and dry.  Psychiatric: She has a normal mood and affect. Her behavior is normal.    ED Course  Procedures (including critical care time)  Labs Reviewed  GLUCOSE, CAPILLARY - Abnormal; Notable for the following:    Glucose-Capillary 158 (*)     All other components within normal limits  LAB REPORT - SCANNED   Dg Abd Portable 1v  08/12/2012   *RADIOLOGY REPORT*  Clinical Data: Nausea and vomiting.  Vertigo.  PORTABLE ABDOMEN - 1 VIEW  Comparison: None.  Findings: Gas and stool throughout the colon.  No small or large bowel distension.  Calcifications in the pelvis are likely phleboliths.  No radiopaque stones.  Degenerative changes in the spine and hips.  IMPRESSION: Nonobstructive bowel gas pattern.   Original Report Authenticated By: Marlon Pel, M.D.      1. Vertigo   2. Vomiting       MDM  11:37 AM 71 y.o. female w hx of meniere dis, fibromyalgia, chf, copd pw sudden onset dizziness cw previous meniere's exacerbations while sitting on her couch approx 1 hour ago. Pt had some mild vomiting w/ sx. Pt denies recent illness, otherwise well. AFVSS here, appears well on exam, denies pain. No focal neuro deficits.   Pt now states she has mild right sided HA. Will give migraine cocktail.   11:37 AM: Pt feeling better on exam, suspect her sx to be d/t meniere's disease.  I have discussed the diagnosis/risks/treatment options with the patient and family and believe the pt to be eligible for discharge home to follow-up with pcp in 1-2 days if no better. We also discussed returning to the ED immediately if new or worsening sx occur. We discussed the sx which are most concerning (e.g., worsening dizziness, fever, inability to ambulate) that necessitate immediate return. Any new prescriptions provided to the patient are listed below.  New Prescriptions   ONDANSETRON (ZOFRAN ODT) 4 MG DISINTEGRATING TABLET    4mg  ODT q4 hours prn nausea/vomit    Clinical Impression 1. Vertigo   2. Vomiting        Purvis Sheffield, MD 08/13/12 1137

## 2012-08-12 NOTE — ED Notes (Signed)
Pt has been actively vomiting today; hx of Meniers and vertigo; she becomes especially nauseated when she moves. Pt also presents with neck growth enlargement; undiagnosed at this time.

## 2012-08-12 NOTE — ED Notes (Signed)
Patient placed on the bedpan.  Moves well in bed  Urine obtained.

## 2012-08-13 MED ORDER — ONDANSETRON 4 MG PO TBDP: ORAL_TABLET | ORAL | Status: AC

## 2012-08-13 NOTE — Progress Notes (Signed)
Valium 2.5 mg IV administered. We will continue to monitor.

## 2012-08-14 NOTE — ED Provider Notes (Signed)
I saw and evaluated the patient, reviewed the resident's note and I agree with the findings and plan.  I reviewed and agree with ECG interpretation by resident physician.  Pt with h/o vertigo, had similar symptoms.  Took meds at home, no sig improvement.  Here, received additional meds, feels much improved.  No sig abd pain, has sig abd bloating and hyperactive BS, will get plain films.  VS are ok, if neg, will dc home.    Gavin Pound. Oletta Lamas, MD 08/14/12 [phone]

## 2012-08-17 ENCOUNTER — Other Ambulatory Visit: Payer: Self-pay | Admitting: Neurology

## 2012-08-17 DIAGNOSIS — G919 Hydrocephalus, unspecified: Secondary | ICD-10-CM

## 2012-08-22 ENCOUNTER — Ambulatory Visit (INDEPENDENT_AMBULATORY_CARE_PROVIDER_SITE_OTHER): Payer: Medicare HMO | Admitting: Internal Medicine

## 2012-08-22 ENCOUNTER — Encounter: Payer: Self-pay | Admitting: Internal Medicine

## 2012-08-22 VITALS — BP 135/87 | HR 85 | Temp 98.0°F | Ht 63.0 in | Wt 238.4 lb

## 2012-08-22 DIAGNOSIS — I671 Cerebral aneurysm, nonruptured: Secondary | ICD-10-CM

## 2012-08-22 DIAGNOSIS — G9389 Other specified disorders of brain: Secondary | ICD-10-CM

## 2012-08-22 DIAGNOSIS — G039 Meningitis, unspecified: Secondary | ICD-10-CM

## 2012-08-22 DIAGNOSIS — R05 Cough: Secondary | ICD-10-CM

## 2012-08-22 DIAGNOSIS — Z23 Encounter for immunization: Secondary | ICD-10-CM

## 2012-08-22 DIAGNOSIS — R059 Cough, unspecified: Secondary | ICD-10-CM | POA: Insufficient documentation

## 2012-08-22 DIAGNOSIS — L723 Sebaceous cyst: Secondary | ICD-10-CM | POA: Insufficient documentation

## 2012-08-22 DIAGNOSIS — H8109 Meniere's disease, unspecified ear: Secondary | ICD-10-CM

## 2012-08-22 MED ORDER — BENZONATATE 100 MG PO CAPS: 100.0000 mg | ORAL_CAPSULE | Freq: Three times a day (TID) | ORAL | Status: DC | PRN

## 2012-08-22 NOTE — Assessment & Plan Note (Signed)
Here for f/u of recent hospital visit on 08/13/12 after going to ED for symptoms of vomiting and dizziness x1 day duration along with headache.  Thought to be secondary to Meniere's disease.  Given Zofran, Valium in ED. Symptoms have not returned since discharge.  -Continue Zofran and Meclizine PRN -walking with assistance -f/u neurology as needed -continue to monitor

## 2012-08-22 NOTE — Assessment & Plan Note (Signed)
Continue to follow up with neurology as scheduled.  Daughter claims she is scheduled for follow up with neurology this week and will have repeat MRI scanning.    -continue ASA -f/u neurology -continue to monitor

## 2012-08-22 NOTE — Progress Notes (Signed)
Subjective:   Patient ID: REVONDA MENTER female   DOB: 1941/05/22 71 y.o.   MRN: [id number]  HPI: Ms.Annick D Pinder is a 71 y.o. white elderly female with extensive past medical history significant for COPD, cerebral aneurysm, ventriculomegaly, fibromyalgia, osteopenia, diastolic heart failure, and depression, hypertension, Mnire's disease, and fibromyalgia presented to clinic today for hospital followup. Ms. mcglinn is accompanied by her daughter who is also her primary caretaker. Ms. jambor was apparently seen in the emergency room on 08/12/2012 for complaints of dizziness and vomiting x1 day thought to be most likely secondary to history of Mnire's disease. She was given Zofran and Valium in the emergency room and noted improvement. She has had resolution of symptoms since discharge. She denies any current dizziness, nausea, vomiting, tinnitus, headaches, chest pain, shortness of breath, vision disturbances, lightheadedness, or abdominal pain at this time. She has an appointment to followup with her neurologist this week in regards to ventriculomegaly.  Ms. Lenderman is also noted to have a 2-3 cm sebaceous cyst on her chin for the past 2 weeks slowly growing over time. The cyst is tender to touch,oozing pus, and the daughter has been covering it with a Band-Aid and putting Neosporin on the affected area. It is noted to have a white center, and a white thick material was squeezed out of cyst. It will likely need to be removed after holding aspirin for approximately 7 days.    Her only other complaint at this time is a dry cough x2 weeks. She thinks it is likely due to allergies.  She has tried over-the-counter cough medicine intestines however to no relief.  She claims to have occasional productive sputum white in color but very rare. She denies smoking however her primary caretaker which is her daughter smokes every day. She does admit to having seasonal allergies both indoor and outdoor.  She will be  given a flu vaccine in clinic today and a prescription for shingles vaccine and is scheduled for mammogram this Friday.  Past Medical History  Diagnosis Date  . COPD (chronic obstructive pulmonary disease)     O2 dependent. Followed by Dr. Delford Field  . Fibromyalgia   . Hypertension   . OSA (obstructive sleep apnea)     Sleep study done April 02, 2008 showed severe obstructive sleep apnea.  . Diastolic dysfunction     Grade I diastolic dysfunction as shown on ECHO Dec 2011.  EF of 65-70%.  Marland Kitchen COPD (chronic obstructive pulmonary disease)     Followed by Dr. Delford Field  . Dyslipidemia   . Osteopenia     By DXA scan 10/23/2009.  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 Corliss Skains on 11/10/2004.  MRA of the head on 03/09/2012 showed no evidence for recanalization.  . Osteoarthrosis involving multiple sites   . Chronic back pain   . Depression   . Anxiety   . Anemia   . GERD (gastroesophageal reflux disease)   . Meniere disease     Followed by ENT Dr. Lucky Cowboy  . Diverticulosis of colon   . Multiple pigmented nevi   . Hypokalemia   . S/P hysterectomy 1975  . Pneumonia 11/2004  . Knee pain   . Dysphagia   . Urosepsis 11/04/2010    Klebsiella pneumoniae  . Breast pain   . Allergic rhinitis   . Fibromyalgia   . Cerebral ventriculomegaly 03/09/2012    MRI of the brain on  03/09/2012 showed moderate ventricular enlargement out of proportion to the degree of cortical atrophy, most evident when compared with 2008, with a comment that normal  pressure hydrocephalus is not excluded.     . Urinary incontinence    Current Outpatient Prescriptions  Medication Sig Dispense Refill  . ALPRAZolam (XANAX) 0.5 MG tablet Take 1 to 1 and 1/2 tablets by mouth 3 times a day as needed for anxiety  120 tablet  3  . amitriptyline (ELAVIL) 150 MG tablet TAKE 1 TABLET BY MOUTH DAILY  31 tablet  5  . aspirin 81 MG EC tablet Take 81 mg by  mouth daily.        . Calcium Carbonate-Vitamin D (CALCIUM 600+D) 600-400 MG-UNIT per tablet Take 1 tablet by mouth 2 (two) times daily.  60 tablet  11  . carisoprodol (SOMA) 350 MG tablet TAKE 1 TABLET BY MOUTH FOUR TIMES DAILY AS NEEDED FOR MUSCLE SPASM  120 tablet  3  . Fluticasone-Salmeterol (ADVAIR DISKUS) 250-50 MCG/DOSE AEPB Inhale 1 puff into the lungs 2 (two) times daily.  60 each  11  . guaiFENesin (MUCINEX) 600 MG 12 hr tablet Take 600 mg by mouth 2 (two) times daily.        . hydrochlorothiazide (HYDRODIURIL) 25 MG tablet TAKE ONE TABLET BY MOUTH DAILY  31 tablet  6  . HYDROcodone-acetaminophen (VICODIN ES) 7.5-750 MG per tablet TAKE 1 TABLET BY MOUTH FOUR TIMES DAILY AS NEEDED FOR PAIN  120 tablet  3  . hydroxypropyl methylcellulose (ISOPTO TEARS) 2.5 % ophthalmic solution Place 2 drops into both eyes 3 (three) times daily as needed. Dry eye      . ibuprofen (ADVIL,MOTRIN) 200 MG tablet Take 400 mg by mouth 2 (two) times daily as needed. For pain        . KLOR-CON 10 10 MEQ tablet TAKE 3 TABLETS BY MOUTH EVERY DAY  90 tablet  2  . LIDODERM 5 % PLACE ONE PATCH ONTO THE SKIN DAILY , REMOVE AND DISCARD PATCH WITHIN 12 HOURS AS DIRECTED  30 each  3  . meclizine (ANTIVERT) 12.5 MG tablet TAKE 1 TABLET BY MOUTH TWICE DAILY AS NEEDED FOR DIZZINESS  60 tablet  2  . NIFEdipine (PROCARDIA XL/ADALAT-CC) 90 MG 24 hr tablet Take 1 tablet (90 mg total) by mouth daily.  90 tablet  3  . omeprazole (PRILOSEC) 20 MG capsule TAKE 1 CAPSULE BY MOUTH TWICE DAILY  60 capsule  5  . PROAIR HFA 108 (90 BASE) MCG/ACT inhaler INHALE 2 PUFFS INTO THE LUNGS FOUR TIMES DAILY AS NEEDED  1 Inhaler  5  . sertraline (ZOLOFT) 100 MG tablet TAKE 1 TABLET BY MOUTH TWICE DAILY  60 tablet  3  . tiotropium (SPIRIVA HANDIHALER) 18 MCG inhalation capsule Place 1 capsule (18 mcg total) into inhaler and inhale daily.  30 capsule  11  . triamcinolone cream (KENALOG) 0.1 % APPLY TOPICALLY TO THE AFFECTED AREA OF HAND TWICE DAILY  AS NEEDED  30 g  0  . benzonatate (TESSALON PERLES) 100 MG capsule Take 1 capsule (100 mg total) by mouth 3 (three) times daily as needed for cough.  21 capsule  0  . iron polysaccharides (NIFEREX) 150 MG capsule Take 1 capsule once daily with meals.      . ondansetron (ZOFRAN-ODT) 4 MG disintegrating tablet       . topiramate (TOPAMAX) 50 MG tablet        Family History  Problem Relation Age of Onset  .  Ulcers Mother   . Ovarian cancer Sister 24  . Cervical cancer Daughter   . Cervical cancer Daughter   . Lung cancer Neg Hx   . Colon cancer Neg Hx   . Heart attack Father   . Cerebral aneurysm Sister 43   History   Social History  . Marital Status: Widowed    Spouse Name: N/A    Number of Children: N/A  . Years of Education: N/A   Social History Main Topics  . Smoking status: Former Smoker -- 0.5 packs/day for 30 years    Types: Cigarettes    Quit date: 12/07/2008  . Smokeless tobacco: Former Neurosurgeon    Types: Chew    Quit date: 12/06/1978  . Alcohol Use: No  . Drug Use: No  . Sexually Active: No   Other Topics Concern  . None   Social History Narrative   Financial assistance approved for 100% discount after Medicare pays for [facility] only, not eligible for [facility] card. Per Rudell Cobb   Review of Systems: Constitutional: Denies fever, chills, diaphoresis, appetite change and fatigue.  HEENT: Hx. Of Menire's, + sebaceous cyst on frontal aspect of neck. Denies photophobia, eye pain, redness, ear pain, congestion, sore throat, rhinorrhea, sneezing, mouth sores, trouble swallowing, neck stiffness and tinnitus.   Respiratory: Dry cough. Denies SOB, DOE, chest tightness,  and wheezing.   Cardiovascular: Denies chest pain, palpitations and leg swelling.  Gastrointestinal: Denies nausea, vomiting, abdominal pain, diarrhea, constipation, blood in stool and abdominal distention.  Genitourinary: Denies dysuria, urgency, frequency, hematuria, flank pain and difficulty urinating.    Musculoskeletal: Hx. Of chronic body pain. Walks with cane.   Skin: Denies pallor, rash and wound.  Neurological: Hx. Of cererbral aneurysm and ventriculomegaly. Denies dizziness, seizures, syncope, weakness, light-headedness, numbness and headaches.  Hematological: Denies adenopathy. Easy bruising, personal or family bleeding history  Psychiatric/Behavioral: Hx. Of Depression. Denies suicidal ideation, mood changes, confusion, nervousness, sleep disturbance and agitation  Objective:  Physical Exam: Filed Vitals:   08/22/12 1429  BP: 135/87  Pulse: 85  Temp: 98 F (36.7 C)  TempSrc: Oral  Height: 5\' 3"  (1.6 m)  Weight: 238 lb 6.4 oz (108.138 kg)  SpO2: 96%   Constitutional: Vital signs reviewed.  Patient is a well-developed and well-nourished female in no acute distress and cooperative with exam. Alert and oriented x3.  Head: Normocephalic and atraumatic Ear: TM normal bilaterally Mouth: no erythema or exudates, MMM Eyes: PERRLA, EOMI, conjunctivae normal, No scleral icterus.  Neck: Supple, + 2-3cm sebacious cyst anterior aspect, red, inflamed, + white center, oozing pus, tender to palpation.  Cardiovascular: RRR, S1 normal, S2 normal, no MRG, pulses symmetric and intact bilaterally Pulmonary/Chest: CTAB, no wheezes, rales, or rhonchi Abdominal: Soft. Non-tender, non-distended, bowel sounds are normal, no masses, organomegaly, or guarding present.  GU: no CVA tenderness Musculoskeletal: Diffuse body aches. No joint deformities, erythema, or stiffness, ROM full and non tender. Walks with cane. +2dp b/l. Hematology: no cervical, inginal, or axillary adenopathy.  Neurological: A&O x3, Strength is normal and symmetric bilaterally, cranial nerve II-XII are grossly intact, no focal motor deficit, sensory intact to light touch bilaterally. Gait unstable, walks with cane and with assistance, sways back and forth when standing with eyes closed.  Finger to nose testing slight better with left  hand than right.  Finger to thumb testing slightly more accurate with left hand than right.   Skin: Warm, dry and intact. No rash, cyanosis, or clubbing.  Psychiatric: Normal mood and affect.  speech and behavior is normal. Judgment and thought content normal. Cognition and memory are normal.   Assessment & Plan:   Case discussed with Dr. Rogelia Boga  Health maintenance-flu vaccine given today, mammogram scheduled for this Friday, prescription for shingles vaccine given today.  Sebaceous cyst of neck-removed thick white oozing substance from center, will likely need to remove after holding aspirin for 7 days. Ms. grajeda is scheduled to see a neurologist this week, asked to asked neurologist if appropriate to hold aspirin for approximately 7 days. If aspirin can be held, schedule removal of cyst in 1 weeks time. Continue to monitor  Dry cough-prescribed Tessalon perles and advised to use over-the-counter Claritin as needed.

## 2012-08-22 NOTE — Assessment & Plan Note (Signed)
1-2 week onset, tender to palpation, oozing pus.  Approximately 2-3cm, central white portion, red, inflamed, no similar cysts in the past. Daughter has been treating it with neosporin and bandaging. Thick white material squeezed out of cyst.  -will need removal but will need to be off ASA for at least 7 days.  Asked to follow up with neurology and ask if can be off ASA for 7 days for cyst removal, if approved, will schedule appointment for removal at that time. -continue to monitor

## 2012-08-22 NOTE — Patient Instructions (Addendum)
Flu vaccine given today  Prescription given for Shingles vaccine, take to pharmacy to evaluate cost, if affordable, we recommend for you to have it  Please follow up with Dr. Meredith Pel in approximately three months  Please take Jennifer [name] for cough and monitor for improvement  Please go for mammogram as scheduled for this Friday.  Follow up with neurologist in regards to repeat MRI and ask if we can hold ASA for 7 days for sebaceous cyst removal on chin.   Please call clinic with neurologist recommendations if okay to stop ASA

## 2012-08-22 NOTE — Assessment & Plan Note (Addendum)
Dry cough x2 weeks, denies redness, itchy eyes, nasal congestion, or watery eyes, but claims to have hx of indoor and outdoor allergies.  Does not smoke but daughter who is caretaker does smoke.  Possibly secondary to allergies.  Claim to have tried otc tussin and cough syrups to no relief.  -Prescribed Tessalon Perles for cough, also advised to take claritin otc to see if that helps -f/u with pcp -if cough worsens or does not improve call or come to clinic

## 2012-08-22 NOTE — Assessment & Plan Note (Signed)
Scheduled to follow up with Dr. Pearlean Brownie this week and will apparently have follow up MRI.    -f/u neurology -continue to monitor

## 2012-08-23 NOTE — Progress Notes (Signed)
I saw, examined, and discussed the patient with Dr Virgina Organ and agree with the note contained here. Dr Lonzo Cloud was kind enough to also exam the lesion on the neck and confirmed that it is a sebaceous cyst. Conservative tx one week. If no better and neuro Ok's holding ASA, then removal.

## 2012-08-24 ENCOUNTER — Ambulatory Visit
Admission: RE | Admit: 2012-08-24 | Discharge: 2012-08-24 | Disposition: A | Discharge status: Home / Self Care | Payer: Medicare HMO | Source: Ambulatory Visit | Attending: Neurology | Admitting: Neurology

## 2012-08-24 DIAGNOSIS — G919 Hydrocephalus, unspecified: Secondary | ICD-10-CM

## 2012-08-24 MED ORDER — GADOBENATE DIMEGLUMINE 529 MG/ML IV SOLN
20.0000 mL | Freq: Once | INTRAVENOUS | Status: AC | PRN
  Administered 2012-08-24: 20 mL via INTRAVENOUS

## 2012-08-25 ENCOUNTER — Ambulatory Visit (HOSPITAL_COMMUNITY): Payer: Medicare HMO

## 2012-09-04 ENCOUNTER — Other Ambulatory Visit: Payer: Self-pay | Admitting: Internal Medicine

## 2012-09-04 NOTE — Telephone Encounter (Signed)
Soma, Xanax and Vicodin rxs called in to [facility].

## 2012-09-04 NOTE — Telephone Encounter (Signed)
I approved the carisoprodol refill. There should be refills available on existing prescriptions dated 08/01/12 for alprazolam and hydrocodone/acetaminophen; please confirm.

## 2012-09-14 ENCOUNTER — Ambulatory Visit (HOSPITAL_COMMUNITY): Payer: Medicare HMO

## 2012-09-21 ENCOUNTER — Other Ambulatory Visit: Payer: Self-pay | Admitting: *Deleted

## 2012-09-21 MED ORDER — HYDROCHLOROTHIAZIDE 25 MG PO TABS: 25.0000 mg | ORAL_TABLET | Freq: Every day | ORAL | Status: DC

## 2012-09-22 ENCOUNTER — Other Ambulatory Visit: Payer: Self-pay | Admitting: Internal Medicine

## 2012-09-27 ENCOUNTER — Ambulatory Visit (HOSPITAL_COMMUNITY): Payer: Medicare HMO

## 2012-10-11 ENCOUNTER — Other Ambulatory Visit: Payer: Self-pay | Admitting: Internal Medicine

## 2012-11-06 ENCOUNTER — Other Ambulatory Visit: Payer: Self-pay | Admitting: Internal Medicine

## 2012-11-06 NOTE — Telephone Encounter (Signed)
Refill approved - nurse to call in. 

## 2012-11-06 NOTE — Telephone Encounter (Signed)
Rx called in 

## 2012-11-08 ENCOUNTER — Other Ambulatory Visit: Payer: Self-pay | Admitting: *Deleted

## 2012-11-08 MED ORDER — LIDOCAINE 5 % EX PTCH: 1.0000 | MEDICATED_PATCH | CUTANEOUS | Status: DC

## 2012-11-14 ENCOUNTER — Telehealth: Payer: Self-pay | Admitting: *Deleted

## 2012-11-14 NOTE — Telephone Encounter (Signed)
Pt calls and states she has been trying to get an appt w/ dr Meredith Pel for several months and cannot do so, she states she has multiple questions concerning her care and decisions to be made. Dr Meredith Pel has no appt slots til feb 2014, she is offered an appt w/ someone else and is agreeable, appt per charset[place]. 12/11 at 1330 dr Saralyn Pilar

## 2012-11-15 ENCOUNTER — Ambulatory Visit: Payer: Medicare HMO | Admitting: Internal Medicine

## 2012-11-22 ENCOUNTER — Other Ambulatory Visit: Payer: Self-pay | Admitting: *Deleted

## 2012-11-22 MED ORDER — OMEPRAZOLE 20 MG PO CPDR: 20.0000 mg | DELAYED_RELEASE_CAPSULE | Freq: Two times a day (BID) | ORAL | Status: DC

## 2012-12-04 ENCOUNTER — Other Ambulatory Visit: Payer: Self-pay | Admitting: Internal Medicine

## 2012-12-04 NOTE — Telephone Encounter (Signed)
Called to pharm 

## 2012-12-04 NOTE — Progress Notes (Signed)
Last refills for both Vicodin and xanax on 08/01/2012. Appropriate refill timing as 3 refills were given at that time. Last office visit 9/17. No red flag signs. Pain contract no in Place. Will refill one month supply at this time.

## 2012-12-08 ENCOUNTER — Telehealth: Payer: Self-pay | Admitting: *Deleted

## 2012-12-08 NOTE — Telephone Encounter (Signed)
I will review the forms to see if patient meets their criteria for prior authorization.

## 2012-12-08 NOTE — Telephone Encounter (Signed)
Call to [facility] for Prior Authorization for Lidocaine 5% Patches.  [employer] to fax forms for Dr.Joines to complete and return to [facility] for review.  Angelina Ok, RN 12/08/2012 10:33 AM.  Angelina Ok, RN 12/08/2012 10:33 AM.

## 2012-12-11 ENCOUNTER — Other Ambulatory Visit: Payer: Self-pay | Admitting: Internal Medicine

## 2012-12-13 ENCOUNTER — Other Ambulatory Visit: Payer: Self-pay | Admitting: *Deleted

## 2012-12-13 MED ORDER — POTASSIUM CHLORIDE ER 10 MEQ PO TBCR: EXTENDED_RELEASE_TABLET | ORAL | Status: DC

## 2012-12-16 ENCOUNTER — Other Ambulatory Visit: Payer: Self-pay | Admitting: Internal Medicine

## 2012-12-27 ENCOUNTER — Ambulatory Visit: Payer: Medicare HMO | Admitting: Internal Medicine

## 2012-12-28 ENCOUNTER — Other Ambulatory Visit: Payer: Self-pay | Admitting: Internal Medicine

## 2012-12-29 NOTE — Telephone Encounter (Signed)
Refill approved - nurse to call in. 

## 2013-01-01 ENCOUNTER — Other Ambulatory Visit: Payer: Self-pay | Admitting: Internal Medicine

## 2013-01-01 MED ORDER — HYDROCODONE-ACETAMINOPHEN 7.5-325 MG PO TABS: 1.0000 | ORAL_TABLET | Freq: Four times a day (QID) | ORAL | Status: DC | PRN

## 2013-01-01 NOTE — Telephone Encounter (Signed)
Called to pharm, changed the hydrocodone to 7.5/ 325mg , please change med list

## 2013-01-01 NOTE — Telephone Encounter (Signed)
Dose changed as requested.

## 2013-01-07 ENCOUNTER — Other Ambulatory Visit: Payer: Self-pay | Admitting: Internal Medicine

## 2013-01-10 ENCOUNTER — Other Ambulatory Visit: Payer: Self-pay | Admitting: *Deleted

## 2013-01-10 MED ORDER — AMITRIPTYLINE HCL 150 MG PO TABS: 150.0000 mg | ORAL_TABLET | Freq: Every day | ORAL | Status: DC

## 2013-01-17 ENCOUNTER — Ambulatory Visit: Payer: Medicare HMO | Admitting: Internal Medicine

## 2013-01-27 ENCOUNTER — Other Ambulatory Visit: Payer: Self-pay | Admitting: Critical Care Medicine

## 2013-01-27 ENCOUNTER — Other Ambulatory Visit: Payer: Self-pay | Admitting: Internal Medicine

## 2013-01-29 ENCOUNTER — Other Ambulatory Visit: Payer: Self-pay | Admitting: Internal Medicine

## 2013-01-29 NOTE — Telephone Encounter (Signed)
Refill approved - nurse to complete. 

## 2013-01-30 ENCOUNTER — Other Ambulatory Visit: Payer: Self-pay | Admitting: Internal Medicine

## 2013-01-30 NOTE — Telephone Encounter (Signed)
I recently approved this (see 2/22 note) for nurse to call in.

## 2013-02-02 ENCOUNTER — Other Ambulatory Visit: Payer: Self-pay | Admitting: Internal Medicine

## 2013-02-02 ENCOUNTER — Telehealth: Payer: Self-pay | Admitting: *Deleted

## 2013-02-02 NOTE — Telephone Encounter (Signed)
Jennifer [name] is 120 per month, someone tried to fill it for 3 month supply and that is why the PA was requested, pharmacist, doug, states he will fix the problem and a PA will not be needed

## 2013-02-02 NOTE — Telephone Encounter (Signed)
I already approved this refill for nurse to call in (see 2/22 note); please make sure it is called in as previously requested.

## 2013-02-02 NOTE — Telephone Encounter (Signed)
Call to [facility] for Prior Authorization for Carisoprodol 350 mg tablets # 360.  Forms for PCP to be faxed for completion for review and Prior Authorization process.  Angelina Ok, RN 02/02/2013 10:57 AM.

## 2013-02-07 ENCOUNTER — Ambulatory Visit (HOSPITAL_COMMUNITY)
Admission: RE | Admit: 2013-02-07 | Discharge: 2013-02-07 | Disposition: A | Discharge status: Home / Self Care | Payer: Medicare HMO | Source: Ambulatory Visit | Attending: Internal Medicine | Admitting: Internal Medicine

## 2013-02-07 ENCOUNTER — Other Ambulatory Visit: Payer: Self-pay | Admitting: *Deleted

## 2013-02-07 ENCOUNTER — Encounter: Payer: Self-pay | Admitting: Internal Medicine

## 2013-02-07 ENCOUNTER — Ambulatory Visit (INDEPENDENT_AMBULATORY_CARE_PROVIDER_SITE_OTHER): Payer: Medicare HMO | Admitting: Internal Medicine

## 2013-02-07 VITALS — BP 120/80 | HR 79 | Temp 97.6°F | Ht 63.0 in | Wt 244.0 lb

## 2013-02-07 DIAGNOSIS — R609 Edema, unspecified: Secondary | ICD-10-CM

## 2013-02-07 DIAGNOSIS — M545 Low back pain, unspecified: Secondary | ICD-10-CM | POA: Insufficient documentation

## 2013-02-07 DIAGNOSIS — M25551 Pain in right hip: Secondary | ICD-10-CM

## 2013-02-07 DIAGNOSIS — J449 Chronic obstructive pulmonary disease, unspecified: Secondary | ICD-10-CM

## 2013-02-07 DIAGNOSIS — W19XXXA Unspecified fall, initial encounter: Secondary | ICD-10-CM | POA: Insufficient documentation

## 2013-02-07 DIAGNOSIS — R600 Localized edema: Secondary | ICD-10-CM

## 2013-02-07 DIAGNOSIS — G4733 Obstructive sleep apnea (adult) (pediatric): Secondary | ICD-10-CM

## 2013-02-07 DIAGNOSIS — G8929 Other chronic pain: Secondary | ICD-10-CM

## 2013-02-07 DIAGNOSIS — F329 Major depressive disorder, single episode, unspecified: Secondary | ICD-10-CM

## 2013-02-07 DIAGNOSIS — D649 Anemia, unspecified: Secondary | ICD-10-CM

## 2013-02-07 DIAGNOSIS — M549 Dorsalgia, unspecified: Secondary | ICD-10-CM

## 2013-02-07 DIAGNOSIS — M25559 Pain in unspecified hip: Secondary | ICD-10-CM | POA: Insufficient documentation

## 2013-02-07 DIAGNOSIS — E785 Hyperlipidemia, unspecified: Secondary | ICD-10-CM

## 2013-02-07 DIAGNOSIS — I10 Essential (primary) hypertension: Secondary | ICD-10-CM

## 2013-02-07 LAB — CBC WITH DIFFERENTIAL/PLATELET
Basophils Absolute: 0 10*3/uL (ref 0.0–0.1)
Eosinophils Absolute: 0.3 10*3/uL (ref 0.0–0.7)
Eosinophils Relative: 4 % (ref 0–5)
MCH: 33 pg (ref 26.0–34.0)
MCV: 96 fL (ref 78.0–100.0)
Platelets: 260 10*3/uL (ref 150–400)
RDW: 13.4 % (ref 11.5–15.5)

## 2013-02-07 LAB — LIPID PANEL
Cholesterol: 256 mg/dL — ABNORMAL HIGH (ref 0–200)
LDL Cholesterol: 153 mg/dL — ABNORMAL HIGH (ref 0–99)
VLDL: 33 mg/dL (ref 0–40)

## 2013-02-07 LAB — COMPLETE METABOLIC PANEL WITH GFR
ALT: 14 U/L (ref 0–35)
AST: 21 U/L (ref 0–37)
Albumin: 4 g/dL (ref 3.5–5.2)
BUN: 9 mg/dL (ref 6–23)
Calcium: 9.4 mg/dL (ref 8.4–10.5)
Chloride: 103 mEq/L (ref 96–112)
Potassium: 3.8 mEq/L (ref 3.5–5.3)
Total Protein: 6.3 g/dL (ref 6.0–8.3)

## 2013-02-07 NOTE — Assessment & Plan Note (Signed)
BP Readings from Last 3 Encounters:  02/07/13 120/80  08/22/12 135/87  08/12/12 141/66    Lab Results  Component Value Date   NA 136 06/21/2012   K 3.8 06/21/2012   CREATININE 0.51 06/21/2012    Assessment:  Blood pressure control: controlled  Progress toward BP goal:  at goal  Comments: Blood pressure is well controlled on current regimen of hydrochlorothiazide 25 mg daily and nifedipine (Adalat CC) 90 mg daily.  Plan:  Medications:  continue current medications  Educational resources provided: brochure  Self management tools provided: home blood pressure logbook

## 2013-02-07 NOTE — Patient Instructions (Addendum)
General Instructions: Continue current medications. Please use your walker or cane to assist with ambulation. Please contact your CPAP provider to assess the problems with your CPAP machine. Please schedule your screening mammogram which is overdue. Please obtain documentation from your pharmacy of when your shingles vaccine (Zostavax) was given and bring or mail to the clinic.   Treatment Goals:  Goals (1 Years of Data) as of 02/07/13         As of Today As of Today As of Today As of Today 08/22/12     Blood Pressure    . Blood Pressure < 140/90  120/80 120/80 120/80 136/85 135/87     Result Component    . LDL CALC < 130            Progress Toward Treatment Goals:  Treatment Goal 02/07/2013  Blood pressure at goal    Self Care Goals & Plans:  Self Care Goal 02/07/2013  Monitor my health keep track of my weight; keep track of my blood pressure  Eat healthy foods eat more vegetables; eat foods that are low in salt  Be physically active take a walk every day       Care Management & Community Referrals:  Referral 02/07/2013  Referrals made for care management support none needed  Referrals made to community resources other (see comments)

## 2013-02-07 NOTE — Assessment & Plan Note (Signed)
Assessment:  Patient reports right lower extremity edema for the past 3 weeks, with no associated pain.  A right lower extremity venous duplex study today showed no evidence of DVT, superficial thrombosis, or Baker's cyst.  Plan: I advised patient to try support stockings and elevation of her legs periodically during the day to see if her edema improves; she has trace left leg edema as well.  She has a history of diastolic dysfunction and her edema may be due to that problem.  She is currently on hydrochlorothiazide 25 mg daily.  I advised her limiting no if her edema persists or worsens, in which case it might be advisable to change her diuretic to furosemide.

## 2013-02-07 NOTE — Assessment & Plan Note (Signed)
Lab Results  Component Value Date   HGB 12.9 02/16/2012   FERRITIN 39 01/22/2009     Assessment: Patient has a history of anemia and has been on iron supplementation although she has not been taking it recently.  She recently reports increased fatigability.  Plan: Will check a CBC and ferritin level today.

## 2013-02-07 NOTE — Telephone Encounter (Signed)
I called for PA for pt's Carisoprodol as she is out of this med.  Pt had been given a transitional refill but needs PA form filled out. They are faxing this to me for completion. Pt ID # [id number] PA # [phone]

## 2013-02-07 NOTE — Assessment & Plan Note (Signed)
Lipids:    Component Value Date/Time   CHOL 295* 02/16/2012 1237   TRIG 175* 02/16/2012 1237   HDL 69 02/16/2012 1237   LDLCALC 191* 02/16/2012 1237   VLDL 35 02/16/2012 1237   CHOLHDL 4.3 02/16/2012 1237    Assessment: Patient's last LDL was elevated; she previously reported muscle aches on rosuvastatin in 2008.  Patient has been on dietary management since her last lipid panel which was done about 11 months ago.  Plan: Will check a comprehensive metabolic panel and a lipid panel today.

## 2013-02-07 NOTE — Assessment & Plan Note (Signed)
Assessment: Patient is doing well off of home O2; she reports that she is using her inhaled bronchodilators as prescribed.  Plan: Continue current regimen of Advair Diskus 250-50 one puff twice a day, Pro-air inhaler 2 puffs 4 times a day as needed, and Spiriva one inhalation daily.

## 2013-02-07 NOTE — Assessment & Plan Note (Addendum)
Assessment: Patient has chronic low back pain which has been worse since a fall in early January.  Her pain is reasonably well controlled on her current regimen of hydrocodone 7.5 mg-acetaminophen 325 mg four times a day as needed for pain; she is also on chronic carisoprodol 350 mg four times a day as needed for muscle spasm.  X-rays today of bilateral hips and pelvis showed no acute bony abnormality.  Plan: Continue current regimen of hydrocodone 7.5 mg-acetaminophen 325 mg 4 times a day as needed for pain; continue carisoprodol but consider reducing the dose gradually given patient's age and recent complaint of increased dizziness.  Patient has been on carisoprodol for years and there would likely be some risk in stopping it abruptly.

## 2013-02-07 NOTE — Progress Notes (Signed)
*  PRELIMINARY RESULTS* Vascular Ultrasound Right lower extremity venous duplex has been completed.  Preliminary findings: Right:  No evidence of DVT, superficial thrombosis, or Baker's cyst.   Farrel Demark, RDMS, RVT  02/07/2013, 11:31 AM

## 2013-02-07 NOTE — Assessment & Plan Note (Signed)
Assessment: Patient reports that her depression is well controlled on her current regimen of amitriptyline 150 mg daily and sertraline 100 mg twice a day.  Plan: Continue current regimen.
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
## 2013-02-07 NOTE — Assessment & Plan Note (Signed)
Assessment: Patient reports some recent problems with her CPAP machine.   Plan: I advised her to have the home health agency assess the machine and see if a new one is required or if it needs to be maintained.

## 2013-02-07 NOTE — Assessment & Plan Note (Signed)
Assessment: Patient reports bilateral hip and increased chronic low back pain since a fall in early January.  Her pain is reasonably well controlled on her current regimen of hydrocodone 7.5 mg-acetaminophen 325 mg four times a day as needed for pain; she is also on chronic carisoprodol 350 mg four times a day as needed for muscle spasm.  X-rays today of bilateral hips and pelvis showed no acute bony abnormality.  Plan: Continue current regimen of hydrocodone 7.5 mg-acetaminophen 325 mg 4 times a day as needed for pain.

## 2013-02-08 ENCOUNTER — Telehealth: Payer: Self-pay | Admitting: *Deleted

## 2013-02-08 ENCOUNTER — Other Ambulatory Visit: Payer: Self-pay | Admitting: Internal Medicine

## 2013-02-08 DIAGNOSIS — M549 Dorsalgia, unspecified: Secondary | ICD-10-CM

## 2013-02-08 MED ORDER — SERTRALINE HCL 100 MG PO TABS: ORAL_TABLET | ORAL | Status: DC

## 2013-02-08 MED ORDER — CARISOPRODOL 350 MG PO TABS: 350.0000 mg | ORAL_TABLET | Freq: Three times a day (TID) | ORAL | Status: DC | PRN

## 2013-02-08 NOTE — Assessment & Plan Note (Signed)
Telephone Contact Note  I called patient and discussed carisoprodol, which she has been taking for years for chronic back pain.  Given her fall in January and her complaint of increased dizziness, I am concerned that the carisoprodol may be contributing.  However, the drug information for this medication cautions against abrupt discontinuation since that may precipitate withdrawal after prolonged use.  I called patient today and discussed these concerns with her.  She reports that for the past couple of months she has often been taking the medication 4 times a day.  I advised that she cut back to 3 times a day as needed for now, with a plan to continue tapering slowly as tolerated.  She will let me know how she does with the reduction in dose, and she is in agreement with the plan.

## 2013-02-08 NOTE — Telephone Encounter (Signed)
Prior Authorization for Carisoprodol 350 mg was received from [facility] starting 02/08/2013 until 12/05/2013.  Pt's pharmacy [facility] has been notified.  Angelina Ok, RN 02/08/2013 3;35 PM.

## 2013-02-08 NOTE — Progress Notes (Signed)
I called patient and discussed carisoprodol, which she has been taking for years for chronic back pain.  Given her fall in January and her complaint of increased dizziness, I am concerned that the carisoprodol may be contributing.  However, the drug information for this medication cautions against abrupt discontinuation since that may precipitate withdrawal after prolonged use.  I called patient today and discussed these concerns with her.  She reports that for the past couple of months she has often been taking the medication 4 times a day.  I advised that she cut back to 3 times a day as needed for now, with a plan to continue tapering slowly as tolerated.  She will let me know how she does with the reduction in dose, and she is in agreement with the plan.

## 2013-02-13 ENCOUNTER — Ambulatory Visit: Payer: Medicare HMO

## 2013-02-21 ENCOUNTER — Other Ambulatory Visit: Payer: Self-pay | Admitting: Internal Medicine

## 2013-02-25 ENCOUNTER — Other Ambulatory Visit: Payer: Self-pay | Admitting: Internal Medicine

## 2013-02-27 ENCOUNTER — Ambulatory Visit: Payer: Medicare HMO | Admitting: Adult Health

## 2013-03-02 ENCOUNTER — Ambulatory Visit: Payer: Medicare HMO | Admitting: Adult Health

## 2013-03-04 ENCOUNTER — Other Ambulatory Visit: Payer: Self-pay | Admitting: Neurology

## 2013-03-08 ENCOUNTER — Ambulatory Visit: Payer: Medicare HMO | Admitting: Adult Health

## 2013-03-13 ENCOUNTER — Other Ambulatory Visit: Payer: Self-pay | Admitting: *Deleted

## 2013-03-13 MED ORDER — ALPRAZOLAM 0.5 MG PO TABS: 0.5000 mg | ORAL_TABLET | Freq: Three times a day (TID) | ORAL | Status: DC | PRN

## 2013-03-13 MED ORDER — ALBUTEROL SULFATE HFA 108 (90 BASE) MCG/ACT IN AERS: INHALATION_SPRAY | RESPIRATORY_TRACT | Status: DC

## 2013-03-13 MED ORDER — FLUTICASONE-SALMETEROL 250-50 MCG/DOSE IN AEPB: 1.0000 | INHALATION_SPRAY | Freq: Two times a day (BID) | RESPIRATORY_TRACT | Status: DC

## 2013-03-13 MED ORDER — CARISOPRODOL 350 MG PO TABS: 350.0000 mg | ORAL_TABLET | Freq: Three times a day (TID) | ORAL | Status: DC | PRN

## 2013-03-13 MED ORDER — HYDROCODONE-ACETAMINOPHEN 7.5-325 MG PO TABS: 1.0000 | ORAL_TABLET | Freq: Four times a day (QID) | ORAL | Status: DC | PRN

## 2013-03-13 MED ORDER — TIOTROPIUM BROMIDE MONOHYDRATE 18 MCG IN CAPS: 18.0000 ug | ORAL_CAPSULE | Freq: Every day | RESPIRATORY_TRACT | Status: DC

## 2013-03-13 NOTE — Telephone Encounter (Signed)
Received Faxed refill request from [facility] for advair, proair, and spiriva.   Pt's last OV with PW 05/02/12; asked to f/u in 1 yr. No pending OV at this time. Called, spoke with pt's daughter who is requesting to call back to schedule appt as she has pt's PCP on the other line. She is aware rx will be sent for 1 fill (3 month supply as this is mail order) and is aware pt's OV due in May. She will call back to schedule OV. Nothing further needed at this time.

## 2013-03-13 NOTE — Telephone Encounter (Signed)
Pt's daughter called asking for a new Rx for a  CPAP machine.  The one she has now is over 74 years old and is leaking and has a light that blinks all the time.  Pt's insurance is [employer] and they no longer use [facility] services.  They use [employer] ( # [id number] ) Rx will need to include machine settings also need copy of sleep study, demographics and office notes within the last year addressing resp. Problem.

## 2013-03-13 NOTE — Telephone Encounter (Signed)
Patient is changing pharmacies; please find out the date these medications were last filled, then call in the prescriptions I signed to be refilled one month from the last fill date.

## 2013-03-13 NOTE — Telephone Encounter (Signed)
Rx called in to [facility] - last refill  alprazolam 02/25/13, hydrocodone-acetaminophen last refill 02/25/13 and generic Soma last refill 03/12/13 - Dr Meredith Pel aware.

## 2013-03-14 NOTE — Telephone Encounter (Signed)
Please find out if the original sleep study and CPAP titration will be adequate, or whether these need to be repeated.

## 2013-03-15 ENCOUNTER — Telehealth: Payer: Self-pay | Admitting: *Deleted

## 2013-03-15 DIAGNOSIS — G4733 Obstructive sleep apnea (adult) (pediatric): Secondary | ICD-10-CM

## 2013-03-15 NOTE — Telephone Encounter (Addendum)
Spoke with [employer] @ [phone] after speaking with pt's dtr Meriam Sprague) concerning pt's CPAP machine.  Meriam Sprague states that machine is "leaking" and has a light on it that keeps flashing.  She also states that pt has had this machine for about 5-6 years and needed a new one.  [employer] infomed me that pt's dtr had already contacted them concerning need for a new machine and paperwork has already been faxed to our office.  Will forward paperwork to MD for completion.  Pt last sleep study was 04/02/2008 and [facility] has requested that sleep study report and most recent office visit be faxed along with completed paperwork.Kingsley Spittle Cassady4/10/201410:42 AM

## 2013-03-15 NOTE — Telephone Encounter (Signed)
I signed orders as requested.

## 2013-03-27 ENCOUNTER — Other Ambulatory Visit: Payer: Self-pay | Admitting: Internal Medicine

## 2013-03-27 ENCOUNTER — Other Ambulatory Visit: Payer: Self-pay | Admitting: Neurology

## 2013-03-27 NOTE — Telephone Encounter (Signed)
The alprazolam was called in on 4/8 to be filled on 4/23 (one month from when the last refill was dispensed); please confirm that this medication was called in to the pharmacy.

## 2013-03-28 ENCOUNTER — Other Ambulatory Visit: Payer: Self-pay | Admitting: *Deleted

## 2013-03-28 MED ORDER — AMITRIPTYLINE HCL 150 MG PO TABS: 150.0000 mg | ORAL_TABLET | Freq: Every day | ORAL | Status: DC

## 2013-03-28 MED ORDER — SERTRALINE HCL 100 MG PO TABS: ORAL_TABLET | ORAL | Status: DC

## 2013-03-28 MED ORDER — OMEPRAZOLE 20 MG PO CPDR: DELAYED_RELEASE_CAPSULE | ORAL | Status: DC

## 2013-03-28 MED ORDER — MECLIZINE HCL 12.5 MG PO TABS: ORAL_TABLET | ORAL | Status: DC

## 2013-03-28 MED ORDER — NIFEDIPINE ER 90 MG PO TB24: ORAL_TABLET | ORAL | Status: DC

## 2013-03-28 MED ORDER — HYDROCHLOROTHIAZIDE 25 MG PO TABS: ORAL_TABLET | ORAL | Status: DC

## 2013-03-28 MED ORDER — POTASSIUM CHLORIDE ER 10 MEQ PO TBCR: EXTENDED_RELEASE_TABLET | ORAL | Status: DC

## 2013-03-28 NOTE — Telephone Encounter (Signed)
Pt is using [facility] pharmacy; requesting new rxs.

## 2013-04-15 ENCOUNTER — Other Ambulatory Visit: Payer: Self-pay | Admitting: Critical Care Medicine

## 2013-04-16 ENCOUNTER — Other Ambulatory Visit: Payer: Self-pay | Admitting: *Deleted

## 2013-04-16 ENCOUNTER — Other Ambulatory Visit: Payer: Self-pay | Admitting: Internal Medicine

## 2013-04-16 NOTE — Telephone Encounter (Signed)
This was refilled on 03/13/13 with 2 additional refills - please find out why the early request.

## 2013-04-17 NOTE — Telephone Encounter (Signed)
Last OV with PW 05/02/12; asked to f/u in 1 yr Pt has a pending OV with PW on 04/27/13. Spiriva rx was sent to [facility] on 03/13/13 for #90 x 0 Local [facility] requesting rx. Will send rx for #30

## 2013-04-20 ENCOUNTER — Other Ambulatory Visit: Payer: Self-pay | Admitting: *Deleted

## 2013-04-20 NOTE — Telephone Encounter (Signed)
Pt is changing pharmacy

## 2013-04-23 ENCOUNTER — Other Ambulatory Visit (HOSPITAL_COMMUNITY): Payer: Self-pay | Admitting: Interventional Radiology

## 2013-04-23 DIAGNOSIS — I729 Aneurysm of unspecified site: Secondary | ICD-10-CM

## 2013-04-23 MED ORDER — FLUTICASONE-SALMETEROL 250-50 MCG/DOSE IN AEPB: 1.0000 | INHALATION_SPRAY | Freq: Two times a day (BID) | RESPIRATORY_TRACT | Status: DC

## 2013-04-23 MED ORDER — ALBUTEROL SULFATE HFA 108 (90 BASE) MCG/ACT IN AERS: INHALATION_SPRAY | RESPIRATORY_TRACT | Status: DC

## 2013-04-23 MED ORDER — MECLIZINE HCL 12.5 MG PO TABS: ORAL_TABLET | ORAL | Status: DC

## 2013-04-23 MED ORDER — TIOTROPIUM BROMIDE MONOHYDRATE 18 MCG IN CAPS: ORAL_CAPSULE | RESPIRATORY_TRACT | Status: DC

## 2013-04-23 MED ORDER — NIFEDIPINE ER 90 MG PO TB24: ORAL_TABLET | ORAL | Status: DC

## 2013-04-23 MED ORDER — ALPRAZOLAM 0.5 MG PO TABS: 0.5000 mg | ORAL_TABLET | Freq: Three times a day (TID) | ORAL | Status: DC | PRN

## 2013-04-23 MED ORDER — SERTRALINE HCL 100 MG PO TABS: ORAL_TABLET | ORAL | Status: DC

## 2013-04-23 MED ORDER — TOPIRAMATE 50 MG PO TABS: ORAL_TABLET | ORAL | Status: DC

## 2013-04-23 NOTE — Telephone Encounter (Signed)
Acct at [facility] is closed. Pt now gets Rx from [facility].

## 2013-04-23 NOTE — Telephone Encounter (Signed)
CALLED ALPRAZOLAM TO PHARMACY

## 2013-04-25 ENCOUNTER — Other Ambulatory Visit (HOSPITAL_COMMUNITY): Payer: Self-pay | Admitting: Interventional Radiology

## 2013-04-25 DIAGNOSIS — I729 Aneurysm of unspecified site: Secondary | ICD-10-CM

## 2013-04-27 ENCOUNTER — Ambulatory Visit: Payer: Medicare HMO | Admitting: Critical Care Medicine

## 2013-05-01 ENCOUNTER — Ambulatory Visit (HOSPITAL_COMMUNITY): Admission: RE | Admit: 2013-05-01 | Payer: Medicare HMO | Source: Ambulatory Visit

## 2013-05-03 ENCOUNTER — Ambulatory Visit (HOSPITAL_COMMUNITY): Payer: Medicare HMO

## 2013-05-03 ENCOUNTER — Ambulatory Visit (HOSPITAL_COMMUNITY)
Admission: RE | Admit: 2013-05-03 | Discharge: 2013-05-03 | Disposition: A | Discharge status: Home / Self Care | Payer: Medicare HMO | Source: Ambulatory Visit | Attending: Interventional Radiology | Admitting: Interventional Radiology

## 2013-05-03 DIAGNOSIS — I729 Aneurysm of unspecified site: Secondary | ICD-10-CM

## 2013-05-03 DIAGNOSIS — I728 Aneurysm of other specified arteries: Secondary | ICD-10-CM | POA: Insufficient documentation

## 2013-05-03 DIAGNOSIS — I671 Cerebral aneurysm, nonruptured: Secondary | ICD-10-CM | POA: Insufficient documentation

## 2013-05-03 DIAGNOSIS — G319 Degenerative disease of nervous system, unspecified: Secondary | ICD-10-CM | POA: Insufficient documentation

## 2013-05-03 LAB — CREATININE, SERUM: GFR calc Af Amer: >90 mL/min (ref >90)

## 2013-05-03 MED ORDER — GADOBENATE DIMEGLUMINE 529 MG/ML IV SOLN
20.0000 mL | Freq: Once | INTRAVENOUS | Status: AC
  Administered 2013-05-03: 20 mL via INTRAVENOUS

## 2013-05-11 ENCOUNTER — Telehealth: Payer: Self-pay | Admitting: *Deleted

## 2013-05-11 NOTE — Telephone Encounter (Signed)
Received PA request from pt's pharmacy in addition to recorded message from pt that she was unable to get her alprazolam.  Contacted [employer] and they sent a form to be completed by pcp.  Form completed by DrJoines, contacted [employer] at [phone], test claim was ran, and I was informed that medication no longer needed a PA, or the pharmacist may have ran it incorrectly. Contacted pt's pharmacy [facility], pt already had a refill left on rx, pharm technician processed refill and states she did see any reject messages.  Pt and pt's dtr was informed, phone call complete.Jennifer Spittle Cassady6/6/20144:56 PM

## 2013-05-22 ENCOUNTER — Ambulatory Visit: Payer: Medicare HMO | Admitting: Critical Care Medicine

## 2013-05-23 ENCOUNTER — Ambulatory Visit: Payer: Medicare HMO | Admitting: Internal Medicine

## 2013-05-23 ENCOUNTER — Other Ambulatory Visit: Payer: Self-pay | Admitting: Internal Medicine

## 2013-05-29 ENCOUNTER — Other Ambulatory Visit: Payer: Self-pay | Admitting: *Deleted

## 2013-05-29 NOTE — Telephone Encounter (Signed)
Please contact the pharmacy and find out when this was last dispensed and how many refills are left.

## 2013-06-01 MED ORDER — HYDROCODONE-ACETAMINOPHEN 7.5-325 MG PO TABS: 1.0000 | ORAL_TABLET | Freq: Four times a day (QID) | ORAL | Status: DC | PRN

## 2013-06-01 NOTE — Telephone Encounter (Signed)
Approved for refill on 7/5 with 1 additional refill - nurse to call in.

## 2013-06-01 NOTE — Telephone Encounter (Signed)
Rx called in 

## 2013-06-01 NOTE — Telephone Encounter (Signed)
Per [facility]: Last refill of vicodin 6/5 mailed to pt.  No refills left

## 2013-06-12 ENCOUNTER — Ambulatory Visit: Payer: Medicare HMO | Admitting: Critical Care Medicine

## 2013-06-15 ENCOUNTER — Ambulatory Visit (INDEPENDENT_AMBULATORY_CARE_PROVIDER_SITE_OTHER): Payer: Medicare HMO | Admitting: Critical Care Medicine

## 2013-06-15 ENCOUNTER — Encounter: Payer: Self-pay | Admitting: Critical Care Medicine

## 2013-06-15 VITALS — BP 122/78 | HR 90 | Temp 98.3°F | Ht 63.0 in | Wt 253.6 lb

## 2013-06-15 DIAGNOSIS — J449 Chronic obstructive pulmonary disease, unspecified: Secondary | ICD-10-CM

## 2013-06-15 MED ORDER — FLUTICASONE-SALMETEROL 250-50 MCG/DOSE IN AEPB: 1.0000 | INHALATION_SPRAY | Freq: Two times a day (BID) | RESPIRATORY_TRACT | Status: DC

## 2013-06-15 MED ORDER — METHYLPREDNISOLONE ACETATE 80 MG/ML IJ SUSP
120.0000 mg | Freq: Once | INTRAMUSCULAR | Status: AC
  Administered 2013-06-15: 120 mg via INTRAMUSCULAR

## 2013-06-15 NOTE — Patient Instructions (Addendum)
Depomedrol 120mg  IM will be given Refills will be issued  Return 1 year

## 2013-06-15 NOTE — Progress Notes (Signed)
Subjective:    Patient ID: Jennifer [name], female    DOB: Jul 23, 1941, 72 y.o.   MRN: [id number]  HPI  History of Present Illness:  72 y.o.    last smoked in 2009 with chronic obstructive lung disease, asthmatic bronchitic component.    06/15/2013 Chief Complaint  Patient presents with  . Yearly Follow up    Breathing has worsened over the past 2 wks.  Has increased SOB with activity and nonprod cough. No wheezing, chest tightness, or chest pain.    Pt ok until two weeks ago, then more dyspneic. Pt gets panic attacks.  Pt forced to move out of an old trailer, pipe burst.  Had to move into the trailer, dust issues.   Prob allergies as well.  Trailer not air conditioned.  Now in better environement   Past Medical History  Diagnosis Date  . COPD (chronic obstructive pulmonary disease)     O2 dependent. Followed by Dr. Delford Field  . Fibromyalgia   . Hypertension   . OSA (obstructive sleep apnea)     Sleep study done April 02, 2008 showed severe obstructive sleep apnea.  . Diastolic dysfunction     Grade I diastolic dysfunction as shown on ECHO Dec 2011.  EF of 65-70%.  Marland Kitchen COPD (chronic obstructive pulmonary disease)     Followed by Dr. Delford Field  . Dyslipidemia   . Osteopenia     By DXA scan 10/23/2009.  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 Corliss Skains on 11/10/2004.  MRA of the head on 03/09/2012 showed no evidence for recanalization.  . Osteoarthrosis involving multiple sites   . Chronic back pain   . Depression   . Anxiety   . Anemia   . GERD (gastroesophageal reflux disease)   . Meniere disease     Followed by ENT Dr. Lucky Cowboy  . Diverticulosis of colon   . Multiple pigmented nevi   . Hypokalemia   . S/P hysterectomy 1975  . Pneumonia 11/2004  . Knee pain   . Dysphagia   . Urosepsis 11/04/2010    Klebsiella pneumoniae  . Breast pain   . Allergic rhinitis   . Fibromyalgia   . 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     . Urinary incontinence      Family History  Problem Relation Age of Onset  . Ulcers Mother   . Ovarian cancer Sister 66  . Cervical cancer Daughter   . Cervical cancer Daughter   . Lung cancer Neg Hx   . Colon cancer Neg Hx   . Heart attack Father   . Cerebral aneurysm Sister 58     History   Social History  . Marital Status: Widowed    Spouse Name: N/A    Number of Children: N/A  . Years of Education: N/A   Occupational History  . Not on file.   Social History Main Topics  . Smoking status: Former Smoker -- 0.50 packs/day for 30 years    Types: Cigarettes    Quit date: 12/07/2008  . Smokeless tobacco: Former Neurosurgeon    Types: Chew    Quit date: 12/06/1978  . Alcohol Use: No  . Drug Use: No  . Sexually  Active: No   Other Topics Concern  . Not on file   Social History Narrative   Financial assistance approved for 100% discount after Medicare pays for [facility] only, not eligible for [facility] card. Per Rudell Cobb     Allergies  Allergen Reactions  . Flonase (Fluticasone) Cough  . Lisinopril Other (See Comments)    Cough  . Tetracycline     REACTION: stomach upset     Outpatient Prescriptions Prior to Visit  Medication Sig Dispense Refill  . albuterol (PROAIR HFA) 108 (90 BASE) MCG/ACT inhaler INHALE 2 PUFFS BY MOUTH INTO LUNGS FOUR TIMES DAILY AS NEEDED  8.5 g  1  . ALPRAZolam (XANAX) 0.5 MG tablet Take 1-1.5 tablets (0.5-0.75 mg total) by mouth 3 (three) times daily as needed for anxiety.  120 tablet  0  . amitriptyline (ELAVIL) 150 MG tablet Take 1 tablet (150 mg total) by mouth daily.  90 tablet  1  . aspirin 81 MG EC tablet Take 81 mg by mouth daily.        Marland Kitchen CALCIUM 600+D 600-400 MG-UNIT per tablet TAKE 1  TABLET BY MOUTH TWICE DAILY  60 tablet  6  . carisoprodol (SOMA) 350 MG tablet Take 1 tablet (350 mg total) by mouth 3 (three) times daily as needed for muscle spasms.  90 tablet  2  . guaiFENesin (MUCINEX) 600 MG 12 hr tablet Take 600 mg by mouth 2 (two) times daily.        . hydrochlorothiazide (HYDRODIURIL) 25 MG tablet TAKE ONE TABLET BY MOUTH DAILY  90 tablet  3  . HYDROcodone-acetaminophen (NORCO) 7.5-325 MG per tablet Take 1 tablet by mouth 4 (four) times daily as needed for pain.  120 tablet  1  . hydroxypropyl methylcellulose (ISOPTO TEARS) 2.5 % ophthalmic solution Place 2 drops into both eyes 3 (three) times daily as needed. Dry eye      . ibuprofen (ADVIL,MOTRIN) 200 MG tablet Take 400 mg by mouth 2 (two) times daily as needed. For pain        . meclizine (ANTIVERT) 12.5 MG tablet TAKE 1 TABLET BY MOUTH TWICE DAILY AS NEEDED FOR DIZZINESS  180 tablet  0  . NIFEdipine (ADALAT CC) 90 MG 24 hr tablet TAKE 1 TABLET BY MOUTH EVERY DAY  90 tablet  3  . omeprazole (PRILOSEC) 20 MG capsule TAKE 1 CAPSULE BY MOUTH TWICE DAILY  60 capsule  6  . potassium chloride (KLOR-CON 10) 10 MEQ tablet TAKE 3 TABLETS BY MOUTH EVERY DAY  270 tablet  1  . sertraline (ZOLOFT) 100 MG tablet TAKE 1 TABLET BY MOUTH TWICE DAILY  180 tablet  0  . tiotropium (SPIRIVA HANDIHALER) 18 MCG inhalation capsule PLACE 1 CAPSULE INTO INHALER AND INHALE DAILY  30 capsule  0  . topiramate (TOPAMAX) 50 MG tablet TAKE 1 TABLET BY MOUTH TWICE DAILY  180 tablet  0  . triamcinolone cream (KENALOG) 0.1 % APPLY TOPICALLY TO THE AFFECTED AREA OF HAND TWICE DAILY AS NEEDED  30 g  0  . albuterol (PROAIR HFA) 108 (90 BASE) MCG/ACT inhaler INHALE 2 PUFFS BY MOUTH INTO LUNGS FOUR TIMES DAILY AS NEEDED  3 Inhaler  0  . Fluticasone-Salmeterol (ADVAIR DISKUS) 250-50 MCG/DOSE AEPB Inhale 1 puff into the lungs 2 (two) times daily.  180 each  0  . POLY-IRON 150 150 MG capsule TAKE 1 CAPSULE BY MOUTH TWICE DAILY WITH MEALS  60 capsule  2  .  tiotropium (SPIRIVA HANDIHALER) 18  MCG inhalation capsule Place 1 capsule (18 mcg total) into inhaler and inhale daily.  90 capsule  0  . lidocaine (LIDODERM) 5 % Place 1 patch onto the skin daily. Remove & discard patch within 12 hours or as directed by MD  30 patch  1   No facility-administered medications prior to visit.     Review of Systems  Constitutional:   No  weight loss, night sweats,  Fevers, chills, fatigue, lassitude. HEENT:   No headaches,  Difficulty swallowing,  Tooth/dental problems,  Sore throat,                No sneezing, itching, ear ache, nasal congestion, post nasal drip,   CV:  No chest pain,  Orthopnea, PND, swelling in lower extremities, anasarca, dizziness, palpitations  GI  No heartburn, indigestion, abdominal pain, nausea, vomiting, diarrhea, change in bowel habits, loss of appetite  Resp: Notes shortness of breath with exertion not  at rest.  No excess mucus, no productive cough,  No non-productive cough,  No coughing up of blood.  No change in color of mucus.  No wheezing.  No chest wall deformity  Skin: no rash or lesions.  GU: no dysuria, change in color of urine, no urgency or frequency.  No flank pain.  MS:  No joint pain or swelling.  No decreased range of motion.  No back pain.  Psych:  No change in mood or affect. No depression or anxiety.  No memory loss.     Objective:   Physical Exam  Filed Vitals:   06/15/13 1142  BP: 122/78  Pulse: 90  Temp: 98.3 F (36.8 C)  TempSrc: Oral  Height: 5\' 3"  (1.6 m)  Weight: 253 lb 9.6 oz (115.032 kg)  SpO2: 95%    Gen: Pleasant, well-nourished, in no distress,  normal affect  ENT: No lesions,  mouth clear,  oropharynx clear, no postnasal drip  Neck: No JVD, no TMG, no carotid bruits  Lungs: No use of accessory muscles, no dullness to percussion, distant BS  Cardiovascular: RRR, heart sounds normal, no murmur or gallops, no peripheral edema  Abdomen: soft and NT, no HSM,  BS  normal  Musculoskeletal: No deformities, no cyanosis or clubbing  Neuro: alert, non focal  Skin: Warm, no lesions or rashes        Assessment & Plan:   COPD Gold B Gold B Copd with mild exacerbation d/t recent environmental exposure  Plan Depomedrol 120mg  IM will be given Refills will be issued  Return 1 year      Updated Medication List Outpatient Encounter Prescriptions as of 06/15/2013  Medication Sig Dispense Refill  . albuterol (PROAIR HFA) 108 (90 BASE) MCG/ACT inhaler INHALE 2 PUFFS BY MOUTH INTO LUNGS FOUR TIMES DAILY AS NEEDED  8.5 g  1  . ALPRAZolam (XANAX) 0.5 MG tablet Take 1-1.5 tablets (0.5-0.75 mg total) by mouth 3 (three) times daily as needed for anxiety.  120 tablet  0  . amitriptyline (ELAVIL) 150 MG tablet Take 1 tablet (150 mg total) by mouth daily.  90 tablet  1  . aspirin 81 MG EC tablet Take 81 mg by mouth daily.        Marland Kitchen CALCIUM 600+D 600-400 MG-UNIT per tablet TAKE 1 TABLET BY MOUTH TWICE DAILY  60 tablet  6  . carisoprodol (SOMA) 350 MG tablet Take 1 tablet (350 mg total) by mouth 3 (three) times daily as needed for muscle spasms.  90 tablet  2  .  Fluticasone-Salmeterol (ADVAIR DISKUS) 250-50 MCG/DOSE AEPB Inhale 1 puff into the lungs 2 (two) times daily.  180 each  3  . guaiFENesin (MUCINEX) 600 MG 12 hr tablet Take 600 mg by mouth 2 (two) times daily.        . hydrochlorothiazide (HYDRODIURIL) 25 MG tablet TAKE ONE TABLET BY MOUTH DAILY  90 tablet  3  . HYDROcodone-acetaminophen (NORCO) 7.5-325 MG per tablet Take 1 tablet by mouth 4 (four) times daily as needed for pain.  120 tablet  1  . hydroxypropyl methylcellulose (ISOPTO TEARS) 2.5 % ophthalmic solution Place 2 drops into both eyes 3 (three) times daily as needed. Dry eye      . ibuprofen (ADVIL,MOTRIN) 200 MG tablet Take 400 mg by mouth 2 (two) times daily as needed. For pain        . iron polysaccharides (POLY-IRON 150) 150 MG capsule       . meclizine (ANTIVERT) 12.5 MG tablet TAKE 1  TABLET BY MOUTH TWICE DAILY AS NEEDED FOR DIZZINESS  180 tablet  0  . NIFEdipine (ADALAT CC) 90 MG 24 hr tablet TAKE 1 TABLET BY MOUTH EVERY DAY  90 tablet  3  . omeprazole (PRILOSEC) 20 MG capsule TAKE 1 CAPSULE BY MOUTH TWICE DAILY  60 capsule  6  . potassium chloride (KLOR-CON 10) 10 MEQ tablet TAKE 3 TABLETS BY MOUTH EVERY DAY  270 tablet  1  . sertraline (ZOLOFT) 100 MG tablet TAKE 1 TABLET BY MOUTH TWICE DAILY  180 tablet  0  . tiotropium (SPIRIVA HANDIHALER) 18 MCG inhalation capsule PLACE 1 CAPSULE INTO INHALER AND INHALE DAILY  30 capsule  0  . topiramate (TOPAMAX) 50 MG tablet TAKE 1 TABLET BY MOUTH TWICE DAILY  180 tablet  0  . triamcinolone cream (KENALOG) 0.1 % APPLY TOPICALLY TO THE AFFECTED AREA OF HAND TWICE DAILY AS NEEDED  30 g  0  . [DISCONTINUED] albuterol (PROAIR HFA) 108 (90 BASE) MCG/ACT inhaler INHALE 2 PUFFS BY MOUTH INTO LUNGS FOUR TIMES DAILY AS NEEDED  3 Inhaler  0  . [DISCONTINUED] Fluticasone-Salmeterol (ADVAIR DISKUS) 250-50 MCG/DOSE AEPB Inhale 1 puff into the lungs 2 (two) times daily.  180 each  0  . [DISCONTINUED] POLY-IRON 150 150 MG capsule TAKE 1 CAPSULE BY MOUTH TWICE DAILY WITH MEALS  60 capsule  2  . [DISCONTINUED] tiotropium (SPIRIVA HANDIHALER) 18 MCG inhalation capsule Place 1 capsule (18 mcg total) into inhaler and inhale daily.  90 capsule  0  . [DISCONTINUED] lidocaine (LIDODERM) 5 % Place 1 patch onto the skin daily. Remove & discard patch within 12 hours or as directed by MD  30 patch  1  . [EXPIRED] methylPREDNISolone acetate (DEPO-MEDROL) injection 120 mg        No facility-administered encounter medications on file as of 06/15/2013.

## 2013-06-16 NOTE — Assessment & Plan Note (Addendum)
Gold B Copd with mild exacerbation d/t recent environmental exposure  Plan Depomedrol 120mg  IM will be given Refills will be issued  Return 1 year

## 2013-06-18 ENCOUNTER — Other Ambulatory Visit: Payer: Self-pay | Admitting: *Deleted

## 2013-06-18 MED ORDER — CARISOPRODOL 350 MG PO TABS: 350.0000 mg | ORAL_TABLET | Freq: Three times a day (TID) | ORAL | Status: DC | PRN

## 2013-06-18 MED ORDER — ALPRAZOLAM 0.5 MG PO TABS: 0.5000 mg | ORAL_TABLET | Freq: Three times a day (TID) | ORAL | Status: DC | PRN

## 2013-06-18 NOTE — Telephone Encounter (Signed)
Refill approved - nurse to call in. 

## 2013-06-19 NOTE — Telephone Encounter (Signed)
Both Rx called in to pharmacy. Daughter aware.

## 2013-06-26 ENCOUNTER — Other Ambulatory Visit: Payer: Self-pay | Admitting: *Deleted

## 2013-06-26 NOTE — Telephone Encounter (Signed)
Need  Written Rx

## 2013-06-27 MED ORDER — HYDROCODONE-ACETAMINOPHEN 7.5-325 MG PO TABS: 1.0000 | ORAL_TABLET | Freq: Four times a day (QID) | ORAL | Status: DC | PRN

## 2013-06-27 NOTE — Telephone Encounter (Signed)
Rx called in and will be shipped in one month.

## 2013-06-27 NOTE — Telephone Encounter (Signed)
Refill approved - as per your phone call to [facility], this can be phoned in.

## 2013-07-11 ENCOUNTER — Other Ambulatory Visit: Payer: Self-pay | Admitting: *Deleted

## 2013-07-11 ENCOUNTER — Other Ambulatory Visit: Payer: Self-pay | Admitting: Internal Medicine

## 2013-07-11 MED ORDER — TOPIRAMATE 50 MG PO TABS: ORAL_TABLET | ORAL | Status: DC

## 2013-07-13 ENCOUNTER — Other Ambulatory Visit: Payer: Self-pay | Admitting: Pulmonary Disease

## 2013-07-13 MED ORDER — TIOTROPIUM BROMIDE MONOHYDRATE 18 MCG IN CAPS: ORAL_CAPSULE | RESPIRATORY_TRACT | Status: DC

## 2013-08-15 ENCOUNTER — Encounter: Payer: Self-pay | Admitting: Internal Medicine

## 2013-08-15 ENCOUNTER — Other Ambulatory Visit: Payer: Self-pay | Admitting: *Deleted

## 2013-08-15 ENCOUNTER — Ambulatory Visit (INDEPENDENT_AMBULATORY_CARE_PROVIDER_SITE_OTHER): Payer: Medicare HMO | Admitting: Internal Medicine

## 2013-08-15 VITALS — BP 120/77 | HR 73 | Temp 97.5°F | Ht 63.0 in | Wt 250.4 lb

## 2013-08-15 DIAGNOSIS — J4489 Other specified chronic obstructive pulmonary disease: Secondary | ICD-10-CM

## 2013-08-15 DIAGNOSIS — K219 Gastro-esophageal reflux disease without esophagitis: Secondary | ICD-10-CM

## 2013-08-15 DIAGNOSIS — D649 Anemia, unspecified: Secondary | ICD-10-CM

## 2013-08-15 DIAGNOSIS — M797 Fibromyalgia: Secondary | ICD-10-CM

## 2013-08-15 DIAGNOSIS — Z23 Encounter for immunization: Secondary | ICD-10-CM

## 2013-08-15 DIAGNOSIS — L309 Dermatitis, unspecified: Secondary | ICD-10-CM

## 2013-08-15 DIAGNOSIS — IMO0001 Reserved for inherently not codable concepts without codable children: Secondary | ICD-10-CM

## 2013-08-15 DIAGNOSIS — E785 Hyperlipidemia, unspecified: Secondary | ICD-10-CM

## 2013-08-15 DIAGNOSIS — L259 Unspecified contact dermatitis, unspecified cause: Secondary | ICD-10-CM

## 2013-08-15 DIAGNOSIS — J449 Chronic obstructive pulmonary disease, unspecified: Secondary | ICD-10-CM

## 2013-08-15 DIAGNOSIS — I10 Essential (primary) hypertension: Secondary | ICD-10-CM

## 2013-08-15 LAB — CBC WITH DIFFERENTIAL/PLATELET
Basophils Absolute: 0.1 10*3/uL (ref 0.0–0.1)
HCT: 36 % (ref 36.0–46.0)
Lymphocytes Relative: 26 % (ref 12–46)
Neutro Abs: 7.2 10*3/uL (ref 1.7–7.7)
Platelets: 314 10*3/uL (ref 150–400)
RDW: 13.5 % (ref 11.5–15.5)
WBC: 11.3 10*3/uL — ABNORMAL HIGH (ref 4.0–10.5)

## 2013-08-15 LAB — COMPLETE METABOLIC PANEL WITH GFR
ALT: 20 U/L (ref 0–35)
Albumin: 3.7 g/dL (ref 3.5–5.2)
CO2: 29 mEq/L (ref 19–32)
GFR, Est African American: >89 mL/min
Potassium: 3.4 mEq/L — ABNORMAL LOW (ref 3.5–5.3)
Sodium: 131 mEq/L — ABNORMAL LOW (ref 135–145)
Total Bilirubin: 0.2 mg/dL — ABNORMAL LOW (ref 0.3–1.2)
Total Protein: 6.2 g/dL (ref 6.0–8.3)

## 2013-08-15 LAB — LIPID PANEL
HDL: 65 mg/dL (ref >39)
LDL Cholesterol: 110 mg/dL — ABNORMAL HIGH (ref 0–99)
Total CHOL/HDL Ratio: 3.3 Ratio

## 2013-08-15 MED ORDER — RANITIDINE HCL 150 MG PO TABS: 150.0000 mg | ORAL_TABLET | Freq: Every day | ORAL | Status: DC

## 2013-08-15 MED ORDER — TRIAMCINOLONE ACETONIDE 0.1 % EX CREA: TOPICAL_CREAM | CUTANEOUS | Status: DC

## 2013-08-15 MED ORDER — CARISOPRODOL 350 MG PO TABS: 350.0000 mg | ORAL_TABLET | Freq: Three times a day (TID) | ORAL | Status: DC | PRN

## 2013-08-15 MED ORDER — NYSTATIN 100000 UNIT/GM EX POWD: CUTANEOUS | Status: DC

## 2013-08-15 MED ORDER — SERTRALINE HCL 100 MG PO TABS: 100.0000 mg | ORAL_TABLET | Freq: Two times a day (BID) | ORAL | Status: DC

## 2013-08-15 MED ORDER — POLYSACCHARIDE IRON COMPLEX 150 MG PO CAPS: 150.0000 mg | ORAL_CAPSULE | Freq: Every day | ORAL | Status: DC

## 2013-08-15 NOTE — Progress Notes (Signed)
  Subjective:    Patient ID: Jennifer [name], female    DOB: [birth year], 72 y.o.   MRN: [id number]  HPI Patient returns for followup of her dyslipidemia, COPD, hypertension, and other chronic medical problems.  Today she has several recent complaints.  She reports a dry cough for the past 2 weeks which is intermittent; she has had occasional right chest tightness.  She denies fever, chills, sweats, shortness of breath, or a productive cough.  She has had a recurrence of some patches of eczematous rash on her left palm and the dorsum of her right hand, as well as her occipital scalp; she requested a refill of her triamcinolone cream which has worked in the past.  She has chronic headaches which are being managed by her neurologist Dr. Pearlean Brownie; she is on topiramate but says it is not preventing her headaches.  She has an area of intertrigal rash in her left axilla.  She reports increased heartburn for the past week with some feeling of bloating and mild nausea; her symptoms have responded to the addition of ranitidine one tablet daily as needed.  She saw her pulmonologist Dr. Delford Field in July; at that time, she had somewhat worsened shortness of breath and he treated her with a steroid injection; her symptoms subsequently improved, and she does not have shortness of breath today.  She is on home CPAP and reports that she wears this regularly.   Review of Systems  Constitutional: Negative for fever, chills and diaphoresis.  Respiratory: Positive for cough (Nonproductive cough for about 2 weeks.) and chest tightness (Occasional). Negative for shortness of breath.   Cardiovascular: Negative for chest pain.  Gastrointestinal: Negative for nausea, vomiting and abdominal pain.  Genitourinary: Negative for dysuria.  Skin: Positive for rash (Scaling patch on left palm, a few spots on dorsum of right hand).  Neurological: Positive for headaches (Chronic; on Topiramate per Dr. Pearlean Brownie). Negative for speech difficulty and  weakness.       Objective:   Physical Exam  Constitutional: No distress.  Cardiovascular: Normal rate, regular rhythm and normal heart sounds.  Exam reveals no gallop and no friction rub.   No murmur heard. Pulmonary/Chest: Effort normal and breath sounds normal. No respiratory distress. She has no wheezes.  Abdominal: Soft. Bowel sounds are normal. She exhibits no distension. There is no tenderness. There is no rebound and no guarding.  Musculoskeletal: She exhibits no edema.  Skin:          Assessment & Plan:

## 2013-08-15 NOTE — Patient Instructions (Addendum)
General Instructions: Start ranitidine 150 mg one tablet each evening. Please keep your appointment for screening mammogram. Please complete and return Hemoccult cards as instructed. Call or return if your cough persists or worsens.   Progress Toward Treatment Goals:  Treatment Goal 08/15/2013  Blood pressure at goal    Self Care Goals & Plans:  Self Care Goal 08/15/2013  Monitor my health -  Eat healthy foods -  Be physically active -  Meeting treatment goals maintain the current self-care plan       Care Management & Community Referrals:  Referral 08/15/2013  Referrals made for care management support none needed  Referrals made to community resources none

## 2013-08-16 NOTE — Assessment & Plan Note (Signed)
BP Readings from Last 3 Encounters:  08/15/13 120/77  06/15/13 122/78  02/07/13 120/80    Assessment: Blood pressure control: controlled Progress toward BP goal:  at goal Comments: Blood pressure is controlled on hydrochlorothiazide 25 mg daily and Adalat CC (nifedipine) 90 mg daily  Plan: Medications:  continue current medications

## 2013-08-16 NOTE — Assessment & Plan Note (Signed)
Assessment: Patient is currently on no medication; she was intolerant of rosuvastatin in the past due to muscle aches.  Plan: Will check a comprehensive metabolic panel and a lipid panel today.

## 2013-08-16 NOTE — Assessment & Plan Note (Signed)
Assessment: Patient has areas of recurrence of her hand eczema; she reports a good response previously to triamcinolone 0.1% cream twice a day.  She also has has a small patch on her occipital scalp.  Plan: Refill 0.1% triamcinolone cream topical twice a day for 2 weeks.

## 2013-08-16 NOTE — Assessment & Plan Note (Signed)
Assessment: Patient is doing well on current regimen.  Plan: Continue current regimen.  I advised her to try decreasing her carisoprodol to twice a day to see if she has any increase in symptoms.

## 2013-08-16 NOTE — Assessment & Plan Note (Signed)
Assessment: Patient has no symptoms of anemia.  She reports that she has been compliant with her iron supplement once daily.   Plan: Check a CBC and ferritin level.

## 2013-08-16 NOTE — Assessment & Plan Note (Signed)
Assessment: Patient reports some increase in symptoms, which responded to the addition of a daily dose of ranitidine to her omeprazole 20 mg twice a day.  Plan: Continue omeprazole 20 mg twice a day, and add ranitidine 150 mg each evening.

## 2013-08-16 NOTE — Assessment & Plan Note (Signed)
Assessment: Patient is doing well and reports that she is using her inhaled bronchodilators as prescribed (Advair Diskus 250-50 one puff twice a day, Pro-air inhaler 2 puffs 4 times a day as needed, and Spiriva one inhalation daily)  Plan: Continue current regimen; patient will followup with her pulmonologist Dr. Delford Field periodically as advised.

## 2013-08-17 ENCOUNTER — Other Ambulatory Visit: Payer: Self-pay | Admitting: Internal Medicine

## 2013-08-17 ENCOUNTER — Other Ambulatory Visit: Payer: Self-pay | Admitting: *Deleted

## 2013-08-17 DIAGNOSIS — E876 Hypokalemia: Secondary | ICD-10-CM | POA: Insufficient documentation

## 2013-08-17 MED ORDER — POTASSIUM CHLORIDE ER 10 MEQ PO TBCR: 40.0000 meq | EXTENDED_RELEASE_TABLET | Freq: Every day | ORAL | Status: DC

## 2013-08-17 MED ORDER — ALPRAZOLAM 0.5 MG PO TABS: 0.5000 mg | ORAL_TABLET | Freq: Three times a day (TID) | ORAL | Status: DC | PRN

## 2013-08-17 MED ORDER — HYDROCODONE-ACETAMINOPHEN 7.5-325 MG PO TABS: 1.0000 | ORAL_TABLET | Freq: Four times a day (QID) | ORAL | Status: DC | PRN

## 2013-08-17 NOTE — Telephone Encounter (Signed)
Last refill of alprazolam at [facility] 03/28/13 and at [facility] 05/30/13

## 2013-08-17 NOTE — Telephone Encounter (Signed)
Refills approved; nurse to call in the alprazolam, to be refilled when due one month after her last dispense.

## 2013-08-17 NOTE — Progress Notes (Signed)
Quick Note:  LDL is much better on dietary management alone; patient has been intolerant of statin medications in the past. I called her and informed her of the improved result. ______

## 2013-08-17 NOTE — Progress Notes (Signed)
Quick Note:  WBC is mildly elevated; patient is asymptomatic, and I think this is likely a benign finding. I called patient and discussed it with her. The plan is to recheck a CBC with differential at next visit. ______

## 2013-08-17 NOTE — Telephone Encounter (Signed)
Rx called in 

## 2013-08-17 NOTE — Telephone Encounter (Signed)
Refill approved for refill to be dispensed when due (30 days after her last refill) - nurse to call in.

## 2013-08-17 NOTE — Progress Notes (Signed)
Quick Note:  Potassium is mildly low; I called patient and she confirmed that she is taking potassium chloride 10 mEq 3 tablets daily for a total daily dose of 30 mEq daily. Plan is to increase to 4 tablets daily for a total daily dose of 40 mEq daily; patient will return in about 3 weeks to check potassium level. ______

## 2013-08-20 NOTE — Telephone Encounter (Signed)
Rx called in to pharmacy. Daughter aware.

## 2013-08-29 ENCOUNTER — Other Ambulatory Visit: Payer: Self-pay | Admitting: Internal Medicine

## 2013-08-29 MED ORDER — CARISOPRODOL 350 MG PO TABS: 350.0000 mg | ORAL_TABLET | Freq: Three times a day (TID) | ORAL | Status: DC | PRN

## 2013-08-29 NOTE — Progress Notes (Signed)
Rx faxed

## 2013-08-29 NOTE — Progress Notes (Signed)
Carisoprodol refill was not called in on 9/10; I reordered this, printed, and signed it for nurse to fax to [facility] pharmacy.

## 2013-09-04 ENCOUNTER — Other Ambulatory Visit (INDEPENDENT_AMBULATORY_CARE_PROVIDER_SITE_OTHER): Payer: Medicare HMO

## 2013-09-04 ENCOUNTER — Other Ambulatory Visit: Payer: Medicare HMO

## 2013-09-04 DIAGNOSIS — E876 Hypokalemia: Secondary | ICD-10-CM

## 2013-09-04 LAB — BASIC METABOLIC PANEL WITH GFR
GFR, Est African American: >89 mL/min
GFR, Est Non African American: 88 mL/min
Potassium: 3.6 mEq/L (ref 3.5–5.3)
Sodium: 131 mEq/L — ABNORMAL LOW (ref 135–145)

## 2013-09-08 ENCOUNTER — Other Ambulatory Visit: Payer: Self-pay | Admitting: Neurology

## 2013-09-11 ENCOUNTER — Ambulatory Visit (INDEPENDENT_AMBULATORY_CARE_PROVIDER_SITE_OTHER): Payer: Medicare HMO | Admitting: Internal Medicine

## 2013-09-11 ENCOUNTER — Encounter: Payer: Self-pay | Admitting: Internal Medicine

## 2013-09-11 VITALS — BP 126/79 | HR 84 | Temp 97.5°F | Ht 63.0 in | Wt 251.8 lb

## 2013-09-11 DIAGNOSIS — R059 Cough, unspecified: Secondary | ICD-10-CM

## 2013-09-11 DIAGNOSIS — Z0000 Encounter for general adult medical examination without abnormal findings: Secondary | ICD-10-CM

## 2013-09-11 DIAGNOSIS — R053 Chronic cough: Secondary | ICD-10-CM

## 2013-09-11 DIAGNOSIS — R05 Cough: Secondary | ICD-10-CM | POA: Insufficient documentation

## 2013-09-11 DIAGNOSIS — Z8049 Family history of malignant neoplasm of other genital organs: Secondary | ICD-10-CM

## 2013-09-11 DIAGNOSIS — Z1239 Encounter for other screening for malignant neoplasm of breast: Secondary | ICD-10-CM

## 2013-09-11 DIAGNOSIS — Z1211 Encounter for screening for malignant neoplasm of colon: Secondary | ICD-10-CM

## 2013-09-11 DIAGNOSIS — I10 Essential (primary) hypertension: Secondary | ICD-10-CM

## 2013-09-11 MED ORDER — GUAIFENESIN 100 MG/5ML PO SYRP: 200.0000 mg | ORAL_SOLUTION | Freq: Three times a day (TID) | ORAL | Status: DC | PRN

## 2013-09-11 NOTE — Patient Instructions (Signed)
General Instructions: We are providing Occult Blood cards as screening for colon cancer.  Please fill out and mail back to our clinic.  We are scheduling you for a Mammogram.  Please return for a follow-up visit as scheduled with Dr. Meredith Pel.   Treatment Goals:  Goals (1 Years of Data) as of 09/11/13         As of Today 08/15/13 06/15/13 02/07/13 02/07/13     Blood Pressure    . Blood Pressure < 140/90  126/79 120/77 122/78 120/80 120/80     Result Component    . LDL CALC < 130   110  153       Progress Toward Treatment Goals:  Treatment Goal 09/11/2013  Blood pressure at goal    Self Care Goals & Plans:  Self Care Goal 09/11/2013  Manage my medications take my medicines as prescribed; bring my medications to every visit; refill my medications on time  Monitor my health keep track of my weight  Eat healthy foods eat foods that are low in salt  Be physically active take a walk every day  Meeting treatment goals -       Care Management & Community Referrals:  Referral 09/11/2013  Referrals made for care management support none needed  Referrals made to community resources -

## 2013-09-11 NOTE — Progress Notes (Signed)
HPI The patient is a 72 y.o. female with a history of , COPD, hypertension, OSA, presenting for a followup visit.  At the patient's last visit, she noted a chronic cough. However, since starting Vicodin, the patient notes near resolution of the cough. She asks what she can take if she still has some breakthrough cough.   The patient has a history of a mother with ovarian cancer, sister with cervical cancer at age 71.  No family history of breast cancer.  The patient requests mammography.  ROS: General: no fevers, chills, changes in weight, changes in appetite Skin: no rash HEENT: no blurry vision, hearing changes, sore throat Pulm: no dyspnea, coughing, wheezing CV: no chest pain, palpitations, shortness of breath Abd: no abdominal pain, nausea/vomiting, diarrhea/constipation GU: no dysuria, hematuria, polyuria Ext: no arthralgias, myalgias Neuro: no weakness, numbness, or tingling  Filed Vitals:   09/11/13 1518  BP: 126/79  Pulse: 84  Temp: 97.5 F (36.4 C)    PEX General: alert, cooperative, and in no apparent distress HEENT: pupils equal round and reactive to light, vision grossly intact, oropharynx clear and non-erythematous  Neck: supple, no lymphadenopathy Lungs: clear to ascultation bilaterally, normal work of respiration, no wheezes, rales, ronchi Heart: regular rate and rhythm, no murmurs, gallops, or rubs Abdomen: soft, non-tender, non-distended, normal bowel sounds Extremities: trace bilateral non-pitting edema Neurologic: alert & oriented X3, cranial nerves II-XII intact, strength grossly intact, sensation intact to light touch  Current Outpatient Prescriptions on File Prior to Visit  Medication Sig Dispense Refill  . albuterol (PROAIR HFA) 108 (90 BASE) MCG/ACT inhaler INHALE 2 PUFFS BY MOUTH INTO LUNGS FOUR TIMES DAILY AS NEEDED  8.5 g  1  . ALPRAZolam (XANAX) 0.5 MG tablet Take 1-1.5 tablets (0.5-0.75 mg total) by mouth 3 (three) times daily as needed for  anxiety.  120 tablet  2  . amitriptyline (ELAVIL) 150 MG tablet TAKE 1 TABLET EVERY DAY  90 tablet  0  . aspirin 81 MG EC tablet Take 81 mg by mouth daily.        Marland Kitchen CALCIUM 600+D 600-400 MG-UNIT per tablet TAKE 1 TABLET BY MOUTH TWICE DAILY  60 tablet  6  . carisoprodol (SOMA) 350 MG tablet Take 1 tablet (350 mg total) by mouth 3 (three) times daily as needed for muscle spasms.  90 tablet  2  . Fluticasone-Salmeterol (ADVAIR DISKUS) 250-50 MCG/DOSE AEPB Inhale 1 puff into the lungs 2 (two) times daily.  180 each  3  . guaiFENesin (MUCINEX) 600 MG 12 hr tablet Take 600 mg by mouth 2 (two) times daily.        . hydrochlorothiazide (HYDRODIURIL) 25 MG tablet TAKE ONE TABLET BY MOUTH DAILY  90 tablet  3  . HYDROcodone-acetaminophen (NORCO) 7.5-325 MG per tablet Take 1 tablet by mouth 4 (four) times daily as needed for pain.  120 tablet  1  . hydroxypropyl methylcellulose (ISOPTO TEARS) 2.5 % ophthalmic solution Place 2 drops into both eyes 3 (three) times daily as needed. Dry eye      . ibuprofen (ADVIL,MOTRIN) 200 MG tablet Take 400 mg by mouth 2 (two) times daily as needed. For pain        . iron polysaccharides (POLY-IRON 150) 150 MG capsule Take 1 capsule (150 mg total) by mouth daily.  90 capsule  1  . meclizine (ANTIVERT) 12.5 MG tablet TAKE 1 TABLET BY MOUTH TWICE DAILY AS NEEDED FOR DIZZINESS  180 tablet  0  . NIFEdipine (ADALAT CC)  90 MG 24 hr tablet TAKE 1 TABLET BY MOUTH EVERY DAY  90 tablet  3  . nystatin (MYCOSTATIN/NYSTOP) 100000 UNIT/GM POWD Apply topically twice a day to rash under arm for 2 weeks.  15 g  1  . omeprazole (PRILOSEC) 20 MG capsule TAKE 1 CAPSULE BY MOUTH TWICE DAILY  60 capsule  6  . potassium chloride (KLOR-CON 10) 10 MEQ tablet Take 4 tablets (40 mEq total) by mouth daily.  120 tablet  1  . ranitidine (ZANTAC) 150 MG tablet Take 1 tablet (150 mg total) by mouth at bedtime.  90 tablet  1  . sertraline (ZOLOFT) 100 MG tablet Take 1 tablet (100 mg total) by mouth 2 (two)  times daily.  180 tablet  2  . tiotropium (SPIRIVA HANDIHALER) 18 MCG inhalation capsule PLACE 1 CAPSULE INTO INHALER AND INHALE DAILY  90 capsule  1  . topiramate (TOPAMAX) 50 MG tablet TAKE 1 TABLET BY MOUTH TWICE DAILY  180 tablet  0  . topiramate (TOPAMAX) 50 MG tablet TAKE 1 TABLET BY MOUTH AT NIGHT FOR 1 WEEK, THEN TWICE DAILY THEREAFTER  60 tablet  0  . triamcinolone cream (KENALOG) 0.1 % APPLY TOPICALLY TO THE AFFECTED AREA OF HAND TWICE DAILY AS NEEDED  45 g  1   No current facility-administered medications on file prior to visit.    Assessment/Plan

## 2013-09-11 NOTE — Assessment & Plan Note (Signed)
BP Readings from Last 3 Encounters:  09/11/13 126/79  08/15/13 120/77  06/15/13 122/78    Lab Results  Component Value Date   NA 131* 09/04/2013   K 3.6 09/04/2013   CREATININE 0.64 09/04/2013    Assessment: Blood pressure control: controlled Progress toward BP goal:  at goal Comments: BP well-controlled  Plan: Medications:  continue current medications Educational resources provided:   Self management tools provided: home blood pressure logbook Other plans: Recheck at next visit

## 2013-09-11 NOTE — Assessment & Plan Note (Signed)
The patient has a history of chronic cough. Since starting Vicodin, the patient notes near resolution of this cough. -pt may use guaifenesin syrup prn for residual cough.  Pt cautioned against using both tablet and syrup form at the same time.

## 2013-09-11 NOTE — Assessment & Plan Note (Signed)
-  We discussed at length the risks and benefits of mammography given the patient's age. After our discussion, the patient expressed a preference to continue yearly mammograms at this time.  This issue can be addressed again with the patient's PCP in the future. -Fecal occult blood cards given

## 2013-09-13 NOTE — Progress Notes (Signed)
Case discussed with Dr. Brown at the time of the visit.  We reviewed the resident's history and exam and pertinent patient test results.  I agree with the assessment, diagnosis, and plan of care documented in the resident's note. 

## 2013-09-15 ENCOUNTER — Other Ambulatory Visit: Payer: Self-pay | Admitting: Internal Medicine

## 2013-09-18 ENCOUNTER — Ambulatory Visit (HOSPITAL_COMMUNITY): Payer: Medicare HMO

## 2013-09-26 ENCOUNTER — Ambulatory Visit (HOSPITAL_COMMUNITY): Payer: Medicare HMO

## 2013-10-03 ENCOUNTER — Other Ambulatory Visit: Payer: Self-pay | Admitting: *Deleted

## 2013-10-03 NOTE — Telephone Encounter (Signed)
Need hard copy rx.  thanks

## 2013-10-04 MED ORDER — HYDROCODONE-ACETAMINOPHEN 7.5-325 MG PO TABS: 1.0000 | ORAL_TABLET | Freq: Four times a day (QID) | ORAL | Status: DC | PRN

## 2013-10-04 NOTE — Telephone Encounter (Signed)
I printed and signed 3 prescriptions for refill of hydrocodone-acetaminophen 7.5-325 mg one tablet four times a day as needed for pain, #120 as follows:   A prescription to be provided to patient for refill 10/04/2013.  A prescription to be filled no earlier than 11/03/2013, to be held on file in the clinic and provided to patient in one month if still appropriate.  A prescription to be filled no earlier than 12/03/2013, to be held on file in the clinic and provided to patient in two months if still appropriate.   Prescriptions were given to nurse.

## 2013-10-04 NOTE — Telephone Encounter (Signed)
Pt informed Rx is ready 

## 2013-10-31 ENCOUNTER — Encounter: Payer: Self-pay | Admitting: Internal Medicine

## 2013-10-31 ENCOUNTER — Ambulatory Visit (INDEPENDENT_AMBULATORY_CARE_PROVIDER_SITE_OTHER): Payer: Medicare HMO | Admitting: Internal Medicine

## 2013-10-31 VITALS — BP 117/71 | HR 75 | Temp 97.2°F | Wt 247.0 lb

## 2013-10-31 DIAGNOSIS — F329 Major depressive disorder, single episode, unspecified: Secondary | ICD-10-CM

## 2013-10-31 DIAGNOSIS — I10 Essential (primary) hypertension: Secondary | ICD-10-CM

## 2013-10-31 DIAGNOSIS — E785 Hyperlipidemia, unspecified: Secondary | ICD-10-CM

## 2013-10-31 DIAGNOSIS — D72829 Elevated white blood cell count, unspecified: Secondary | ICD-10-CM

## 2013-10-31 DIAGNOSIS — R600 Localized edema: Secondary | ICD-10-CM

## 2013-10-31 DIAGNOSIS — R609 Edema, unspecified: Secondary | ICD-10-CM

## 2013-10-31 DIAGNOSIS — R7309 Other abnormal glucose: Secondary | ICD-10-CM

## 2013-10-31 DIAGNOSIS — J449 Chronic obstructive pulmonary disease, unspecified: Secondary | ICD-10-CM

## 2013-10-31 DIAGNOSIS — E876 Hypokalemia: Secondary | ICD-10-CM

## 2013-10-31 LAB — CBC WITH DIFFERENTIAL/PLATELET
Basophils Relative: 0 % (ref 0–1)
Hemoglobin: 13 g/dL (ref 12.0–15.0)
Lymphs Abs: 3.1 10*3/uL (ref 0.7–4.0)
MCHC: 34.8 g/dL (ref 30.0–36.0)
Monocytes Relative: 7 % (ref 3–12)
Neutro Abs: 5.1 10*3/uL (ref 1.7–7.7)
Neutrophils Relative %: 57 % (ref 43–77)
RBC: 3.84 MIL/uL — ABNORMAL LOW (ref 3.87–5.11)

## 2013-10-31 LAB — BASIC METABOLIC PANEL WITH GFR
Chloride: 95 mEq/L — ABNORMAL LOW (ref 96–112)
GFR, Est Non African American: >89 mL/min
Potassium: 3.2 mEq/L — ABNORMAL LOW (ref 3.5–5.3)

## 2013-10-31 LAB — POCT GLYCOSYLATED HEMOGLOBIN (HGB A1C): Hemoglobin A1C: 5.8

## 2013-10-31 LAB — GLUCOSE, CAPILLARY: Glucose-Capillary: 115 mg/dL — ABNORMAL HIGH (ref 70–99)

## 2013-10-31 MED ORDER — HYDROCHLOROTHIAZIDE 25 MG PO TABS: 12.5000 mg | ORAL_TABLET | Freq: Every day | ORAL | Status: DC

## 2013-10-31 MED ORDER — ALPRAZOLAM 0.5 MG PO TABS: 0.5000 mg | ORAL_TABLET | Freq: Three times a day (TID) | ORAL | Status: DC | PRN

## 2013-10-31 MED ORDER — AMITRIPTYLINE HCL 100 MG PO TABS: 100.0000 mg | ORAL_TABLET | Freq: Every day | ORAL | Status: DC

## 2013-10-31 MED ORDER — POTASSIUM CHLORIDE ER 10 MEQ PO TBCR: EXTENDED_RELEASE_TABLET | ORAL | Status: DC

## 2013-10-31 NOTE — Assessment & Plan Note (Signed)
Lipids:    Component Value Date/Time   CHOL 215* 08/15/2013 1232   TRIG 200* 08/15/2013 1232   HDL 65 08/15/2013 1232   LDLCALC 110* 08/15/2013 1232   VLDL 40 08/15/2013 1232   CHOLHDL 3.3 08/15/2013 1232    Assessment: Patient is currently on no medication; she was intolerant of rosuvastatin in the past due to muscle aches.  Recent lipid panel showed definite improvement in her LDL with therapeutic lifestyle changes.  Plan: Continue lifestyle management given past intolerance of statin medications and the improvement in her LDL.

## 2013-10-31 NOTE — Assessment & Plan Note (Signed)
WBC  Date Value Range Status  10/31/2013 8.9  4.0 - 10.5 K/uL Final  08/15/2013 11.3* 4.0 - 10.5 K/uL Final  02/07/2013 8.0  4.0 - 10.5 K/uL Final     Assessment: Patient's white blood count was mildly elevated at 11.3 in September; a repeat CBC today showed a normal white blood count of 8.9.  Plan: No further evaluation is indicated at this time.

## 2013-10-31 NOTE — Progress Notes (Signed)
Rx for Xanax was faxed to [facility]

## 2013-10-31 NOTE — Assessment & Plan Note (Signed)
Glucose, Bld  Date Value Range Status  10/31/2013 117* 70 - 99 mg/dL Final  1/61/0960 454* 70 - 99 mg/dL Final  0/98/1191 71  70 - 99 mg/dL Final     Hemoglobin Y7W  Date Value Range Status  10/31/2013 5.8   Final  06/14/2011 6.0* <5.7 % Final  11/18/2010 5.5   Final     Assessment: Patient has mild hyperglycemia, but her hemoglobin A1c is not indicative of diabetes.  I discussed this at length with her today, including the need for lifestyle and dietary management and close monitoring of her blood sugar and hemoglobin A1c.  Plan: I advised patient to avoid concentrated sweets in beverages and desserts.  Will recheck A1c and blood glucose upon return, and consider at that time referral to to our diabetes educator.

## 2013-10-31 NOTE — Progress Notes (Signed)
   Subjective:    Patient ID: Jennifer [name], female    DOB: 1941-01-27, 72 y.o.   MRN: [id number]  HPI Patient returns for followup of her hypertension, chronic right leg edema, chronic back pain, and other medical problems.  Today she reports that she has been doing reasonably well; she has no acute complaints.  She reports that she has been compliant with her medications.  She has tried reducing her carisoprodol to twice a day and done reasonably well, occasionally requiring a third dose.  She has no symptoms of depression on her current medication regimen.  When I reviewed her medications, she voiced the concern that her topiramate, which was prescribed by her neurologist Dr. Pearlean Brownie, might be causing a dull headache rather than alleviating her headaches.  Cell phone: [phone]  Current medications, past medical history, past surgical history, family history, and social history were reviewed and updated.  Review of Systems  Constitutional: Negative for fever and chills.  Respiratory: Negative for shortness of breath and wheezing.   Cardiovascular: Positive for leg swelling (Mild right leg edema, chronic). Negative for chest pain.  Gastrointestinal: Negative for nausea, vomiting and abdominal pain.  Musculoskeletal: Positive for back pain (Chronic, stable).       Objective:   Physical Exam  Constitutional: No distress.  Cardiovascular: Normal rate and regular rhythm.  Exam reveals no gallop and no friction rub.   No murmur heard. 1+ right leg edema, trace left leg edema  Pulmonary/Chest: Effort normal and breath sounds normal. No respiratory distress. She has no wheezes. She has no rales.  Abdominal: Soft. Bowel sounds are normal. She exhibits no distension. There is no tenderness. There is no rebound and no guarding.        Assessment & Plan:

## 2013-10-31 NOTE — Assessment & Plan Note (Signed)
Assessment: Patient reports that her depression is well controlled on her current regimen of amitriptyline 150 mg daily and sertraline 100 mg twice a day.  Given her age and the potential for side effects of amitriptyline, I discussed with her the option of reducing her amitriptyline dose to 100 mg daily and she supports that plan.    Plan: Decrease amitriptyline to a dose of 100 mg daily at bedtime; continue sertraline 100 mg twice a day.  I advised patient to call immediately if she has any symptoms of depression following the decrease in her amitriptyline dose

## 2013-10-31 NOTE — Assessment & Plan Note (Signed)
Potassium  Date Value Range Status  10/31/2013 3.2* 3.5 - 5.3 mEq/L Final  09/04/2013 3.6  3.5 - 5.3 mEq/L Final  08/15/2013 3.4* 3.5 - 5.3 mEq/L Final     Assessment: Potassium is again low at 3.2 on potassium chloride 40 mEq daily supplement, likely due to the hydrochlorothiazide.  Plan: Medications:  Reduce hydrochlorothiazide dose to one half of a 25 mg tablet (equals 12.5 mg) daily; increase potassium chloride supplement to a dose of 20 mEq 3 times a day for 6 days.  Please note that the patient had left the clinic when her labs returned, so I called her and instructed her about these changes by phone.  Other plans: Patient will return for repeat basic metabolic panel next week.

## 2013-10-31 NOTE — Patient Instructions (Signed)
Decrease amitriptyline (Elavil) to a dose of 100 mg daily at bedtime. Call if you have symptoms of depression after changing the amitriptyline dose. Please contact Dr. Pearlean Brownie regarding topiramate dosing and possible side effects. Please call and schedule an appointment for routine screening mammogram - this is important, since your mammogram is overdue.

## 2013-10-31 NOTE — Assessment & Plan Note (Addendum)
BP Readings from Last 3 Encounters:  10/31/13 117/71  09/11/13 126/79  08/15/13 120/77    Lab Results  Component Value Date   NA 132* 10/31/2013   K 3.2* 10/31/2013   CREATININE 0.52 10/31/2013    Assessment: Blood pressure control: controlled Progress toward BP goal:  at goal Comments: Blood pressure is well controlled on hydrochlorothiazide 25 mg daily and nifedipine (Adalat CC) 90 mg daily.  However her potassium is again low at 3.2 on potassium chloride 40 mEq daily supplement; her sodium is also mildly low; both of these are likely due to the hydrochlorothiazide.  Plan: Medications:  Reduce hydrochlorothiazide dose to one half of a 25 mg tablet (equals 12.5 mg) daily; increase potassium chloride supplement to a dose of 20 mEq 3 times a day for 6 days.  Please note that the patient had left the clinic when her labs returned, so I called her and instructed her about these changes by phone.  Educational resources provided: brochure Self management tools provided: home blood pressure logbook Other plans: Patient will return for repeat basic metabolic panel next week.

## 2013-10-31 NOTE — Assessment & Plan Note (Signed)
Assessment:  Patient has mild chronic right leg edema.  A right lower extremity venous duplex study in March showed no evidence of DVT, superficial thrombosis, or Baker's cyst.  She reports that the edema responds well to a compression stocking and occasional elevation of her right leg.  Plan: Continue management with support stockings and elevation of her legs periodically during the day.

## 2013-10-31 NOTE — Assessment & Plan Note (Signed)
Assessment: Patient is doing well on her current inhaled bronchodilators (Advair Diskus 250-50 one puff twice a day, Pro-air inhaler 2 puffs 4 times a day as needed, and Spiriva one inhalation daily)  Plan: Continue current regimen; patient will followup with her pulmonologist Dr. Delford Field periodically as advised.

## 2013-11-08 ENCOUNTER — Other Ambulatory Visit (INDEPENDENT_AMBULATORY_CARE_PROVIDER_SITE_OTHER): Payer: Commercial Managed Care - HMO

## 2013-11-08 DIAGNOSIS — E876 Hypokalemia: Secondary | ICD-10-CM

## 2013-11-08 LAB — BASIC METABOLIC PANEL WITH GFR
BUN: 13 mg/dL (ref 6–23)
CO2: 28 mEq/L (ref 19–32)
Chloride: 102 mEq/L (ref 96–112)
GFR, Est African American: >89 mL/min
GFR, Est Non African American: >89 mL/min
Glucose, Bld: 108 mg/dL — ABNORMAL HIGH (ref 70–99)
Potassium: 4 mEq/L (ref 3.5–5.3)
Sodium: 138 mEq/L (ref 135–145)

## 2013-11-09 ENCOUNTER — Other Ambulatory Visit: Payer: Self-pay | Admitting: Internal Medicine

## 2013-11-09 DIAGNOSIS — E876 Hypokalemia: Secondary | ICD-10-CM

## 2013-11-09 MED ORDER — POTASSIUM CHLORIDE ER 10 MEQ PO TBCR: 20.0000 meq | EXTENDED_RELEASE_TABLET | Freq: Two times a day (BID) | ORAL | Status: DC

## 2013-11-15 ENCOUNTER — Ambulatory Visit (HOSPITAL_COMMUNITY)
Admission: RE | Admit: 2013-11-15 | Discharge: 2013-11-15 | Disposition: A | Discharge status: Home / Self Care | Payer: Medicare HMO | Source: Ambulatory Visit | Attending: Internal Medicine | Admitting: Internal Medicine

## 2013-11-15 DIAGNOSIS — Z1231 Encounter for screening mammogram for malignant neoplasm of breast: Secondary | ICD-10-CM | POA: Insufficient documentation

## 2013-11-15 DIAGNOSIS — Z1239 Encounter for other screening for malignant neoplasm of breast: Secondary | ICD-10-CM

## 2013-11-19 ENCOUNTER — Other Ambulatory Visit: Payer: Self-pay | Admitting: Internal Medicine

## 2013-11-26 ENCOUNTER — Other Ambulatory Visit: Payer: Self-pay | Admitting: *Deleted

## 2013-11-26 MED ORDER — CARISOPRODOL 350 MG PO TABS: 350.0000 mg | ORAL_TABLET | Freq: Three times a day (TID) | ORAL | Status: DC | PRN

## 2013-11-26 NOTE — Telephone Encounter (Signed)
Rx called in 

## 2013-11-26 NOTE — Telephone Encounter (Signed)
Refill approved - nurse to call in. 

## 2013-12-03 ENCOUNTER — Other Ambulatory Visit: Payer: Self-pay | Admitting: Internal Medicine

## 2013-12-03 NOTE — Telephone Encounter (Signed)
Xanax rx faxed to [facility].

## 2013-12-07 ENCOUNTER — Other Ambulatory Visit (INDEPENDENT_AMBULATORY_CARE_PROVIDER_SITE_OTHER): Payer: Medicare HMO

## 2013-12-07 DIAGNOSIS — Z1211 Encounter for screening for malignant neoplasm of colon: Secondary | ICD-10-CM

## 2013-12-07 LAB — POC HEMOCCULT BLD/STL (HOME/3-CARD/SCREEN)
FECAL OCCULT BLD: NEGATIVE
FECAL OCCULT BLD: NEGATIVE
Fecal Occult Blood, POC: NEGATIVE

## 2014-01-01 ENCOUNTER — Other Ambulatory Visit: Payer: Self-pay | Admitting: *Deleted

## 2014-01-01 NOTE — Telephone Encounter (Signed)
Last refill 12/29 Call pt when ready @ [phone]

## 2014-01-04 MED ORDER — HYDROCODONE-ACETAMINOPHEN 7.5-325 MG PO TABS: 1.0000 | ORAL_TABLET | Freq: Four times a day (QID) | ORAL | Status: DC | PRN

## 2014-01-04 NOTE — Telephone Encounter (Signed)
Refill printed and signed - nurse to complete. 

## 2014-01-04 NOTE — Telephone Encounter (Signed)
Pt informed Rx is ready 

## 2014-01-08 ENCOUNTER — Ambulatory Visit (HOSPITAL_COMMUNITY)
Admission: RE | Admit: 2014-01-08 | Discharge: 2014-01-08 | Disposition: A | Discharge status: Home / Self Care | Payer: Medicare HMO | Source: Ambulatory Visit | Attending: Internal Medicine | Admitting: Internal Medicine

## 2014-01-08 ENCOUNTER — Encounter: Payer: Self-pay | Admitting: Internal Medicine

## 2014-01-08 ENCOUNTER — Ambulatory Visit (INDEPENDENT_AMBULATORY_CARE_PROVIDER_SITE_OTHER): Payer: Commercial Managed Care - HMO | Admitting: Internal Medicine

## 2014-01-08 VITALS — BP 121/80 | HR 84 | Temp 97.7°F | Wt 249.4 lb

## 2014-01-08 DIAGNOSIS — J449 Chronic obstructive pulmonary disease, unspecified: Secondary | ICD-10-CM

## 2014-01-08 DIAGNOSIS — I10 Essential (primary) hypertension: Secondary | ICD-10-CM

## 2014-01-08 DIAGNOSIS — R058 Other specified cough: Secondary | ICD-10-CM

## 2014-01-08 DIAGNOSIS — E876 Hypokalemia: Secondary | ICD-10-CM

## 2014-01-08 DIAGNOSIS — R0602 Shortness of breath: Secondary | ICD-10-CM | POA: Insufficient documentation

## 2014-01-08 DIAGNOSIS — R059 Cough, unspecified: Secondary | ICD-10-CM

## 2014-01-08 DIAGNOSIS — R05 Cough: Secondary | ICD-10-CM | POA: Insufficient documentation

## 2014-01-08 LAB — BASIC METABOLIC PANEL WITH GFR
BUN: 6 mg/dL (ref 6–23)
CHLORIDE: 102 meq/L (ref 96–112)
CO2: 28 mEq/L (ref 19–32)
Calcium: 9.2 mg/dL (ref 8.4–10.5)
Creat: 0.48 mg/dL — ABNORMAL LOW (ref 0.50–1.10)
GFR, Est African American: >89 mL/min
Glucose, Bld: 112 mg/dL — ABNORMAL HIGH (ref 70–99)
POTASSIUM: 4 meq/L (ref 3.5–5.3)
SODIUM: 140 meq/L (ref 135–145)

## 2014-01-08 MED ORDER — AZITHROMYCIN 250 MG PO TABS: ORAL_TABLET | ORAL | Status: DC

## 2014-01-08 NOTE — Assessment & Plan Note (Signed)
BP Readings from Last 3 Encounters:  01/08/14 121/80  10/31/13 117/71  09/11/13 126/79    Lab Results  Component Value Date   NA 138 11/08/2013   K 4.0 11/08/2013   CREATININE 0.58 11/08/2013    Assessment: Blood pressure control: controlled Progress toward BP goal:  at goal Comments: Blood pressure is well controlled on hydrochlorothiazide 12.5 mg daily and nifedipine (Adalat CC) 90 mg daily.  Plan: Medications:  continue current medications Educational resources provided: brochure Self management tools provided: home blood pressure logbook Other plans: Check a basic metabolic panel today.

## 2014-01-08 NOTE — Assessment & Plan Note (Signed)
Potassium  Date Value Range Status  11/08/2013 4.0  3.5 - 5.3 mEq/L Final  10/31/2013 3.2* 3.5 - 5.3 mEq/L Final  09/04/2013 3.6  3.5 - 5.3 mEq/L Final     Assessment: Potassium had improved to 4.0 on potassium chloride 20 mEq twice a day at last check on 11/08/2013.  Plan: Check a basic metabolic panel today; continue potassium chloride 20 mEq twice a day pending that result.

## 2014-01-08 NOTE — Assessment & Plan Note (Signed)
Assessment: Patient presents with a one-week history of productive cough; chest x-ray today shows stable chronic interstitial prominence and peribronchial thickening, suspect chronic interstitial lung disease/ bronchitis.  Plan: Presentation is consistent with bronchitis; given her history of COPD, plan is to treat with azithromycin 500 mg on first day followed by 250 mg daily for 4 days.  I advised patient to return if she has any worsening of her symptoms or new concerning symptoms.  Otherwise she will return in 2 weeks for follow-up.

## 2014-01-08 NOTE — Assessment & Plan Note (Signed)
SpO2 Readings from Last 3 Encounters:  01/08/14 94%  10/31/13 96%  09/11/13 98%    Assessment: Patient is doing well on her current inhaled bronchodilators (Advair Diskus 250-50 one puff twice a day, Pro-air inhaler 2 puffs 4 times a day as needed, and tiotropium (Spiriva) one inhalation daily)  Plan: Treat bronchitis with azithromycin; continue current inhaler regimen.

## 2014-01-08 NOTE — Progress Notes (Signed)
   Subjective:    Patient ID: Jennifer [name], female    DOB: 1941-05-31, 73 y.o.   MRN: [id number]  HPI Patient presents for followup of her hypertension, COPD, hypokalemia, and other chronic medical problems.  Her main complaint today is a cough productive of grayish colored sputum which began about one week ago; it was preceded by cold symptoms and a sore throat which has now resolved.  Patient reports having other family members who had similar symptoms recently.     Review of Systems  Constitutional: Negative for fever, chills and diaphoresis.  Respiratory: Positive for cough. Negative for shortness of breath.   Cardiovascular: Positive for leg swelling (Mild). Negative for chest pain.  Gastrointestinal: Negative for nausea, vomiting and abdominal pain.  Genitourinary: Negative for dysuria and frequency.  Musculoskeletal: Negative for myalgias.       Objective:   Physical Exam  Constitutional: No distress.  HENT:  Right Ear: External ear normal.  Left Ear: External ear normal.  Mouth/Throat: Oropharynx is clear and moist. No oropharyngeal exudate.  Cardiovascular: Normal rate, regular rhythm and normal heart sounds.  Exam reveals no gallop and no friction rub.   No murmur heard. Trace bilateral ankle edema  Pulmonary/Chest: Effort normal. She has rales (Bibasilar).  Abdominal: Soft. Bowel sounds are normal. There is no tenderness.       Assessment & Plan:

## 2014-01-08 NOTE — Patient Instructions (Signed)
General Instructions: Take azithromycin 250 mg two tablets on the first day, then one tablet daily for four days.    Progress Toward Treatment Goals:  Treatment Goal 01/08/2014  Blood pressure at goal    Self Care Goals & Plans:  Self Care Goal 01/08/2014  Manage my medications take my medicines as prescribed; bring my medications to every visit; refill my medications on time  Monitor my health keep track of my weight  Eat healthy foods eat baked foods instead of fried foods; eat foods that are low in salt; drink diet soda or water instead of juice or soda  Be physically active find an activity I enjoy  Meeting treatment goals maintain the current self-care plan    No flowsheet data found.   Care Management & Community Referrals:  Referral 01/08/2014  Referrals made for care management support none needed  Referrals made to community resources none

## 2014-01-22 ENCOUNTER — Other Ambulatory Visit: Payer: Self-pay | Admitting: Internal Medicine

## 2014-01-22 ENCOUNTER — Ambulatory Visit: Payer: Medicare HMO | Admitting: Internal Medicine

## 2014-01-23 NOTE — Telephone Encounter (Signed)
Nystatin was removed from med list Dr Marinda Elk earlier this month. Did not fill. Pls ask why pt needs.

## 2014-01-30 ENCOUNTER — Ambulatory Visit: Payer: Medicare HMO | Admitting: Internal Medicine

## 2014-02-04 ENCOUNTER — Other Ambulatory Visit: Payer: Self-pay | Admitting: *Deleted

## 2014-02-04 DIAGNOSIS — E876 Hypokalemia: Secondary | ICD-10-CM

## 2014-02-05 MED ORDER — NYSTATIN 100000 UNIT/GM EX POWD: CUTANEOUS | Status: DC

## 2014-02-05 MED ORDER — CARISOPRODOL 350 MG PO TABS: 350.0000 mg | ORAL_TABLET | Freq: Three times a day (TID) | ORAL | Status: DC | PRN

## 2014-02-05 MED ORDER — HYDROCODONE-ACETAMINOPHEN 7.5-325 MG PO TABS: 1.0000 | ORAL_TABLET | Freq: Four times a day (QID) | ORAL | Status: DC | PRN

## 2014-02-05 MED ORDER — POTASSIUM CHLORIDE ER 10 MEQ PO TBCR: 20.0000 meq | EXTENDED_RELEASE_TABLET | Freq: Two times a day (BID) | ORAL | Status: DC

## 2014-02-05 NOTE — Telephone Encounter (Signed)
Refills for hydrocodone-acetaminophen and carisoprodol printed and signed; nurse to complete.

## 2014-02-05 NOTE — Telephone Encounter (Signed)
Pt informed

## 2014-02-20 ENCOUNTER — Other Ambulatory Visit: Payer: Self-pay | Admitting: *Deleted

## 2014-02-20 MED ORDER — ALPRAZOLAM 0.5 MG PO TABS: 0.5000 mg | ORAL_TABLET | Freq: Three times a day (TID) | ORAL | Status: DC | PRN

## 2014-02-20 NOTE — Telephone Encounter (Signed)
Called to pharmacy 

## 2014-02-20 NOTE — Telephone Encounter (Signed)
Refill approved - nurse to call in. 

## 2014-03-06 ENCOUNTER — Other Ambulatory Visit: Payer: Self-pay | Admitting: *Deleted

## 2014-03-06 NOTE — Telephone Encounter (Signed)
Last refill 3/3 Call when ready @ [phone] Wants before Friday

## 2014-03-07 ENCOUNTER — Other Ambulatory Visit: Payer: Self-pay | Admitting: Internal Medicine

## 2014-03-07 MED ORDER — HYDROCODONE-ACETAMINOPHEN 7.5-325 MG PO TABS: 1.0000 | ORAL_TABLET | Freq: Four times a day (QID) | ORAL | Status: DC | PRN

## 2014-03-07 NOTE — Telephone Encounter (Signed)
Pt informed Rx is ready 

## 2014-03-07 NOTE — Telephone Encounter (Signed)
Refill printed and signed - nurse to complete. 

## 2014-03-11 ENCOUNTER — Other Ambulatory Visit: Payer: Self-pay | Admitting: Internal Medicine

## 2014-03-14 ENCOUNTER — Other Ambulatory Visit: Payer: Self-pay | Admitting: *Deleted

## 2014-03-14 MED ORDER — CARISOPRODOL 350 MG PO TABS: 350.0000 mg | ORAL_TABLET | Freq: Three times a day (TID) | ORAL | Status: DC | PRN

## 2014-03-14 NOTE — Telephone Encounter (Signed)
Refill approved - nurse to call in. 

## 2014-03-14 NOTE — Telephone Encounter (Signed)
[facility] rx called to [facility].

## 2014-03-22 ENCOUNTER — Other Ambulatory Visit: Payer: Self-pay | Admitting: Oncology

## 2014-03-22 ENCOUNTER — Other Ambulatory Visit: Payer: Self-pay | Admitting: *Deleted

## 2014-03-22 MED ORDER — ALPRAZOLAM 0.5 MG PO TABS: 0.5000 mg | ORAL_TABLET | Freq: Three times a day (TID) | ORAL | Status: DC | PRN

## 2014-03-24 ENCOUNTER — Other Ambulatory Visit: Payer: Self-pay | Admitting: Internal Medicine

## 2014-03-25 NOTE — Telephone Encounter (Signed)
Called to pharm 

## 2014-03-25 NOTE — Telephone Encounter (Signed)
Refill approved - nurse to call in. 

## 2014-04-03 ENCOUNTER — Ambulatory Visit (INDEPENDENT_AMBULATORY_CARE_PROVIDER_SITE_OTHER): Payer: Commercial Managed Care - HMO | Admitting: Internal Medicine

## 2014-04-03 ENCOUNTER — Encounter: Payer: Self-pay | Admitting: Internal Medicine

## 2014-04-03 VITALS — BP 138/80 | HR 81 | Temp 98.3°F | Wt 244.8 lb

## 2014-04-03 DIAGNOSIS — I10 Essential (primary) hypertension: Secondary | ICD-10-CM

## 2014-04-03 DIAGNOSIS — M545 Low back pain, unspecified: Secondary | ICD-10-CM

## 2014-04-03 DIAGNOSIS — M549 Dorsalgia, unspecified: Secondary | ICD-10-CM

## 2014-04-03 DIAGNOSIS — L309 Dermatitis, unspecified: Secondary | ICD-10-CM

## 2014-04-03 DIAGNOSIS — L259 Unspecified contact dermatitis, unspecified cause: Secondary | ICD-10-CM

## 2014-04-03 DIAGNOSIS — W57XXXA Bitten or stung by nonvenomous insect and other nonvenomous arthropods, initial encounter: Secondary | ICD-10-CM

## 2014-04-03 DIAGNOSIS — F329 Major depressive disorder, single episode, unspecified: Secondary | ICD-10-CM

## 2014-04-03 DIAGNOSIS — F3289 Other specified depressive episodes: Secondary | ICD-10-CM

## 2014-04-03 DIAGNOSIS — G8929 Other chronic pain: Secondary | ICD-10-CM

## 2014-04-03 DIAGNOSIS — S1096XA Insect bite of unspecified part of neck, initial encounter: Secondary | ICD-10-CM

## 2014-04-03 DIAGNOSIS — L723 Sebaceous cyst: Secondary | ICD-10-CM

## 2014-04-03 MED ORDER — AMITRIPTYLINE HCL 75 MG PO TABS: 75.0000 mg | ORAL_TABLET | Freq: Every day | ORAL | Status: DC

## 2014-04-03 MED ORDER — HYDROCODONE-ACETAMINOPHEN 7.5-325 MG PO TABS: 1.0000 | ORAL_TABLET | Freq: Four times a day (QID) | ORAL | Status: DC | PRN

## 2014-04-03 NOTE — Assessment & Plan Note (Signed)
Assessment: Patient's daughter found a small tick attached to patient's neck when she was in the waiting room of the clinic today.  They have dogs at home and she feels that the tick likely came from one ogf her dogs.  Patient has no local signs of inflammation or infection, and no systemic signs of a complication of tick exposure.  Plan: I advised patient to come in immediately if she develops any symptoms of discoloration, inflammation, fever, chills, headache, skin rash, abdominal pain, or other problems.

## 2014-04-03 NOTE — Assessment & Plan Note (Signed)
Assessment: Patient has a small cyst under her chin which appears to be a sebaceous cyst.  It is currently not inflamed, but has previously been inflamed (see clinic visit from September of 2013).  She would like to have it removed.  Plan: Refer to dermatologist for removal.

## 2014-04-03 NOTE — Progress Notes (Signed)
   Subjective:    Patient ID: Jennifer [name], female    DOB: [birth year], 73 y.o.   MRN: [id number]  HPI Patient returns for management of her chronic back pain, hypertension, and depression, and also requested referral for removal of a cyst under her chin.  Incidentally, her daughter removed a tick from patient's neck while in clinic this morning; the tick was attached.  Overall patient has been doing reasonably well since her last visit.  Her chronic back pain is stable and adequately controlled on her current dose of hydrocodone/acetaminophen.  She reports that she is taking her medications as prescribed; she did not bring her medications, but confirmed her list from memory.  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  She denies any symptoms of depression, and feels that she is doing well on her current regimen.  Since her last visit, she has been taking her carisoprodol Physiological scientist) twice a day as needed instead of 3 times a day and has been doing well on the lower dose.  Today she requested that we consider further reducing her amitriptyline dose; we previously reduced this from 150 mg daily to a dose of 100 mg daily; she asked if we could consider reducing further to a dose of 75 mg daily, which I think is a good idea.  Review of Systems  Constitutional: Negative for fever, chills and diaphoresis.  Respiratory: Negative for cough, shortness of breath and wheezing.   Cardiovascular: Positive for leg swelling (Chronic intermittent leg swelling, improves with elevation ). Negative for chest pain.  Gastrointestinal: Negative for nausea, vomiting, abdominal pain, blood in stool and anal bleeding.  Skin: Negative for rash.  Neurological: Negative for headaches.  Psychiatric/Behavioral: Negative for dysphoric mood.       Objective:   Physical Exam  Constitutional: No distress.  Cardiovascular: Normal rate, regular rhythm and normal heart  sounds.  Exam reveals no gallop and no friction rub.   Trace right ankle edema; no left lower extremity edema  Pulmonary/Chest: Effort normal and breath sounds normal. No respiratory distress. She has no wheezes. She has no rales.  Abdominal: Soft. Bowel sounds are normal. She exhibits no distension. There is no tenderness. There is no rebound and no guarding.  Skin:           Assessment & Plan:

## 2014-04-03 NOTE — Assessment & Plan Note (Signed)
Assessment: Patient's depression appears to be well controlled on her current regimen of amitriptyline 100 mg daily and sertraline 100 mg twice a day.  She requested a trial of further reducing the amitriptyline to a dose of 75 mg daily, which I think is a good idea given her age and the potential for medication side effects  Plan: Reduce amitriptyline to a dose of 75 mg once daily; continue sertraline 100 mg twice a day.  I advised patient to call or return if she has any symptoms of recurrent depression.Marland Kitchen

## 2014-04-03 NOTE — Assessment & Plan Note (Signed)
Assessment: Patient has stable chronic low back pain which is reasonably well controlled on her current regimen of hydrocodone 7.5 mg-acetaminophen 325 mg four times a day as needed for pain; she is also on chronic carisoprodol 350 mg which she has recently been taking only 2 times a day as needed for muscle spasm.    Plan: Continue hydrocodone 7.5 mg-acetaminophen 325 mg four times a day as needed for pain; reduce carisoprodol dose to 1 tablet two times a day as needed for muscle spasms.

## 2014-04-03 NOTE — Patient Instructions (Addendum)
General Instructions: 1.  Reduce amitriptyline to a dose of 75 mg one tablet daily at bedtime; contact us if you have any symptoms of depression. 2.  Please monitor the site of recent tick bite and return immediately if you have any redness, pain, skin rash, fever, abdominal pain, headache, or other new problems. 3.  A referral has been made to dermatology for treatment of skin nodule (probable cyst) on chin.   Progress Toward Treatment Goals:  Treatment Goal 04/03/2014  Blood pressure at goal    Self Care Goals & Plans:  Self Care Goal 04/03/2014  Manage my medications take my medicines as prescribed; refill my medications on time  Monitor my health keep track of my weight  Eat healthy foods eat foods that are low in salt; eat baked foods instead of fried foods  Be physically active -  Meeting treatment goals maintain the current self-care plan    No flowsheet data found.   Care Management & Community Referrals:  Referral 04/03/2014  Referrals made for care management support none needed  Referrals made to community resources none

## 2014-04-03 NOTE — Assessment & Plan Note (Signed)
BP Readings from Last 3 Encounters:  04/03/14 138/80  01/08/14 121/80  10/31/13 117/71    Lab Results  Component Value Date   NA 140 01/08/2014   K 4.0 01/08/2014   CREATININE 0.48* 01/08/2014    Assessment: Blood pressure control: controlled Progress toward BP goal:  at goal Comments: Blood pressure is well controlled on hydrochlorothiazide 12.5 mg daily and nifedipine (Adalat CC) 90 mg daily  Plan: Medications:  continue current medications Educational resources provided: brochure Self management tools provided: home blood pressure logbook

## 2014-04-03 NOTE — Assessment & Plan Note (Signed)
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    Plan: Continue 0.1% triamcinolone cream topical twice a day if needed.

## 2014-05-08 ENCOUNTER — Other Ambulatory Visit: Payer: Self-pay | Admitting: *Deleted

## 2014-05-09 MED ORDER — HYDROCODONE-ACETAMINOPHEN 7.5-325 MG PO TABS: 1.0000 | ORAL_TABLET | Freq: Four times a day (QID) | ORAL | Status: DC | PRN

## 2014-05-09 NOTE — Telephone Encounter (Signed)
Prescriptions X3 for refill of hydrocodone-acetaminophen 7.5-325 mg, one tablet four times a day as needed for pain, printed and signed (see below) and provided to refill nurse: #120 to be provided to patient now #120 to be filled no earlier than 06/08/2014 (to be held on file in clinic) #120 to be filled no earlier than 07/08/2014 (to be held on file in clinic)

## 2014-05-09 NOTE — Telephone Encounter (Signed)
Rx ready - pt called; talked to her daughter, Rise Paganini.

## 2014-05-20 ENCOUNTER — Other Ambulatory Visit: Payer: Self-pay | Admitting: *Deleted

## 2014-05-20 NOTE — Telephone Encounter (Signed)
We called in refill for alprazolam on 03/25/14 with two additional refills; please find out why there is no available refill.

## 2014-05-20 NOTE — Telephone Encounter (Signed)
They can use that refill; would advise them to call a week ahead for the next needed refill approval.

## 2014-05-20 NOTE — Telephone Encounter (Signed)
I called the pharmacy - pt has 1 refill left. Called the pt - stated her daughter didn't realize she had 1 refill left. Med list is not showing it unless u click on it; so I did not see it.

## 2014-06-05 ENCOUNTER — Ambulatory Visit (INDEPENDENT_AMBULATORY_CARE_PROVIDER_SITE_OTHER): Payer: Commercial Managed Care - HMO | Admitting: Internal Medicine

## 2014-06-05 ENCOUNTER — Ambulatory Visit (HOSPITAL_COMMUNITY)
Admission: RE | Admit: 2014-06-05 | Discharge: 2014-06-05 | Disposition: A | Discharge status: Home / Self Care | Payer: Medicare HMO | Source: Ambulatory Visit | Attending: Internal Medicine | Admitting: Internal Medicine

## 2014-06-05 ENCOUNTER — Encounter: Payer: Self-pay | Admitting: Internal Medicine

## 2014-06-05 VITALS — BP 115/80 | HR 83 | Temp 97.1°F | Ht 63.0 in | Wt 238.5 lb

## 2014-06-05 DIAGNOSIS — M25551 Pain in right hip: Secondary | ICD-10-CM

## 2014-06-05 DIAGNOSIS — W19XXXA Unspecified fall, initial encounter: Secondary | ICD-10-CM

## 2014-06-05 DIAGNOSIS — I10 Essential (primary) hypertension: Secondary | ICD-10-CM

## 2014-06-05 DIAGNOSIS — M25552 Pain in left hip: Secondary | ICD-10-CM

## 2014-06-05 DIAGNOSIS — M25562 Pain in left knee: Principal | ICD-10-CM

## 2014-06-05 DIAGNOSIS — M25561 Pain in right knee: Secondary | ICD-10-CM

## 2014-06-05 DIAGNOSIS — M170 Bilateral primary osteoarthritis of knee: Secondary | ICD-10-CM | POA: Insufficient documentation

## 2014-06-05 DIAGNOSIS — Z9181 History of falling: Secondary | ICD-10-CM | POA: Insufficient documentation

## 2014-06-05 DIAGNOSIS — Y92009 Unspecified place in unspecified non-institutional (private) residence as the place of occurrence of the external cause: Secondary | ICD-10-CM

## 2014-06-05 DIAGNOSIS — M25569 Pain in unspecified knee: Secondary | ICD-10-CM | POA: Insufficient documentation

## 2014-06-05 DIAGNOSIS — M25559 Pain in unspecified hip: Secondary | ICD-10-CM | POA: Insufficient documentation

## 2014-06-05 DIAGNOSIS — M1710 Unilateral primary osteoarthritis, unspecified knee: Secondary | ICD-10-CM | POA: Insufficient documentation

## 2014-06-05 NOTE — Assessment & Plan Note (Signed)
Assessment: Patient slipped and fell in the shower at home this morning.  She apparently does not have a non-slip mat in her bathtub, and I strongly advised patient and her daughter that she needs a nonslip mat or strips in the bathtub or shower.  Plan: We provided a home safety check list to the patient and daughter today.  As above, I strongly advised that she needs a non-slip mat or strips in the bath tub or shower.  I discussed the option of physical therapy with her, but she does not feel she needs physical therapy referral at this time.  If she has recurrent falls, then the plan will be to refer to physical therapy.

## 2014-06-05 NOTE — Progress Notes (Signed)
   Subjective:    Patient ID: Jennifer [name], female    DOB: 04-26-1941, 73 y.o.   MRN: [id number]  HPI Patient presents for followup management of her hypertension, hyperlipidemia and other chronic medical problems.  Her main complaint today is that she slipped and fell in the shower this morning while trying to adjust a shower curtain; she landed on her knees and now reports bilateral knee pain and hip pain, left greater than right.  She is able to bear weight.   Review of Systems  Musculoskeletal: Positive for arthralgias (bilateral knee and hip pain) and back pain.       Objective:   Physical Exam  Cardiovascular: Normal rate, regular rhythm and normal heart sounds.  Exam reveals no gallop and no friction rub.   No murmur heard. Pulmonary/Chest: Effort normal and breath sounds normal. No respiratory distress. She has no wheezes. She has no rales.  Abdominal: Soft. Bowel sounds are normal. She exhibits no distension. There is no tenderness. There is no rebound and no guarding.  Musculoskeletal:       Right hip: She exhibits normal range of motion.       Left hip: She exhibits normal range of motion.       Right knee: She exhibits no swelling, no effusion, no ecchymosis and no deformity. No tenderness found.       Left knee: She exhibits normal range of motion, no swelling, no effusion, no ecchymosis and no deformity. Tenderness found. Lateral joint line tenderness noted.        Assessment & Plan:

## 2014-06-05 NOTE — Patient Instructions (Signed)
General Instructions: Continue current medications.    Progress Toward Treatment Goals:  Treatment Goal 06/05/2014  Blood pressure at goal  Prevent falls unable to assess    Self Care Goals & Plans:  Self Care Goal 06/05/2014  Manage my medications -  Monitor my health -  Eat healthy foods drink diet soda or water instead of juice or soda; eat more vegetables; eat baked foods instead of fried foods; eat smaller portions  Be physically active -  Meeting treatment goals -    No flowsheet data found.   Care Management & Community Referrals:  Referral 06/05/2014  Referrals made for care management support -  Referrals made to community resources none

## 2014-06-05 NOTE — Assessment & Plan Note (Addendum)
Dg Knee Complete 4 Views Left  06/05/2014   CLINICAL DATA:  Status post fall.  Left knee pain.  EXAM: LEFT KNEE - COMPLETE 4+ VIEW  COMPARISON:  Two views of the left knee 09/02/2009.  FINDINGS: No acute bony or joint abnormality is identified. The patient has advanced tricompartmental osteoarthritis appearing worst in the medial and patellofemoral compartments. No joint effusion is seen. Soft tissue structures are unremarkable.  IMPRESSION: No acute finding.  Advanced tricompartmental osteoarthritis.   Electronically Signed   By: Inge Rise M.D.   On: 06/05/2014 10:50   Dg Knee Complete 4 Views Right  06/05/2014   CLINICAL DATA:  [place] in shower today, BILATERAL hip and knee pain  EXAM: RIGHT KNEE - COMPLETE 4+ VIEW  COMPARISON:  03/22/2007  FINDINGS: Osseous demineralization.  Tricompartmental osteoarthritic changes with joint space narrowing and spur formation greater at medial compartment and patellofemoral joint than lateral compartment.  No acute fracture, dislocation or bone destruction.  Large calcified loose body posteriorly at intercondylar region.  No knee joint effusion.  IMPRESSION: Tricompartmental osteoarthritic changes with calcified body posteriorly at intercondylar region.  No definite acute osseous findings.   Electronically Signed   By: Lavonia Dana M.D.   On: 06/05/2014 10:59    Assessment: Patient complains of acute knee pain, left greater than right, following a fall that occurred today when she slipped in the shower.  She has some tenderness of the lateral aspect of the left knee along the joint line.  X-rays show tricompartmental osteoarthritis of the knees.  Plan: Patient is able to bear weight on the knee.  I offered referral to orthopedic surgery or sports medicine for evaluation and treatment, but patient declined.  She would like to try conservative therapy with current analgesic medication (hydrocodone 7.5 mg-acetaminophen 325 mg 4 times a day as needed for pain), and  consider referral if her pain worsens or does not improve.

## 2014-06-05 NOTE — Assessment & Plan Note (Addendum)
Dg Hip Bilateral W/pelvis  06/05/2014   CLINICAL DATA:  [place] in shower, BILATERAL hip and knee pain  EXAM: BILATERAL HIP TWO VIEWS  COMPARISON:  02/07/2013  FINDINGS: Osseous demineralization.  Minimal narrowing of the hip joints bilaterally.  SI joints symmetric.  Scattered pelvic phleboliths noted.  No acute fracture, dislocation or bone destruction.  IMPRESSION: No acute osseous abnormalities.   Electronically Signed   By: Lavonia Dana M.D.   On: 06/05/2014 10:52   Assessment: Patient reports bilateral hip left greater than right following a fall in the shower at home this morning.  Examination of her hips shows no pain with internal/external rotation.  Plan: Continue current regimen of hydrocodone 7.5 mg-acetaminophen 325 mg 4 times a day as needed for pain.

## 2014-06-05 NOTE — Assessment & Plan Note (Signed)
BP Readings from Last 3 Encounters:  06/05/14 115/80  04/03/14 138/80  01/08/14 121/80    Lab Results  Component Value Date   NA 140 01/08/2014   K 4.0 01/08/2014   CREATININE 0.48* 01/08/2014    Assessment: Blood pressure control: controlled Progress toward BP goal:  at goal Comments: blood pressure is well controlled on hydrochlorothiazide 12.5 mg daily and nifedipine (Adalat CC) 90 mg daily  Plan: Medications:  continue current medications

## 2014-06-06 ENCOUNTER — Other Ambulatory Visit: Payer: Self-pay | Admitting: Internal Medicine

## 2014-06-11 ENCOUNTER — Other Ambulatory Visit: Payer: Self-pay | Admitting: Internal Medicine

## 2014-06-14 ENCOUNTER — Other Ambulatory Visit: Payer: Self-pay | Admitting: *Deleted

## 2014-06-14 MED ORDER — FLUTICASONE-SALMETEROL 250-50 MCG/DOSE IN AEPB: 1.0000 | INHALATION_SPRAY | Freq: Two times a day (BID) | RESPIRATORY_TRACT | Status: DC

## 2014-06-14 MED ORDER — ALBUTEROL SULFATE HFA 108 (90 BASE) MCG/ACT IN AERS: INHALATION_SPRAY | RESPIRATORY_TRACT | Status: DC

## 2014-06-14 MED ORDER — TIOTROPIUM BROMIDE MONOHYDRATE 18 MCG IN CAPS
18.0000 ug | ORAL_CAPSULE | Freq: Every day | RESPIRATORY_TRACT | Status: DC
Start: 2014-06-14 — End: 2014-06-18

## 2014-06-18 MED ORDER — FLUTICASONE-SALMETEROL 250-50 MCG/DOSE IN AEPB: 1.0000 | INHALATION_SPRAY | Freq: Two times a day (BID) | RESPIRATORY_TRACT | Status: DC

## 2014-06-18 MED ORDER — TIOTROPIUM BROMIDE MONOHYDRATE 18 MCG IN CAPS: 18.0000 ug | ORAL_CAPSULE | Freq: Every day | RESPIRATORY_TRACT | Status: DC

## 2014-06-18 MED ORDER — ALBUTEROL SULFATE HFA 108 (90 BASE) MCG/ACT IN AERS: INHALATION_SPRAY | RESPIRATORY_TRACT | Status: DC

## 2014-06-18 NOTE — Telephone Encounter (Addendum)
[employer] asking if meds sent 7.10.15 were sent in error to the [address] meds were sent there in error and should have gone to the [place] is transferring me to the pharmacist Cloyde Reams  Verbal rx's given to [facility] and correct pharmacy added to pt's chart Will sign

## 2014-06-18 NOTE — Addendum Note (Signed)
Addended by: Parke Poisson E on: 06/18/2014 01:07 PM   Modules accepted: Orders

## 2014-06-24 ENCOUNTER — Other Ambulatory Visit: Payer: Self-pay | Admitting: *Deleted

## 2014-06-24 MED ORDER — ALPRAZOLAM 0.5 MG PO TABS: 0.5000 mg | ORAL_TABLET | Freq: Three times a day (TID) | ORAL | Status: DC | PRN

## 2014-06-24 NOTE — Telephone Encounter (Signed)
I denied the Manuela Neptune as too early. I filled Xanax per contract. Pls phone in Xanax

## 2014-06-24 NOTE — Telephone Encounter (Signed)
Pt's daughter calls for refills Called pharmacy for last refill dates: Xanax- #120- 05/26/14 Soma-#90-07/06/15

## 2014-06-24 NOTE — Telephone Encounter (Signed)
Called daughter and informed her of approval of xanax and denial of soma, she was agreeable. Xanax called to [facility]

## 2014-06-26 ENCOUNTER — Ambulatory Visit: Payer: Commercial Managed Care - HMO | Admitting: Critical Care Medicine

## 2014-07-02 ENCOUNTER — Other Ambulatory Visit: Payer: Self-pay | Admitting: Internal Medicine

## 2014-07-03 ENCOUNTER — Telehealth (HOSPITAL_COMMUNITY): Payer: Self-pay | Admitting: Interventional Radiology

## 2014-07-03 ENCOUNTER — Other Ambulatory Visit (HOSPITAL_COMMUNITY): Payer: Self-pay | Admitting: Interventional Radiology

## 2014-07-03 ENCOUNTER — Encounter: Payer: Self-pay | Admitting: Internal Medicine

## 2014-07-03 ENCOUNTER — Ambulatory Visit (INDEPENDENT_AMBULATORY_CARE_PROVIDER_SITE_OTHER): Payer: Commercial Managed Care - HMO | Admitting: Internal Medicine

## 2014-07-03 VITALS — BP 120/90 | HR 82 | Temp 97.2°F | Ht 63.0 in | Wt 236.9 lb

## 2014-07-03 DIAGNOSIS — G4733 Obstructive sleep apnea (adult) (pediatric): Secondary | ICD-10-CM

## 2014-07-03 DIAGNOSIS — IMO0001 Reserved for inherently not codable concepts without codable children: Secondary | ICD-10-CM

## 2014-07-03 DIAGNOSIS — I671 Cerebral aneurysm, nonruptured: Secondary | ICD-10-CM

## 2014-07-03 DIAGNOSIS — M797 Fibromyalgia: Secondary | ICD-10-CM

## 2014-07-03 DIAGNOSIS — J449 Chronic obstructive pulmonary disease, unspecified: Secondary | ICD-10-CM

## 2014-07-03 DIAGNOSIS — Z9181 History of falling: Secondary | ICD-10-CM

## 2014-07-03 DIAGNOSIS — I10 Essential (primary) hypertension: Secondary | ICD-10-CM

## 2014-07-03 LAB — BASIC METABOLIC PANEL WITH GFR
BUN: 9 mg/dL (ref 6–23)
CO2: 26 meq/L (ref 19–32)
Calcium: 9.4 mg/dL (ref 8.4–10.5)
Chloride: 99 mEq/L (ref 96–112)
Creat: 0.59 mg/dL (ref 0.50–1.10)
GFR, Est African American: >89 mL/min
Glucose, Bld: 98 mg/dL (ref 70–99)
POTASSIUM: 3.5 meq/L (ref 3.5–5.3)
Sodium: 135 mEq/L (ref 135–145)

## 2014-07-03 NOTE — Assessment & Plan Note (Signed)
Assessment: Patient was seen in clinic 06/05/2014 after she slipped and fell in the shower at home.  She reports no further falls since then.  Her knee pain is much better, and her hip pain is improved although she does have some chronic bilateral hip pain.  Plan: Continue home fall precautions.

## 2014-07-03 NOTE — Assessment & Plan Note (Signed)
Assessment: Patient has a history of fibromyalgia/chronic pain syndrome.  She is doing well on current regimen, .  Plan: Continue current regimen.

## 2014-07-03 NOTE — Assessment & Plan Note (Signed)
Assessment: Patient reports using her CPAP regularly.     Plan: Continue CPAP.

## 2014-07-03 NOTE — Assessment & Plan Note (Addendum)
BP Readings from Last 3 Encounters:  07/03/14 120/90  06/05/14 115/80  04/03/14 138/80    Lab Results  Component Value Date   NA 140 01/08/2014   K 4.0 01/08/2014   CREATININE 0.48* 01/08/2014    Assessment: Blood pressure control: controlled Progress toward BP goal:  at goal Comments: Well controlled on hydrochlorothiazide 12.5 mg daily and nifedipine(Adalat CC) 90 mg daily.  Plan: Medications:  continue current medications Other plans: Check a basic metabolic panel today

## 2014-07-03 NOTE — Progress Notes (Signed)
   Subjective:    Patient ID: Jennifer [name], female    DOB: 26-Jun-1941, 73 y.o.   MRN: [id number]  HPI Patient returns for followup of bilateral knee and hip pain following a fall sustained at home on 06/05/2014, and for management of her hypertension, COPD, fibromyalgia, and other chronic medical problems.  Her knee pain is much better, and she reports improvement in the hip pain although she has baseline chronic hip pain.  Today she reports some recent low back pain which is typical of her chronic back pain.  She did not bring her medications to clinic, but confirmed medications from the list; she reports that she is compliant with her medications.  Following her last visit, she tried reducing her carisoprodol (Soma) dose to once a day, but reports recurrence of muscle spasms that were subsequently relieved when she resumed the twice a day dose.  She has not had any further falls following her last visit here.  She reports that she is using her inhaled bronchodilators regularly, with no recent breathing problems.  She reports that she wears her CPAP regularly at night.   Review of Systems  Constitutional: Negative for fever, chills and diaphoresis.  Respiratory: Negative for shortness of breath.   Cardiovascular: Negative for chest pain.  Gastrointestinal: Negative for nausea, vomiting and abdominal pain.  Musculoskeletal: Positive for back pain.  Neurological: Negative for weakness.       Objective:   Physical Exam  Cardiovascular: Normal rate and regular rhythm.  Exam reveals friction rub. Exam reveals no gallop.   No murmur heard. Pulmonary/Chest: Effort normal and breath sounds normal. No respiratory distress. She has no wheezes. She has no rales.  Abdominal: Soft. Bowel sounds are normal. She exhibits no distension. There is no tenderness. There is no rebound and no guarding.  Musculoskeletal: She exhibits no edema.  Neurological:  Lower extremity strength normal bilaterally; straight  leg raise negative bilaterally.       Assessment & Plan:

## 2014-07-03 NOTE — Telephone Encounter (Signed)
Called pt to schedule f/u MRI/MRA. No answer and no VM. JM

## 2014-07-03 NOTE — Assessment & Plan Note (Signed)
SpO2 Readings from Last 3 Encounters:  07/03/14 97%  06/05/14 95%  04/03/14 94%    Assessment: Patient is doing well on Advair Diskus 250-50 one puff twice a day, Pro-air inhaler 2 puffs 4 times a day as needed, and tiotropium (Spiriva) one inhalation daily.  Plan: Continue current medications; followup with pulmonologist Dr. Joya Gaskins as scheduled.

## 2014-07-03 NOTE — Patient Instructions (Signed)
Continue current medications. 

## 2014-07-05 ENCOUNTER — Other Ambulatory Visit: Payer: Self-pay | Admitting: Internal Medicine

## 2014-07-11 ENCOUNTER — Other Ambulatory Visit: Payer: Self-pay | Admitting: Internal Medicine

## 2014-07-12 NOTE — Telephone Encounter (Signed)
Refill approved - nurse to call in. 

## 2014-07-12 NOTE — Telephone Encounter (Signed)
Called to pharmacy 

## 2014-08-07 ENCOUNTER — Ambulatory Visit: Payer: Commercial Managed Care - HMO | Admitting: Critical Care Medicine

## 2014-08-07 ENCOUNTER — Other Ambulatory Visit: Payer: Self-pay | Admitting: *Deleted

## 2014-08-07 DIAGNOSIS — M25561 Pain in right knee: Secondary | ICD-10-CM

## 2014-08-07 DIAGNOSIS — M25562 Pain in left knee: Principal | ICD-10-CM

## 2014-08-07 MED ORDER — HYDROCODONE-ACETAMINOPHEN 7.5-325 MG PO TABS: 1.0000 | ORAL_TABLET | Freq: Four times a day (QID) | ORAL | Status: DC | PRN

## 2014-08-07 NOTE — Telephone Encounter (Signed)
Pt informed Rx is ready 

## 2014-08-07 NOTE — Telephone Encounter (Signed)
Last refill per pharmacy 8/3 Pt # [phone]

## 2014-08-08 ENCOUNTER — Other Ambulatory Visit: Payer: Self-pay | Admitting: *Deleted

## 2014-08-08 MED ORDER — CARISOPRODOL 350 MG PO TABS: 350.0000 mg | ORAL_TABLET | Freq: Two times a day (BID) | ORAL | Status: DC | PRN

## 2014-08-08 NOTE — Telephone Encounter (Signed)
Rx called in to pharmacy. 

## 2014-08-08 NOTE — Telephone Encounter (Signed)
Would you pls phone in? Seems to be chronic med. Will Rx three months and send to PCP

## 2014-08-26 ENCOUNTER — Ambulatory Visit: Payer: Commercial Managed Care - HMO | Admitting: Critical Care Medicine

## 2014-09-02 ENCOUNTER — Other Ambulatory Visit: Payer: Self-pay | Admitting: *Deleted

## 2014-09-02 DIAGNOSIS — M25561 Pain in right knee: Secondary | ICD-10-CM

## 2014-09-02 DIAGNOSIS — M25562 Pain in left knee: Principal | ICD-10-CM

## 2014-09-03 MED ORDER — HYDROCODONE-ACETAMINOPHEN 7.5-325 MG PO TABS: 1.0000 | ORAL_TABLET | Freq: Four times a day (QID) | ORAL | Status: DC | PRN

## 2014-09-03 NOTE — Telephone Encounter (Signed)
Prescriptions X3 for refill of hydrocodone-acetaminophen 7.5-325 mg, one tablet four times a day as needed for pain, printed and signed (see below) and provided to refill nurse:  #120 to be  filled no earlier than 09/07/2014 #120 to be filled no earlier than 10/07/2014 (to be held on file in clinic)  #120 to be filled no earlier than 11/06/2014 (to be held on file in clinic)

## 2014-09-06 ENCOUNTER — Other Ambulatory Visit: Payer: Self-pay | Admitting: Internal Medicine

## 2014-09-06 MED ORDER — ALPRAZOLAM 0.5 MG PO TABS: 0.5000 mg | ORAL_TABLET | Freq: Three times a day (TID) | ORAL | Status: DC | PRN

## 2014-09-06 NOTE — Telephone Encounter (Signed)
Refill approved - nurse to call in the alprazolam refill.

## 2014-09-10 NOTE — Telephone Encounter (Signed)
Rx called in to pharmacy. 

## 2014-09-12 ENCOUNTER — Other Ambulatory Visit: Payer: Self-pay | Admitting: *Deleted

## 2014-09-12 NOTE — Telephone Encounter (Signed)
Please clarify - does this need to be called in to a local pharmacy or not?

## 2014-09-16 ENCOUNTER — Ambulatory Visit: Payer: Commercial Managed Care - HMO | Admitting: Critical Care Medicine

## 2014-09-17 NOTE — Telephone Encounter (Signed)
Pt states Rx called in to [facility] and has not picked up Rx. Hilda Blades Sonakshi Rolland RN 09/17/14 3:15PM

## 2014-09-17 NOTE — Telephone Encounter (Signed)
Talked to pt and states Rx has been called in to [facility] and have not picked up Rx.

## 2014-09-28 ENCOUNTER — Other Ambulatory Visit: Payer: Self-pay | Admitting: Internal Medicine

## 2014-09-28 DIAGNOSIS — F32A Depression, unspecified: Secondary | ICD-10-CM

## 2014-09-28 DIAGNOSIS — F329 Major depressive disorder, single episode, unspecified: Secondary | ICD-10-CM

## 2014-09-30 MED ORDER — AMITRIPTYLINE HCL 50 MG PO TABS: 50.0000 mg | ORAL_TABLET | Freq: Every day | ORAL | Status: DC

## 2014-09-30 NOTE — Telephone Encounter (Signed)
I received a refill request for amitriptyline 75 mg daily at bedtime.  I previously reduced her amitriptyline dose from 100 mg daily to 75 mg daily on 04/03/2014, with a goal of further tapering her amitriptyline as tolerated given concerns about the potential for side effects in older patients.  I called patient at home today and discussed this with her.  She reports that she has done well on the 75 mg daily dose, without any depressive symptoms or other problems.  She agrees with a plan to now reduce her amitriptyline dose to 50 mg daily at bedtime.  She agreed to let me know immediately if she has any symptoms of recurrent depression or other problems following the decrease in dose.  I also advised that she followup in my clinic, and I will request that an appointment be scheduled.

## 2014-09-30 NOTE — Assessment & Plan Note (Signed)
Telephone Contact Note  I received a refill request for amitriptyline 75 mg daily at bedtime.  I previously reduced her amitriptyline dose from 100 mg daily to 75 mg daily on 04/03/2014, with a goal of further tapering her amitriptyline as tolerated given concerns about the potential for side effects in older patients.  I called patient at home today and discussed this with her.  She reports that she has done well on the 75 mg daily dose, without any depressive symptoms or other problems.  She agrees with a plan to now reduce her amitriptyline dose to 50 mg daily at bedtime.  She agreed to let me know immediately if she has any symptoms of recurrent depression or other problems following the decrease in dose.  I also advised that she followup in my clinic, and I will request that an appointment be scheduled.

## 2014-10-14 ENCOUNTER — Ambulatory Visit: Payer: Commercial Managed Care - HMO | Admitting: *Deleted

## 2014-10-14 ENCOUNTER — Encounter: Payer: Self-pay | Admitting: Critical Care Medicine

## 2014-10-14 ENCOUNTER — Ambulatory Visit (INDEPENDENT_AMBULATORY_CARE_PROVIDER_SITE_OTHER): Payer: Commercial Managed Care - HMO | Admitting: Critical Care Medicine

## 2014-10-14 VITALS — BP 138/86 | HR 81 | Temp 98.7°F | Ht 63.0 in | Wt 239.0 lb

## 2014-10-14 DIAGNOSIS — Z23 Encounter for immunization: Secondary | ICD-10-CM

## 2014-10-14 DIAGNOSIS — J432 Centrilobular emphysema: Secondary | ICD-10-CM

## 2014-10-14 MED ORDER — ALBUTEROL SULFATE HFA 108 (90 BASE) MCG/ACT IN AERS: INHALATION_SPRAY | RESPIRATORY_TRACT | Status: DC

## 2014-10-14 MED ORDER — TIOTROPIUM BROMIDE MONOHYDRATE 18 MCG IN CAPS
18.0000 ug | ORAL_CAPSULE | Freq: Every day | RESPIRATORY_TRACT | Status: DC
Start: 2014-10-14 — End: 2016-04-23

## 2014-10-14 MED ORDER — FLUTICASONE-SALMETEROL 250-50 MCG/DOSE IN AEPB: 1.0000 | INHALATION_SPRAY | Freq: Two times a day (BID) | RESPIRATORY_TRACT | Status: DC

## 2014-10-14 NOTE — Assessment & Plan Note (Signed)
Copd with emphysema component Improved No change in inhaled or maintenance medications. Return in  1 year  Flu vaccine

## 2014-10-14 NOTE — Patient Instructions (Signed)
Refills sent to [facility] Flu vaccine was given Return 1 year

## 2014-10-14 NOTE — Progress Notes (Signed)
Subjective:    Patient ID: Jennifer [name], female    DOB: [birth year], 73 y.o.   MRN: [id number]  HPI  History of Present Illness:  73 y.o.    last smoked in 2009 with chronic obstructive lung disease, asthmatic bronchitic component.   10/14/2014 Chief Complaint  Patient presents with  . Yearly Follow up    Decreased activity level x 3 wks.  DOE has worsened during this time.  Nonprod cough.  No wheezing or chest tightness/pain.  Noting more dyspnea for three weeks.  Elavil dose reduced.  Not as interested in doing activity.  Dry cough.  Side effect issues.  Has appt in Jan with Dr Marinda Elk.  No wheezing or chest tightness    Review of Systems  Constitutional:   No  weight loss, night sweats,  Fevers, chills, fatigue, lassitude. HEENT:   No headaches,  Difficulty swallowing,  Tooth/dental problems,  Sore throat,                No sneezing, itching, ear ache, nasal congestion, post nasal drip,   CV:  No chest pain,  Orthopnea, PND, swelling in lower extremities, anasarca, dizziness, palpitations  GI  No heartburn, indigestion, abdominal pain, nausea, vomiting, diarrhea, change in bowel habits, loss of appetite  Resp: Notes shortness of breath with exertion not  at rest.  No excess mucus, no productive cough,  No non-productive cough,  No coughing up of blood.  No change in color of mucus.  No wheezing.  No chest wall deformity  Skin: no rash or lesions.  GU: no dysuria, change in color of urine, no urgency or frequency.  No flank pain.  MS:  No joint pain or swelling.  No decreased range of motion.  No back pain.  Psych:  No change in mood or affect. No depression or anxiety.  No memory loss.     Objective:   Physical Exam  Filed Vitals:   10/14/14 1155  BP: 138/86  Pulse: 81  Temp: 98.7 F (37.1 C)  TempSrc: Oral  Height: 5\' 3"  (1.6 m)  Weight: 239 lb (108.41 kg)  SpO2: 97%    Gen: Pleasant, well-nourished, in no distress,  normal affect  ENT: No lesions,  mouth  clear,  oropharynx clear, no postnasal drip  Neck: No JVD, no TMG, no carotid bruits  Lungs: No use of accessory muscles, no dullness to percussion, distant BS  Cardiovascular: RRR, heart sounds normal, no murmur or gallops, no peripheral edema  Abdomen: soft and NT, no HSM,  BS normal  Musculoskeletal: No deformities, no cyanosis or clubbing  Neuro: alert, non focal  Skin: Warm, no lesions or rashes        Assessment & Plan:   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 Return in  1 year  Flu vaccine     Updated Medication List Outpatient Encounter Prescriptions as of 10/14/2014  Medication Sig  . albuterol (PROAIR HFA) 108 (90 BASE) MCG/ACT inhaler INHALE 2 PUFFS BY MOUTH INTO LUNGS FOUR TIMES DAILY AS NEEDED  . ALPRAZolam (XANAX) 0.5 MG tablet Take 1-1.5 tablets (0.5-0.75 mg total) by mouth 3 (three) times daily as needed for anxiety.  Marland Kitchen amitriptyline (ELAVIL) 50 MG tablet Take 1 tablet (50 mg total) by mouth at bedtime.  Marland Kitchen aspirin 81 MG EC tablet Take 81 mg by mouth daily.    Marland Kitchen CALCIUM 600+D 600-400 MG-UNIT per tablet TAKE 1 TABLET BY MOUTH TWICE  DAILY  . carisoprodol (SOMA) 350 MG tablet Take 1 tablet (350 mg total) by mouth 2 (two) times daily as needed for muscle spasms.  Marland Kitchen CINNAMON PO Take by mouth 2 (two) times daily.  Marland Kitchen dextromethorphan-guaiFENesin (MUCINEX DM) 30-600 MG per 12 hr tablet Take 1 tablet by mouth 2 (two) times daily.  . Fluticasone-Salmeterol (ADVAIR DISKUS) 250-50 MCG/DOSE AEPB Inhale 1 puff into the lungs 2 (two) times daily.  . hydrochlorothiazide (HYDRODIURIL) 25 MG tablet Take 0.5 tablets (12.5 mg total) by mouth daily.  Marland Kitchen HYDROcodone-acetaminophen (NORCO) 7.5-325 MG per tablet Take 1 tablet by mouth 4 (four) times daily as needed for moderate pain or severe pain.  . hydroxypropyl methylcellulose (ISOPTO TEARS) 2.5 % ophthalmic solution Place 2 drops into both eyes 3 (three) times  daily as needed. Dry eye  . ibuprofen (ADVIL,MOTRIN) 200 MG tablet Take 400 mg by mouth 2 (two) times daily as needed. For pain    . meclizine (ANTIVERT) 12.5 MG tablet Take 12.5 mg by mouth daily.  . Multiple Vitamin (MULTIVITAMIN) tablet Take 1 tablet by mouth daily.  Marland Kitchen NIFEdipine (ADALAT CC) 90 MG 24 hr tablet Take 1 tablet (90 mg total) by mouth daily.  Marland Kitchen nystatin (MYCOSTATIN) powder APPLY TOPICALLY TWICE A DAY TO RASH UNDER ARM FOR 2 WEEKS.  Marland Kitchen omeprazole (PRILOSEC) 20 MG capsule Take 1 capsule (20 mg total) by mouth 2 (two) times daily.  . potassium chloride (K-DUR) 10 MEQ tablet Take 2 tablets (20 mEq total) by mouth 2 (two) times daily.  . ranitidine (ZANTAC) 150 MG tablet Take 1 tablet (150 mg total) by mouth at bedtime.  . Red Yeast Rice Extract (RED YEAST RICE PO) Take by mouth 2 (two) times daily.  . sertraline (ZOLOFT) 100 MG tablet Take 1 tablet (100 mg total) by mouth 2 (two) times daily.  Marland Kitchen tiotropium (SPIRIVA HANDIHALER) 18 MCG inhalation capsule Place 1 capsule (18 mcg total) into inhaler and inhale daily.  Marland Kitchen topiramate (TOPAMAX) 50 MG tablet Take 1 tablet (50 mg total) by mouth 2 (two) times daily.  Marland Kitchen triamcinolone cream (KENALOG) 0.1 % APPLY TOPICALLY TO THE AFFECTED AREA OF HAND TWICE DAILY AS NEEDED  . [DISCONTINUED] albuterol (PROAIR HFA) 108 (90 BASE) MCG/ACT inhaler INHALE 2 PUFFS BY MOUTH INTO LUNGS FOUR TIMES DAILY AS NEEDED  . [DISCONTINUED] Fluticasone-Salmeterol (ADVAIR DISKUS) 250-50 MCG/DOSE AEPB Inhale 1 puff into the lungs 2 (two) times daily.  . [DISCONTINUED] tiotropium (SPIRIVA HANDIHALER) 18 MCG inhalation capsule Place 1 capsule (18 mcg total) into inhaler and inhale daily.  . [DISCONTINUED] guaiFENesin (MUCINEX) 600 MG 12 hr tablet Take 600 mg by mouth 2 (two) times daily.

## 2014-11-06 ENCOUNTER — Other Ambulatory Visit: Payer: Self-pay | Admitting: Internal Medicine

## 2014-11-07 ENCOUNTER — Other Ambulatory Visit: Payer: Self-pay | Admitting: Internal Medicine

## 2014-11-07 NOTE — Telephone Encounter (Signed)
Called to pharmacy 

## 2014-11-07 NOTE — Telephone Encounter (Signed)
Refill approved - nurse to call in. 

## 2014-11-20 ENCOUNTER — Other Ambulatory Visit: Payer: Self-pay | Admitting: Internal Medicine

## 2014-12-04 ENCOUNTER — Other Ambulatory Visit: Payer: Self-pay | Admitting: *Deleted

## 2014-12-04 DIAGNOSIS — M25562 Pain in left knee: Principal | ICD-10-CM

## 2014-12-04 DIAGNOSIS — M25561 Pain in right knee: Secondary | ICD-10-CM

## 2014-12-04 MED ORDER — HYDROCODONE-ACETAMINOPHEN 7.5-325 MG PO TABS: 1.0000 | ORAL_TABLET | Freq: Four times a day (QID) | ORAL | Status: DC | PRN

## 2014-12-04 NOTE — Telephone Encounter (Signed)
Pt informed Rx is ready 

## 2014-12-04 NOTE — Telephone Encounter (Signed)
Refill printed and signed and provided to refill nurse. 

## 2014-12-04 NOTE — Telephone Encounter (Signed)
Last refill 12/2 Refill due on Saturday, can you post date and let her get today?

## 2014-12-12 ENCOUNTER — Ambulatory Visit (INDEPENDENT_AMBULATORY_CARE_PROVIDER_SITE_OTHER): Payer: Medicare HMO | Admitting: Internal Medicine

## 2014-12-12 ENCOUNTER — Encounter: Payer: Self-pay | Admitting: Internal Medicine

## 2014-12-12 VITALS — BP 115/61 | HR 81 | Temp 97.7°F | Ht 63.0 in | Wt 239.0 lb

## 2014-12-12 DIAGNOSIS — D225 Melanocytic nevi of trunk: Secondary | ICD-10-CM

## 2014-12-12 DIAGNOSIS — Z7951 Long term (current) use of inhaled steroids: Secondary | ICD-10-CM

## 2014-12-12 DIAGNOSIS — Z23 Encounter for immunization: Secondary | ICD-10-CM

## 2014-12-12 DIAGNOSIS — D229 Melanocytic nevi, unspecified: Secondary | ICD-10-CM

## 2014-12-12 DIAGNOSIS — J439 Emphysema, unspecified: Secondary | ICD-10-CM

## 2014-12-12 DIAGNOSIS — I10 Essential (primary) hypertension: Secondary | ICD-10-CM

## 2014-12-12 DIAGNOSIS — E785 Hyperlipidemia, unspecified: Secondary | ICD-10-CM

## 2014-12-12 DIAGNOSIS — M8589 Other specified disorders of bone density and structure, multiple sites: Secondary | ICD-10-CM

## 2014-12-12 DIAGNOSIS — M8580 Other specified disorders of bone density and structure, unspecified site: Secondary | ICD-10-CM

## 2014-12-12 DIAGNOSIS — G8929 Other chronic pain: Secondary | ICD-10-CM

## 2014-12-12 DIAGNOSIS — F329 Major depressive disorder, single episode, unspecified: Secondary | ICD-10-CM

## 2014-12-12 DIAGNOSIS — M170 Bilateral primary osteoarthritis of knee: Secondary | ICD-10-CM

## 2014-12-12 DIAGNOSIS — M545 Low back pain: Secondary | ICD-10-CM

## 2014-12-12 DIAGNOSIS — R739 Hyperglycemia, unspecified: Secondary | ICD-10-CM

## 2014-12-12 DIAGNOSIS — F32A Depression, unspecified: Secondary | ICD-10-CM

## 2014-12-12 LAB — COMPLETE METABOLIC PANEL WITH GFR
ALK PHOS: 102 U/L (ref 39–117)
ALT: 15 U/L (ref 0–35)
AST: 16 U/L (ref 0–37)
Albumin: 3.9 g/dL (ref 3.5–5.2)
BILIRUBIN TOTAL: 0.2 mg/dL (ref 0.2–1.2)
BUN: 17 mg/dL (ref 6–23)
CO2: 26 meq/L (ref 19–32)
Calcium: 9.1 mg/dL (ref 8.4–10.5)
Chloride: 105 mEq/L (ref 96–112)
Creat: 0.61 mg/dL (ref 0.50–1.10)
GFR, Est Non African American: 89 mL/min
GLUCOSE: 107 mg/dL — AB (ref 70–99)
POTASSIUM: 4.2 meq/L (ref 3.5–5.3)
SODIUM: 139 meq/L (ref 135–145)
TOTAL PROTEIN: 6.3 g/dL (ref 6.0–8.3)

## 2014-12-12 LAB — TSH: TSH: 2.597 u[IU]/mL (ref 0.350–4.500)

## 2014-12-12 LAB — LIPID PANEL
CHOLESTEROL: 228 mg/dL — AB (ref 0–200)
HDL: 61 mg/dL (ref >39)
LDL CALC: 131 mg/dL — AB (ref 0–99)
Total CHOL/HDL Ratio: 3.7 Ratio
Triglycerides: 182 mg/dL — ABNORMAL HIGH (ref <150)
VLDL: 36 mg/dL (ref 0–40)

## 2014-12-12 LAB — POCT GLYCOSYLATED HEMOGLOBIN (HGB A1C): HEMOGLOBIN A1C: 5.7

## 2014-12-12 LAB — GLUCOSE, CAPILLARY: Glucose-Capillary: 103 mg/dL — ABNORMAL HIGH (ref 70–99)

## 2014-12-12 MED ORDER — CARISOPRODOL 350 MG PO TABS: 350.0000 mg | ORAL_TABLET | Freq: Every day | ORAL | Status: DC | PRN

## 2014-12-12 NOTE — Assessment & Plan Note (Addendum)
Assessment: Patient has a history of osteopenia, with DXA scan done on 10/23/2009 which showed osteopenia of AP spine (young adult T-score -1.3), osteopenia of left femur neck (young adult T-score -1.8), and normal bone density of right femur neck (young adult T-score -0.8), with a FRAX estimate of the 10-year probability of a major osteoporotic fracture of 9.7% and probability of hip fracture 1.6%.  She is currently taking calcium with vitamin D.  Plan: Continue calcium with vitamin D; repeat DXA scan to assess.

## 2014-12-12 NOTE — Patient Instructions (Addendum)
Decrease carisoprodol (Soma) 350 mg to a dose of one tablet daily as needed for muscle spasms. Please have your annual screening mammogram done. A referral has been made to sports medicine for management of knee osteoarthritis. A referral has been made to dermatology for management of multiple nevi.

## 2014-12-12 NOTE — Assessment & Plan Note (Signed)
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  She is also concerned about a pigmented lesion on her face.  She requested referral to dermatology for evaluation.  Plan: Refer to dermatology.

## 2014-12-12 NOTE — Assessment & Plan Note (Signed)
Lab Results  Component Value Date   GLUCOSE 98 07/03/2014   GLUCOSE 112* 01/08/2014   GLUCOSE 108* 11/08/2013   GLUCOSE 117* 10/31/2013   GLUCOSE 117* 09/04/2013     Lab Results  Component Value Date   HGBA1C 5.7 12/12/2014   Lab Results  Component Value Date   GLUCAP 103* 12/12/2014      Assessment: Patient has had mild elevations of her glucose in the past; her hemoglobin A1c is normal at 5.7.  Plan: I discussed this with patient, and advised periodic monitoring of her glucose and A1c; I also advised that she avoid concentrated sweets.

## 2014-12-12 NOTE — Assessment & Plan Note (Signed)
Assessment: Patient has stable chronic low back pain and is doing well on hydrocodone 7.5 mg-acetaminophen 325 mg four times a day as needed for pain; she is also on chronic carisoprodol 350 mg, and we have successfully reduced her dose from 4 times a day to twice a day as needed for muscle spasm.  My goal has been to further taper the carisoprodol with the hope of eventually stopping it.  Patient reports that she recently tried not taking the carisoprodol for 3 days and had no problems.  She has been on that medication for a long time, and due to concerns about possible withdrawal symptoms if abruptly stopped, the plan is to taper further.   Plan: Continue hydrocodone 7.5 mg-acetaminophen 325 mg four times a day as needed for pain; reduce carisoprodol dose to 1 tablet daily as needed for muscle spasms.  I advised patient to call if she has any problems with this change in her medication.

## 2014-12-12 NOTE — Progress Notes (Signed)
   Subjective:    Patient ID: Jennifer [name], female    DOB: 1941/06/15, 75 y.o.   MRN: [id number]  HPI Patient presents for management of her COPD, bilateral knee osteoarthritis with chronic knee pain, chronic low back pain, hypertension, hyperlipidemia, and other chronic medical problems.  Her main complaint today is chronic bilateral knee pain, with some pain in her left thigh above the knee, which has been somewhat worse recently.  Her daughter, who accompanied her to clinic, was concerned that the patient has shown less interest in things over the past several months; she wondered if this was due to the reduction of patient's amitriptyline dose.  Patient denies feelings of depression, sadness/tearfulness, suicidal ideation, loss of appetite; she reports that their current living situation is stressful.  Patient reports that she has been compliant with her medications, and she brought her medication bottles to clinic for medication reconciliation today.  She is concerned about several moles on her back with color variation, and says she has a history of melanoma removed from her face several years ago.  Patient reports that she uses her CPAP regularly.   Review of Systems  Respiratory: Negative for shortness of breath and wheezing.   Cardiovascular: Positive for leg swelling (Stable mild chronic bilateral leg edema, right greater than left). Negative for chest pain.  Gastrointestinal: Negative for nausea, vomiting and abdominal pain.  Genitourinary: Negative for dysuria.  Musculoskeletal: Positive for back pain (Chronic) and arthralgias (Chronic knee and ankle pain).  Neurological: Negative for dizziness and syncope.  Psychiatric/Behavioral: Negative for suicidal ideas.    I reviewed and updated the medication list, allergies, past medical history, past surgical history, family history, and social history.     Objective:   Physical Exam  Constitutional: No distress.  Cardiovascular: Normal  rate, regular rhythm and normal heart sounds.  Exam reveals no gallop and no friction rub.   No murmur heard. 1+ right leg edema; trace left leg edema  Pulmonary/Chest: Effort normal and breath sounds normal. No respiratory distress. She has no wheezes. She has no rales.  Abdominal: Soft. Bowel sounds are normal. She exhibits no distension. There is no tenderness. There is no rebound and no guarding.        Assessment & Plan:

## 2014-12-12 NOTE — Assessment & Plan Note (Signed)
BP Readings from Last 3 Encounters:  12/12/14 115/61  10/14/14 138/86  07/03/14 120/90    Lab Results  Component Value Date   NA 135 07/03/2014   K 3.5 07/03/2014   CREATININE 0.59 07/03/2014    Assessment: Blood pressure control: controlled Progress toward BP goal:  at goal Comments: Blood pressure is controlled on hydrochlorothiazide 12.5 mg daily and nifedipine (Adalat CC) 90 mg daily.  Plan: Medications:  continue current medications

## 2014-12-12 NOTE — Assessment & Plan Note (Signed)
SpO2 Readings from Last 3 Encounters:  12/12/14 97%  10/14/14 97%  07/03/14 97%    Assessment: Patient is doing well on Advair Diskus 250-50 one puff twice a day, Pro-air inhaler 2 puffs 4 times a day as needed, and tiotropium (Spiriva) one inhalation daily.  She saw her pulmonologist Dr. Joya Gaskins in November, and he recommended one-year follow-up with him.  Plan: Continue current medications; followup with pulmonologist Dr. Joya Gaskins as scheduled.

## 2014-12-12 NOTE — Assessment & Plan Note (Signed)
Lab Results  Component Value Date   CHOL 215* 08/15/2013   HDL 65 08/15/2013   LDLCALC 110* 08/15/2013   TRIG 200* 08/15/2013   CHOLHDL 3.3 08/15/2013    Assessment: Patient was intolerant of rosuvastatin in the past due to muscle aches, and is currently on dietary management alone, which includes red yeast rice extract which patient has chosen to take on her own.  She reports that she follows a low-fat diet.  Plan: Continue dietary and lifestyle management; check a comprehensive metabolic panel and lipid panel today.

## 2014-12-12 NOTE — Assessment & Plan Note (Signed)
Assessment: Patient is currently on amitriptyline 50 mg daily and sertraline 100 mg twice a day.  Her daughter expressed some concern about patient's lack of interest in things following the taper off her amitriptyline dose.  Patient denies other symptoms of depression.   Plan: I discussed the advisability establishing care at [facility], and patient is agreeable to doing this.  Her daughter stated that she would assist with her getting an appointment.  For now, will continue current medications.

## 2014-12-12 NOTE — Assessment & Plan Note (Signed)
Assessment: Patient has chronic bilateral knee pain, with some recent worsening; X-rays in July of this year showed show tricompartmental osteoarthritis of the knees.  Plan: I discussed with patient the options of referral to sports medicine or to orthopedic surgery; patient prefers sports medicine referral at this time.  Her pain is reasonably well controlled on ibuprofen 400 mg twice a day as needed for pain and hydrocodone-acetaminophen 7.5-325 one tablet 4 times a day as needed for pain; plan is continue current pain medications.

## 2014-12-24 NOTE — Progress Notes (Signed)
Quick Note:  LDL is near goal on lifestyle management alone. ______

## 2015-01-03 ENCOUNTER — Other Ambulatory Visit: Payer: Self-pay | Admitting: Internal Medicine

## 2015-01-03 MED ORDER — CARISOPRODOL 350 MG PO TABS: 350.0000 mg | ORAL_TABLET | Freq: Every day | ORAL | Status: DC | PRN

## 2015-01-03 MED ORDER — ALPRAZOLAM 0.5 MG PO TABS: 0.5000 mg | ORAL_TABLET | Freq: Three times a day (TID) | ORAL | Status: DC | PRN

## 2015-01-03 NOTE — Telephone Encounter (Signed)
Refill approveds - nurse to complete.

## 2015-01-06 ENCOUNTER — Other Ambulatory Visit: Payer: Self-pay | Admitting: Internal Medicine

## 2015-01-06 DIAGNOSIS — M25561 Pain in right knee: Secondary | ICD-10-CM

## 2015-01-06 DIAGNOSIS — M25562 Pain in left knee: Principal | ICD-10-CM

## 2015-01-06 NOTE — Telephone Encounter (Signed)
Could we do 3 scripts for the pain med, the xanax has been called in but you will need to refuse it on this request

## 2015-01-07 MED ORDER — HYDROCODONE-ACETAMINOPHEN 7.5-325 MG PO TABS: 1.0000 | ORAL_TABLET | Freq: Four times a day (QID) | ORAL | Status: DC | PRN

## 2015-01-07 NOTE — Telephone Encounter (Signed)
Pt.notified

## 2015-01-07 NOTE — Telephone Encounter (Signed)
Rx called in 

## 2015-01-07 NOTE — Telephone Encounter (Signed)
I printed and signed 3 prescriptions for hydrocodone-acetaminophen 7.5-325 mg, take one tablet by mouth four times daily as needed for moderate pain or severe pain, #120 as follows:  Prescription to be filled today. Prescription to be filled no earlier than 02/06/2015. Prescription to be filled no earlier than 03/08/2015.  I provided the prescriptions to the refill nurse.

## 2015-01-08 ENCOUNTER — Ambulatory Visit (INDEPENDENT_AMBULATORY_CARE_PROVIDER_SITE_OTHER): Payer: Medicare HMO | Admitting: Sports Medicine

## 2015-01-08 ENCOUNTER — Encounter: Payer: Self-pay | Admitting: Sports Medicine

## 2015-01-08 VITALS — BP 139/100 | Ht 63.0 in | Wt 239.0 lb

## 2015-01-08 DIAGNOSIS — M170 Bilateral primary osteoarthritis of knee: Secondary | ICD-10-CM

## 2015-01-08 MED ORDER — METHYLPREDNISOLONE ACETATE 40 MG/ML IJ SUSP
40.0000 mg | Freq: Once | INTRAMUSCULAR | Status: AC
  Administered 2015-01-08: 40 mg via INTRA_ARTICULAR

## 2015-01-09 NOTE — Progress Notes (Signed)
   Subjective:    Patient ID: Jennifer [name], female    DOB: 1941/10/17, 74 y.o.   MRN: [id number]  HPI chief complaint: Bilateral knee pain  74 year old female comes in today complaining of long-standing bilateral knee pain. She has a history of osteoarthritis. No recent trauma but she did fall back in July 2015. X-rays done at that time showed no fracture. It was noted on those x-rays that she had advanced bilateral osteoarthritis of each knee. She has had both cortisone injections as well as Visco supplementation in the past. It was also recommended in the past that she have total knee arthroplasty but she is not interested in that. Occasional swelling. Pain is diffuse in nature. Present with rest as well as activity. She does get hydrocodone from her primary care physician and states that this does a good job of controlling her pain.  Past medical history reviewed Medications reviewed Allergies reviewed    Review of Systems     Objective:   Physical Exam Well-developed, well-nourished. No acute distress. Awake alert and oriented 3.  Examination of each knee shows range of motion from 0-120. 1+ boggy synovitis bilaterally. She is tender to palpation along the medial joint lines bilaterally. Negative McMurray's. Slight tenderness along the lateral joint lines as well. 1+ patellofemoral crepitus. Good joint stability. Neurovascularly intact distally. Walks with an antalgic gait.  X-rays are as above.       Assessment & Plan:  Bilateral knee pain secondary to advanced osteoarthritis  Definitive treatment is a total knee arthroplasty but the patient is not interested in that currently. I have offered her repeat cortisone injections and she agrees. They have helped her in the past. Each knee was injected using an anterior medial approach. She tolerated this without difficulty. She can continue on her hydrocodone per her PCPs discretion. Follow-up with me when necessary.  Consent  obtained and verified. Time-out conducted. Noted no overlying erythema, induration, or other signs of local infection. Skin prepped in a sterile fashion. Topical analgesic spray: Ethyl chloride. Joint: right knee Needle: 22g 1.5 inch Completed without difficulty. Meds: 3cc 1% xyloacine, 1cc (40mg ) depomedrol  Advised to call if fevers/chills, erythema, induration, drainage, or persistent bleeding.  Consent obtained and verified. Time-out conducted. Noted no overlying erythema, induration, or other signs of local infection. Skin prepped in a sterile fashion. Topical analgesic spray: Ethyl chloride. Joint: left knee Needle: 22g 1.5 inch Completed without difficulty. Meds: 3cc 1% xylocaine, 1cc (40mg ) depomedrol  Advised to call if fevers/chills, erythema, induration, drainage, or persistent bleeding.

## 2015-01-22 ENCOUNTER — Ambulatory Visit (INDEPENDENT_AMBULATORY_CARE_PROVIDER_SITE_OTHER): Payer: Medicare HMO | Admitting: Internal Medicine

## 2015-01-22 ENCOUNTER — Encounter: Payer: Self-pay | Admitting: Internal Medicine

## 2015-01-22 VITALS — BP 132/74 | HR 97 | Temp 98.2°F | Wt 239.1 lb

## 2015-01-22 DIAGNOSIS — J439 Emphysema, unspecified: Secondary | ICD-10-CM

## 2015-01-22 DIAGNOSIS — F32A Depression, unspecified: Secondary | ICD-10-CM

## 2015-01-22 DIAGNOSIS — I10 Essential (primary) hypertension: Secondary | ICD-10-CM | POA: Diagnosis not present

## 2015-01-22 DIAGNOSIS — Z7951 Long term (current) use of inhaled steroids: Secondary | ICD-10-CM | POA: Diagnosis not present

## 2015-01-22 DIAGNOSIS — E785 Hyperlipidemia, unspecified: Secondary | ICD-10-CM

## 2015-01-22 DIAGNOSIS — M8580 Other specified disorders of bone density and structure, unspecified site: Secondary | ICD-10-CM

## 2015-01-22 DIAGNOSIS — F411 Generalized anxiety disorder: Secondary | ICD-10-CM

## 2015-01-22 DIAGNOSIS — G4733 Obstructive sleep apnea (adult) (pediatric): Secondary | ICD-10-CM

## 2015-01-22 DIAGNOSIS — F329 Major depressive disorder, single episode, unspecified: Secondary | ICD-10-CM

## 2015-01-22 DIAGNOSIS — M170 Bilateral primary osteoarthritis of knee: Secondary | ICD-10-CM

## 2015-01-22 DIAGNOSIS — Z0000 Encounter for general adult medical examination without abnormal findings: Secondary | ICD-10-CM

## 2015-01-22 LAB — CBC WITH DIFFERENTIAL/PLATELET
BASOS PCT: 0 % (ref 0–1)
Basophils Absolute: 0 10*3/uL (ref 0.0–0.1)
Eosinophils Absolute: 0.3 10*3/uL (ref 0.0–0.7)
Eosinophils Relative: 3 % (ref 0–5)
HCT: 39.8 % (ref 36.0–46.0)
Hemoglobin: 13.2 g/dL (ref 12.0–15.0)
Lymphocytes Relative: 33 % (ref 12–46)
Lymphs Abs: 2.8 10*3/uL (ref 0.7–4.0)
MCH: 32.7 pg (ref 26.0–34.0)
MCHC: 33.2 g/dL (ref 30.0–36.0)
MCV: 98.5 fL (ref 78.0–100.0)
MONO ABS: 0.9 10*3/uL (ref 0.1–1.0)
MPV: 8.5 fL — ABNORMAL LOW (ref 8.6–12.4)
Monocytes Relative: 10 % (ref 3–12)
Neutro Abs: 4.6 10*3/uL (ref 1.7–7.7)
Neutrophils Relative %: 54 % (ref 43–77)
Platelets: 212 10*3/uL (ref 150–400)
RBC: 4.04 MIL/uL (ref 3.87–5.11)
RDW: 13.8 % (ref 11.5–15.5)
WBC: 8.6 10*3/uL (ref 4.0–10.5)

## 2015-01-22 NOTE — Assessment & Plan Note (Signed)
Assessment: Patient has a history of osteopenia, with DXA scan done on 10/23/2009 which showed osteopenia of AP spine (young adult T-score -1.3), osteopenia of left femur neck (young adult T-score -1.8), and normal bone density of right femur neck (young adult T-score -0.8), with a FRAX estimate of the 10-year probability of a major osteoporotic fracture of 9.7% and probability of hip fracture 1.6%.  She is currently taking calcium with vitamin D.  Plan: I ordered a DEXA scan in January, but the patient has not yet scheduled that study.  I discussed with her again today the importance of having the DEXA scan done to follow up her osteopenia.  She agreed to schedule the DEXA scan when she calls to schedule her mammogram..

## 2015-01-22 NOTE — Assessment & Plan Note (Signed)
I advised patient to schedule a screening mammogram and a DEXA scan to follow up osteopenia.

## 2015-01-22 NOTE — Progress Notes (Signed)
   Subjective:    Patient ID: Jennifer [name], female    DOB: Sep 25, 1941, 74 y.o.   MRN: [id number]  HPI Patient returns for management of her hypertension, hyperlipidemia, COPD, osteoarthritis, anxiety, depression, and other chronic medical problems.  She has no acute complaints today.  She saw Dr. Micheline Chapman in the Sports Medicine clinic on 01/08/2015 and had steroid injections of her knees; she reports some improvement in her knee pain, but not substantial improvement.  She reports that her chronic pain (knees, shoulders, and low back) is controlled on her current regimen of hydrocodone-acetaminophen, and that her functional status is definitely helped by the improved pain control.  Her anxiety and depression are well-controlled on alprazolam, sertraline, and amitriptyline.  Patient missed her dermatology appointment, and has not yet had a mammogram done.  She reports that she is wearing her CPAP regularly at night.  She has stable mild chronic exertional dyspnea, but no dyspnea at rest.  She has occasional mild ankle edema after she has been on her feet during the day.     Review of Systems  Constitutional: Negative for fever, chills and diaphoresis.  Respiratory: Positive for shortness of breath (Stable mild exertional dyspnea).   Cardiovascular: Positive for leg swelling (Occasional ankle edema). Negative for chest pain.  Gastrointestinal: Negative for nausea, vomiting, abdominal pain and blood in stool.  Genitourinary: Negative for dysuria.  Musculoskeletal: Positive for arthralgias (Chronic knee pain and shoulder pain).  Neurological: Negative for seizures, syncope, weakness and numbness.  Psychiatric/Behavioral: Negative for suicidal ideas.    I reviewed and updated the medication list, allergies, past medical history, past surgical history, family history, and social history.      Objective:   Physical Exam  Constitutional: She is oriented to person, place, and time. No distress.    Cardiovascular: Normal rate, regular rhythm and normal heart sounds.  Exam reveals no gallop and no friction rub.   No murmur heard. Trace ankle edema.  Pulmonary/Chest: Effort normal and breath sounds normal. No respiratory distress. She has no wheezes. She has no rales.  Abdominal: Soft. Bowel sounds are normal. She exhibits no distension. There is no tenderness. There is no rebound and no guarding.  Neurological: She is alert and oriented to person, place, and time.        Assessment & Plan:

## 2015-01-22 NOTE — Assessment & Plan Note (Signed)
Lab Results  Component Value Date   CHOL 228* 12/12/2014   HDL 61 12/12/2014   LDLCALC 131* 12/12/2014   TRIG 182* 12/12/2014   CHOLHDL 3.7 12/12/2014    Assessment: Patient was intolerant of rosuvastatin in the past due to muscle aches, and is currently on dietary management alone, which includes red yeast rice extract which patient has chosen to take on her own.  She reports that she follows a low-fat diet.  Her LDL is slightly above the target of 130 under the in ATP III guidelines.  The AHA 2013 guidelines do not provide a clear treatment recommendation for patients over 76 years of age with LDL-C less than 190 mg/dL, and those guidelines acknowledge that several factors including patient preferences contribute to the decision.  I discussed with patient today the potential benefits of trying an alternative statin medication such as pravastatin, with close attention to potential side effects.  However, she would prefer at this time to continue dietary management.  Plan: Continue dietary and lifestyle management, with periodic monitoring of her lipid panel.

## 2015-01-22 NOTE — Patient Instructions (Addendum)
Please call and reschedule your dermatology appointment. A referral has been made for screening mammogram. A referral has been made for a DEXA bone density scan. Please follow a low-fat diet.

## 2015-01-22 NOTE — Assessment & Plan Note (Addendum)
Assessment: Patient reports good control of her symptoms on her chronic regimen of alprazolam 0.5 mg take 1-1.5 tablets (0.5-0.75 mg total) 3 times a day as needed for anxiety and sertraline 100 mg twice a day.  I discussed again with patient the potential benefits of establishing as a patient with [facility] so her depression and anxiety can be managed by a psychiatrist, but she declined at this time.  Plan: Continue current medications.

## 2015-01-22 NOTE — Assessment & Plan Note (Signed)
Assessment: Patient reports good control of her depression on her chronic regimen of amitriptyline 50 mg daily and sertraline 100 mg twice a day.  I discussed again with patient the potential benefits of establishing as a patient with [facility] so her depression and anxiety can be managed by a psychiatrist, but she declined at this time.  I also discussed the advisability of further titrating her amitriptyline dose downward, but for now she would prefer to continue her current regimen.  She has no apparent side effects from her current dose of amitriptyline.  Plan: Continue current medications.

## 2015-01-22 NOTE — Assessment & Plan Note (Signed)
Assessment: Patient reports some improvement in her chronic bilateral knee pain following steroid injection by Dr. Micheline Chapman in the Sports Medicine clinic.  Her chronic knee pain, as well as her back pain and shoulder pain, is adequately controlled on her current regimen of ibuprofen 400 mg twice a day as needed for pain and hydrocodone-acetaminophen 7.5-325 mg one tablet 4 times a day as needed for pain.  She reports that her functional status is definitely improved by the use of the hydrocodone-acetaminophen.  I discussed the option of referral to an orthopedic surgeon to discuss the option of knee replacement, but given her medical comorbidities she would prefer to continue nonsurgical management.  Plan: Plan is continue current medications.  Patient will follow-up with Dr. Micheline Chapman as planned.

## 2015-01-22 NOTE — Assessment & Plan Note (Addendum)
Assessment: Patient reports that she is using her CPAP regularly.     Plan: Continue CPAP.

## 2015-01-22 NOTE — Assessment & Plan Note (Signed)
BP Readings from Last 3 Encounters:  01/22/15 132/74  01/08/15 139/100  12/12/14 115/61    Lab Results  Component Value Date   NA 139 12/12/2014   K 4.2 12/12/2014   CREATININE 0.61 12/12/2014    Assessment: Blood pressure control: controlled Progress toward BP goal:  at goal Comments: Blood pressure is controlled on hydrochlorothiazide 12.5 mg daily and nifedipine (Adalat CC) 90 mg daily.   Plan: Medications:  continue current medications Educational resources provided: brochure Self management tools provided: home blood pressure logbook

## 2015-01-22 NOTE — Assessment & Plan Note (Signed)
SpO2 Readings from Last 3 Encounters:  01/22/15 97%  12/12/14 97%  10/14/14 97%    Assessment: Patient is doing well on her current regimen which includes Advair Diskus 250-50 one puff twice a day, Pro-air inhaler 2 puffs 4 times a day as needed, and tiotropium (Spiriva) one inhalation daily.  She saw her pulmonologist Dr. Joya Gaskins in November, and he recommended one-year follow-up.  Plan: Continue current medications; followup with pulmonologist Dr. Joya Gaskins as scheduled.

## 2015-01-23 ENCOUNTER — Telehealth: Payer: Self-pay | Admitting: Internal Medicine

## 2015-01-23 DIAGNOSIS — E559 Vitamin D deficiency, unspecified: Secondary | ICD-10-CM

## 2015-01-23 LAB — VITAMIN D 25 HYDROXY (VIT D DEFICIENCY, FRACTURES): VIT D 25 HYDROXY: 24 ng/mL — AB (ref 30–100)

## 2015-01-23 MED ORDER — VITAMIN D3 25 MCG (1000 UT) PO CAPS: 1.0000 | ORAL_CAPSULE | Freq: Every day | ORAL | Status: DC

## 2015-01-23 NOTE — Telephone Encounter (Signed)
Patient's vitamin D level returned low at 24.  She has been taking calcium carbonate 600 mg-vitamin D 800 units 1 tablet twice a day.  The plan is to add vitamin D3 1,000 units daily to her regimen.  I called her at home and discussed this with her.  I advised her that her vitamin D level should be rechecked in 2 months.  I also called [facility] because she had received a letter saying that the number of alprazolam tablets prescribed per month exceeded the plan limit of 120 tablets.  Apparently, her pharmacy submitted the refill request for 120 tablets as a 27 day supply rather than a 30 day supply.  According to the [facility] staff member with whom I spoke, there will be no problem if the pharmacy simply submits a refill for 120 tablets as a 30 day supply.  I called her pharmacy and requested that they do this with any future refills.  I called the patient and explained the situation to her.

## 2015-01-23 NOTE — Assessment & Plan Note (Signed)
Telephone Contact Note   Lab Results  Component Value Date   VD25OH 24* 01/22/2015     Patient's vitamin D level returned low at 24.  She has been taking calcium carbonate 600 mg-vitamin D 800 units 1 tablet twice a day.  The plan is to add vitamin D3 1,000 units daily to her regimen.  I called patient at home and discussed this with her.  I advised her that her vitamin D level should be rechecked in 2 months.

## 2015-01-31 NOTE — Addendum Note (Signed)
Addended by: Hulan Fray on: 01/31/2015 07:24 PM   Modules accepted: Orders

## 2015-02-04 ENCOUNTER — Other Ambulatory Visit: Payer: Self-pay | Admitting: *Deleted

## 2015-02-05 MED ORDER — OMEPRAZOLE 20 MG PO CPDR: 20.0000 mg | DELAYED_RELEASE_CAPSULE | Freq: Two times a day (BID) | ORAL | Status: DC

## 2015-02-21 ENCOUNTER — Encounter: Payer: Self-pay | Admitting: *Deleted

## 2015-03-05 ENCOUNTER — Other Ambulatory Visit: Payer: Self-pay | Admitting: *Deleted

## 2015-03-05 MED ORDER — RANITIDINE HCL 150 MG PO TABS: 150.0000 mg | ORAL_TABLET | Freq: Every day | ORAL | Status: DC

## 2015-03-05 NOTE — Telephone Encounter (Signed)
Call from pt's dtr, stating pt no longer uses [facility] and has requested zantac refill be sent to [facility] ([address]).Despina Hidden Cassady3/30/20162:58 PM

## 2015-04-01 ENCOUNTER — Other Ambulatory Visit: Payer: Self-pay | Admitting: *Deleted

## 2015-04-01 NOTE — Telephone Encounter (Signed)
All from pt's dtr, mother is completely out of this medication-states she forgot to call for a refill.  Will forward request to pcp for review.Despina Hidden Cassady4/26/20163:54 PM

## 2015-04-02 MED ORDER — TOPIRAMATE 50 MG PO TABS: 50.0000 mg | ORAL_TABLET | Freq: Two times a day (BID) | ORAL | Status: DC

## 2015-04-07 ENCOUNTER — Other Ambulatory Visit: Payer: Self-pay | Admitting: *Deleted

## 2015-04-07 DIAGNOSIS — M25562 Pain in left knee: Principal | ICD-10-CM

## 2015-04-07 DIAGNOSIS — M25561 Pain in right knee: Secondary | ICD-10-CM

## 2015-04-07 MED ORDER — HYDROCODONE-ACETAMINOPHEN 7.5-325 MG PO TABS: 1.0000 | ORAL_TABLET | Freq: Four times a day (QID) | ORAL | Status: DC | PRN

## 2015-04-07 NOTE — Telephone Encounter (Signed)
Pt informed Rx is ready 

## 2015-04-07 NOTE — Telephone Encounter (Signed)
Last refill on 4/2 per pharmacy Pt # [phone] or [phone]

## 2015-04-25 ENCOUNTER — Emergency Department (HOSPITAL_COMMUNITY): Payer: Medicare HMO

## 2015-04-25 ENCOUNTER — Encounter (HOSPITAL_COMMUNITY): Payer: Self-pay | Admitting: Neurology

## 2015-04-25 ENCOUNTER — Telehealth: Payer: Self-pay | Admitting: *Deleted

## 2015-04-25 ENCOUNTER — Other Ambulatory Visit (HOSPITAL_COMMUNITY): Payer: Self-pay

## 2015-04-25 ENCOUNTER — Inpatient Hospital Stay (HOSPITAL_COMMUNITY)
Admission: EM | Admit: 2015-04-25 | Discharge: 2015-05-02 | DRG: 854 | Disposition: A | Discharge status: Skilled Nursing Facility | Payer: Medicare HMO | Attending: Internal Medicine | Admitting: Internal Medicine

## 2015-04-25 DIAGNOSIS — M1990 Unspecified osteoarthritis, unspecified site: Secondary | ICD-10-CM | POA: Diagnosis present

## 2015-04-25 DIAGNOSIS — I5032 Chronic diastolic (congestive) heart failure: Secondary | ICD-10-CM | POA: Diagnosis present

## 2015-04-25 DIAGNOSIS — Z87891 Personal history of nicotine dependence: Secondary | ICD-10-CM

## 2015-04-25 DIAGNOSIS — K8000 Calculus of gallbladder with acute cholecystitis without obstruction: Secondary | ICD-10-CM | POA: Diagnosis present

## 2015-04-25 DIAGNOSIS — R109 Unspecified abdominal pain: Secondary | ICD-10-CM | POA: Diagnosis not present

## 2015-04-25 DIAGNOSIS — E872 Acidosis, unspecified: Secondary | ICD-10-CM | POA: Diagnosis present

## 2015-04-25 DIAGNOSIS — M797 Fibromyalgia: Secondary | ICD-10-CM | POA: Diagnosis present

## 2015-04-25 DIAGNOSIS — I10 Essential (primary) hypertension: Secondary | ICD-10-CM | POA: Diagnosis present

## 2015-04-25 DIAGNOSIS — F419 Anxiety disorder, unspecified: Secondary | ICD-10-CM | POA: Diagnosis present

## 2015-04-25 DIAGNOSIS — H8109 Meniere's disease, unspecified ear: Secondary | ICD-10-CM | POA: Diagnosis present

## 2015-04-25 DIAGNOSIS — J449 Chronic obstructive pulmonary disease, unspecified: Secondary | ICD-10-CM | POA: Diagnosis present

## 2015-04-25 DIAGNOSIS — J439 Emphysema, unspecified: Secondary | ICD-10-CM | POA: Diagnosis present

## 2015-04-25 DIAGNOSIS — M545 Low back pain: Secondary | ICD-10-CM | POA: Diagnosis present

## 2015-04-25 DIAGNOSIS — G8929 Other chronic pain: Secondary | ICD-10-CM | POA: Diagnosis present

## 2015-04-25 DIAGNOSIS — E860 Dehydration: Secondary | ICD-10-CM | POA: Diagnosis present

## 2015-04-25 DIAGNOSIS — I472 Ventricular tachycardia: Secondary | ICD-10-CM | POA: Diagnosis present

## 2015-04-25 DIAGNOSIS — E876 Hypokalemia: Secondary | ICD-10-CM | POA: Diagnosis not present

## 2015-04-25 DIAGNOSIS — K810 Acute cholecystitis: Secondary | ICD-10-CM | POA: Diagnosis not present

## 2015-04-25 DIAGNOSIS — I480 Paroxysmal atrial fibrillation: Secondary | ICD-10-CM | POA: Diagnosis present

## 2015-04-25 DIAGNOSIS — D649 Anemia, unspecified: Secondary | ICD-10-CM | POA: Diagnosis present

## 2015-04-25 DIAGNOSIS — F329 Major depressive disorder, single episode, unspecified: Secondary | ICD-10-CM | POA: Diagnosis present

## 2015-04-25 DIAGNOSIS — I4892 Unspecified atrial flutter: Secondary | ICD-10-CM | POA: Diagnosis present

## 2015-04-25 DIAGNOSIS — E785 Hyperlipidemia, unspecified: Secondary | ICD-10-CM | POA: Diagnosis present

## 2015-04-25 DIAGNOSIS — A419 Sepsis, unspecified organism: Principal | ICD-10-CM | POA: Diagnosis present

## 2015-04-25 DIAGNOSIS — F32A Depression, unspecified: Secondary | ICD-10-CM | POA: Diagnosis present

## 2015-04-25 DIAGNOSIS — Z9049 Acquired absence of other specified parts of digestive tract: Secondary | ICD-10-CM | POA: Diagnosis not present

## 2015-04-25 DIAGNOSIS — R000 Tachycardia, unspecified: Secondary | ICD-10-CM | POA: Insufficient documentation

## 2015-04-25 DIAGNOSIS — Z6841 Body Mass Index (BMI) 40.0 and over, adult: Secondary | ICD-10-CM

## 2015-04-25 DIAGNOSIS — R0602 Shortness of breath: Secondary | ICD-10-CM | POA: Diagnosis not present

## 2015-04-25 DIAGNOSIS — I4729 Other ventricular tachycardia: Secondary | ICD-10-CM | POA: Insufficient documentation

## 2015-04-25 DIAGNOSIS — Z7982 Long term (current) use of aspirin: Secondary | ICD-10-CM | POA: Diagnosis not present

## 2015-04-25 DIAGNOSIS — G4733 Obstructive sleep apnea (adult) (pediatric): Secondary | ICD-10-CM | POA: Diagnosis present

## 2015-04-25 DIAGNOSIS — I5030 Unspecified diastolic (congestive) heart failure: Secondary | ICD-10-CM | POA: Diagnosis present

## 2015-04-25 DIAGNOSIS — K219 Gastro-esophageal reflux disease without esophagitis: Secondary | ICD-10-CM | POA: Diagnosis present

## 2015-04-25 DIAGNOSIS — Z9981 Dependence on supplemental oxygen: Secondary | ICD-10-CM

## 2015-04-25 LAB — COMPREHENSIVE METABOLIC PANEL
ALT: 15 U/L (ref 14–54)
AST: 25 U/L (ref 15–41)
Albumin: 3.6 g/dL (ref 3.5–5.0)
Alkaline Phosphatase: 73 U/L (ref 38–126)
Anion gap: 13 (ref 5–15)
BUN: 12 mg/dL (ref 6–20)
CO2: 18 mmol/L — AB (ref 22–32)
Calcium: 9.9 mg/dL (ref 8.9–10.3)
Chloride: 105 mmol/L (ref 101–111)
Creatinine, Ser: 0.81 mg/dL (ref 0.44–1.00)
Glucose, Bld: 165 mg/dL — ABNORMAL HIGH (ref 65–99)
POTASSIUM: 3 mmol/L — AB (ref 3.5–5.1)
SODIUM: 136 mmol/L (ref 135–145)
Total Bilirubin: 0.4 mg/dL (ref 0.3–1.2)
Total Protein: 7.8 g/dL (ref 6.5–8.1)

## 2015-04-25 LAB — URINALYSIS, ROUTINE W REFLEX MICROSCOPIC
GLUCOSE, UA: NEGATIVE mg/dL
HGB URINE DIPSTICK: NEGATIVE
Ketones, ur: NEGATIVE mg/dL
Nitrite: NEGATIVE
PROTEIN: 30 mg/dL — AB
Specific Gravity, Urine: 1.023 (ref 1.005–1.030)
UROBILINOGEN UA: 1 mg/dL (ref 0.0–1.0)
pH: 7.5 (ref 5.0–8.0)

## 2015-04-25 LAB — CBC WITH DIFFERENTIAL/PLATELET
BASOS ABS: 0 10*3/uL (ref 0.0–0.1)
BASOS PCT: 0 % (ref 0–1)
Eosinophils Absolute: 0 10*3/uL (ref 0.0–0.7)
Eosinophils Relative: 0 % (ref 0–5)
HEMATOCRIT: 44.1 % (ref 36.0–46.0)
HEMOGLOBIN: 15.1 g/dL — AB (ref 12.0–15.0)
Lymphocytes Relative: 3 % — ABNORMAL LOW (ref 12–46)
Lymphs Abs: 0.6 10*3/uL — ABNORMAL LOW (ref 0.7–4.0)
MCH: 33.6 pg (ref 26.0–34.0)
MCHC: 34.2 g/dL (ref 30.0–36.0)
MCV: 98 fL (ref 78.0–100.0)
MONOS PCT: 7 % (ref 3–12)
Monocytes Absolute: 1.3 10*3/uL — ABNORMAL HIGH (ref 0.1–1.0)
NEUTROS ABS: 16.7 10*3/uL — AB (ref 1.7–7.7)
Neutrophils Relative %: 90 % — ABNORMAL HIGH (ref 43–77)
Platelets: 292 10*3/uL (ref 150–400)
RBC: 4.5 MIL/uL (ref 3.87–5.11)
RDW: 12.7 % (ref 11.5–15.5)
WBC: 18.6 10*3/uL — ABNORMAL HIGH (ref 4.0–10.5)

## 2015-04-25 LAB — URINE MICROSCOPIC-ADD ON

## 2015-04-25 LAB — LIPASE, BLOOD: LIPASE: 16 U/L — AB (ref 22–51)

## 2015-04-25 LAB — I-STAT CG4 LACTIC ACID, ED
Lactic Acid, Venous: 3.78 mmol/L (ref 0.5–2.0)
Lactic Acid, Venous: 2.09 mmol/L (ref 0.5–2.0)

## 2015-04-25 LAB — I-STAT TROPONIN, ED: Troponin i, poc: 0.01 ng/mL (ref 0.00–0.08)

## 2015-04-25 MED ORDER — DEXTROSE 5 % IV SOLN: 1.0000 g | Freq: Once | INTRAVENOUS | Status: DC

## 2015-04-25 MED ORDER — TRIAMCINOLONE ACETONIDE 0.1 % EX CREA: TOPICAL_CREAM | Freq: Two times a day (BID) | CUTANEOUS | Status: DC | PRN

## 2015-04-25 MED ORDER — METOCLOPRAMIDE HCL 5 MG/ML IJ SOLN
5.0000 mg | Freq: Once | INTRAMUSCULAR | Status: AC
  Administered 2015-04-25: 5 mg via INTRAVENOUS
  Filled 2015-04-25: qty 2

## 2015-04-25 MED ORDER — SODIUM CHLORIDE 0.9 % IV BOLUS (SEPSIS)
1000.0000 mL | Freq: Once | INTRAVENOUS | Status: AC
  Administered 2015-04-25: 1000 mL via INTRAVENOUS

## 2015-04-25 MED ORDER — PIPERACILLIN-TAZOBACTAM 3.375 G IVPB
3.3750 g | Freq: Three times a day (TID) | INTRAVENOUS | Status: DC
  Administered 2015-04-26 – 2015-05-02 (×19): 3.375 g via INTRAVENOUS
  Filled 2015-04-25 (×21): qty 50

## 2015-04-25 MED ORDER — MORPHINE SULFATE 2 MG/ML IJ SOLN
2.0000 mg | INTRAMUSCULAR | Status: DC | PRN
  Administered 2015-04-25 – 2015-04-28 (×6): 2 mg via INTRAVENOUS
  Filled 2015-04-25 (×7): qty 1

## 2015-04-25 MED ORDER — ONDANSETRON HCL 4 MG/2ML IJ SOLN
4.0000 mg | Freq: Once | INTRAMUSCULAR | Status: AC
  Administered 2015-04-25: 4 mg via INTRAVENOUS
  Filled 2015-04-25: qty 2

## 2015-04-25 MED ORDER — PIPERACILLIN-TAZOBACTAM 3.375 G IVPB 30 MIN
3.3750 g | Freq: Once | INTRAVENOUS | Status: AC
  Administered 2015-04-25: 3.375 g via INTRAVENOUS
  Filled 2015-04-25: qty 50

## 2015-04-25 MED ORDER — POLYVINYL ALCOHOL 1.4 % OP SOLN: 2.0000 [drp] | Freq: Three times a day (TID) | OPHTHALMIC | Status: DC | PRN

## 2015-04-25 MED ORDER — ACETAMINOPHEN 325 MG PO TABS
650.0000 mg | ORAL_TABLET | Freq: Four times a day (QID) | ORAL | Status: DC | PRN
  Administered 2015-05-02: 650 mg via ORAL
  Filled 2015-04-25: qty 2

## 2015-04-25 MED ORDER — ONDANSETRON HCL 4 MG PO TABS: 4.0000 mg | ORAL_TABLET | Freq: Four times a day (QID) | ORAL | Status: DC | PRN

## 2015-04-25 MED ORDER — SODIUM CHLORIDE 0.9 % IV SOLN
INTRAVENOUS | Status: AC
  Administered 2015-04-26: via INTRAVENOUS

## 2015-04-25 MED ORDER — KETOROLAC TROMETHAMINE 15 MG/ML IJ SOLN
15.0000 mg | Freq: Four times a day (QID) | INTRAMUSCULAR | Status: AC
  Administered 2015-04-26 – 2015-04-30 (×20): 15 mg via INTRAVENOUS
  Filled 2015-04-25 (×20): qty 1

## 2015-04-25 MED ORDER — IOHEXOL 300 MG/ML  SOLN: 25.0000 mL | Freq: Once | INTRAMUSCULAR | Status: DC | PRN

## 2015-04-25 MED ORDER — MOMETASONE FURO-FORMOTEROL FUM 100-5 MCG/ACT IN AERO
2.0000 | INHALATION_SPRAY | Freq: Two times a day (BID) | RESPIRATORY_TRACT | Status: DC
  Administered 2015-04-26 – 2015-05-02 (×12): 2 via RESPIRATORY_TRACT
  Filled 2015-04-25 (×2): qty 8.8

## 2015-04-25 MED ORDER — POTASSIUM CHLORIDE 10 MEQ/100ML IV SOLN
10.0000 meq | INTRAVENOUS | Status: AC
  Administered 2015-04-26 (×4): 10 meq via INTRAVENOUS
  Filled 2015-04-25 (×4): qty 100

## 2015-04-25 MED ORDER — ONDANSETRON HCL 4 MG/2ML IJ SOLN
4.0000 mg | Freq: Four times a day (QID) | INTRAMUSCULAR | Status: DC | PRN
  Administered 2015-04-27: 4 mg via INTRAVENOUS
  Filled 2015-04-25: qty 2

## 2015-04-25 MED ORDER — TIOTROPIUM BROMIDE MONOHYDRATE 18 MCG IN CAPS
18.0000 ug | ORAL_CAPSULE | Freq: Every day | RESPIRATORY_TRACT | Status: DC
  Administered 2015-04-26 – 2015-05-02 (×6): 18 ug via RESPIRATORY_TRACT
  Filled 2015-04-25 (×2): qty 5

## 2015-04-25 MED ORDER — ALBUTEROL SULFATE (2.5 MG/3ML) 0.083% IN NEBU: 3.0000 mL | INHALATION_SOLUTION | Freq: Four times a day (QID) | RESPIRATORY_TRACT | Status: DC | PRN

## 2015-04-25 MED ORDER — MORPHINE SULFATE 4 MG/ML IJ SOLN
4.0000 mg | Freq: Once | INTRAMUSCULAR | Status: AC
  Administered 2015-04-25: 4 mg via INTRAVENOUS
  Filled 2015-04-25: qty 1

## 2015-04-25 MED ORDER — IOHEXOL 300 MG/ML  SOLN
100.0000 mL | Freq: Once | INTRAMUSCULAR | Status: AC | PRN
  Administered 2015-04-25: 100 mL via INTRAVENOUS

## 2015-04-25 MED ORDER — ACETAMINOPHEN 650 MG RE SUPP: 650.0000 mg | Freq: Four times a day (QID) | RECTAL | Status: DC | PRN

## 2015-04-25 MED ORDER — PANTOPRAZOLE SODIUM 40 MG IV SOLR
40.0000 mg | Freq: Every day | INTRAVENOUS | Status: DC
  Administered 2015-04-26 – 2015-04-27 (×3): 40 mg via INTRAVENOUS
  Filled 2015-04-25 (×4): qty 40

## 2015-04-25 NOTE — H&P (Signed)
Date: 04/25/2015               Patient Name:  Jennifer [name] MRN: [id number]  DOB: 1941/08/11 Age / Sex: 74 y.o., female   PCP: Bertha Stakes, MD         Medical Service: Internal Medicine Teaching Service         Attending Physician: Dr. Annia Belt, MD    First Contact: Dr. Charlott Rakes Pager: [phone]  Second Contact: Dr. Natasha Bence Pager: [phone]       After Hours (After 5p/  First Contact Pager: [phone]  weekends / holidays): Second Contact Pager: [phone]   Chief Complaint: abdominal pain  History of Present Illness: [name] is a 74 year old woman with HTN, chronic diastolic HF, COPD, fibromyalgia here for abdominal pain. She was in her usual state of health until about three days ago when she began to feel general malaise. For the past two days she has had abdominal pain with associated nausea and non-bloody but questionably bilious emesis. The pain is right sided, non-radiating, aching pain. She has also had a few days of diarrhea but none today. She has had decreased po intake and says today all she ate was a piece of bread and a few spoonfuls of oatmeal. Today she had subjective fever and checked it at home and it was reportedly 103.56F so she came to ED.  In the ED, she had CT abd/pelvis that showed at least two gallstones lodged in the gallbladder neck with marked gallbladder wall thickening and pericholecystic fluid compatible with acute cholecystitis. She was seen by general surgery in the ED who recommended antibiotics and urgent cholecystectomy. She has received zosyn 3.375 mg iv once, 1 L NS, zofran 4 mg iv once, morphine 4 mg iv x 2, and reglan 5 mg iv once.  Meds: Current Facility-Administered Medications  Medication Dose Route Frequency Provider Last Rate Last Dose  . 0.9 %  sodium chloride infusion   Intravenous Continuous Wilber Oliphant, MD      . acetaminophen (TYLENOL) tablet 650 mg  650 mg Oral Q6H PRN Wilber Oliphant, MD       Or  . acetaminophen  (TYLENOL) suppository 650 mg  650 mg Rectal Q6H PRN Wilber Oliphant, MD      . albuterol (PROVENTIL) (2.5 MG/3ML) 0.083% nebulizer solution 3 mL  3 mL Inhalation Q6H PRN Wilber Oliphant, MD      . Derrill Memo ON 04/26/2015] ketorolac (TORADOL) 15 MG/ML injection 15 mg  15 mg Intravenous 4 times per day Wilber Oliphant, MD      . mometasone-formoterol [facility]) 100-5 MCG/ACT inhaler 2 puff  2 puff Inhalation BID Wilber Oliphant, MD   2 puff at 04/25/15 2344  . morphine 2 MG/ML injection 2 mg  2 mg Intravenous Q4H PRN Wilber Oliphant, MD      . ondansetron [facility]) tablet 4 mg  4 mg Oral Q6H PRN Wilber Oliphant, MD       Or  . ondansetron [facility]) injection 4 mg  4 mg Intravenous Q6H PRN Wilber Oliphant, MD      . pantoprazole (PROTONIX) injection 40 mg  40 mg Intravenous QHS Wilber Oliphant, MD      . Derrill Memo ON 04/26/2015] piperacillin-tazobactam (ZOSYN) IVPB 3.375 g  3.375 g Intravenous 3 times per day Wendee Beavers, RPH      . polyvinyl alcohol (LIQUIFILM TEARS) 1.4 % ophthalmic solution 2 drop  2 drop Both Eyes TID PRN Wilber Oliphant, MD      . potassium chloride 10 mEq in 100 mL IVPB  10 mEq Intravenous Q1 Hr x 4 Wilber Oliphant, MD      . Derrill Memo ON 04/26/2015] tiotropium (SPIRIVA) inhalation capsule 18 mcg  18 mcg Inhalation Daily Wilber Oliphant, MD        Allergies: Allergies as of 04/25/2015 - Review Complete 04/25/2015  Allergen Reaction Noted  . Flonase [fluticasone] Cough 08/12/2012  . Lisinopril Other (See Comments) 01/06/2011  . Tetracycline  12/29/2006   Past Medical History  Diagnosis Date  . COPD (chronic obstructive pulmonary disease)     O2 dependent. Followed by Dr. Joya Gaskins  . Fibromyalgia   . Hypertension   . OSA (obstructive sleep apnea)     Sleep study done April 02, 2008 showed severe obstructive sleep apnea.  . Diastolic dysfunction     Grade I diastolic dysfunction as shown on ECHO Dec 2011.  EF of 65-70%.  Marland Kitchen COPD (chronic obstructive pulmonary disease)      Followed by Dr. Joya Gaskins  . Dyslipidemia   . Osteopenia     DXA scan done on 10/23/2009 showed osteopenia of AP spine (young adult T-score -1.3), osteopenia of left femur neck (young adult T-score -1.8), and normal bone density of right femur neck (young adult T-score -0.8), with a FRAX estimate of the 10-year probability of a major osteoporotic fracture of 9.7% and probability of hip fracture 1.6%.  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 Estanislado Pandy on 11/10/2004.  MRA of the head on 03/09/2012 showed no evidence for recanalization.  . Osteoarthrosis involving multiple sites   . Chronic back pain   . Depression   . Anxiety   . Anemia   . GERD (gastroesophageal reflux disease)   . Meniere disease     Followed by ENT Dr. Silvestre Moment  . Diverticulosis of colon   . Multiple pigmented nevi   . Hypokalemia   . S/P hysterectomy 1975  . Pneumonia 11/2004  . Knee pain   . Dysphagia   . Urosepsis 11/04/2010    Klebsiella pneumoniae  . Breast pain   . Allergic rhinitis   . Fibromyalgia   . 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     . Urinary incontinence    Past Surgical History  Procedure Laterality Date  . Abdominal hysterectomy  1975  . 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 Estanislado Pandy on 11/10/2004.   Family History  Problem Relation Age of Onset  . Ulcers Mother   . Ovarian cancer Sister 27  . Cervical cancer Daughter   . Cervical cancer Daughter   . Lung cancer Neg Hx   . Colon cancer Neg Hx   . Heart attack Father   . Cerebral aneurysm Sister 21   History   Social History  . Marital Status:  Widowed    Spouse Name: N/A  . Number of Children: N/A  . Years of Education: N/A   Occupational History  . Not on file.   Social History Main Topics  . Smoking status: Former Smoker -- 0.50 packs/day  for 30 years    Types: Cigarettes    Quit date: 12/07/2008  . Smokeless tobacco: Former Systems developer    Types: [place] date: 12/06/1978  . Alcohol Use: No  . Drug Use: No  . Sexual Activity: No   Other Topics Concern  . Not on file   Social History Narrative   Financial assistance approved for 100% discount after Medicare pays for [facility] only, not eligible for [facility] card. Per Bonna Gains    Review of Systems: Review of systems negative except as noted above per HPI  Physical Exam: Blood pressure 114/65, pulse 100, temperature 102.4 F (39.1 C), temperature source Oral, resp. rate 18, height 5\' 3"  (1.6 m), weight 226 lb 4.8 oz (102.649 kg), SpO2 99 %.  Gen: In discomfort, well developed, well nourished HEENT: Atraumatic, PERRL, EOMI, sclerae anicteric, very dry mucous membranes Heart: Tachycardic but regular, normal S1 S2, no murmurs, rubs, or gallops Lungs: Clear to auscultation bilaterally, respirations unlabored Abd: Soft, diffuse tenderness worse on R side, reports equally painful on pushing down and lifting up, some guarding, non-distended, + bowel sounds, no hepatosplenomegaly Ext: No edema or cyanosis  Lab results: Basic Metabolic Panel:  Recent Labs  04/25/15 1506  NA 136  K 3.0*  CL 105  CO2 18*  GLUCOSE 165*  BUN 12  CREATININE 0.81  CALCIUM 9.9   Liver Function Tests:  Recent Labs  04/25/15 1506  AST 25  ALT 15  ALKPHOS 73  BILITOT 0.4  PROT 7.8  ALBUMIN 3.6    Recent Labs  04/25/15 1506  LIPASE 16*   CBC:  Recent Labs  04/25/15 1506  WBC 18.6*  NEUTROABS 16.7*  HGB 15.1*  HCT 44.1  MCV 98.0  PLT 292  90% neutrophil, 3% lymphocyte, 7% monocyte  Urinalysis:  Recent Labs  04/25/15 1656  COLORURINE AMBER*  LABSPEC 1.023    PHURINE 7.5  GLUCOSEU NEGATIVE  HGBUR NEGATIVE  BILIRUBINUR SMALL*  KETONESUR NEGATIVE  PROTEINUR 30*  UROBILINOGEN 1.0  NITRITE NEGATIVE  LEUKOCYTESUR SMALL*  rare squam, 3-6 WBC, 0-2 RBC, rare bacteria   i-stat lactic acid 3.78-->2.09 i-stat trop 0.01  Imaging results:  Dg Chest 2 View  04/25/2015   CLINICAL DATA:  Recent tripping injury with persistence shortness of Breath  EXAM: CHEST  2 VIEW  COMPARISON:  01/08/2014  FINDINGS: Cardiac shadow is stable. The lungs are clear without evidence of pneumothorax. No focal infiltrate is seen. No acute bony abnormality is noted. No pneumothorax is noted. Chronic degenerative changes of the acromioclavicular joints are seen.  IMPRESSION: No acute abnormality noted.   Electronically Signed   By: Inez Catalina M.D.   On: 04/25/2015 16:29   Ct Abdomen Pelvis W Contrast  04/25/2015   CLINICAL DATA:  Right side abdominal pain for 3 days. Nausea, vomiting.  EXAM: CT ABDOMEN AND PELVIS WITH CONTRAST  TECHNIQUE: Multidetector CT imaging of the abdomen and pelvis was performed using the standard protocol following bolus administration of intravenous contrast.  CONTRAST:  170mL OMNIPAQUE IOHEXOL 300 MG/ML  SOLN  COMPARISON:  None.  FINDINGS: Lower chest: Lung bases are clear. No effusions. Heart is normal size.  Hepatobiliary: There is gallbladder wall thickening and pericholecystic fluid compatible with acute cholecystitis. At least 2 gallstones within the gallbladder neck, the largest 10 mm. Common bile duct is dilated, measuring 10 mm. No visible distal ductal stone. Mild intrahepatic biliary ductal dilatation. No focal hepatic abnormality.  Pancreas: Fatty replacement without focal mass.  Spleen: No focal abnormality.  Normal size.  Adrenals/Urinary Tract: Fullness of the adrenal glands bilaterally compatible with hyperplasia. No focal renal abnormality or hydronephrosis. Urinary bladder is decompressed, grossly unremarkable.  Stomach/Bowel: Sigmoid  diverticulosis. No active diverticulitis. Stomach and small bowel are unremarkable.  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 No retroperitoneal or mesenteric adenopathy.  Reproductive: Prior and hysterectomy.  No adnexal masses.  Other: No free fluid or free air.  Musculoskeletal: No focal bone lesion or acute bony abnormality.  IMPRESSION: At least 2 gallstones lodged within the gallbladder neck. Marked gallbladder wall thickening and pericholecystic fluid compatible with acute cholecystitis.  Biliary ductal dilatation also noted. No visible ductal stones, but a distal CBD stone cannot be excluded.   Electronically Signed   By: Rolm Baptise M.D.   On: 04/25/2015 18:19    Other results: EKG: sinus tachycardia but poor quality due to baseline wandering, no prior  Assessment & Plan by Problem: Active Problems:   Acute cholecystitis   #Sepsis secondary to acute cholecystitis: Ms Briceno has Tmax 102.4 while here, leukocytosis 18.6 with 90% neutrophils, tachycardia and tachypnea. Her abdominal pain is likely due to the acute cholecystitis visualized on CT abd/pelvis. She was placed on zosyn in the ED and general surgery felt she will need urgent cholecystectomy once medically stable.  -appreciate surgery -urgent cholecystectomy per surgery -NPO -cont zosyn per pharmacy -morphine 2 mg q4hprn -ketorolac 15 mg iv q6h -zofran 4 mg po or iv q6hprn -tylenol 650 mg po or pr q6hprn -protonix 40 mg iv qhs -NS @ 100 cc/hr -BCx x 2 -lactic acid  #Hypokalemia: K 3.0 on presentation. Likely due to emesis -KCl 40 mEq iv -check Mg  #HTN: BP 100s-130s/70s-80s. At home she is on HCTZ 12.5 mg daily, nifedipine 90 mg daily, ASA 81 mg daily. -hold HCTZ 12.5 mg daily, nifedipine 90 mg daily, ASA 81 mg daily while NPO  #Chronic Diastolic CHF: Last TTE 42/7062 w EF 37-62%, grade 1 diastolic dysfunction. At home she is on ASA 81 mg  daily. -hold ASA 81 mg daily while NPO  #COPD: At home she is on advair 1 puff bid, spiriva 18 mch inh daily, and albuterol 1-2 puff q6hprn. Do not see PFT in EMR. -cont dulera1 puff bid, spiriva 18 mch inh daily, and albuterol 1-2 puff q6hprn  #Fibromyalgia: At home she takes norco 7.5-325 1 tab qidprn, amitriptyline 50 mg qhs, carisoprodol 350 mg dailyprn. -hold norco 7.5-325 1 tab qidprn, amitriptyline 50 mg qhs, carisoprodol 350 mg dailyprn while NPO -see pain management above  #OSA: On CPAP qhs -cont CPAP qhs  #Diet: NPO  #DVT PPx: SCDs preoperatively  #Code: Full  Dispo: Disposition is deferred at this time, awaiting improvement of current medical problems. Anticipated discharge in approximately 2 day(s).   The patient does have a current PCP Bertha Stakes, MD) and does need an [facility] hospital follow-up appointment after discharge.  The patient does not know have transportation limitations that hinder transportation to clinic appointments.  Signed: Lottie Mussel, MD Internal Medicine, PGY-1 Pager [phone] 04/25/2015, 11:48 PM

## 2015-04-25 NOTE — ED Notes (Addendum)
Pt reports on 5/14 she tripped over her dog and fell on her right side. Last night reports vomiting, and R sided abd pain, also with fever. Pt took 500 mg tylenol at 1315 for fever.

## 2015-04-25 NOTE — ED Notes (Signed)
NOTIFIED DR. LOCKWOOD IN PERSON FOR PATIENTS LAB RESULTS OF CG4+LACTIC ACID @19 :10PM ,04/25/2015.

## 2015-04-25 NOTE — Telephone Encounter (Signed)
Would rec ER as she will need more extensive eval than [facility] can provide in a timely manner (Xrays, IVF)

## 2015-04-25 NOTE — ED Notes (Signed)
Transporting patient to new room assignment. 

## 2015-04-25 NOTE — ED Notes (Signed)
Patient transported to X-ray, and possibly CT

## 2015-04-25 NOTE — Progress Notes (Signed)
ANTIBIOTIC CONSULT NOTE - INITIAL  Pharmacy Consult for Zosyn Indication: : Intra-abdominal Infection, Sepsis secondary to acute cholecystitis  Allergies  Allergen Reactions  . Flonase [Fluticasone] Cough  . Lisinopril Other (See Comments)    Cough  . Tetracycline     REACTION: stomach upset    Patient Measurements: Height: 5\' 3"  (160 cm) Weight: 226 lb 4.8 oz (102.649 kg) (bed scale; pt states she can't stand right now) IBW/kg (Calculated) : 52.4  Vital Signs: Temp: 102.4 F (39.1 C) (05/20 2145) Temp Source: Oral (05/20 2145) BP: 114/65 mmHg (05/20 2145) Pulse Rate: 100 (05/20 2145) Intake/Output from previous day:   Intake/Output from this shift:    Labs:  Recent Labs  04/25/15 1506  WBC 18.6*  HGB 15.1*  PLT 292  CREATININE 0.81   Estimated Creatinine Clearance: 67.6 mL/min (by C-G formula based on Cr of 0.81). No results for input(s): VANCOTROUGH, VANCOPEAK, VANCORANDOM, GENTTROUGH, GENTPEAK, GENTRANDOM, TOBRATROUGH, TOBRAPEAK, TOBRARND, AMIKACINPEAK, AMIKACINTROU, AMIKACIN in the last 72 hours.   Microbiology: No results found for this or any previous visit (from the past 720 hour(s)).  Medical History: Past Medical History  Diagnosis Date  . COPD (chronic obstructive pulmonary disease)     O2 dependent. Followed by Dr. Joya Gaskins  . Fibromyalgia   . Hypertension   . OSA (obstructive sleep apnea)     Sleep study done April 02, 2008 showed severe obstructive sleep apnea.  . Diastolic dysfunction     Grade I diastolic dysfunction as shown on ECHO Dec 2011.  EF of 65-70%.  Marland Kitchen COPD (chronic obstructive pulmonary disease)     Followed by Dr. Joya Gaskins  . Dyslipidemia   . Osteopenia     DXA scan done on 10/23/2009 showed osteopenia of AP spine (young adult T-score -1.3), osteopenia of left femur neck (young adult T-score -1.8), and normal bone density of right femur neck (young adult T-score -0.8), with a FRAX estimate of the 10-year probability of a major  osteoporotic fracture of 9.7% and probability of hip fracture 1.6%.  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 Estanislado Pandy on 11/10/2004.  MRA of the head on 03/09/2012 showed no evidence for recanalization.  . Osteoarthrosis involving multiple sites   . Chronic back pain   . Depression   . Anxiety   . Anemia   . GERD (gastroesophageal reflux disease)   . Meniere disease     Followed by ENT Dr. Silvestre Moment  . Diverticulosis of colon   . Multiple pigmented nevi   . Hypokalemia   . S/P hysterectomy 1975  . Pneumonia 11/2004  . Knee pain   . Dysphagia   . Urosepsis 11/04/2010    Klebsiella pneumoniae  . Breast pain   . Allergic rhinitis   . Fibromyalgia   . 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     . Urinary incontinence     Medications:  Prescriptions prior to admission  Medication Sig Dispense Refill Last Dose  . albuterol (PROAIR HFA) 108 (90 BASE) MCG/ACT inhaler INHALE 2 PUFFS BY MOUTH INTO LUNGS FOUR TIMES DAILY AS NEEDED (Patient taking differently: Inhale 1-2 puffs into the lungs every 6 (six) hours as needed for wheezing or shortness of breath. ) 3  Inhaler 6 Past Week at Unknown time  . ALPRAZolam (XANAX) 0.5 MG tablet Take 1-1.5 tablets (0.5-0.75 mg total) by mouth 3 (three) times daily as needed for anxiety. (Patient taking differently: Take 0.5-1 mg by mouth at bedtime as needed and may repeat dose one time if needed for anxiety. Take 1 mg daily at bedtime and take 0.5 mg as needed throughout the day for anxiety) 120 tablet 3 04/23/2015 at Unknown time  . amitriptyline (ELAVIL) 50 MG tablet Take 1 tablet (50 mg total) by mouth at bedtime. 30 tablet 3 04/23/2015  . aspirin 81 MG EC  tablet Take 81 mg by mouth daily.     04/23/2015  . Calcium Carb-Cholecalciferol 600-800 MG-UNIT TABS Take 1 tablet by mouth 2 (two) times daily.   04/23/2015  . carisoprodol (SOMA) 350 MG tablet Take 1 tablet (350 mg total) by mouth daily as needed for muscle spasms. (Patient taking differently: Take 350 mg by mouth daily. ) 30 tablet 3 04/23/2015  . Cholecalciferol (VITAMIN D3) 1000 UNITS CAPS Take 1 capsule (1,000 Units total) by mouth daily. 30 capsule 2 04/23/2015  . CINNAMON PO Take by mouth 2 (two) times daily.   04/23/2015  . dextromethorphan-guaiFENesin (MUCINEX DM) 30-600 MG per 12 hr tablet Take 1 tablet by mouth 2 (two) times daily.   04/23/2015  . docusate sodium (COLACE) 100 MG capsule Take 100 mg by mouth daily as needed for mild constipation or moderate constipation.   1 month ago  . Fluticasone-Salmeterol (ADVAIR DISKUS) 250-50 MCG/DOSE AEPB Inhale 1 puff into the lungs 2 (two) times daily. 180 each 4 04/24/2015 at Unknown time  . hydrochlorothiazide (HYDRODIURIL) 25 MG tablet Take 0.5 tablets (12.5 mg total) by mouth daily. 45 tablet 3 04/23/2015  . HYDROcodone-acetaminophen (NORCO) 7.5-325 MG per tablet Take 1 tablet by mouth 4 (four) times daily as needed for moderate pain or severe pain. 120 tablet 0 04/24/2015 at Unknown time  . hydroxypropyl methylcellulose (ISOPTO TEARS) 2.5 % ophthalmic solution Place 2 drops into both eyes 3 (three) times daily as needed. Dry eye   Past Week at Unknown time  . ibuprofen (ADVIL,MOTRIN) 200 MG tablet Take 200 mg by mouth 4 (four) times daily as needed for moderate pain. For pain    04/24/2015 at Unknown time  . meclizine (ANTIVERT) 12.5 MG tablet Take 1 tablet (12.5 mg total) by mouth 2 (two) times daily as needed for dizziness. 180 tablet 0 04/24/2015 at Unknown time  . Multiple Vitamin (MULTIVITAMIN) tablet Take 1 tablet by mouth daily.   04/23/2015  . NIFEdipine (ADALAT CC) 90 MG 24 hr tablet Take 1 tablet (90 mg total) by mouth daily. 90 tablet 2  04/23/2015  . omeprazole (PRILOSEC) 20 MG capsule Take 1 capsule (20 mg total) by mouth 2 (two) times daily. 180 capsule 0 04/23/2015  . potassium chloride (K-DUR) 10 MEQ tablet Take 2 tablets (20 mEq total) by mouth 2 (two) times daily. 360 tablet 1 04/23/2015  . ranitidine (ZANTAC) 150 MG tablet Take 1 tablet (150 mg total) by mouth at bedtime. 90 tablet 1 04/23/2015  . Red Yeast Rice Extract (RED YEAST RICE PO) Take by mouth 2 (two) times daily.   04/23/2015  . sertraline (ZOLOFT) 100 MG tablet Take 1 tablet (100 mg total) by mouth 2 (two) times daily. 180 tablet 1 04/23/2015  . tiotropium (SPIRIVA HANDIHALER) 18 MCG inhalation capsule Place 1 capsule (18 mcg total) into inhaler and inhale daily. 90 capsule 4 04/24/2015 at Unknown time  .  topiramate (TOPAMAX) 50 MG tablet Take 1 tablet (50 mg total) by mouth 2 (two) times daily. 60 tablet 2 04/23/2015  . triamcinolone cream (KENALOG) 0.1 % APPLY TOPICALLY TO THE AFFECTED AREA OF HAND TWICE DAILY AS NEEDED (Patient taking differently: APPLY TOPICALLY TO THE AFFECTED AREA OF HAND TWICE DAILY AS NEEDED FOR HAND) 45 g 1 over 30 days   Scheduled:  . mometasone-formoterol  2 puff Inhalation BID  . potassium chloride  10 mEq Intravenous Q1 Hr x 4  . [START ON 04/26/2015] tiotropium  18 mcg Inhalation Daily   Assessment: 74 y.o female with an intra-abdominal Infection, sepsis due to acute cholecystitis. Tc =102.4. WBC = 18.6K.  First dose of Zosyn 3.375 gm IV x1 given on 5/20 @ 20:03.  SCr = 0.81, estimated CrCl is ~ 67 ml/min  Goal of Therapy:  Appropriate antibiotic dosing for patient's renal function and treat infection  Plan:  Zosyn 3.375 gm IV q8h (4hour infusion) Monitor clinical status, renal function, culture results daily.  Thank you for allowing pharmacy to be part of this patients care team. Nicole Cella, RPh Clinical Pharmacist Pager: [phone] 04/25/2015,11:37 PM

## 2015-04-25 NOTE — ED Notes (Signed)
Pt transported to CT ?

## 2015-04-25 NOTE — Telephone Encounter (Signed)
Pt 's daughter called stating pt fell 6 days ago and is not doing well.  She tripped over a cord and the dog landing on her side.  Denies LOC.  Today daughter states pt is not eating well, sips of Ginger Ale. She has had projectile vomiting since last night but improved over last 3 hours. She now has a fever of 103.4 Pain to right shoulder and left side.  No bruising noted.Pain to touch. Pt is alert and talking.  I asked her if she is able to get up and walk around and she said yes but I stagger.    Do you think the ED would be best for her?  Phone # [phone]

## 2015-04-25 NOTE — Consult Note (Signed)
Reason for Consult:cholecystitis Referring Physician: Dr. [name] is an 74 y.o. female.  HPI: Patient is a 74 year old female with a past medical history significant for COPD, hypertension, diastolic heart dysfunction, sleep apnea on CPAP, with approximately 2-3 days of malaise and 2 day history of abdominal pain and nausea and vomiting. Patient states that she had a fever today up to 103.  Patient states his first recurrence of abdominal pain similar to this. Patient's had no diarrhea or other sick contacts.    Past Medical History  Diagnosis Date  . COPD (chronic obstructive pulmonary disease)     O2 dependent. Followed by Jennifer [name]  . Fibromyalgia   . Hypertension   . OSA (obstructive sleep apnea)     Sleep study done April 02, 2008 showed severe obstructive sleep apnea.  . Diastolic dysfunction     Grade I diastolic dysfunction as shown on ECHO Dec 2011.  EF of 65-70%.  Marland Kitchen COPD (chronic obstructive pulmonary disease)     Followed by Jennifer [name]  . Dyslipidemia   . Osteopenia     DXA scan done on 10/23/2009 showed osteopenia of AP spine (young adult T-score -1.3), osteopenia of left femur neck (young adult T-score -1.8), and normal bone density of right femur neck (young adult T-score -0.8), with a FRAX estimate of the 10-year probability of a major osteoporotic fracture of 9.7% and probability of hip fracture 1.6%.  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  MRA of the head on 03/09/2012 showed no evidence for recanalization.  . Osteoarthrosis involving multiple sites   . Chronic back pain   . Depression   . Anxiety   . Anemia   . GERD (gastroesophageal reflux disease)   . Meniere disease     Followed by ENT Jennifer [name]  . Diverticulosis of colon   . Multiple pigmented nevi   . Hypokalemia   . S/P hysterectomy 1975  . Pneumonia 11/2004  . Knee pain   .  Dysphagia   . Urosepsis 11/04/2010    Klebsiella pneumoniae  . Breast pain   . Allergic rhinitis   . Fibromyalgia   . 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     . Urinary incontinence     Past Surgical History  Procedure Laterality Date  . Abdominal hysterectomy  1975  . 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    Family History  Problem Relation Age of Onset  . Ulcers Mother   . Ovarian cancer Sister 23  . Cervical cancer Daughter   . Cervical cancer Daughter   . Lung cancer Neg Hx   . Colon cancer Neg Hx   . Heart attack Father   . Cerebral aneurysm Sister 85    Social History:  reports that she quit smoking about 6 years ago. Her smoking use included Cigarettes. She has a 15 pack-year smoking history. She quit smokeless tobacco use about 36 years ago. Her smokeless tobacco use included Chew. She reports that she does not drink alcohol or use illicit drugs.  Allergies:  Allergies  Allergen Reactions  .  Flonase [Fluticasone] Cough  . Lisinopril Other (See Comments)    Cough  . Tetracycline     REACTION: stomach upset    Medications: I have reviewed the patient's current medications.  Results for orders placed or performed during the hospital encounter of 04/25/15 (from the past 48 hour(s))  CBC with Differential     Status: Abnormal   Collection Time: 04/25/15  3:06 PM  Result Value Ref Range   WBC 18.6 (H) 4.0 - 10.5 K/uL   RBC 4.50 3.87 - 5.11 MIL/uL   Hemoglobin 15.1 (H) 12.0 - 15.0 g/dL   HCT 44.1 36.0 - 46.0 %   MCV 98.0 78.0 - 100.0 fL   MCH 33.6 26.0 - 34.0 pg   MCHC 34.2 30.0 - 36.0 g/dL   RDW 12.7 11.5 - 15.5 %    Platelets 292 150 - 400 K/uL   Neutrophils Relative % 90 (H) 43 - 77 %   Lymphocytes Relative 3 (L) 12 - 46 %   Monocytes Relative 7 3 - 12 %   Eosinophils Relative 0 0 - 5 %   Basophils Relative 0 0 - 1 %   Neutro Abs 16.7 (H) 1.7 - 7.7 K/uL   Lymphs Abs 0.6 (L) 0.7 - 4.0 K/uL   Monocytes Absolute 1.3 (H) 0.1 - 1.0 K/uL   Eosinophils Absolute 0.0 0.0 - 0.7 K/uL   Basophils Absolute 0.0 0.0 - 0.1 K/uL   RBC Morphology POLYCHROMASIA PRESENT    WBC Morphology MILD LEFT SHIFT (1-5% METAS, OCC MYELO, OCC BANDS)     Comment: TOXIC GRANULATION  Comprehensive metabolic panel     Status: Abnormal   Collection Time: 04/25/15  3:06 PM  Result Value Ref Range   Sodium 136 135 - 145 mmol/L   Potassium 3.0 (L) 3.5 - 5.1 mmol/L   Chloride 105 101 - 111 mmol/L   CO2 18 (L) 22 - 32 mmol/L   Glucose, Bld 165 (H) 65 - 99 mg/dL   BUN 12 6 - 20 mg/dL   Creatinine, Ser 0.81 0.44 - 1.00 mg/dL   Calcium 9.9 8.9 - 10.3 mg/dL   Total Protein 7.8 6.5 - 8.1 g/dL   Albumin 3.6 3.5 - 5.0 g/dL   AST 25 15 - 41 U/L   ALT 15 14 - 54 U/L   Alkaline Phosphatase 73 38 - 126 U/L   Total Bilirubin 0.4 0.3 - 1.2 mg/dL   GFR calc non Af Amer >60 >60 mL/min   GFR calc Af Amer >60 >60 mL/min    Comment: (NOTE) The eGFR has been calculated using the CKD EPI equation. This calculation has not been validated in all clinical situations. eGFR's persistently <60 mL/min signify possible Chronic Kidney Disease.    Anion gap 13 5 - 15  Lipase, blood     Status: Abnormal   Collection Time: 04/25/15  3:06 PM  Result Value Ref Range   Lipase 16 (L) 22 - 51 U/L  I-stat troponin, ED (only if pt is 76 y.o. or older & pain is above umbilicus)  not at [facility], [facility]     Status: None   Collection Time: 04/25/15  3:12 PM  Result Value Ref Range   Troponin i, poc 0.01 0.00 - 0.08 ng/mL   Comment 3            Comment: Due to the release kinetics of cTnI, a negative result within the first hours of the onset of symptoms does not  rule out  myocardial infarction with certainty. If myocardial infarction is still suspected, repeat the test at appropriate intervals.   I-Stat CG4 Lactic Acid, ED     Status: Abnormal   Collection Time: 04/25/15  3:14 PM  Result Value Ref Range   Lactic Acid, Venous 3.78 (HH) 0.5 - 2.0 mmol/L   Comment NOTIFIED PHYSICIAN   Urinalysis, Routine w reflex microscopic     Status: Abnormal   Collection Time: 04/25/15  4:56 PM  Result Value Ref Range   Color, Urine AMBER (A) YELLOW    Comment: BIOCHEMICALS MAY BE AFFECTED BY COLOR   APPearance CLEAR CLEAR   Specific Gravity, Urine 1.023 1.005 - 1.030   pH 7.5 5.0 - 8.0   Glucose, UA NEGATIVE NEGATIVE mg/dL   Hgb urine dipstick NEGATIVE NEGATIVE   Bilirubin Urine SMALL (A) NEGATIVE   Ketones, ur NEGATIVE NEGATIVE mg/dL   Protein, ur 30 (A) NEGATIVE mg/dL   Urobilinogen, UA 1.0 0.0 - 1.0 mg/dL   Nitrite NEGATIVE NEGATIVE   Leukocytes, UA SMALL (A) NEGATIVE  Urine microscopic-add on     Status: None   Collection Time: 04/25/15  4:56 PM  Result Value Ref Range   Squamous Epithelial / LPF RARE RARE   WBC, UA 3-6 <3 WBC/hpf   RBC / HPF 0-2 <3 RBC/hpf   Bacteria, UA RARE RARE  I-Stat CG4 Lactic Acid, ED     Status: Abnormal   Collection Time: 04/25/15  7:02 PM  Result Value Ref Range   Lactic Acid, Venous 2.09 (HH) 0.5 - 2.0 mmol/L   Comment NOTIFIED PHYSICIAN     Dg Chest 2 View  04/25/2015   CLINICAL DATA:  Recent tripping injury with persistence shortness of Breath  EXAM: CHEST  2 VIEW  COMPARISON:  01/08/2014  FINDINGS: Cardiac shadow is stable. The lungs are clear without evidence of pneumothorax. No focal infiltrate is seen. No acute bony abnormality is noted. No pneumothorax is noted. Chronic degenerative changes of the acromioclavicular joints are seen.  IMPRESSION: No acute abnormality noted.   Electronically Signed   By: Jennifer [name] M.D.   On: 04/25/2015 16:29   Ct Abdomen Pelvis W Contrast  04/25/2015   CLINICAL DATA:  Right  side abdominal pain for 3 days. Nausea, vomiting.  EXAM: CT ABDOMEN AND PELVIS WITH CONTRAST  TECHNIQUE: Multidetector CT imaging of the abdomen and pelvis was performed using the standard protocol following bolus administration of intravenous contrast.  CONTRAST:  166mL OMNIPAQUE IOHEXOL 300 MG/ML  SOLN  COMPARISON:  None.  FINDINGS: Lower chest: Lung bases are clear. No effusions. Heart is normal size.  Hepatobiliary: There is gallbladder wall thickening and pericholecystic fluid compatible with acute cholecystitis. At least 2 gallstones within the gallbladder neck, the largest 10 mm. Common bile duct is dilated, measuring 10 mm. No visible distal ductal stone. Mild intrahepatic biliary ductal dilatation. No focal hepatic abnormality.  Pancreas: Fatty replacement without focal mass.  Spleen: No focal abnormality.  Normal size.  Adrenals/Urinary Tract: Fullness of the adrenal glands bilaterally compatible with hyperplasia. No focal renal abnormality or hydronephrosis. Urinary bladder is decompressed, grossly unremarkable.  Stomach/Bowel: Sigmoid diverticulosis. No active diverticulitis. Stomach and small bowel are unremarkable.  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 No retroperitoneal or mesenteric adenopathy.  Reproductive: Prior and hysterectomy.  No adnexal masses.  Other: No free fluid or free air.  Musculoskeletal: No focal bone lesion or acute bony abnormality.  IMPRESSION: At  least 2 gallstones lodged within the gallbladder neck. Marked gallbladder wall thickening and pericholecystic fluid compatible with acute cholecystitis.  Biliary ductal dilatation also noted. No visible ductal stones, but a distal CBD stone cannot be excluded.   Electronically Signed   By: Jennifer [name] M.D.   On: 04/25/2015 18:19    Review of Systems  Constitutional: Positive for fever. Negative for weight loss.  HENT: Negative.   Eyes: Negative for  blurred vision, double vision, photophobia and pain.  Respiratory: Negative.   Cardiovascular: Negative.  Negative for chest pain.  Gastrointestinal: Positive for heartburn, nausea, vomiting and abdominal pain. Negative for diarrhea, constipation and blood in stool.  Genitourinary: Negative for flank pain.  Musculoskeletal: Negative.   Endo/Heme/Allergies: Does not bruise/bleed easily.   Blood pressure 134/119, pulse 108, temperature 99.6 F (37.6 C), temperature source Oral, resp. rate 21, SpO2 90 %. Physical Exam  Constitutional: She is oriented to person, place, and time. She appears well-developed and well-nourished.  HENT:  Head: Normocephalic and atraumatic.  Eyes: Conjunctivae and EOM are normal. Pupils are equal, round, and reactive to light.  Neck: Normal range of motion. Neck supple.  Cardiovascular: Normal rate, regular rhythm and normal heart sounds.   Respiratory: Effort normal and breath sounds normal.  GI: Soft. Bowel sounds are normal. She exhibits no distension. There is tenderness (RUQ/ RLQ). There is no rebound and no guarding.  Musculoskeletal: Normal range of motion.  Neurological: She is alert and oriented to person, place, and time.  Skin: Skin is warm and dry.    Assessment/Plan: 74 year old female with multiple medical problems and acute cholecystitis.   1. Would recommend antibiotics secondary to cholecystitis 2. Would recommend medicine admit secondary to her multiple medical issues and medical clearance prior to surgery. 3. Patient's lactate currently to likely secondary to dehydration. Would recommend gentle hydration as per medicine. 4. Patient will need urgent cholecystectomy.  Jennifer [name]., Jennifer [name] 04/25/2015, 7:54 PM

## 2015-04-25 NOTE — ED Provider Notes (Signed)
CSN: [id number]     Arrival date & time 04/25/15  1442 History   First MD Initiated Contact with Patient 04/25/15 1531     Chief Complaint  Patient presents with  . Emesis  . Abdominal Pain     (Consider location/radiation/quality/duration/timing/severity/associated sxs/prior Treatment) Patient is a 74 y.o. female presenting with abdominal pain. The history is provided by the patient.  Abdominal Pain Pain location:  RUQ, RLQ and R flank Pain quality: sharp   Pain radiates to:  Does not radiate Pain severity:  Moderate Onset quality:  Gradual Duration:  1 day Timing:  Constant Progression:  Worsening Context: trauma (fell onto R side 1 week ago but no pain until last night)   Context: not recent illness and not suspicious food intake   Relieved by:  Nothing Worsened by:  Nothing tried Ineffective treatments:  Acetaminophen Associated symptoms: anorexia, chills, diarrhea, fever, nausea and vomiting   Associated symptoms: no chest pain, no constipation, no cough, no dysuria, no fatigue, no hematemesis, no hematochezia, no hematuria, no melena and no shortness of breath   Diarrhea:    Quality:  Watery   Severity:  Moderate   Duration:  1 day   Timing:  Constant   Progression:  Unchanged Fever:    Duration:  1 day   Timing:  Intermittent   Max temp PTA (F):  103   Temp source:  Oral Vomiting:    Quality:  Stomach contents   Severity:  Moderate   Duration:  1 day   Timing:  Constant   Progression:  Unchanged Risk factors: being elderly and multiple surgeries     Past Medical History  Diagnosis Date  . COPD (chronic obstructive pulmonary disease)     O2 dependent. Followed by Dr. Joya Gaskins  . Fibromyalgia   . Hypertension   . OSA (obstructive sleep apnea)     Sleep study done April 02, 2008 showed severe obstructive sleep apnea.  . Diastolic dysfunction     Grade I diastolic dysfunction as shown on ECHO Dec 2011.  EF of 65-70%.  Marland Kitchen COPD (chronic obstructive pulmonary  disease)     Followed by Dr. Joya Gaskins  . Dyslipidemia   . Osteopenia     DXA scan done on 10/23/2009 showed osteopenia of AP spine (young adult T-score -1.3), osteopenia of left femur neck (young adult T-score -1.8), and normal bone density of right femur neck (young adult T-score -0.8), with a FRAX estimate of the 10-year probability of a major osteoporotic fracture of 9.7% and probability of hip fracture 1.6%.  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 Estanislado Pandy on 11/10/2004.  MRA of the head on 03/09/2012 showed no evidence for recanalization.  . Osteoarthrosis involving multiple sites   . Chronic back pain   . Depression   . Anxiety   . Anemia   . GERD (gastroesophageal reflux disease)   . Meniere disease     Followed by ENT Dr. Silvestre Moment  . Diverticulosis of colon   . Multiple pigmented nevi   . Hypokalemia   . S/P hysterectomy 1975  . Pneumonia 11/2004  . Knee pain   . Dysphagia   . Urosepsis 11/04/2010    Klebsiella pneumoniae  . Breast pain   . Allergic rhinitis   . Fibromyalgia   . Cerebral ventriculomegaly 03/09/2012    MRI of the brain on 03/09/2012 showed moderate ventricular enlargement out of proportion to the  degree of cortical atrophy, most evident when compared with 2008, with a comment that normal  pressure hydrocephalus is not excluded.     . Urinary incontinence    Past Surgical History  Procedure Laterality Date  . Abdominal hysterectomy  1975  . 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 Estanislado Pandy on 11/10/2004.   Family History  Problem Relation Age of Onset  . Ulcers Mother   . Ovarian cancer Sister 34  . Cervical cancer Daughter   . Cervical cancer Daughter   . Lung cancer Neg Hx   . Colon cancer Neg Hx   . Heart attack Father   . Cerebral aneurysm Sister 65   History  Substance Use Topics  .  Smoking status: Former Smoker -- 0.50 packs/day for 30 years    Types: Cigarettes    Quit date: 12/07/2008  . Smokeless tobacco: Former Systems developer    Types: [place] date: 12/06/1978  . Alcohol Use: No   OB History    No data available     Review of Systems  Constitutional: Positive for fever, chills and appetite change. Negative for diaphoresis and fatigue.  Respiratory: Negative for cough, chest tightness and shortness of breath.   Cardiovascular: Negative for chest pain, palpitations and leg swelling.  Gastrointestinal: Positive for nausea, vomiting, abdominal pain, diarrhea and anorexia. Negative for constipation, blood in stool, melena, hematochezia, abdominal distention and hematemesis.  Genitourinary: Positive for frequency and flank pain. Negative for dysuria, hematuria and difficulty urinating.  Musculoskeletal: Negative for back pain, neck pain and neck stiffness.  Skin: Negative for color change, pallor and rash.  Neurological: Negative for dizziness, light-headedness and headaches.  All other systems reviewed and are negative.     Allergies  Flonase; Lisinopril; and Tetracycline  Home Medications   Prior to Admission medications   Medication Sig Start Date End Date Taking? Authorizing Provider  albuterol (PROAIR HFA) 108 (90 BASE) MCG/ACT inhaler INHALE 2 PUFFS BY MOUTH INTO LUNGS FOUR TIMES DAILY AS NEEDED Patient taking differently: Inhale 1-2 puffs into the lungs every 6 (six) hours as needed for wheezing or shortness of breath.  10/14/14  Yes Elsie Stain, MD  ALPRAZolam Duanne Moron) 0.5 MG tablet Take 1-1.5 tablets (0.5-0.75 mg total) by mouth 3 (three) times daily as needed for anxiety. Patient taking differently: Take 0.5-1 mg by mouth at bedtime as needed and may repeat dose one time if needed for anxiety. Take 1 mg daily at bedtime and take 0.5 mg as needed throughout the day for anxiety 01/03/15  Yes Bertha Stakes, MD  amitriptyline (ELAVIL) 50 MG tablet Take 1  tablet (50 mg total) by mouth at bedtime. 01/03/15  Yes Bertha Stakes, MD  aspirin 81 MG EC tablet Take 81 mg by mouth daily.     Yes Historical Provider, MD  Calcium Carb-Cholecalciferol 600-800 MG-UNIT TABS Take 1 tablet by mouth 2 (two) times daily.   Yes Historical Provider, MD  carisoprodol (SOMA) 350 MG tablet Take 1 tablet (350 mg total) by mouth daily as needed for muscle spasms. Patient taking differently: Take 350 mg by mouth daily.  01/03/15  Yes Bertha Stakes, MD  Cholecalciferol (VITAMIN D3) 1000 UNITS CAPS Take 1 capsule (1,000 Units total) by mouth daily. 01/23/15  Yes Bertha Stakes, MD  CINNAMON PO Take by mouth 2 (two) times daily.   Yes Historical Provider, MD  dextromethorphan-guaiFENesin (MUCINEX DM) 30-600 MG per 12  hr tablet Take 1 tablet by mouth 2 (two) times daily.   Yes Historical Provider, MD  docusate sodium (COLACE) 100 MG capsule Take 100 mg by mouth daily as needed for mild constipation or moderate constipation.   Yes Historical Provider, MD  Fluticasone-Salmeterol (ADVAIR DISKUS) 250-50 MCG/DOSE AEPB Inhale 1 puff into the lungs 2 (two) times daily. 10/14/14  Yes Elsie Stain, MD  hydrochlorothiazide (HYDRODIURIL) 25 MG tablet Take 0.5 tablets (12.5 mg total) by mouth daily. 03/07/14  Yes Bertha Stakes, MD  HYDROcodone-acetaminophen (NORCO) 7.5-325 MG per tablet Take 1 tablet by mouth 4 (four) times daily as needed for moderate pain or severe pain. 04/07/15  Yes Aldine Contes, MD  hydroxypropyl methylcellulose (ISOPTO TEARS) 2.5 % ophthalmic solution Place 2 drops into both eyes 3 (three) times daily as needed. Dry eye   Yes Historical Provider, MD  ibuprofen (ADVIL,MOTRIN) 200 MG tablet Take 200 mg by mouth 4 (four) times daily as needed for moderate pain. For pain    Yes Historical Provider, MD  meclizine (ANTIVERT) 12.5 MG tablet Take 1 tablet (12.5 mg total) by mouth 2 (two) times daily as needed for dizziness. 11/20/14  Yes Bertha Stakes, MD  Multiple Vitamin  (MULTIVITAMIN) tablet Take 1 tablet by mouth daily.   Yes Historical Provider, MD  NIFEdipine (ADALAT CC) 90 MG 24 hr tablet Take 1 tablet (90 mg total) by mouth daily. 09/06/14  Yes Bertha Stakes, MD  omeprazole (PRILOSEC) 20 MG capsule Take 1 capsule (20 mg total) by mouth 2 (two) times daily. 02/05/15  Yes Bertha Stakes, MD  potassium chloride (K-DUR) 10 MEQ tablet Take 2 tablets (20 mEq total) by mouth 2 (two) times daily. 06/11/14  Yes Bertha Stakes, MD  ranitidine (ZANTAC) 150 MG tablet Take 1 tablet (150 mg total) by mouth at bedtime. 03/05/15  Yes Bertha Stakes, MD  Red Yeast Rice Extract (RED YEAST RICE PO) Take by mouth 2 (two) times daily.   Yes Historical Provider, MD  sertraline (ZOLOFT) 100 MG tablet Take 1 tablet (100 mg total) by mouth 2 (two) times daily. 09/06/14  Yes Bertha Stakes, MD  tiotropium (SPIRIVA HANDIHALER) 18 MCG inhalation capsule Place 1 capsule (18 mcg total) into inhaler and inhale daily. 10/14/14  Yes Elsie Stain, MD  topiramate (TOPAMAX) 50 MG tablet Take 1 tablet (50 mg total) by mouth 2 (two) times daily. 04/02/15  Yes Bertha Stakes, MD  triamcinolone cream (KENALOG) 0.1 % APPLY TOPICALLY TO THE AFFECTED AREA OF HAND TWICE DAILY AS NEEDED Patient taking differently: APPLY TOPICALLY TO THE AFFECTED AREA OF HAND TWICE DAILY AS NEEDED FOR HAND 03/07/14  Yes Bertha Stakes, MD   BP 105/68 mmHg  Pulse 90  Temp(Src) 98.8 F (37.1 C) (Oral)  Resp 28  Ht 5\' 3"  (1.6 m)  Wt 230 lb 1.6 oz (104.373 kg)  BMI 40.77 kg/m2  SpO2 94% Physical Exam  Constitutional: She is oriented to person, place, and time. She appears well-developed and well-nourished. No distress.  HENT:  Head: Normocephalic and atraumatic.  Mouth/Throat: Oropharynx is clear and moist.  Eyes: Conjunctivae and EOM are normal. Pupils are equal, round, and reactive to light.  Neck: Normal range of motion. Neck supple.  Cardiovascular: Regular rhythm and intact distal pulses.  Tachycardia present.  Exam reveals no  gallop and no friction rub.   No murmur heard. Pulmonary/Chest: Effort normal and breath sounds normal. No respiratory distress. She has no wheezes. She has no rales.  Abdominal: Soft. Normal appearance and  bowel sounds are normal. She exhibits no distension. There is tenderness in the right upper quadrant, right lower quadrant and epigastric area. There is guarding. There is no rigidity, no rebound, no CVA tenderness, no tenderness at McBurney's point and negative Murphy's sign.  Musculoskeletal: Normal range of motion. She exhibits no edema or tenderness.  Neurological: She is alert and oriented to person, place, and time. GCS eye subscore is 4. GCS verbal subscore is 5. GCS motor subscore is 6.  Skin: Skin is warm and dry. No rash noted. She is not diaphoretic. No erythema. No pallor.  Nursing note and vitals reviewed.   ED Course  Procedures (including critical care time) Labs Review Labs Reviewed  CBC WITH DIFFERENTIAL/PLATELET - Abnormal; Notable for the following:    WBC 18.6 (*)    Hemoglobin 15.1 (*)    Neutrophils Relative % 90 (*)    Lymphocytes Relative 3 (*)    Neutro Abs 16.7 (*)    Lymphs Abs 0.6 (*)    Monocytes Absolute 1.3 (*)    All other components within normal limits  COMPREHENSIVE METABOLIC PANEL - Abnormal; Notable for the following:    Potassium 3.0 (*)    CO2 18 (*)    Glucose, Bld 165 (*)    All other components within normal limits  LIPASE, BLOOD - Abnormal; Notable for the following:    Lipase 16 (*)    All other components within normal limits  URINALYSIS, ROUTINE W REFLEX MICROSCOPIC - Abnormal; Notable for the following:    Color, Urine AMBER (*)    Bilirubin Urine SMALL (*)    Protein, ur 30 (*)    Leukocytes, UA SMALL (*)    All other components within normal limits  MAGNESIUM - Abnormal; Notable for the following:    Magnesium 1.4 (*)    All other components within normal limits  BASIC METABOLIC PANEL - Abnormal; Notable for the following:     Potassium 3.0 (*)    CO2 20 (*)    Glucose, Bld 106 (*)    All other components within normal limits  CBC WITH DIFFERENTIAL/PLATELET - Abnormal; Notable for the following:    WBC 24.0 (*)    Neutrophils Relative % 85 (*)    Lymphocytes Relative 8 (*)    Neutro Abs 20.4 (*)    Monocytes Absolute 1.7 (*)    All other components within normal limits  BASIC METABOLIC PANEL - Abnormal; Notable for the following:    CO2 21 (*)    Calcium 8.7 (*)    All other components within normal limits  GLUCOSE, CAPILLARY - Abnormal; Notable for the following:    Glucose-Capillary 103 (*)    All other components within normal limits  APTT - Abnormal; Notable for the following:    aPTT 42 (*)    All other components within normal limits  PROTIME-INR - Abnormal; Notable for the following:    Prothrombin Time 19.4 (*)    INR 1.63 (*)    All other components within normal limits  I-STAT CG4 LACTIC ACID, ED - Abnormal; Notable for the following:    Lactic Acid, Venous 3.78 (*)    All other components within normal limits  I-STAT CG4 LACTIC ACID, ED - Abnormal; Notable for the following:    Lactic Acid, Venous 2.09 (*)    All other components within normal limits  URINE CULTURE  CULTURE, BLOOD (ROUTINE X 2)  CULTURE, BLOOD (ROUTINE X 2)  BODY FLUID CULTURE  URINE MICROSCOPIC-ADD ON  LACTIC ACID, PLASMA  MAGNESIUM  TROPONIN I  TROPONIN I  TROPONIN I  PHOSPHORUS  I-STAT TROPOININ, ED    Imaging Review Dg Chest 2 View  04/25/2015   CLINICAL DATA:  Recent tripping injury with persistence shortness of Breath  EXAM: CHEST  2 VIEW  COMPARISON:  01/08/2014  FINDINGS: Cardiac shadow is stable. The lungs are clear without evidence of pneumothorax. No focal infiltrate is seen. No acute bony abnormality is noted. No pneumothorax is noted. Chronic degenerative changes of the acromioclavicular joints are seen.  IMPRESSION: No acute abnormality noted.   Electronically Signed   By: Inez Catalina M.D.   On:  04/25/2015 16:29   Ct Abdomen Pelvis W Contrast  04/25/2015   CLINICAL DATA:  Right side abdominal pain for 3 days. Nausea, vomiting.  EXAM: CT ABDOMEN AND PELVIS WITH CONTRAST  TECHNIQUE: Multidetector CT imaging of the abdomen and pelvis was performed using the standard protocol following bolus administration of intravenous contrast.  CONTRAST:  170mL OMNIPAQUE IOHEXOL 300 MG/ML  SOLN  COMPARISON:  None.  FINDINGS: Lower chest: Lung bases are clear. No effusions. Heart is normal size.  Hepatobiliary: There is gallbladder wall thickening and pericholecystic fluid compatible with acute cholecystitis. At least 2 gallstones within the gallbladder neck, the largest 10 mm. Common bile duct is dilated, measuring 10 mm. No visible distal ductal stone. Mild intrahepatic biliary ductal dilatation. No focal hepatic abnormality.  Pancreas: Fatty replacement without focal mass.  Spleen: No focal abnormality.  Normal size.  Adrenals/Urinary Tract: Fullness of the adrenal glands bilaterally compatible with hyperplasia. No focal renal abnormality or hydronephrosis. Urinary bladder is decompressed, grossly unremarkable.  Stomach/Bowel: Sigmoid diverticulosis. No active diverticulitis. Stomach and small bowel are unremarkable.  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 No retroperitoneal or mesenteric adenopathy.  Reproductive: Prior and hysterectomy.  No adnexal masses.  Other: No free fluid or free air.  Musculoskeletal: No focal bone lesion or acute bony abnormality.  IMPRESSION: At least 2 gallstones lodged within the gallbladder neck. Marked gallbladder wall thickening and pericholecystic fluid compatible with acute cholecystitis.  Biliary ductal dilatation also noted. No visible ductal stones, but a distal CBD stone cannot be excluded.   Electronically Signed   By: Rolm Baptise M.D.   On: 04/25/2015 18:19     EKG Interpretation None      MDM    Final diagnoses:  Acute cholecystitis    74 yo F with PMH of HTN, CHF, HPL, ulcerative colitis, COPD, presenting with fever, abdominal pain, N/V/D.  Pt reports tripping and falling onto right side 1 week ago, however symptoms did not start until last night.  Denies head injury during fall.  Tmax at home 103.  Vomiting non-bilious/non-bloody.  Denies hematochezia or melena.  Increased urinary frequency but no dysuria, hematuria.  H/o pyelo and subsequent sepsis in the past per daughter.  S/p appendectomy.  On exam, pt febrile to 100.7, tachycardic in 110s. Normotensive, mentating appropriately.  Exam with TTP to epigastric, RUQ, RLQ areas.  Less severe TTP to R flank.  Mild guarding to abdomen, no rebound, distention. Femoral pulses equal, intact.  No bruising or evidence of trauma. No other acute findings.  Possible diverticulitis vs pyelo vs ulcerative colitis flare vs cholecystitis vs pancreatitis.  Plan for labs, CT A/P, fluids, symptomatic tx.  Labs notable for lactate elevated to 3.78, leukocytosis of 18.  CT shows findings consistent with  acute cholecystitis.  Surgery consulted- advise medical admission and clearance for surgery given multiple co-morbidities, they will follow.  Antibiotics given.  Pt admitted without other acute events during my care.  Discussed with attending Dr. Vanita Panda.     Ellwood Dense, MD 04/26/15 2023  Carmin Muskrat, MD 04/26/15 [phone]

## 2015-04-25 NOTE — Telephone Encounter (Signed)
Daughter informed and will bring pt to ED.

## 2015-04-25 NOTE — ED Notes (Signed)
Pt is done drinking contrast, nurse notified.

## 2015-04-26 ENCOUNTER — Inpatient Hospital Stay (HOSPITAL_COMMUNITY): Payer: Medicare HMO

## 2015-04-26 DIAGNOSIS — J449 Chronic obstructive pulmonary disease, unspecified: Secondary | ICD-10-CM

## 2015-04-26 DIAGNOSIS — Z9989 Dependence on other enabling machines and devices: Secondary | ICD-10-CM

## 2015-04-26 DIAGNOSIS — I10 Essential (primary) hypertension: Secondary | ICD-10-CM

## 2015-04-26 DIAGNOSIS — E876 Hypokalemia: Secondary | ICD-10-CM

## 2015-04-26 DIAGNOSIS — K810 Acute cholecystitis: Secondary | ICD-10-CM

## 2015-04-26 DIAGNOSIS — E872 Acidosis, unspecified: Secondary | ICD-10-CM | POA: Diagnosis present

## 2015-04-26 DIAGNOSIS — Z7982 Long term (current) use of aspirin: Secondary | ICD-10-CM

## 2015-04-26 DIAGNOSIS — G4733 Obstructive sleep apnea (adult) (pediatric): Secondary | ICD-10-CM

## 2015-04-26 DIAGNOSIS — R0602 Shortness of breath: Secondary | ICD-10-CM

## 2015-04-26 DIAGNOSIS — R000 Tachycardia, unspecified: Secondary | ICD-10-CM | POA: Insufficient documentation

## 2015-04-26 DIAGNOSIS — I472 Ventricular tachycardia: Secondary | ICD-10-CM

## 2015-04-26 DIAGNOSIS — Z7951 Long term (current) use of inhaled steroids: Secondary | ICD-10-CM

## 2015-04-26 DIAGNOSIS — I480 Paroxysmal atrial fibrillation: Secondary | ICD-10-CM | POA: Insufficient documentation

## 2015-04-26 DIAGNOSIS — M797 Fibromyalgia: Secondary | ICD-10-CM

## 2015-04-26 DIAGNOSIS — Z79899 Other long term (current) drug therapy: Secondary | ICD-10-CM

## 2015-04-26 DIAGNOSIS — I4729 Other ventricular tachycardia: Secondary | ICD-10-CM | POA: Insufficient documentation

## 2015-04-26 DIAGNOSIS — I5032 Chronic diastolic (congestive) heart failure: Secondary | ICD-10-CM

## 2015-04-26 HISTORY — PX: CT PERC CHOLECYSTOSTOMY: HXRAD817

## 2015-04-26 LAB — BASIC METABOLIC PANEL
ANION GAP: 8 (ref 5–15)
Anion gap: 11 (ref 5–15)
BUN: 17 mg/dL (ref 6–20)
BUN: 14 mg/dL (ref 6–20)
CHLORIDE: 104 mmol/L (ref 101–111)
CO2: 21 mmol/L — ABNORMAL LOW (ref 22–32)
CO2: 20 mmol/L — AB (ref 22–32)
CREATININE: 0.7 mg/dL (ref 0.44–1.00)
Calcium: 9 mg/dL (ref 8.9–10.3)
Calcium: 8.7 mg/dL — ABNORMAL LOW (ref 8.9–10.3)
Chloride: 107 mmol/L (ref 101–111)
Creatinine, Ser: 0.79 mg/dL (ref 0.44–1.00)
GFR calc Af Amer: >60 mL/min (ref >60)
GFR calc Af Amer: >60 mL/min (ref >60)
GFR calc non Af Amer: >60 mL/min (ref >60)
GFR calc non Af Amer: >60 mL/min (ref >60)
GLUCOSE: 85 mg/dL (ref 65–99)
Glucose, Bld: 106 mg/dL — ABNORMAL HIGH (ref 65–99)
Potassium: 3 mmol/L — ABNORMAL LOW (ref 3.5–5.1)
Potassium: 3.5 mmol/L (ref 3.5–5.1)
Sodium: 135 mmol/L (ref 135–145)
Sodium: 136 mmol/L (ref 135–145)

## 2015-04-26 LAB — LACTIC ACID, PLASMA: Lactic Acid, Venous: 1.5 mmol/L (ref 0.5–2.0)

## 2015-04-26 LAB — CBC WITH DIFFERENTIAL/PLATELET
Basophils Absolute: 0 10*3/uL (ref 0.0–0.1)
Basophils Relative: 0 % (ref 0–1)
Eosinophils Absolute: 0 10*3/uL (ref 0.0–0.7)
Eosinophils Relative: 0 % (ref 0–5)
HEMATOCRIT: 38.4 % (ref 36.0–46.0)
Hemoglobin: 13 g/dL (ref 12.0–15.0)
Lymphocytes Relative: 8 % — ABNORMAL LOW (ref 12–46)
Lymphs Abs: 1.9 10*3/uL (ref 0.7–4.0)
MCH: 33.2 pg (ref 26.0–34.0)
MCHC: 33.9 g/dL (ref 30.0–36.0)
MCV: 98.2 fL (ref 78.0–100.0)
MONO ABS: 1.7 10*3/uL — AB (ref 0.1–1.0)
MONOS PCT: 7 % (ref 3–12)
NEUTROS PCT: 85 % — AB (ref 43–77)
Neutro Abs: 20.4 10*3/uL — ABNORMAL HIGH (ref 1.7–7.7)
PLATELETS: 264 10*3/uL (ref 150–400)
RBC: 3.91 MIL/uL (ref 3.87–5.11)
RDW: 13.2 % (ref 11.5–15.5)
WBC: 24 10*3/uL — AB (ref 4.0–10.5)

## 2015-04-26 LAB — TROPONIN I
Troponin I: 0.03 ng/mL (ref <0.031)
Troponin I: 0.03 ng/mL (ref <0.031)
Troponin I: <0.03 ng/mL (ref <0.031)

## 2015-04-26 LAB — PROTIME-INR
INR: 1.63 — AB (ref 0.00–1.49)
Prothrombin Time: 19.4 seconds — ABNORMAL HIGH (ref 11.6–15.2)

## 2015-04-26 LAB — MAGNESIUM
Magnesium: 1.4 mg/dL — ABNORMAL LOW (ref 1.7–2.4)
Magnesium: 1.9 mg/dL (ref 1.7–2.4)

## 2015-04-26 LAB — PHOSPHORUS: Phosphorus: 3.2 mg/dL (ref 2.5–4.6)

## 2015-04-26 LAB — APTT: APTT: 42 s — AB (ref 24–37)

## 2015-04-26 LAB — GLUCOSE, CAPILLARY: Glucose-Capillary: 103 mg/dL — ABNORMAL HIGH (ref 65–99)

## 2015-04-26 MED ORDER — FENTANYL CITRATE (PF) 100 MCG/2ML IJ SOLN
INTRAMUSCULAR | Status: AC
  Filled 2015-04-26: qty 2

## 2015-04-26 MED ORDER — SODIUM CHLORIDE 0.9 % IV SOLN
INTRAVENOUS | Status: AC | PRN
  Administered 2015-04-26: 10 mL/h via INTRAVENOUS

## 2015-04-26 MED ORDER — MAGNESIUM SULFATE 2 GM/50ML IV SOLN
2.0000 g | Freq: Once | INTRAVENOUS | Status: AC
  Administered 2015-04-26: 2 g via INTRAVENOUS
  Filled 2015-04-26: qty 50

## 2015-04-26 MED ORDER — MIDAZOLAM HCL 2 MG/2ML IJ SOLN
INTRAMUSCULAR | Status: AC | PRN
  Administered 2015-04-26 (×3): 0.5 mg via INTRAVENOUS

## 2015-04-26 MED ORDER — MIDAZOLAM HCL 2 MG/2ML IJ SOLN
INTRAMUSCULAR | Status: AC
  Filled 2015-04-26: qty 2

## 2015-04-26 MED ORDER — FENTANYL CITRATE (PF) 100 MCG/2ML IJ SOLN
INTRAMUSCULAR | Status: AC | PRN
  Administered 2015-04-26 (×3): 25 ug via INTRAVENOUS

## 2015-04-26 MED ORDER — ALPRAZOLAM 0.5 MG PO TABS
0.5000 mg | ORAL_TABLET | Freq: Once | ORAL | Status: AC
  Administered 2015-04-26: 0.5 mg via ORAL
  Filled 2015-04-26: qty 1

## 2015-04-26 NOTE — Progress Notes (Addendum)
Subjective: Overnight telemetry monitoring was notable for a four-minute sustained rapid wide QRS complex that appeared suggestive of monomorphic ventricular tachycardia.  This morning, her daughter is at bedside. She reported no symptoms surrounding her overnight arrhythmia and was actually shocked to see so many individuals enter her room. We explained that she would likely be undergoing percutaneous cholecystostomy today to drain her gallbladder and would undergo elective surgery sometime in the future. We also explained that she will be evaluated by the cardiologist as well given overnight events.   Objective: Vital signs in last 24 hours: Filed Vitals:   04/26/15 0157 04/26/15 0500 04/26/15 0632 04/26/15 0916  BP: 110/60  99/59   Pulse: 103  92   Temp: 101.6 F (38.7 C)  99.3 F (37.4 C)   TempSrc: Oral  Oral   Resp: 20  18   Height:      Weight:  230 lb 1.6 oz (104.373 kg)    SpO2: 93%  93% 94%   Weight change:   Intake/Output Summary (Last 24 hours) at 04/26/15 1040 Last data filed at 04/26/15 0951  Gross per 24 hour  Intake   1065 ml  Output      0 ml  Net   1065 ml   General: Elderly Caucasian female, resting in bed, NAD HEENT: PERRL, EOMI, no scleral icterus, oropharynx clear Cardiac: RRR, no rubs, murmurs or gallops Pulm: clear to auscultation bilaterally in the anterior lung fields, no wheezes, rales, or rhonchi Abd: Significant tenderness elicited in right upper quadrant with palpation, normoactive bowel sounds Ext: warm and well perfused, no pedal or tibial edema Neuro: responds to questions appropriately; moving all extremities freely  Lab Results: Basic Metabolic Panel:  Recent Labs Lab 04/26/15 04/26/15 0541  NA 135 136  K 3.0* 3.5  CL 104 107  CO2 20* 21*  GLUCOSE 106* 85  BUN 14 17  CREATININE 0.70 0.79  CALCIUM 9.0 8.7*  MG 1.4* 1.9  PHOS  --  3.2   Liver Function Tests:  Recent Labs Lab 04/25/15 1506  AST 25  ALT 15  ALKPHOS 73    BILITOT 0.4  PROT 7.8  ALBUMIN 3.6    Recent Labs Lab 04/25/15 1506  LIPASE 16*   CBC:  Recent Labs Lab 04/25/15 1506 04/26/15 0541  WBC 18.6* 24.0*  NEUTROABS 16.7* 20.4*  HGB 15.1* 13.0  HCT 44.1 38.4  MCV 98.0 98.2  PLT 292 264   Cardiac Enzymes:  Recent Labs Lab 04/25/15 2347 04/26/15 0541  TROPONINI 0.03 0.03   CBG:  Recent Labs Lab 04/26/15 0557  GLUCAP 103*   Urinalysis:  Recent Labs Lab 04/25/15 1656  COLORURINE AMBER*  LABSPEC 1.023  PHURINE 7.5  GLUCOSEU NEGATIVE  HGBUR NEGATIVE  BILIRUBINUR SMALL*  KETONESUR NEGATIVE  PROTEINUR 30*  UROBILINOGEN 1.0  NITRITE NEGATIVE  LEUKOCYTESUR SMALL*   Studies/Results: Dg Chest 2 View  04/25/2015   CLINICAL DATA:  Recent tripping injury with persistence shortness of Breath  EXAM: CHEST  2 VIEW  COMPARISON:  01/08/2014  FINDINGS: Cardiac shadow is stable. The lungs are clear without evidence of pneumothorax. No focal infiltrate is seen. No acute bony abnormality is noted. No pneumothorax is noted. Chronic degenerative changes of the acromioclavicular joints are seen.  IMPRESSION: No acute abnormality noted.   Electronically Signed   By: Inez Catalina M.D.   On: 04/25/2015 16:29   Ct Abdomen Pelvis W Contrast  04/25/2015   CLINICAL DATA:  Right side abdominal pain  for 3 days. Nausea, vomiting.  EXAM: CT ABDOMEN AND PELVIS WITH CONTRAST  TECHNIQUE: Multidetector CT imaging of the abdomen and pelvis was performed using the standard protocol following bolus administration of intravenous contrast.  CONTRAST:  128mL OMNIPAQUE IOHEXOL 300 MG/ML  SOLN  COMPARISON:  None.  FINDINGS: Lower chest: Lung bases are clear. No effusions. Heart is normal size.  Hepatobiliary: There is gallbladder wall thickening and pericholecystic fluid compatible with acute cholecystitis. At least 2 gallstones within the gallbladder neck, the largest 10 mm. Common bile duct is dilated, measuring 10 mm. No visible distal ductal stone. Mild  intrahepatic biliary ductal dilatation. No focal hepatic abnormality.  Pancreas: Fatty replacement without focal mass.  Spleen: No focal abnormality.  Normal size.  Adrenals/Urinary Tract: Fullness of the adrenal glands bilaterally compatible with hyperplasia. No focal renal abnormality or hydronephrosis. Urinary bladder is decompressed, grossly unremarkable.  Stomach/Bowel: Sigmoid diverticulosis. No active diverticulitis. Stomach and small bowel are unremarkable.  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 No retroperitoneal or mesenteric adenopathy.  Reproductive: Prior and hysterectomy.  No adnexal masses.  Other: No free fluid or free air.  Musculoskeletal: No focal bone lesion or acute bony abnormality.  IMPRESSION: At least 2 gallstones lodged within the gallbladder neck. Marked gallbladder wall thickening and pericholecystic fluid compatible with acute cholecystitis.  Biliary ductal dilatation also noted. No visible ductal stones, but a distal CBD stone cannot be excluded.   Electronically Signed   By: Rolm Baptise M.D.   On: 04/25/2015 18:19   Medications: I have reviewed the patient's current medications. Scheduled Meds: . ketorolac  15 mg Intravenous 4 times per day  . mometasone-formoterol  2 puff Inhalation BID  . pantoprazole (PROTONIX) IV  40 mg Intravenous QHS  . piperacillin-tazobactam (ZOSYN)  IV  3.375 g Intravenous 3 times per day  . tiotropium  18 mcg Inhalation Daily   Continuous Infusions: . sodium chloride 100 mL/hr at 04/26/15 0027   PRN Meds:.acetaminophen **OR** acetaminophen, albuterol, morphine injection, ondansetron **OR** ondansetron (ZOFRAN) IV, polyvinyl alcohol Assessment/Plan:  Sepsis secondary to acute cholecystitis: Consistent with physical exam findings and imaging findings on admission. Per surgery recommendations, she'll be undergoing percutaneous cholecystostomy by interventional  radiology given her comorbidities. This morning, leukocytosis increased to 24 from 18.6 on admission though lactate 1.5, decreased from 3.8 on admission. Blood cultures pending.  -Continue morphine 2 mg every 4 hours as needed and Toradol 50 mg every 6 hours as needed for pain -Continue Zofran 4 mg every 6 hours for nausea -Continue Protonix 40 mg IV -Continue normal saline at 100 mL/hour -Antibiotic day 2: Zosyn -Follow-up blood cultures  Monomorphic ventricular tachycardia: No symptoms of chest pain or palpitations at the time of interview this morning. Troponins negative 2 which is reassuring. Electrophysiology has been consulted and will evaluate and assess the patient. -Follow-up last troponin -Check cardiac echo per cardiology recommendations  Hypertension: Mostly normotensive overnight which is reassuring in the setting of sepsis. Medications include HCTZ 12.5 mg daily, nifedipine 90 mg daily. -Holding for now  Chronic diastolic CHF: EF 13-24% with grade 1 diastolic dysfunction as noted on TTE December 2011. Her medications include HCTZ and aspirin 81 mg. -Holding for now  COPD: PFTs noted from 06/15/13 though appear to be incomplete as there are no postbronchodilator values but was interpreted as mild restriction by Dr. Joya Gaskins; predicted FEV1/FVC 76%. Her medications include Advair 1 puff twice daily, Spiriva daily, albuterol 1-2  puffs every 6 hours as needed. -Continue Dulera one puff twice daily, Spiriva, albuterol  Fibromyalgia: At home, she takes Norco 7.04/3250 every 4 hours as needed, amitriptyline 50 mg at bedtime, 73-50 mg as needed. -Holding for now with pain control via IV medications as noted above  OSA: Per chart review, sleep study was performed on 04/02/2008 though the original report cannot be located. She was diagnosed with OSA and titrated for CPAP. -Continue CPAP as tolerated  #FEN:  -Diet: Nothing by mouth -IV fluids as noted above  #DVT prophylaxis:  SCDs  #CODE STATUS: FULL CODE   Dispo: Disposition is deferred at this time, awaiting improvement of current medical problems.    The patient does have a current PCP Bertha Stakes, MD) and does need an [facility] hospital follow-up appointment after discharge.  The patient does have transportation limitations that hinder transportation to clinic appointments.  .Services Needed at time of discharge: Y = Yes, Blank = No PT:   OT:   RN:   Equipment:   Other:     LOS: 1 day   Riccardo Dubin, MD 04/26/2015, 10:40 AM

## 2015-04-26 NOTE — Progress Notes (Signed)
Patient ID: Jennifer [name], female   DOB: 23-Nov-1941, 74 y.o.   MRN: [id number]     [facility]      [address] [place], [place] [address]    Phone: [phone] FAX: [phone]     Subjective: Labs reviewed, LFTs are normal.  WBC up to 24k.  No nausea or vomiting, but persistent pain.  Had some ekg changes on tele.    Objective:  Vital signs:  Filed Vitals:   04/25/15 2145 04/26/15 0157 04/26/15 0500 04/26/15 0632  BP: 114/65 110/60  99/59  Pulse: 100 103  92  Temp: 102.4 F (39.1 C) 101.6 F (38.7 C)  99.3 F (37.4 C)  TempSrc: Oral Oral  Oral  Resp: $Remo'18 20  18  'nPGXY$ Height:      Weight:   104.373 kg (230 lb 1.6 oz)   SpO2: 99% 93%  93%    Last BM Date: 04/25/15  Intake/Output   Yesterday:  05/20 0701 - 05/21 0700 In: 1065 [P.O.:10; I.V.:555; IV Piggyback:500] Out: 0  This shift: I/O last 3 completed shifts: In: 1065 [P.O.:10; I.V.:555; IV Piggyback:500] Out: 0     Physical Exam: General: Pt awake/alert/oriented x4 in no acute distress Abdomen: Soft.  Nondistended.  Generalized ttp.  No incarcerated hernias.    Problem List:   Principal Problem:   Acute cholecystitis Active Problems:   Hyperlipidemia   Depression   Obstructive sleep apnea   Meniere's disease   Essential hypertension   Diastolic heart failure   COPD with emphysema   Chronic low back pain   Fibromyalgia   Hypokalemia   Lactic acidosis    Results:   Labs: Results for orders placed or performed during the hospital encounter of 04/25/15 (from the past 48 hour(s))  CBC with Differential     Status: Abnormal   Collection Time: 04/25/15  3:06 PM  Result Value Ref Range   WBC 18.6 (H) 4.0 - 10.5 K/uL   RBC 4.50 3.87 - 5.11 MIL/uL   Hemoglobin 15.1 (H) 12.0 - 15.0 g/dL   HCT 44.1 36.0 - 46.0 %   MCV 98.0 78.0 - 100.0 fL   MCH 33.6 26.0 - 34.0 pg   MCHC 34.2 30.0 - 36.0 g/dL   RDW 12.7 11.5 - 15.5 %   Platelets 292 150 - 400 K/uL   Neutrophils Relative % 90 (H) 43 - 77 %   Lymphocytes Relative 3 (L) 12 - 46 %   Monocytes Relative 7 3 - 12 %   Eosinophils Relative 0 0 - 5 %   Basophils Relative 0 0 - 1 %   Neutro Abs 16.7 (H) 1.7 - 7.7 K/uL   Lymphs Abs 0.6 (L) 0.7 - 4.0 K/uL   Monocytes Absolute 1.3 (H) 0.1 - 1.0 K/uL   Eosinophils Absolute 0.0 0.0 - 0.7 K/uL   Basophils Absolute 0.0 0.0 - 0.1 K/uL   RBC Morphology POLYCHROMASIA PRESENT    WBC Morphology MILD LEFT SHIFT (1-5% METAS, OCC MYELO, OCC BANDS)     Comment: TOXIC GRANULATION  Comprehensive metabolic panel     Status: Abnormal   Collection Time: 04/25/15  3:06 PM  Result Value Ref Range   Sodium 136 135 - 145 mmol/L   Potassium 3.0 (L) 3.5 - 5.1 mmol/L   Chloride 105 101 - 111 mmol/L   CO2 18 (L) 22 - 32 mmol/L   Glucose, Bld 165 (H) 65 - 99 mg/dL   BUN  12 6 - 20 mg/dL   Creatinine, Ser 0.81 0.44 - 1.00 mg/dL   Calcium 9.9 8.9 - 10.3 mg/dL   Total Protein 7.8 6.5 - 8.1 g/dL   Albumin 3.6 3.5 - 5.0 g/dL   AST 25 15 - 41 U/L   ALT 15 14 - 54 U/L   Alkaline Phosphatase 73 38 - 126 U/L   Total Bilirubin 0.4 0.3 - 1.2 mg/dL   GFR calc non Af Amer >60 >60 mL/min   GFR calc Af Amer >60 >60 mL/min    Comment: (NOTE) The eGFR has been calculated using the CKD EPI equation. This calculation has not been validated in all clinical situations. eGFR's persistently <60 mL/min signify possible Chronic Kidney Disease.    Anion gap 13 5 - 15  Lipase, blood     Status: Abnormal   Collection Time: 04/25/15  3:06 PM  Result Value Ref Range   Lipase 16 (L) 22 - 51 U/L  I-stat troponin, ED (only if pt is 76 y.o. or older & pain is above umbilicus)  not at [facility], [facility]     Status: None   Collection Time: 04/25/15  3:12 PM  Result Value Ref Range   Troponin i, poc 0.01 0.00 - 0.08 ng/mL   Comment 3            Comment: Due to the release kinetics of cTnI, a negative result within the first hours of the onset of symptoms does not rule out myocardial infarction  with certainty. If myocardial infarction is still suspected, repeat the test at appropriate intervals.   I-Stat CG4 Lactic Acid, ED     Status: Abnormal   Collection Time: 04/25/15  3:14 PM  Result Value Ref Range   Lactic Acid, Venous 3.78 (HH) 0.5 - 2.0 mmol/L   Comment NOTIFIED PHYSICIAN   Urinalysis, Routine w reflex microscopic     Status: Abnormal   Collection Time: 04/25/15  4:56 PM  Result Value Ref Range   Color, Urine AMBER (A) YELLOW    Comment: BIOCHEMICALS MAY BE AFFECTED BY COLOR   APPearance CLEAR CLEAR   Specific Gravity, Urine 1.023 1.005 - 1.030   pH 7.5 5.0 - 8.0   Glucose, UA NEGATIVE NEGATIVE mg/dL   Hgb urine dipstick NEGATIVE NEGATIVE   Bilirubin Urine SMALL (A) NEGATIVE   Ketones, ur NEGATIVE NEGATIVE mg/dL   Protein, ur 30 (A) NEGATIVE mg/dL   Urobilinogen, UA 1.0 0.0 - 1.0 mg/dL   Nitrite NEGATIVE NEGATIVE   Leukocytes, UA SMALL (A) NEGATIVE  Urine microscopic-add on     Status: None   Collection Time: 04/25/15  4:56 PM  Result Value Ref Range   Squamous Epithelial / LPF RARE RARE   WBC, UA 3-6 <3 WBC/hpf   RBC / HPF 0-2 <3 RBC/hpf   Bacteria, UA RARE RARE  I-Stat CG4 Lactic Acid, ED     Status: Abnormal   Collection Time: 04/25/15  7:02 PM  Result Value Ref Range   Lactic Acid, Venous 2.09 (HH) 0.5 - 2.0 mmol/L   Comment NOTIFIED PHYSICIAN   Troponin I (q 6hr x 3)     Status: None   Collection Time: 04/25/15 11:47 PM  Result Value Ref Range   Troponin I 0.03 <0.031 ng/mL    Comment:        NO INDICATION OF MYOCARDIAL INJURY.   Lactic acid, plasma     Status: None   Collection Time: 04/26/15 12:00 AM  Result Value Ref  Range   Lactic Acid, Venous 1.5 0.5 - 2.0 mmol/L  Magnesium     Status: Abnormal   Collection Time: 04/26/15 12:00 AM  Result Value Ref Range   Magnesium 1.4 (L) 1.7 - 2.4 mg/dL  Basic metabolic panel     Status: Abnormal   Collection Time: 04/26/15 12:00 AM  Result Value Ref Range   Sodium 135 135 - 145 mmol/L    Potassium 3.0 (L) 3.5 - 5.1 mmol/L   Chloride 104 101 - 111 mmol/L   CO2 20 (L) 22 - 32 mmol/L   Glucose, Bld 106 (H) 65 - 99 mg/dL   BUN 14 6 - 20 mg/dL   Creatinine, Ser 0.70 0.44 - 1.00 mg/dL   Calcium 9.0 8.9 - 10.3 mg/dL   GFR calc non Af Amer >60 >60 mL/min   GFR calc Af Amer >60 >60 mL/min    Comment: (NOTE) The eGFR has been calculated using the CKD EPI equation. This calculation has not been validated in all clinical situations. eGFR's persistently <60 mL/min signify possible Chronic Kidney Disease.    Anion gap 11 5 - 15  CBC with Differential/Platelet     Status: Abnormal (Preliminary result)   Collection Time: 04/26/15  5:41 AM  Result Value Ref Range   WBC 24.0 (H) 4.0 - 10.5 K/uL   RBC 3.91 3.87 - 5.11 MIL/uL   Hemoglobin 13.0 12.0 - 15.0 g/dL   HCT 38.4 36.0 - 46.0 %   MCV 98.2 78.0 - 100.0 fL   MCH 33.2 26.0 - 34.0 pg   MCHC 33.9 30.0 - 36.0 g/dL   RDW 13.2 11.5 - 15.5 %   Platelets 264 150 - 400 K/uL   Neutrophils Relative % PENDING 43 - 77 %   Neutro Abs PENDING 1.7 - 7.7 K/uL   Band Neutrophils PENDING 0 - 10 %   Lymphocytes Relative PENDING 12 - 46 %   Lymphs Abs PENDING 0.7 - 4.0 K/uL   Monocytes Relative PENDING 3 - 12 %   Monocytes Absolute PENDING 0.1 - 1.0 K/uL   Eosinophils Relative PENDING 0 - 5 %   Eosinophils Absolute PENDING 0.0 - 0.7 K/uL   Basophils Relative PENDING 0 - 1 %   Basophils Absolute PENDING 0.0 - 0.1 K/uL   WBC Morphology PENDING    RBC Morphology PENDING    Smear Review PENDING    nRBC PENDING 0 /100 WBC   Metamyelocytes Relative PENDING %   Myelocytes PENDING %   Promyelocytes Absolute PENDING %   Blasts PENDING %  Magnesium     Status: None   Collection Time: 04/26/15  5:41 AM  Result Value Ref Range   Magnesium 1.9 1.7 - 2.4 mg/dL  Troponin I (q 6hr x 3)     Status: None   Collection Time: 04/26/15  5:41 AM  Result Value Ref Range   Troponin I 0.03 <0.031 ng/mL    Comment:        NO INDICATION OF MYOCARDIAL  INJURY.   Basic metabolic panel     Status: Abnormal   Collection Time: 04/26/15  5:41 AM  Result Value Ref Range   Sodium 136 135 - 145 mmol/L   Potassium 3.5 3.5 - 5.1 mmol/L   Chloride 107 101 - 111 mmol/L   CO2 21 (L) 22 - 32 mmol/L   Glucose, Bld 85 65 - 99 mg/dL   BUN 17 6 - 20 mg/dL   Creatinine, Ser 0.79 0.44 - 1.00  mg/dL   Calcium 8.7 (L) 8.9 - 10.3 mg/dL   GFR calc non Af Amer >60 >60 mL/min   GFR calc Af Amer >60 >60 mL/min    Comment: (NOTE) The eGFR has been calculated using the CKD EPI equation. This calculation has not been validated in all clinical situations. eGFR's persistently <60 mL/min signify possible Chronic Kidney Disease.    Anion gap 8 5 - 15  Phosphorus     Status: None   Collection Time: 04/26/15  5:41 AM  Result Value Ref Range   Phosphorus 3.2 2.5 - 4.6 mg/dL  Glucose, capillary     Status: Abnormal   Collection Time: 04/26/15  5:57 AM  Result Value Ref Range   Glucose-Capillary 103 (H) 65 - 99 mg/dL    Imaging / Studies: Dg Chest 2 View  04/25/2015   CLINICAL DATA:  Recent tripping injury with persistence shortness of Breath  EXAM: CHEST  2 VIEW  COMPARISON:  01/08/2014  FINDINGS: Cardiac shadow is stable. The lungs are clear without evidence of pneumothorax. No focal infiltrate is seen. No acute bony abnormality is noted. No pneumothorax is noted. Chronic degenerative changes of the acromioclavicular joints are seen.  IMPRESSION: No acute abnormality noted.   Electronically Signed   By: Inez Catalina M.D.   On: 04/25/2015 16:29   Ct Abdomen Pelvis W Contrast  04/25/2015   CLINICAL DATA:  Right side abdominal pain for 3 days. Nausea, vomiting.  EXAM: CT ABDOMEN AND PELVIS WITH CONTRAST  TECHNIQUE: Multidetector CT imaging of the abdomen and pelvis was performed using the standard protocol following bolus administration of intravenous contrast.  CONTRAST:  148mL OMNIPAQUE IOHEXOL 300 MG/ML  SOLN  COMPARISON:  None.  FINDINGS: Lower chest: Lung bases  are clear. No effusions. Heart is normal size.  Hepatobiliary: There is gallbladder wall thickening and pericholecystic fluid compatible with acute cholecystitis. At least 2 gallstones within the gallbladder neck, the largest 10 mm. Common bile duct is dilated, measuring 10 mm. No visible distal ductal stone. Mild intrahepatic biliary ductal dilatation. No focal hepatic abnormality.  Pancreas: Fatty replacement without focal mass.  Spleen: No focal abnormality.  Normal size.  Adrenals/Urinary Tract: Fullness of the adrenal glands bilaterally compatible with hyperplasia. No focal renal abnormality or hydronephrosis. Urinary bladder is decompressed, grossly unremarkable.  Stomach/Bowel: Sigmoid diverticulosis. No active diverticulitis. Stomach and small bowel are unremarkable.  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 No retroperitoneal or mesenteric adenopathy.  Reproductive: Prior and hysterectomy.  No adnexal masses.  Other: No free fluid or free air.  Musculoskeletal: No focal bone lesion or acute bony abnormality.  IMPRESSION: At least 2 gallstones lodged within the gallbladder neck. Marked gallbladder wall thickening and pericholecystic fluid compatible with acute cholecystitis.  Biliary ductal dilatation also noted. No visible ductal stones, but a distal CBD stone cannot be excluded.   Electronically Signed   By: Rolm Baptise M.D.   On: 04/25/2015 18:19    Medications / Allergies:  Scheduled Meds: . ketorolac  15 mg Intravenous 4 times per day  . mometasone-formoterol  2 puff Inhalation BID  . pantoprazole (PROTONIX) IV  40 mg Intravenous QHS  . piperacillin-tazobactam (ZOSYN)  IV  3.375 g Intravenous 3 times per day  . tiotropium  18 mcg Inhalation Daily   Continuous Infusions: . sodium chloride 100 mL/hr at 04/26/15 0027   PRN Meds:.acetaminophen **OR** acetaminophen, albuterol, morphine injection, ondansetron **OR**  ondansetron (ZOFRAN)  IV, polyvinyl alcohol  Antibiotics: Anti-infectives    Start     Dose/Rate Route Frequency Ordered Stop   04/26/15 0400  piperacillin-tazobactam (ZOSYN) IVPB 3.375 g     3.375 g 12.5 mL/hr over 240 Minutes Intravenous 3 times per day 04/25/15 2346     04/25/15 2000  cefTRIAXone (ROCEPHIN) 1 g in dextrose 5 % 50 mL IVPB  Status:  Discontinued     1 g 100 mL/hr over 30 Minutes Intravenous  Once 04/25/15 1951 04/25/15 1956   04/25/15 2000  piperacillin-tazobactam (ZOSYN) IVPB 3.375 g     3.375 g 100 mL/hr over 30 Minutes Intravenous  Once 04/25/15 1956 04/25/15 2052        Assessment/Plan Sepsis Acute cholecystitis-given cardiac history, ekg changes, fevers, increased WBC, voluntary guarding, will proceed with a percutaneous cholecystostomy tube with interval cholecystectomy in 6 weeks.  Keep NPO, hold blood thinners.  [id number] D#1 FEN-NPO    Erby Pian, [facility] Surgery Pager [phone])   04/26/2015 8:21 AM

## 2015-04-26 NOTE — Procedures (Signed)
Placement of percutaneous cholecystostomy without complication. 90 ml of purulent biliary fluid was aspirated.  Minimal blood loss.  See report in PACS.

## 2015-04-26 NOTE — Progress Notes (Signed)
  Echocardiogram 2D Echocardiogram has been performed.  Johny Chess 04/26/2015, 5:35 PM

## 2015-04-26 NOTE — Consult Note (Signed)
Chief Complaint: Chief Complaint  Patient presents with  . Emesis  . Abdominal Pain    Referring Physician(s): CCS  History of Present Illness: Jennifer [name] is a 74 y.o. female   Several days of abd pain; N/V Fevers off and on Work up reveals cholecystitis with 2 gallstones in neck of GB  CCS has requested percutaneous cholecystostomy drain for now High fever; wbc 24; sepsis Hx COPD Will have cholecystectomy in approx 6-8 weeks per note Dr Anselm Pancoast has reviewed imaging and approves procedure I have seen and examined pt Now scheduled for same  Past Medical History  Diagnosis Date  . COPD (chronic obstructive pulmonary disease)     O2 dependent. Followed by Dr. Joya Gaskins  . Fibromyalgia   . Hypertension   . OSA (obstructive sleep apnea)     Sleep study done April 02, 2008 showed severe obstructive sleep apnea.  . Diastolic dysfunction     Grade I diastolic dysfunction as shown on ECHO Dec 2011.  EF of 65-70%.  Marland Kitchen COPD (chronic obstructive pulmonary disease)     Followed by Dr. Joya Gaskins  . Dyslipidemia   . Osteopenia     DXA scan done on 10/23/2009 showed osteopenia of AP spine (young adult T-score -1.3), osteopenia of left femur neck (young adult T-score -1.8), and normal bone density of right femur neck (young adult T-score -0.8), with a FRAX estimate of the 10-year probability of a major osteoporotic fracture of 9.7% and probability of hip fracture 1.6%.  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 Estanislado Pandy on 11/10/2004.  MRA of the head on 03/09/2012 showed no evidence for recanalization.  . Osteoarthrosis involving multiple sites   . Chronic back pain   . Depression   . Anxiety   . Anemia   . GERD (gastroesophageal reflux disease)   . Meniere disease     Followed by ENT Dr. Silvestre Moment  . Diverticulosis of colon   . Multiple pigmented nevi   . Hypokalemia   . S/P hysterectomy 1975  . Pneumonia  11/2004  . Knee pain   . Dysphagia   . Urosepsis 11/04/2010    Klebsiella pneumoniae  . Breast pain   . Allergic rhinitis   . Fibromyalgia   . 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     . Urinary incontinence     Past Surgical History  Procedure Laterality Date  . Abdominal hysterectomy  1975  . 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 Estanislado Pandy on 11/10/2004.    Allergies: Flonase; Lisinopril; and Tetracycline  Medications: Prior to Admission medications   Medication Sig Start Date End Date Taking? Authorizing Provider  albuterol (PROAIR HFA) 108 (90 BASE) MCG/ACT inhaler INHALE 2 PUFFS BY MOUTH INTO LUNGS FOUR TIMES DAILY AS NEEDED Patient taking differently: Inhale 1-2 puffs into the lungs every 6 (six) hours as needed for wheezing or shortness of breath.  10/14/14  Yes Elsie Stain, MD  ALPRAZolam Duanne Moron) 0.5 MG tablet Take 1-1.5 tablets (0.5-0.75 mg total) by mouth 3 (three) times daily as needed for anxiety. Patient taking differently: Take 0.5-1 mg by mouth at bedtime as needed and  may repeat dose one time if needed for anxiety. Take 1 mg daily at bedtime and take 0.5 mg as needed throughout the day for anxiety 01/03/15  Yes Bertha Stakes, MD  amitriptyline (ELAVIL) 50 MG tablet Take 1 tablet (50 mg total) by mouth at bedtime. 01/03/15  Yes Bertha Stakes, MD  aspirin 81 MG EC tablet Take 81 mg by mouth daily.     Yes Historical Provider, MD  Calcium Carb-Cholecalciferol 600-800 MG-UNIT TABS Take 1 tablet by mouth 2 (two) times daily.   Yes Historical Provider, MD  carisoprodol (SOMA) 350 MG tablet Take 1 tablet (350 mg total) by mouth daily as needed for muscle  spasms. Patient taking differently: Take 350 mg by mouth daily.  01/03/15  Yes Bertha Stakes, MD  Cholecalciferol (VITAMIN D3) 1000 UNITS CAPS Take 1 capsule (1,000 Units total) by mouth daily. 01/23/15  Yes Bertha Stakes, MD  CINNAMON PO Take by mouth 2 (two) times daily.   Yes Historical Provider, MD  dextromethorphan-guaiFENesin (MUCINEX DM) 30-600 MG per 12 hr tablet Take 1 tablet by mouth 2 (two) times daily.   Yes Historical Provider, MD  docusate sodium (COLACE) 100 MG capsule Take 100 mg by mouth daily as needed for mild constipation or moderate constipation.   Yes Historical Provider, MD  Fluticasone-Salmeterol (ADVAIR DISKUS) 250-50 MCG/DOSE AEPB Inhale 1 puff into the lungs 2 (two) times daily. 10/14/14  Yes Elsie Stain, MD  hydrochlorothiazide (HYDRODIURIL) 25 MG tablet Take 0.5 tablets (12.5 mg total) by mouth daily. 03/07/14  Yes Bertha Stakes, MD  HYDROcodone-acetaminophen (NORCO) 7.5-325 MG per tablet Take 1 tablet by mouth 4 (four) times daily as needed for moderate pain or severe pain. 04/07/15  Yes Aldine Contes, MD  hydroxypropyl methylcellulose (ISOPTO TEARS) 2.5 % ophthalmic solution Place 2 drops into both eyes 3 (three) times daily as needed. Dry eye   Yes Historical Provider, MD  ibuprofen (ADVIL,MOTRIN) 200 MG tablet Take 200 mg by mouth 4 (four) times daily as needed for moderate pain. For pain    Yes Historical Provider, MD  meclizine (ANTIVERT) 12.5 MG tablet Take 1 tablet (12.5 mg total) by mouth 2 (two) times daily as needed for dizziness. 11/20/14  Yes Bertha Stakes, MD  Multiple Vitamin (MULTIVITAMIN) tablet Take 1 tablet by mouth daily.   Yes Historical Provider, MD  NIFEdipine (ADALAT CC) 90 MG 24 hr tablet Take 1 tablet (90 mg total) by mouth daily. 09/06/14  Yes Bertha Stakes, MD  omeprazole (PRILOSEC) 20 MG capsule Take 1 capsule (20 mg total) by mouth 2 (two) times daily. 02/05/15  Yes Bertha Stakes, MD  potassium chloride (K-DUR) 10 MEQ tablet Take 2 tablets (20 mEq  total) by mouth 2 (two) times daily. 06/11/14  Yes Bertha Stakes, MD  ranitidine (ZANTAC) 150 MG tablet Take 1 tablet (150 mg total) by mouth at bedtime. 03/05/15  Yes Bertha Stakes, MD  Red Yeast Rice Extract (RED YEAST RICE PO) Take by mouth 2 (two) times daily.   Yes Historical Provider, MD  sertraline (ZOLOFT) 100 MG tablet Take 1 tablet (100 mg total) by mouth 2 (two) times daily. 09/06/14  Yes Bertha Stakes, MD  tiotropium (SPIRIVA HANDIHALER) 18 MCG inhalation capsule Place 1 capsule (18 mcg total) into inhaler and inhale daily. 10/14/14  Yes Elsie Stain, MD  topiramate (TOPAMAX) 50 MG tablet Take 1 tablet (50 mg total) by mouth 2 (two) times daily. 04/02/15  Yes Bertha Stakes, MD  triamcinolone cream (KENALOG) 0.1 % APPLY TOPICALLY  TO THE AFFECTED AREA OF HAND TWICE DAILY AS NEEDED Patient taking differently: APPLY TOPICALLY TO THE AFFECTED AREA OF HAND TWICE DAILY AS NEEDED FOR HAND 03/07/14  Yes Bertha Stakes, MD     Family History  Problem Relation Age of Onset  . Ulcers Mother   . Ovarian cancer Sister 27  . Cervical cancer Daughter   . Cervical cancer Daughter   . Lung cancer Neg Hx   . Colon cancer Neg Hx   . Heart attack Father   . Cerebral aneurysm Sister 47    History   Social History  . Marital Status: Widowed    Spouse Name: N/A  . Number of Children: N/A  . Years of Education: N/A   Social History Main Topics  . Smoking status: Former Smoker -- 0.50 packs/day for 30 years    Types: Cigarettes    Quit date: 12/07/2008  . Smokeless tobacco: Former Systems developer    Types: [place] date: 12/06/1978  . Alcohol Use: No  . Drug Use: No  . Sexual Activity: No   Other Topics Concern  . None   Social History Narrative   Financial assistance approved for 100% discount after Medicare pays for [facility] only, not eligible for [facility] card. Per Bonna Gains    Review of Systems: A 12 point ROS discussed and pertinent positives are indicated in the HPI above.  All other systems are  negative.  Review of Systems  Constitutional: Positive for fever, activity change, appetite change and fatigue.  Respiratory: Positive for shortness of breath.   Cardiovascular: Negative for chest pain.  Gastrointestinal: Positive for nausea and abdominal pain.  Neurological: Positive for weakness.  Psychiatric/Behavioral: Negative for behavioral problems and confusion.    Vital Signs: BP 99/59 mmHg  Pulse 92  Temp(Src) 99.3 F (37.4 C) (Oral)  Resp 18  Ht 5\' 3"  (1.6 m)  Wt 104.373 kg (230 lb 1.6 oz)  BMI 40.77 kg/m2  SpO2 94%  Physical Exam  Constitutional: She is oriented to person, place, and time. She appears well-nourished.  Cardiovascular: Normal rate, regular rhythm and normal heart sounds.   No murmur heard. Pulmonary/Chest: Effort normal. She has wheezes.  Abdominal: Soft. Bowel sounds are normal. There is tenderness.  Musculoskeletal: Normal range of motion.  Neurological: She is alert and oriented to person, place, and time.  Skin: Skin is warm and dry.  Psychiatric: She has a normal mood and affect. Her behavior is normal. Judgment and thought content normal.  Nursing note and vitals reviewed.   Mallampati Score:  MD Evaluation Airway: WNL Heart: WNL Abdomen: WNL Chest/ Lungs: WNL ASA  Classification: 3 Mallampati/Airway Score: Two  Imaging: Dg Chest 2 View  04/25/2015   CLINICAL DATA:  Recent tripping injury with persistence shortness of Breath  EXAM: CHEST  2 VIEW  COMPARISON:  01/08/2014  FINDINGS: Cardiac shadow is stable. The lungs are clear without evidence of pneumothorax. No focal infiltrate is seen. No acute bony abnormality is noted. No pneumothorax is noted. Chronic degenerative changes of the acromioclavicular joints are seen.  IMPRESSION: No acute abnormality noted.   Electronically Signed   By: Inez Catalina M.D.   On: 04/25/2015 16:29   Ct Abdomen Pelvis W Contrast  04/25/2015   CLINICAL DATA:  Right side abdominal pain for 3 days. Nausea,  vomiting.  EXAM: CT ABDOMEN AND PELVIS WITH CONTRAST  TECHNIQUE: Multidetector CT imaging of the abdomen and pelvis was performed using the standard protocol following bolus  administration of intravenous contrast.  CONTRAST:  133mL OMNIPAQUE IOHEXOL 300 MG/ML  SOLN  COMPARISON:  None.  FINDINGS: Lower chest: Lung bases are clear. No effusions. Heart is normal size.  Hepatobiliary: There is gallbladder wall thickening and pericholecystic fluid compatible with acute cholecystitis. At least 2 gallstones within the gallbladder neck, the largest 10 mm. Common bile duct is dilated, measuring 10 mm. No visible distal ductal stone. Mild intrahepatic biliary ductal dilatation. No focal hepatic abnormality.  Pancreas: Fatty replacement without focal mass.  Spleen: No focal abnormality.  Normal size.  Adrenals/Urinary Tract: Fullness of the adrenal glands bilaterally compatible with hyperplasia. No focal renal abnormality or hydronephrosis. Urinary bladder is decompressed, grossly unremarkable.  Stomach/Bowel: Sigmoid diverticulosis. No active diverticulitis. Stomach and small bowel are unremarkable.  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 No retroperitoneal or mesenteric adenopathy.  Reproductive: Prior and hysterectomy.  No adnexal masses.  Other: No free fluid or free air.  Musculoskeletal: No focal bone lesion or acute bony abnormality.  IMPRESSION: At least 2 gallstones lodged within the gallbladder neck. Marked gallbladder wall thickening and pericholecystic fluid compatible with acute cholecystitis.  Biliary ductal dilatation also noted. No visible ductal stones, but a distal CBD stone cannot be excluded.   Electronically Signed   By: Rolm Baptise M.D.   On: 04/25/2015 18:19    Labs:  CBC:  Recent Labs  01/22/15 0954 04/25/15 1506 04/26/15 0541  WBC 8.6 18.6* 24.0*  HGB 13.2 15.1* 13.0  HCT 39.8 44.1 38.4  PLT 212 292 264     COAGS: No results for input(s): INR, APTT in the last 8760 hours.  BMP:  Recent Labs  12/12/14 1228 04/25/15 1506 04/26/15 04/26/15 0541  NA 139 136 135 136  K 4.2 3.0* 3.0* 3.5  CL 105 105 104 107  CO2 26 18* 20* 21*  GLUCOSE 107* 165* 106* 85  BUN 17 12 14 17   CALCIUM 9.1 9.9 9.0 8.7*  CREATININE 0.61 0.81 0.70 0.79  GFRNONAA 89 >60 >60 >60  GFRAA >89 >60 >60 >60    LIVER FUNCTION TESTS:  Recent Labs  12/12/14 1228 04/25/15 1506  BILITOT 0.2 0.4  AST 16 25  ALT 15 15  ALKPHOS 102 73  PROT 6.3 7.8  ALBUMIN 3.9 3.6    TUMOR MARKERS: No results for input(s): AFPTM, CEA, CA199, CHROMGRNA in the last 8760 hours.  Assessment and Plan:  Acute cholecystitis Gallstones Sepsis prob cholecystectomy with CCS 6-8 weeks Scheduled for percutaneous cholecystostomy drain placement in IR Risks and Benefits discussed with the patient including, but not limited to bleeding, infection, gallbladder perforation, bile leak, sepsis or even death. All of the patient's questions were answered, patient is agreeable to proceed. Consent signed and in chart.   Thank you for this interesting consult.  I greatly enjoyed meeting MORRISON MASSER and look forward to participating in their care.  Signed: Khamryn Calderone A 04/26/2015, 9:51 AM   I spent a total of 40 Minutes    in face to face in clinical consultation, greater than 50% of which was counseling/coordinating care for perc chole drain

## 2015-04-26 NOTE — Consult Note (Signed)
CARDIOLOGY/ ELECTROPHYSIOLOGY CONSULT NOTE    Primary Care Physician: Axel Filler, MD Referring Physician:  Medicine teaching service  Admit Date: 04/25/2015  Reason for consultation:  Wide complex tachycardia  Jennifer [name] is a 74 y.o. female with a h/o morbid obesity, OSA, HTN, and COPD.  She denies prior cardiac history.  She presents with acute cholecystitis.  Reports significant abdominal pain.  She has chronic SOB which she attributes to COPD.  This is not worse recently.  She uses CPAP but did not have it available last night. Early this am, 1:40:47, she had an episode of wide complex tachycardia.  She was asymptomatic with this.  No other arrhythmias have been detected.  She denies any recent palpitations, CP, or syncope.  Electrolytes were abnormal and have been repleted.   Today, she denies symptoms of palpitations, chest pain,  orthopnea, PND, lower extremity edema, dizziness, presyncope, syncope, or neurologic sequela. The patient is tolerating medications without difficulties and is otherwise without complaint today.   Past Medical History  Diagnosis Date  . COPD (chronic obstructive pulmonary disease)     O2 dependent. Followed by Dr. Joya Gaskins  . Fibromyalgia   . Hypertension   . OSA (obstructive sleep apnea)     Sleep study done April 02, 2008 showed severe obstructive sleep apnea.  . Diastolic dysfunction     Grade I diastolic dysfunction as shown on ECHO Dec 2011.  EF of 65-70%.  Marland Kitchen COPD (chronic obstructive pulmonary disease)     Followed by Dr. Joya Gaskins  . Dyslipidemia   . Osteopenia     DXA scan done on 10/23/2009 showed osteopenia of AP spine (young adult T-score -1.3), osteopenia of left femur neck (young adult T-score -1.8), and normal bone density of right femur neck (young adult T-score -0.8), with a FRAX estimate of the 10-year probability of a major osteoporotic fracture of 9.7% and probability of hip fracture 1.6%.  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 Estanislado Pandy on 11/10/2004.  MRA of the head on 03/09/2012 showed no evidence for recanalization.  . Osteoarthrosis involving multiple sites   . Chronic back pain   . Depression   . Anxiety   . Anemia   . GERD (gastroesophageal reflux disease)   . Meniere disease     Followed by ENT Dr. Silvestre Moment  . Diverticulosis of colon   . Multiple pigmented nevi   . Hypokalemia   . S/P hysterectomy 1975  . Pneumonia 11/2004  . Knee pain   . Dysphagia   . Urosepsis 11/04/2010    Klebsiella pneumoniae  . Breast pain   . Allergic rhinitis   . Fibromyalgia   . 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     . Urinary incontinence    Past Surgical History  Procedure Laterality Date  . Abdominal hysterectomy  1975  . 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 Estanislado Pandy on 11/10/2004.    Marland Kitchen ketorolac  15 mg Intravenous 4 times per day  . mometasone-formoterol  2 puff Inhalation BID  . pantoprazole (PROTONIX) IV  40 mg Intravenous QHS  . piperacillin-tazobactam (ZOSYN)  IV  3.375 g Intravenous  3 times per day  . tiotropium  18 mcg Inhalation Daily      Allergies  Allergen Reactions  . Flonase [Fluticasone] Cough  . Lisinopril Other (See Comments)    Cough  . Tetracycline     REACTION: stomach upset    History   Social History  . Marital Status: Widowed    Spouse Name: N/A  . Number of Children: N/A  . Years of Education: N/A   Occupational History  . Not on file.   Social History Main Topics  . Smoking status: Former Smoker -- 0.50 packs/day for 30 years    Types: Cigarettes    Quit date: 12/07/2008  .  Smokeless tobacco: Former Systems developer    Types: [place] date: 12/06/1978  . Alcohol Use: No  . Drug Use: No  . Sexual Activity: No   Other Topics Concern  . Not on file   Social History Narrative   Financial assistance approved for 100% discount after Medicare pays for [facility] only, not eligible for [facility] card. Per Bonna Gains    Family History  Problem Relation Age of Onset  . Ulcers Mother   . Ovarian cancer Sister 43  . Cervical cancer Daughter   . Cervical cancer Daughter   . Lung cancer Neg Hx   . Colon cancer Neg Hx   . Heart attack Father   . Cerebral aneurysm Sister 54    ROS- All systems are reviewed and negative except as per the HPI above  Physical Exam: Telemetry: reveals sinus rhythm with single episode of WCT at 1:40:47 (see A/P below) Filed Vitals:   04/26/15 0157 04/26/15 0500 04/26/15 0632 04/26/15 0916  BP: 110/60  99/59   Pulse: 103  92   Temp: 101.6 F (38.7 C)  99.3 F (37.4 C)   TempSrc: Oral  Oral   Resp: 20  18   Height:      Weight:  230 lb 1.6 oz (104.373 kg)    SpO2: 93%  93% 94%    GEN- The patient is morbidly obese, appearing, alert and oriented x 3 today.   Head- normocephalic, atraumatic Eyes-  Sclera clear, conjunctiva pink Ears- hearing intact Oropharynx- clear Neck- supple,  Lungs- Clear to ausculation bilaterally, normal work of breathing Heart- Regular rate and rhythm, no murmurs, rubs or gallops, PMI not laterally displaced GI- distended and very tender, decreased BS, + guarding Extremities- no clubbing, cyanosis, or edema MS- no significant deformity or atrophy Skin- no rash or lesion Psych- euthymic mood, full affect Neuro- strength and sensation are intact  EKG- reveals sinus rhythm  Labs:   Lab Results  Component Value Date   WBC 24.0* 04/26/2015   HGB 13.0 04/26/2015   HCT 38.4 04/26/2015   MCV 98.2 04/26/2015   PLT 264 04/26/2015    Recent Labs Lab 04/25/15 1506  04/26/15 0541  NA 136  < > 136  K 3.0*  <  > 3.5  CL 105  < > 107  CO2 18*  < > 21*  BUN 12  < > 17  CREATININE 0.81  < > 0.79  CALCIUM 9.9  < > 8.7*  PROT 7.8  --   --   BILITOT 0.4  --   --   ALKPHOS 73  --   --   ALT 15  --   --   AST 25  --   --   GLUCOSE 165*  < > 85  < > =  values in this interval not displayed. Lab Results  Component Value Date   CKTOTAL 172 11/05/2010   CKMB 3.2 11/05/2010   TROPONINI <0.03 04/26/2015    Lab Results  Component Value Date   CHOL 228* 12/12/2014   CHOL 215* 08/15/2013   CHOL 256* 02/07/2013   Lab Results  Component Value Date   HDL 61 12/12/2014   HDL 65 08/15/2013   HDL 70 02/07/2013   Lab Results  Component Value Date   LDLCALC 131* 12/12/2014   LDLCALC 110* 08/15/2013   LDLCALC 153* 02/07/2013   Lab Results  Component Value Date   TRIG 182* 12/12/2014   TRIG 200* 08/15/2013   TRIG 164* 02/07/2013   Lab Results  Component Value Date   CHOLHDL 3.7 12/12/2014   CHOLHDL 3.3 08/15/2013   CHOLHDL 3.7 02/07/2013   No results found for: LDLDIRECT    Echo:  pending  ASSESSMENT AND PLAN:   1. Wide complex tachycardia I have reviewed the strips of her tachycardia at length.  This reveals a wide complex tachycardia with cycle length of 310 msec.  The tachycardia initially began as narrow complex afib for several seconds which appears to have regularized into atrial flutter and then rapidly conducted with 1:1 conduction.  The first two RR intervals prior to wide complex are NARROW complex with same cycle length of 310 msec before widening of QRS.  The arrhythmia is therefore atrial flutter with abarrancy rather than VT. She does NOT have VT on telemetry. Her afib/atrial flutter is short and occurs in the setting of acute medical illness, low K/Mg, acidosis, and also while not wearing her CPAP.  Though chads2vasc score is at least 4, I would not favor anticoagulation at this time.  IF she has sustained atrial arrhythmias or demonstrates them in the future outside of her  current medical illness, then we may have to reconsider. Echo is pending.  2. OSA Will order CPAP  3. Morbid obesity Weight loss is advised long term  4. Acute cholecystitis Plans for management noted.  She is probably moderate risk for any procedures given her age and comorbidities.  I do not feel that further CV risk stratification would be helpful in her current situation.  If echo is low risk, would proceed with medically necessary interventions as required.    Cardiology to see as needed Please call with questions.    Thompson Grayer, MD 04/26/2015  1:00 PM

## 2015-04-26 NOTE — Progress Notes (Signed)
4 minutes sustained rapid wide QRS complex rhythm. Pt asymptomatic.  Dr notified and Magnesium ordered.

## 2015-04-26 NOTE — Progress Notes (Signed)
Pt arrived back to the unit, No pain at this time family at the bedside.

## 2015-04-26 NOTE — Discharge Summary (Signed)
Name: Jennifer [name] MRN: [id number] DOB: 1941-04-10 74 y.o. PCP: Bertha Stakes, MD  Date of Admission: 04/25/2015  3:19 PM Date of Discharge: 05/02/2015 Attending Physician: Sid Falcon, MD  Discharge Diagnosis: Sepsis secondary to acute cholecystitis Atrial flutter with aberrancy Chronic diastolic heart failure Hypertension COPD Fibromyalgia OSA  Discharge Medications:   Medication List    STOP taking these medications        Calcium Carb-Cholecalciferol 600-800 MG-UNIT Tabs      TAKE these medications        albuterol 108 (90 BASE) MCG/ACT inhaler  Commonly known as:  PROAIR HFA  INHALE 2 PUFFS BY MOUTH INTO LUNGS FOUR TIMES DAILY AS NEEDED     ALPRAZolam 0.5 MG tablet  Commonly known as:  XANAX  Take 1 tablet (0.5 mg total) by mouth 2 (two) times daily as needed for anxiety.     amitriptyline 50 MG tablet  Commonly known as:  ELAVIL  Take 1 tablet (50 mg total) by mouth at bedtime.     amoxicillin-clavulanate 875-125 MG per tablet  Commonly known as:  AUGMENTIN  Take 1 tablet by mouth every 12 (twelve) hours.     aspirin 81 MG EC tablet  Take 81 mg by mouth daily.     carisoprodol 350 MG tablet  Commonly known as:  SOMA  Take 1 tablet (350 mg total) by mouth daily as needed for muscle spasms.     CINNAMON PO  Take by mouth 2 (two) times daily.     dextromethorphan-guaiFENesin 30-600 MG per 12 hr tablet  Commonly known as:  MUCINEX DM  Take 1 tablet by mouth 2 (two) times daily.     docusate sodium 100 MG capsule  Commonly known as:  COLACE  Take 100 mg by mouth daily as needed for mild constipation or moderate constipation.     Fluticasone-Salmeterol 250-50 MCG/DOSE Aepb  Commonly known as:  ADVAIR DISKUS  Inhale 1 puff into the lungs 2 (two) times daily.     hydrochlorothiazide 25 MG tablet  Commonly known as:  HYDRODIURIL  Take 0.5 tablets (12.5 mg total) by mouth daily.     HYDROcodone-acetaminophen 7.5-325 MG per tablet  Commonly known  as:  NORCO  Take 1 tablet by mouth 4 (four) times daily as needed for moderate pain or severe pain.     hydroxypropyl methylcellulose / hypromellose 2.5 % ophthalmic solution  Commonly known as:  ISOPTO TEARS / GONIOVISC  Place 2 drops into both eyes 3 (three) times daily as needed. Dry eye     ibuprofen 200 MG tablet  Commonly known as:  ADVIL,MOTRIN  - Take 200 mg by mouth 4 (four) times daily as needed for moderate pain. For pain  -      meclizine 12.5 MG tablet  Commonly known as:  ANTIVERT  Take 1 tablet (12.5 mg total) by mouth 2 (two) times daily as needed for dizziness.     multivitamin tablet  Take 1 tablet by mouth daily.     NIFEdipine 90 MG 24 hr tablet  Commonly known as:  ADALAT CC  Take 1 tablet (90 mg total) by mouth daily.     omeprazole 20 MG capsule  Commonly known as:  PRILOSEC  Take 1 capsule (20 mg total) by mouth 2 (two) times daily.     potassium chloride 10 MEQ tablet  Commonly known as:  K-DUR  Take 2 tablets (20 mEq total) by mouth 2 (two) times daily.  ranitidine 150 MG tablet  Commonly known as:  ZANTAC  Take 1 tablet (150 mg total) by mouth at bedtime.     RED YEAST RICE PO  Take by mouth 2 (two) times daily.     sertraline 100 MG tablet  Commonly known as:  ZOLOFT  Take 1 tablet (100 mg total) by mouth 2 (two) times daily.     tiotropium 18 MCG inhalation capsule  Commonly known as:  SPIRIVA HANDIHALER  Place 1 capsule (18 mcg total) into inhaler and inhale daily.     topiramate 50 MG tablet  Commonly known as:  TOPAMAX  Take 1 tablet (50 mg total) by mouth 2 (two) times daily.     triamcinolone cream 0.1 %  Commonly known as:  KENALOG  APPLY TOPICALLY TO THE AFFECTED AREA OF HAND TWICE DAILY AS NEEDED     Vitamin D3 1000 UNITS Caps  Take 1 capsule (1,000 Units total) by mouth daily.        Disposition and follow-up:   Jennifer [name] was discharged from [facility] in Stable condition.  At the hospital  follow up visit please address:  Wound care   Gastric symptoms  Completion of antibiotic therapy for acute cholecystitis  Hypokalemia: Recheck BMET to assess if her potassium supplement needs to be adjusted  Follow-up Appointments: Follow-up Information    Follow up with Judeth Horn, MD. Go on 06/03/2015.   Specialty:  General Surgery   Why:  @9am  :: Come no later than 8:30am to begin paperwork.    Contact information:   [place] [address] [phone]       Follow up with Luanne Bras, MD On 05/12/2015.   Specialty:  Internal Medicine   Why:  1015AM   Contact information:   [address] [place] [address] [phone]       Discharge Instructions: Discharge Instructions    Change dressing (specify)    Complete by:  As directed   Dressing change: 1 times per day and cover with gauze pad. Please keep incision site covered, clean, dry, intact. Flush drain every other day.     Diet - low sodium heart healthy    Complete by:  As directed      Increase activity slowly    Complete by:  As directed            Consultations:    Procedures Performed:  Dg Chest 2 View  04/25/2015   CLINICAL DATA:  Recent tripping injury with persistence shortness of Breath  EXAM: CHEST  2 VIEW  COMPARISON:  01/08/2014  FINDINGS: Cardiac shadow is stable. The lungs are clear without evidence of pneumothorax. No focal infiltrate is seen. No acute bony abnormality is noted. No pneumothorax is noted. Chronic degenerative changes of the acromioclavicular joints are seen.  IMPRESSION: No acute abnormality noted.   Electronically Signed   By: Inez Catalina M.D.   On: 04/25/2015 16:29   Ct Abdomen Pelvis W Contrast  04/25/2015   CLINICAL DATA:  Right side abdominal pain for 3 days. Nausea, vomiting.  EXAM: CT ABDOMEN AND PELVIS WITH CONTRAST  TECHNIQUE: Multidetector CT imaging of the abdomen and pelvis was performed using the standard protocol following bolus administration  of intravenous contrast.  CONTRAST:  141mL OMNIPAQUE IOHEXOL 300 MG/ML  SOLN  COMPARISON:  None.  FINDINGS: Lower chest: Lung bases are clear. No effusions. Heart is normal size.  Hepatobiliary: There is gallbladder  wall thickening and pericholecystic fluid compatible with acute cholecystitis. At least 2 gallstones within the gallbladder neck, the largest 10 mm. Common bile duct is dilated, measuring 10 mm. No visible distal ductal stone. Mild intrahepatic biliary ductal dilatation. No focal hepatic abnormality.  Pancreas: Fatty replacement without focal mass.  Spleen: No focal abnormality.  Normal size.  Adrenals/Urinary Tract: Fullness of the adrenal glands bilaterally compatible with hyperplasia. No focal renal abnormality or hydronephrosis. Urinary bladder is decompressed, grossly unremarkable.  Stomach/Bowel: Sigmoid diverticulosis. No active diverticulitis. Stomach and small bowel are unremarkable.  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 No retroperitoneal or mesenteric adenopathy.  Reproductive: Prior and hysterectomy.  No adnexal masses.  Other: No free fluid or free air.  Musculoskeletal: No focal bone lesion or acute bony abnormality.  IMPRESSION: At least 2 gallstones lodged within the gallbladder neck. Marked gallbladder wall thickening and pericholecystic fluid compatible with acute cholecystitis.  Biliary ductal dilatation also noted. No visible ductal stones, but a distal CBD stone cannot be excluded.   Electronically Signed   By: Rolm Baptise M.D.   On: 04/25/2015 18:19   Ir Perc Cholecystostomy  04/26/2015   CLINICAL DATA:  74 year old with acute cholecystitis and not a good surgical candidate at this time.  EXAM: ULTRASOUND AND FLUOROSCOPIC GUIDED CHOLECYSTOSTOMY TUBE PLACEMENT  Physician: Stephan Minister. Henn, MD  FLUOROSCOPY TIME:  1 minutes and 30 seconds.  46.31 mGy  MEDICATIONS: 1.5 mg Versed, 75 mcg fentanyl. A radiology  nurse monitored the patient for moderate sedation.  ANESTHESIA/SEDATION: Moderate sedation time: 15 minutes  PROCEDURE: The procedure was explained to the patient. The risks and benefits of the procedure were discussed and the patient's questions were addressed. Informed consent was obtained from the patient. Patient was placed supine on the interventional table. The right upper abdomen was evaluated with ultrasound. The right upper abdomen was prepped and draped in sterile fashion. Maximal barrier sterile technique was utilized including caps, mask, sterile gowns, sterile gloves, sterile drape, hand hygiene and skin antiseptic. 1% lidocaine was used for local anesthetic. A 21 gauge needle was directed into the gallbladder from a transhepatic approach. A 0.018 wire was advanced of the gallbladder with fluoroscopy. An Accustick dilator set was placed. A small amount of brown purulent fluid was aspirated. Tract was dilated over a J-wire. A 10.2 [place] multipurpose drain was advanced over the wire and reconstituted in the gallbladder. Approximately 90 mL of purulent biliary fluid was aspirated. Catheter was sutured to the skin and attached to gravity bag. Fluid was sent for culture. Fluoroscopic and ultrasound images were taken and saved for documentation.  FINDINGS: Gallbladder was moderately distended with a small amount of wall thickening and/or pericholecystic fluid. Tube was successful placed in the gallbladder and the gallbladder was decompressed at the end of the procedure.  Estimated blood loss: Minimal  COMPLICATIONS: None  IMPRESSION: Successful percutaneous cholecystostomy tube placement.   Electronically Signed   By: Markus Daft M.D.   On: 04/26/2015 17:38    2D Echo:   - Left ventricle: The cavity size was normal. Wall thickness was normal. Systolic function was normal. The estimated ejection fraction was in the range of 55% to 60%. Wall motion was normal; there were no regional wall motion  abnormalities. Doppler parameters are consistent with abnormal left ventricular relaxation (grade 1 diastolic dysfunction). - Ascending aorta: The ascending aorta was mildly dilated. - Left atrium: The atrium was mildly dilated.  Impressions:  - Normal LV function; grade 1 diastolic dysfunction; mild LAE; trace TR; mildly dilated ascending aorta (4.2 cm).  Admission HPI: Ms Large is a 74 year old woman with HTN, chronic diastolic HF, COPD, fibromyalgia here for abdominal pain. She was in her usual state of health until about three days ago when she began to feel general malaise. For the past two days she has had abdominal pain with associated nausea and non-bloody but questionably bilious emesis. The pain is right sided, non-radiating, aching pain. She has also had a few days of diarrhea but none today. She has had decreased po intake and says today all she ate was a piece of bread and a few spoonfuls of oatmeal. Today she had subjective fever and checked it at home and it was reportedly 103.18F so she came to ED.  In the ED, she had CT abd/pelvis that showed at least two gallstones lodged in the gallbladder neck with marked gallbladder wall thickening and pericholecystic fluid compatible with acute cholecystitis. She was seen by general surgery in the ED who recommended antibiotics and urgent cholecystectomy. She has received zosyn 3.375 mg iv once, 1 L NS, zofran 4 mg iv once, morphine 4 mg iv x 2, and reglan 5 mg iv once.  Hospital Course by problem list:  Sepsis secondary to acute cholecystitis: She underwent percutaneous cholecystostomy placement on 5/21 given her other coexisting medical comorbidities which would make her a poor surgical candidate. Throughout her hospital course, her leukocytosis normalized, and she remained afebrile post procedurally. She did develop intermittent hypokalemia for which she was supplemented, and potassium should be rechecked at the time of discharge to  determine if her home supplement dosage needs to be increased. She was initially received ceftriaxone 1 was continued on Zosyn for 5 days before being transitioned to Augmentin 875/125 milligrams to complete a total 7 day course of therapy. Culture of biliary fluid was notable for polymicrobial growth. She initially required scheduled Toradol and morphine as needed for her pain control improved, and she was transitioned back to her home Norco. Surgery recommended outpatient follow-up in 4 weeks to schedule her follow-up cholecystectomy. Though she was to be discharged to home initially, her daughter felt she would be unable to provide for her mother at home, and physical therapy recommended SNF placement.  Atrial flutter with aberrancy: On the night of admission, she developed a wide complex tachycardia persisting for 4 minutes though remained asymptomatic. Cardiology was consulted, and upon review of telemetry, it was determined she had there appeared to be initially narrow complex atrial fibrillation which then regularized into atrial flutter with aberrancy. Her troponins were negative 3, and EKG findings were stable. Though her CHADSVASC score was at least 4, anticoagulation was deferred in the setting of her acute illness and pending other indications. She had a repeat echo which had findings stable from most recent study, December 2011 [see above].   Hypertension: Initially held but resumed by the day of discharge.  Chronic diastolic CHF: As noted the findings. She remained stable on her home hypertension medications.  COPD: she remained stable on her home medications.  Fibromyalgia: She is restarted on her Zoloft 100 mg twice daily, Topamax 100 mg twice daily, Xanax 0.5 mg at bedtime along with her pain medication as noted above. Her home Manuela Neptune was not continued throughout the hospital course as her pain was adequately controlled on the other medications.  OSA: CPAP was ordered though she did not  initially where it  she could not tolerate the facemask, and her daughter brought in her home mask.  Discharge Vitals:   BP 123/68 mmHg  Pulse 69  Temp(Src) 98 F (36.7 C) (Axillary)  Resp 18  Ht 5\' 3"  (1.6 m)  Wt 220 lb 14.4 oz (100.2 kg)  BMI 39.14 kg/m2  SpO2 97%  Discharge Labs:  Results for orders placed or performed during the hospital encounter of 04/25/15 (from the past 24 hour(s))  Basic metabolic panel     Status: Abnormal   Collection Time: 05/02/15  4:45 AM  Result Value Ref Range   Sodium 134 (L) 135 - 145 mmol/L   Potassium 3.7 3.5 - 5.1 mmol/L   Chloride 98 (L) 101 - 111 mmol/L   CO2 26 22 - 32 mmol/L   Glucose, Bld 109 (H) 65 - 99 mg/dL   BUN 8 6 - 20 mg/dL   Creatinine, Ser 0.75 0.44 - 1.00 mg/dL   Calcium 9.2 8.9 - 10.3 mg/dL   GFR calc non Af Amer >60 >60 mL/min   GFR calc Af Amer >60 >60 mL/min   Anion gap 10 5 - 15  Glucose, capillary     Status: Abnormal   Collection Time: 05/02/15  6:06 AM  Result Value Ref Range   Glucose-Capillary 111 (H) 65 - 99 mg/dL    Signed: Riccardo Dubin, MD 05/02/2015, 12:40 PM    Services Ordered on Discharge: SNF Equipment Ordered on Discharge: none

## 2015-04-26 NOTE — Sedation Documentation (Signed)
Patient denies pain and is resting comfortably.  

## 2015-04-27 LAB — CBC
HEMATOCRIT: 33.9 % — AB (ref 36.0–46.0)
HEMOGLOBIN: 11.4 g/dL — AB (ref 12.0–15.0)
MCH: 32.9 pg (ref 26.0–34.0)
MCHC: 33.6 g/dL (ref 30.0–36.0)
MCV: 97.7 fL (ref 78.0–100.0)
PLATELETS: 237 10*3/uL (ref 150–400)
RBC: 3.47 MIL/uL — AB (ref 3.87–5.11)
RDW: 13.5 % (ref 11.5–15.5)
WBC: 16.7 10*3/uL — ABNORMAL HIGH (ref 4.0–10.5)

## 2015-04-27 LAB — COMPREHENSIVE METABOLIC PANEL
ALK PHOS: 79 U/L (ref 38–126)
ALT: 13 U/L — ABNORMAL LOW (ref 14–54)
ANION GAP: 7 (ref 5–15)
AST: 16 U/L (ref 15–41)
Albumin: 2.3 g/dL — ABNORMAL LOW (ref 3.5–5.0)
BUN: 24 mg/dL — AB (ref 6–20)
CALCIUM: 8.5 mg/dL — AB (ref 8.9–10.3)
CO2: 22 mmol/L (ref 22–32)
CREATININE: 0.86 mg/dL (ref 0.44–1.00)
Chloride: 106 mmol/L (ref 101–111)
GFR calc Af Amer: >60 mL/min (ref >60)
GFR calc non Af Amer: >60 mL/min (ref >60)
GLUCOSE: 111 mg/dL — AB (ref 65–99)
Potassium: 3 mmol/L — ABNORMAL LOW (ref 3.5–5.1)
SODIUM: 135 mmol/L (ref 135–145)
Total Bilirubin: 0.4 mg/dL (ref 0.3–1.2)
Total Protein: 5.7 g/dL — ABNORMAL LOW (ref 6.5–8.1)

## 2015-04-27 LAB — URINE CULTURE
CULTURE: NO GROWTH
Colony Count: NO GROWTH

## 2015-04-27 LAB — MAGNESIUM: Magnesium: 1.9 mg/dL (ref 1.7–2.4)

## 2015-04-27 MED ORDER — TOPIRAMATE 25 MG PO TABS
50.0000 mg | ORAL_TABLET | Freq: Two times a day (BID) | ORAL | Status: DC
  Administered 2015-04-27 – 2015-05-02 (×11): 50 mg via ORAL
  Filled 2015-04-27 (×12): qty 2

## 2015-04-27 MED ORDER — ALPRAZOLAM 0.5 MG PO TABS
0.5000 mg | ORAL_TABLET | Freq: Two times a day (BID) | ORAL | Status: DC | PRN
  Administered 2015-04-27 – 2015-05-02 (×10): 0.5 mg via ORAL
  Filled 2015-04-27 (×10): qty 1

## 2015-04-27 MED ORDER — CALCIUM CARBONATE ANTACID 500 MG PO CHEW
1.0000 | CHEWABLE_TABLET | Freq: Once | ORAL | Status: AC | PRN
  Administered 2015-04-27: 200 mg via ORAL
  Filled 2015-04-27: qty 1

## 2015-04-27 MED ORDER — SERTRALINE HCL 100 MG PO TABS
100.0000 mg | ORAL_TABLET | Freq: Two times a day (BID) | ORAL | Status: DC
  Administered 2015-04-27 – 2015-05-02 (×11): 100 mg via ORAL
  Filled 2015-04-27 (×13): qty 1

## 2015-04-27 MED ORDER — POTASSIUM CHLORIDE CRYS ER 20 MEQ PO TBCR
40.0000 meq | EXTENDED_RELEASE_TABLET | Freq: Two times a day (BID) | ORAL | Status: AC
  Administered 2015-04-27 (×2): 40 meq via ORAL
  Filled 2015-04-27 (×2): qty 2

## 2015-04-27 NOTE — Progress Notes (Signed)
Subjective: No overnight events. NSR on telemetry throughout the evening. S/p percutaneous cholecystostomy. Still quite tender on exam, feels nauseous this AM. No fever, chills, or vomiting.    Objective: Vital signs in last 24 hours: Filed Vitals:   04/26/15 2105 04/26/15 2244 04/27/15 0514 04/27/15 0756  BP:   123/64   Pulse: 89 75 79   Temp:   99 F (37.2 C)   TempSrc:   Oral   Resp: 18 18 18    Height:      Weight:   221 lb 5.5 oz (100.4 kg)   SpO2: 96% 95% 98% 95%   Weight change: -4 lb 15.3 oz (-2.249 kg)  Intake/Output Summary (Last 24 hours) at 04/27/15 5701 Last data filed at 04/27/15 0700  Gross per 24 hour  Intake    750 ml  Output    210 ml  Net    540 ml   Physical Exam: General: Elderly white female, alert, cooperative, NAD. HEENT: PERRL, EOMI. Moist mucus membranes Neck: Full range of motion without pain, supple, no lymphadenopathy or carotid bruits Lungs: Clear to ascultation bilaterally, normal work of respiration, no wheezes, rales, rhonchi Heart: RRR, no murmurs, gallops, or rubs Abdomen: Soft, non-tender, non-distended, BS + Extremities: No cyanosis, clubbing, or edema Neurologic: Alert & oriented X3, cranial nerves II-XII intact, strength grossly intact, sensation intact to light touch    Lab Results: Basic Metabolic Panel:  Recent Labs Lab 04/26/15 0541 04/27/15 0617  NA 136 135  K 3.5 3.0*  CL 107 106  CO2 21* 22  GLUCOSE 85 111*  BUN 17 24*  CREATININE 0.79 0.86  CALCIUM 8.7* 8.5*  MG 1.9 1.9  PHOS 3.2  --    Liver Function Tests:  Recent Labs Lab 04/25/15 1506 04/27/15 0617  AST 25 16  ALT 15 13*  ALKPHOS 73 79  BILITOT 0.4 0.4  PROT 7.8 5.7*  ALBUMIN 3.6 2.3*    Recent Labs Lab 04/25/15 1506  LIPASE 16*   CBC:  Recent Labs Lab 04/25/15 1506 04/26/15 0541 04/27/15 0617  WBC 18.6* 24.0* 16.7*  NEUTROABS 16.7* 20.4*  --   HGB 15.1* 13.0 11.4*  HCT 44.1 38.4 33.9*  MCV 98.0 98.2 97.7  PLT 292 264 237    Cardiac Enzymes:  Recent Labs Lab 04/25/15 2347 04/26/15 0541 04/26/15 1110  TROPONINI 0.03 0.03 <0.03   CBG:  Recent Labs Lab 04/26/15 0557  GLUCAP 103*   Urinalysis:  Recent Labs Lab 04/25/15 1656  COLORURINE AMBER*  LABSPEC 1.023  PHURINE 7.5  GLUCOSEU NEGATIVE  HGBUR NEGATIVE  BILIRUBINUR SMALL*  KETONESUR NEGATIVE  PROTEINUR 30*  UROBILINOGEN 1.0  NITRITE NEGATIVE  LEUKOCYTESUR SMALL*   Studies/Results: Dg Chest 2 View  04/25/2015   CLINICAL DATA:  Recent tripping injury with persistence shortness of Breath  EXAM: CHEST  2 VIEW  COMPARISON:  01/08/2014  FINDINGS: Cardiac shadow is stable. The lungs are clear without evidence of pneumothorax. No focal infiltrate is seen. No acute bony abnormality is noted. No pneumothorax is noted. Chronic degenerative changes of the acromioclavicular joints are seen.  IMPRESSION: No acute abnormality noted.   Electronically Signed   By: Inez Catalina M.D.   On: 04/25/2015 16:29   Ct Abdomen Pelvis W Contrast  04/25/2015   CLINICAL DATA:  Right side abdominal pain for 3 days. Nausea, vomiting.  EXAM: CT ABDOMEN AND PELVIS WITH CONTRAST  TECHNIQUE: Multidetector CT imaging of the abdomen and pelvis was performed using the standard protocol  following bolus administration of intravenous contrast.  CONTRAST:  175mL OMNIPAQUE IOHEXOL 300 MG/ML  SOLN  COMPARISON:  None.  FINDINGS: Lower chest: Lung bases are clear. No effusions. Heart is normal size.  Hepatobiliary: There is gallbladder wall thickening and pericholecystic fluid compatible with acute cholecystitis. At least 2 gallstones within the gallbladder neck, the largest 10 mm. Common bile duct is dilated, measuring 10 mm. No visible distal ductal stone. Mild intrahepatic biliary ductal dilatation. No focal hepatic abnormality.  Pancreas: Fatty replacement without focal mass.  Spleen: No focal abnormality.  Normal size.  Adrenals/Urinary Tract: Fullness of the adrenal glands bilaterally  compatible with hyperplasia. No focal renal abnormality or hydronephrosis. Urinary bladder is decompressed, grossly unremarkable.  Stomach/Bowel: Sigmoid diverticulosis. No active diverticulitis. Stomach and small bowel are unremarkable.  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 No retroperitoneal or mesenteric adenopathy.  Reproductive: Prior and hysterectomy.  No adnexal masses.  Other: No free fluid or free air.  Musculoskeletal: No focal bone lesion or acute bony abnormality.  IMPRESSION: At least 2 gallstones lodged within the gallbladder neck. Marked gallbladder wall thickening and pericholecystic fluid compatible with acute cholecystitis.  Biliary ductal dilatation also noted. No visible ductal stones, but a distal CBD stone cannot be excluded.   Electronically Signed   By: Rolm Baptise M.D.   On: 04/25/2015 18:19   Ir Perc Cholecystostomy  04/26/2015   CLINICAL DATA:  74 year old with acute cholecystitis and not a good surgical candidate at this time.  EXAM: ULTRASOUND AND FLUOROSCOPIC GUIDED CHOLECYSTOSTOMY TUBE PLACEMENT  Physician: Stephan Minister. Henn, MD  FLUOROSCOPY TIME:  1 minutes and 30 seconds.  46.31 mGy  MEDICATIONS: 1.5 mg Versed, 75 mcg fentanyl. A radiology nurse monitored the patient for moderate sedation.  ANESTHESIA/SEDATION: Moderate sedation time: 15 minutes  PROCEDURE: The procedure was explained to the patient. The risks and benefits of the procedure were discussed and the patient's questions were addressed. Informed consent was obtained from the patient. Patient was placed supine on the interventional table. The right upper abdomen was evaluated with ultrasound. The right upper abdomen was prepped and draped in sterile fashion. Maximal barrier sterile technique was utilized including caps, mask, sterile gowns, sterile gloves, sterile drape, hand hygiene and skin antiseptic. 1% lidocaine was used for local anesthetic.  A 21 gauge needle was directed into the gallbladder from a transhepatic approach. A 0.018 wire was advanced of the gallbladder with fluoroscopy. An Accustick dilator set was placed. A small amount of brown purulent fluid was aspirated. Tract was dilated over a J-wire. A 10.2 [place] multipurpose drain was advanced over the wire and reconstituted in the gallbladder. Approximately 90 mL of purulent biliary fluid was aspirated. Catheter was sutured to the skin and attached to gravity bag. Fluid was sent for culture. Fluoroscopic and ultrasound images were taken and saved for documentation.  FINDINGS: Gallbladder was moderately distended with a small amount of wall thickening and/or pericholecystic fluid. Tube was successful placed in the gallbladder and the gallbladder was decompressed at the end of the procedure.  Estimated blood loss: Minimal  COMPLICATIONS: None  IMPRESSION: Successful percutaneous cholecystostomy tube placement.   Electronically Signed   By: Markus Daft M.D.   On: 04/26/2015 17:38   Medications: I have reviewed the patient's current medications. Scheduled Meds: . ketorolac  15 mg Intravenous 4 times per day  . mometasone-formoterol  2 puff Inhalation BID  . pantoprazole (PROTONIX) IV  40 mg  Intravenous QHS  . piperacillin-tazobactam (ZOSYN)  IV  3.375 g Intravenous 3 times per day  . tiotropium  18 mcg Inhalation Daily   Continuous Infusions:   PRN Meds:.acetaminophen **OR** acetaminophen, albuterol, morphine injection, ondansetron **OR** ondansetron (ZOFRAN) IV, polyvinyl alcohol   Assessment/Plan: 74 y/o F w/ PMHx of HTN, chronic dCHF, GERD, depression, anxiety, and chronic pain, admitted for acute cholecystitis.    Acute Cholecystitis: Now s/p percutaneous cholecystostomy. Feels better today. Leukocytosis improved. Blood cultures still negative to date, biliary fluid culture shows gram positive rods and gram negative rods. Patient to keep cholecystostomy tube for 6 weeks, then  have cholecystectomy per surgery notes.  -Continue morphine 2 mg q4h prn + Toradol 50 mg q6h prn  -Continue Zofran 4 mg q6h prn -Continue Protonix 40 mg IV -D/c IVF -Continue Zosyn (day 3/7) -Follow-up blood, biliary fluid cultures -Continue clears for now; advance as tolerated  Atrial Flutter w/ Aberrancy: No further arrhythmias overnight. Seen by EP yesterday, appreciate input.  -ECHO pending -Continue telemetry -Supplement electrolytes, pain control, etc.   HTN: Normotensive.  -Continue to hold home meds for now  Chronic dCHF: EF 60-65% with grade 1 diastolic dysfunction as noted on TTE December 2011. Her medications include HCTZ and ASA 81 mg. -Holding for now  COPD: Stable.  -Continue Dulera, Spiriva  OSA: Stable. -Continue CPAP qhs  Depression/Anxiety/Chronic Pain: Stable. Still some concern for in-hospital delirium given age, pain, and narcotic medications.  -Restart Zoloft 100 mg bid -Restart Topamax 50 mg bid -Restart Xanax 0.5 mg qhs for now (takes differently at home). Can change dose to home does if mental status allows.   DVT/PE PPx: SCDs  Dispo: Disposition is deferred at this time, awaiting improvement of current medical problems. Anticipated discharge in 2-3 days.   The patient does have a current PCP Bertha Stakes, MD) and does need an [facility] hospital follow-up appointment after discharge.  The patient does have transportation limitations that hinder transportation to clinic appointments.  .Services Needed at time of discharge: Y = Yes, Blank = No PT:   OT:   RN:   Equipment:   Other:     LOS: 2 days   Corky Sox, MD 04/27/2015, 9:22 AM

## 2015-04-27 NOTE — Progress Notes (Signed)
Pt is c/o nausea and abdominal pain given PRN medications, resting comfortably at the time. Daughter at the bedside. O2 applied for comfort. Did not place CPAP d/t nausea.

## 2015-04-27 NOTE — Progress Notes (Signed)
Name: Jennifer [name] MRN: [id number] Date: 04/27/2015 LOS: 2  Subjective: Jennifer [name] had successfully received a percutaneous cholecystostomy tube yesterday. There were no perioperative or postoperative complications. Cardiology scheduled a follow-up appointment w/ Dr. Francena Hanly after discharge and anticipate scheduling an elective interval cholecystectomy in approximately 6-8 weeks.  Last night at 1:40am, Jennifer [name] had an episode of wide complex tachycardia with cycle length of 310 msec.  She was asymptomatic and denied palpitations, CP or syncope. Cardiology was consulted and felt that her arrhythmia is most likely  atrial flutter with abarrancy rather than VT. Her electrolytes (low Mg/low K) were repleted and an echo was ordered.   Jennifer [name] continues to have nausea, bloating, shortness of breath and constant diffuse abdominal pain. Her daughter also reports that Jennifer [name] was incoherent overnight, and was confusing night and day. Jennifer [name] denies pain radiating to her shoulder, chest or scapula, diarrhea, vomiting, coughing, palpitations or chest pain. She also denies syncope or orthopnea, but also has not been able to sit up on her own due to fatigue and abdominal pain. She reports gas but not belching. She is tolerating a clear liquid diet, although her daughter reports that patient's total intake is low. She has not had a bowel movement since she has been admitted.    Objective: Vital signs in last 24 hours: Filed Vitals:   04/26/15 2105 04/26/15 2244 04/27/15 0514 04/27/15 0756  BP:   123/64   Pulse: 89 75 79   Temp:   99 F (37.2 C)   TempSrc:   Oral   Resp: 18 18 18    Height:      Weight:   221 lb 5.5 oz (100.4 kg)   SpO2: 96% 95% 98% 95%    Weight change: -4 lb 15.3 oz (-2.249 kg) Filed Weights   04/25/15 2140 04/26/15 0500 04/27/15 0514  Weight: 226 lb 4.8 oz (102.649 kg) 230 lb 1.6 oz (104.373 kg) 221 lb 5.5 oz (100.4 kg)    I/O: I: 600 mL PO O: 210 mL from biliary tube  RLQ.  0 BM  Intake/Output Summary (Last 24 hours) at 04/27/15 1027 Last data filed at 04/27/15 0700  Gross per 24 hour  Intake    750 ml  Output    210 ml  Net    540 ml    Physical Exam General appearance: Jennifer [name] is was fatigue and in moderate distress this morning, but was cooperative and pleasant. Lungs: Anterior auscultation; upper respiratory crackles. Tachypneic with labored breathing. Shallow breathing through mouth. Heart: Irregular rhythm with irregular skipped beats.  Abdomen: Abdomen distended, and diffusely tender. Guarding, Rebound was not able to be performed due to pain. Normal bowel sounds. Extremities: No edema noted on Sarabi Sockwell or UE Pulses: 2+ Radial, TD, PD pulses present Skin: no jaundice, skin feels cool and clammy upon touch. Neuro: Oriented to self, time, location and was able to hold a conversation.  Lab Results:  Mg: 1.9  CMP Latest Ref Rng 04/27/2015 04/26/2015 04/26/2015  Glucose 65 - 99 mg/dL 111(H) 85 106(H)  BUN 6 - 20 mg/dL 24(H) 17 14  Creatinine 0.44 - 1.00 mg/dL 0.86 0.79 0.70  Sodium 135 - 145 mmol/L 135 136 135  Potassium 3.5 - 5.1 mmol/L 3.0(L) 3.5 3.0(L)  Chloride 101 - 111 mmol/L 106 107 104  CO2 22 - 32 mmol/L 22 21(L) 20(L)  Calcium 8.9 - 10.3 mg/dL 8.5(L) 8.7(L) 9.0  Total Protein 6.5 - 8.1 g/dL 5.7(L) - -  Total Bilirubin 0.3 - 1.2 mg/dL 0.4 - -  Alkaline Phos 38 - 126 U/L 79 - -  AST 15 - 41 U/L 16 - -  ALT 14 - 54 U/L 13(L) - -    CBC Latest Ref Rng 04/27/2015 04/26/2015 04/25/2015  WBC 4.0 - 10.5 K/uL 16.7(H) 24.0(H) 18.6(H)  Hemoglobin 12.0 - 15.0 g/dL 11.4(L) 13.0 15.1(H)  Hematocrit 36.0 - 46.0 % 33.9(L) 38.4 44.1  Platelets 150 - 400 K/uL 237 264 292    Micro Results: Body Fluid Culture: Gallbladder : Received: 05/21  ABUNDANT WBC PRESENT, PREDOMINANTLY PMN  ABUNDANT GRAM POSITIVE RODS  MODERATE GRAM NEGATIVE RODS  Gram Stain Report Called to,Read Back By and Verified With: Gram Stain Report Called to,Read Back By and  Verified With: Elie Goody RN on 04/27/15 at 03:10 by Rise Mu  Performed at [facility] reincubated for better growth  Performed at [facility]  PENDING          Blood culture x2: Received: 05/21 - Pending: no growth to date  Urine culture: Received: 05/20 - Final, No growth  Studies/Results: IR Percutaneous Cholecystostomy: Performed: 05/21, Physician: Stephan Minister. Anselm Pancoast, MD Findings: Gallbladder was moderately distended with a small amount of wall thickening and/or pericholecystic fluid. Tube was successful placed in the gallbladder and the gallbladder was decompressed at the end of the procedure. Estimated blood loss: Minimal, No complications.   Echocardiogram: Performed: 05/21, Johny Chess PDCS -Pending  EKG: Performed: 05/21 -Pending   Medications: I have reviewed the patient's current medications. Scheduled Meds: . ketorolac  15 mg Intravenous 4 times per day  . mometasone-formoterol  2 puff Inhalation BID  . pantoprazole (PROTONIX) IV  40 mg Intravenous QHS  . piperacillin-tazobactam (ZOSYN)  IV  3.375 g Intravenous 3 times per day  . potassium chloride  40 mEq Oral BID  . tiotropium  18 mcg Inhalation Daily   PRN Meds: acetaminophen **OR** acetaminophen, albuterol, morphine injection, ondansetron **OR** ondansetron (ZOFRAN) IV, polyvinyl alcohol  Assessment/Plan:  Jennifer [name] is a 74 y.o. female with a PMH of HTN, chronic diastolic HF, COPD, fibromyalgia who is on hospital day 2 at [facility] for acute cholecystitis.   Sepsis secondary to acute cholecystitis: Jennifer [name] has been afebrile and has had a decreasing WBC 16.7, from 24 yesterday. Surgery did not recommend Jennifer [name] to undergo urgent cholecystectomy, and recommended percutaneous cholecystostomy for 6 wks, to decrease inflammation. Results from fluoroscopy was consistent with acute cholecystitis.  Procedure was successful with no complications, and has  drained 210 mL since placement.  Fluid from percutaneous cholecystostomy grew WBC, mostly PMNs, gram+ rods and some gram - rods. Urine cultures show no growth; blood cultures still pending. Lactic acid yesterday was 1.5, decreasing from 3.78 on admission. She is on day 2/7 of zosyn.  -zosyn iv q8 (Day 2/7)  -clear liquid diet -Medlocked -F/u blood cultures x2 (05/21) -F/u body fluid cultures (05/21) -morphine 2 mg q4hprn + toradol 50 mg q6h prn -ketorolac 15 mg iv q6h -zofran 4 mg po or iv q6hprn  -tylenol 650 mg po or pr q6hprn -protonix 40 mg iv qhs -appreciate surgery  Aberrant arrhythmia: Cardiology consult on 05/21 felt that patient's arrhythmia was more likely atrial flutter with abarrancy rather than VT. Patient is still asymptomatic and has denied palpitations, chest pain, orthopnea, PND, lower extremity edema, dizziness, presyncope, syncope, or neurologic sequela since the occurrence. Mg and K were low,  which could also be a cause of the arrthymia and have been repleted.Troponins negative x3, which is reassuring. An Echo was performed and results are pending. -F/u cardiology recommendations from echo.  HTN: BP 100s-130s/70s-80s since admission.  - Continue to hold home medications of  HCTZ 12.5 mg daily, nifedipine 90 mg daily, ASA 81 mg daily.  Chronic Diastolic CHF: No acute problems during admission. Last TTE 11/2010 w EF 01-75%, grade 1 diastolic dysfunction. At home she is on ASA 81 mg daily. - Continue to hold ASA 81 mg daily  COPD: Patient is having shortness of breath, but reports it is not different from baseline.  -Continue home medication of dulera1 puff bid, spiriva 18 mch inh daily, and albuterol 1-2 puff q6hprn  Chronic pain: At home she takes norco 7.5-325 1 tab qidprn, amitriptyline 50 mg qhs, carisoprodol 350 mg dailyprn. -Restart Zoloft 100 mg bid -Restart Topamax 50 mg bid -Restart Xanax 0.5 mg qhs for now (takes differently at home). Can change dose to home  does if mental status allows.   OSA: On CPAP qhs -Continue CPAP qhs  FEN F: Medlocked E: Hypokalemia: 3.0 today, decreased from 3.5 yesterday.  - Will receive KCl 40 mgEq liquid. If she is unable to tolerate liquid, will change order for IV.  N: Clear liquid  VTE prophylaxis - SCDs preoperatively  Code: Full  Dispo: Disposition is deferred at this time.   Jennifer [name] is currently established with Axel Filler, MD as her PCP and will need to schedule a follow-up apt w/ him in 7-10 days after discharge.    .Services Needed at time of discharge: Y = Yes, Blank = No PT:   OT:   RN:   Equipment:   Other:     LOS: 2 days   Carla Drape, Med Student 04/27/2015, 10:27 AM

## 2015-04-27 NOTE — Progress Notes (Signed)
Patient ID: Jennifer [name], female   DOB: May 28, 1941, 74 y.o.   MRN: [id number]     [facility]      [address] [place], [place] [address]    Phone: [phone] FAX: [phone]     Subjective: WBC down.  Afebrile.  Pain is about the same.  Tolerating clears.   Objective:  Vital signs:  Filed Vitals:   04/26/15 2105 04/26/15 2244 04/27/15 0514 04/27/15 0756  BP:   123/64   Pulse: 89 75 79   Temp:   99 F (37.2 C)   TempSrc:   Oral   Resp: _0 Height:      Weight:   100.4 kg (221 lb 5.5 oz)   SpO2: 96% 95% 98% 95%    Last BM Date: 04/25/15  Intake/Output   Yesterday:  05/21 0701 - 05/22 0700 In: 390 [P.O.:240; IV Piggyback:150] Out: 210 [Drains:210] This shift: I/O last 3 completed shifts: In: 7544 [P.O.:250; I.V.:555; IV Piggyback:650] Out: 210 [Drains:210]     Physical Exam: General: Pt awake/alert/oriented x4 in no acute distress Abdomen: Soft.  Nondistended.  Generalized ttp.  Drain with bilious output.  No evidence of peritonitis.  No incarcerated hernias.    Problem List:   Principal Problem:   Acute cholecystitis Active Problems:   Hyperlipidemia   Depression   Obstructive sleep apnea   Meniere's disease   Essential hypertension   Diastolic heart failure   COPD with emphysema   Chronic low back pain   Fibromyalgia   Hypokalemia   Lactic acidosis   Paroxysmal ventricular tachycardia   Wide-complex tachycardia   Paroxysmal atrial fibrillation   Shortness of breath   Morbid obesity    Results:   Labs: Results for orders placed or performed during the hospital encounter of 04/25/15 (from the past 48 hour(s))  CBC with Differential     Status: Abnormal   Collection Time: 04/25/15  3:06 PM  Result Value Ref Range   WBC 18.6 (H) 4.0 - 10.5 K/uL   RBC 4.50 3.87 - 5.11 MIL/uL   Hemoglobin 15.1 (H) 12.0 - 15.0 g/dL   HCT 44.1 36.0 - 46.0 %   MCV 98.0 78.0 - 100.0 fL   MCH 33.6 26.0  - 34.0 pg   MCHC 34.2 30.0 - 36.0 g/dL   RDW 12.7 11.5 - 15.5 %   Platelets 292 150 - 400 K/uL   Neutrophils Relative % 90 (H) 43 - 77 %   Lymphocytes Relative 3 (L) 12 - 46 %   Monocytes Relative 7 3 - 12 %   Eosinophils Relative 0 0 - 5 %   Basophils Relative 0 0 - 1 %   Neutro Abs 16.7 (H) 1.7 - 7.7 K/uL   Lymphs Abs 0.6 (L) 0.7 - 4.0 K/uL   Monocytes Absolute 1.3 (H) 0.1 - 1.0 K/uL   Eosinophils Absolute 0.0 0.0 - 0.7 K/uL   Basophils Absolute 0.0 0.0 - 0.1 K/uL   RBC Morphology POLYCHROMASIA PRESENT    WBC Morphology MILD LEFT SHIFT (1-5% METAS, OCC MYELO, OCC BANDS)     Comment: TOXIC GRANULATION  Comprehensive metabolic panel     Status: Abnormal   Collection Time: 04/25/15  3:06 PM  Result Value Ref Range   Sodium 136 135 - 145 mmol/L   Potassium 3.0 (L) 3.5 - 5.1 mmol/L   Chloride 105 101 - 111 mmol/L   CO2 18 (L) 22 -  32 mmol/L   Glucose, Bld 165 (H) 65 - 99 mg/dL   BUN 12 6 - 20 mg/dL   Creatinine, Ser 0.81 0.44 - 1.00 mg/dL   Calcium 9.9 8.9 - 10.3 mg/dL   Total Protein 7.8 6.5 - 8.1 g/dL   Albumin 3.6 3.5 - 5.0 g/dL   AST 25 15 - 41 U/L   ALT 15 14 - 54 U/L   Alkaline Phosphatase 73 38 - 126 U/L   Total Bilirubin 0.4 0.3 - 1.2 mg/dL   GFR calc non Af Amer >60 >60 mL/min   GFR calc Af Amer >60 >60 mL/min    Comment: (NOTE) The eGFR has been calculated using the CKD EPI equation. This calculation has not been validated in all clinical situations. eGFR's persistently <60 mL/min signify possible Chronic Kidney Disease.    Anion gap 13 5 - 15  Lipase, blood     Status: Abnormal   Collection Time: 04/25/15  3:06 PM  Result Value Ref Range   Lipase 16 (L) 22 - 51 U/L  I-stat troponin, ED (only if pt is 76 y.o. or older & pain is above umbilicus)  not at [facility], [facility]     Status: None   Collection Time: 04/25/15  3:12 PM  Result Value Ref Range   Troponin i, poc 0.01 0.00 - 0.08 ng/mL   Comment 3            Comment: Due to the release kinetics of cTnI, a negative  result within the first hours of the onset of symptoms does not rule out myocardial infarction with certainty. If myocardial infarction is still suspected, repeat the test at appropriate intervals.   I-Stat CG4 Lactic Acid, ED     Status: Abnormal   Collection Time: 04/25/15  3:14 PM  Result Value Ref Range   Lactic Acid, Venous 3.78 (HH) 0.5 - 2.0 mmol/L   Comment NOTIFIED PHYSICIAN   Urinalysis, Routine w reflex microscopic     Status: Abnormal   Collection Time: 04/25/15  4:56 PM  Result Value Ref Range   Color, Urine AMBER (A) YELLOW    Comment: BIOCHEMICALS MAY BE AFFECTED BY COLOR   APPearance CLEAR CLEAR   Specific Gravity, Urine 1.023 1.005 - 1.030   pH 7.5 5.0 - 8.0   Glucose, UA NEGATIVE NEGATIVE mg/dL   Hgb urine dipstick NEGATIVE NEGATIVE   Bilirubin Urine SMALL (A) NEGATIVE   Ketones, ur NEGATIVE NEGATIVE mg/dL   Protein, ur 30 (A) NEGATIVE mg/dL   Urobilinogen, UA 1.0 0.0 - 1.0 mg/dL   Nitrite NEGATIVE NEGATIVE   Leukocytes, UA SMALL (A) NEGATIVE  Urine culture     Status: None   Collection Time: 04/25/15  4:56 PM  Result Value Ref Range   Specimen Description URINE, RANDOM    Special Requests NONE    Colony Count NO GROWTH Performed at [facility]     Culture NO GROWTH Performed at [facility]     Report Status 04/27/2015 FINAL   Urine microscopic-add on     Status: None   Collection Time: 04/25/15  4:56 PM  Result Value Ref Range   Squamous Epithelial / LPF RARE RARE   WBC, UA 3-6 <3 WBC/hpf   RBC / HPF 0-2 <3 RBC/hpf   Bacteria, UA RARE RARE  I-Stat CG4 Lactic Acid, ED     Status: Abnormal   Collection Time: 04/25/15  7:02 PM  Result Value Ref Range   Lactic Acid, Venous  2.09 (HH) 0.5 - 2.0 mmol/L   Comment NOTIFIED PHYSICIAN   Troponin I (q 6hr x 3)     Status: None   Collection Time: 04/25/15 11:47 PM  Result Value Ref Range   Troponin I 0.03 <0.031 ng/mL    Comment:        NO INDICATION OF MYOCARDIAL INJURY.   Lactic  acid, plasma     Status: None   Collection Time: 04/26/15 12:00 AM  Result Value Ref Range   Lactic Acid, Venous 1.5 0.5 - 2.0 mmol/L  Magnesium     Status: Abnormal   Collection Time: 04/26/15 12:00 AM  Result Value Ref Range   Magnesium 1.4 (L) 1.7 - 2.4 mg/dL  Basic metabolic panel     Status: Abnormal   Collection Time: 04/26/15 12:00 AM  Result Value Ref Range   Sodium 135 135 - 145 mmol/L   Potassium 3.0 (L) 3.5 - 5.1 mmol/L   Chloride 104 101 - 111 mmol/L   CO2 20 (L) 22 - 32 mmol/L   Glucose, Bld 106 (H) 65 - 99 mg/dL   BUN 14 6 - 20 mg/dL   Creatinine, Ser 0.70 0.44 - 1.00 mg/dL   Calcium 9.0 8.9 - 10.3 mg/dL   GFR calc non Af Amer >60 >60 mL/min   GFR calc Af Amer >60 >60 mL/min    Comment: (NOTE) The eGFR has been calculated using the CKD EPI equation. This calculation has not been validated in all clinical situations. eGFR's persistently <60 mL/min signify possible Chronic Kidney Disease.    Anion gap 11 5 - 15  CBC with Differential/Platelet     Status: Abnormal   Collection Time: 04/26/15  5:41 AM  Result Value Ref Range   WBC 24.0 (H) 4.0 - 10.5 K/uL   RBC 3.91 3.87 - 5.11 MIL/uL   Hemoglobin 13.0 12.0 - 15.0 g/dL   HCT 38.4 36.0 - 46.0 %   MCV 98.2 78.0 - 100.0 fL   MCH 33.2 26.0 - 34.0 pg   MCHC 33.9 30.0 - 36.0 g/dL   RDW 13.2 11.5 - 15.5 %   Platelets 264 150 - 400 K/uL   Neutrophils Relative % 85 (H) 43 - 77 %   Lymphocytes Relative 8 (L) 12 - 46 %   Monocytes Relative 7 3 - 12 %   Eosinophils Relative 0 0 - 5 %   Basophils Relative 0 0 - 1 %   Neutro Abs 20.4 (H) 1.7 - 7.7 K/uL   Lymphs Abs 1.9 0.7 - 4.0 K/uL   Monocytes Absolute 1.7 (H) 0.1 - 1.0 K/uL   Eosinophils Absolute 0.0 0.0 - 0.7 K/uL   Basophils Absolute 0.0 0.0 - 0.1 K/uL   RBC Morphology POLYCHROMASIA PRESENT    WBC Morphology MILD LEFT SHIFT (1-5% METAS, OCC MYELO, OCC BANDS)     Comment: TOXIC GRANULATION  Magnesium     Status: None   Collection Time: 04/26/15  5:41 AM  Result  Value Ref Range   Magnesium 1.9 1.7 - 2.4 mg/dL  Troponin I (q 6hr x 3)     Status: None   Collection Time: 04/26/15  5:41 AM  Result Value Ref Range   Troponin I 0.03 <0.031 ng/mL    Comment:        NO INDICATION OF MYOCARDIAL INJURY.   Basic metabolic panel     Status: Abnormal   Collection Time: 04/26/15  5:41 AM  Result Value Ref Range   Sodium  136 135 - 145 mmol/L   Potassium 3.5 3.5 - 5.1 mmol/L   Chloride 107 101 - 111 mmol/L   CO2 21 (L) 22 - 32 mmol/L   Glucose, Bld 85 65 - 99 mg/dL   BUN 17 6 - 20 mg/dL   Creatinine, Ser 0.79 0.44 - 1.00 mg/dL   Calcium 8.7 (L) 8.9 - 10.3 mg/dL   GFR calc non Af Amer >60 >60 mL/min   GFR calc Af Amer >60 >60 mL/min    Comment: (NOTE) The eGFR has been calculated using the CKD EPI equation. This calculation has not been validated in all clinical situations. eGFR's persistently <60 mL/min signify possible Chronic Kidney Disease.    Anion gap 8 5 - 15  Phosphorus     Status: None   Collection Time: 04/26/15  5:41 AM  Result Value Ref Range   Phosphorus 3.2 2.5 - 4.6 mg/dL  Glucose, capillary     Status: Abnormal   Collection Time: 04/26/15  5:57 AM  Result Value Ref Range   Glucose-Capillary 103 (H) 65 - 99 mg/dL  Troponin I (q 6hr x 3)     Status: None   Collection Time: 04/26/15 11:10 AM  Result Value Ref Range   Troponin I <0.03 <0.031 ng/mL    Comment:        NO INDICATION OF MYOCARDIAL INJURY.   APTT     Status: Abnormal   Collection Time: 04/26/15 11:10 AM  Result Value Ref Range   aPTT 42 (H) 24 - 37 seconds    Comment:        IF BASELINE aPTT IS ELEVATED, SUGGEST PATIENT RISK ASSESSMENT BE USED TO DETERMINE APPROPRIATE ANTICOAGULANT THERAPY.   Protime-INR     Status: Abnormal   Collection Time: 04/26/15 11:10 AM  Result Value Ref Range   Prothrombin Time 19.4 (H) 11.6 - 15.2 seconds   INR 1.63 (H) 0.00 - 1.49  Body fluid culture     Status: None (Preliminary result)   Collection Time: 04/26/15  6:13 PM   Result Value Ref Range   Specimen Description GALL BLADDER    Special Requests NONE    Gram Stain      ABUNDANT WBC PRESENT, PREDOMINANTLY PMN ABUNDANT GRAM POSITIVE RODS MODERATE GRAM NEGATIVE RODS Gram Stain Report Called to,Read Back By and Verified With: Gram Stain Report Called to,Read Back By and Verified With: Elie Goody RN on 04/27/15 at 03:10 by Rise Mu Performed at [facility]    Culture      Culture reincubated for better growth Performed at [facility]    Report Status PENDING   CBC     Status: Abnormal   Collection Time: 04/27/15  6:17 AM  Result Value Ref Range   WBC 16.7 (H) 4.0 - 10.5 K/uL   RBC 3.47 (L) 3.87 - 5.11 MIL/uL   Hemoglobin 11.4 (L) 12.0 - 15.0 g/dL   HCT 33.9 (L) 36.0 - 46.0 %   MCV 97.7 78.0 - 100.0 fL   MCH 32.9 26.0 - 34.0 pg   MCHC 33.6 30.0 - 36.0 g/dL   RDW 13.5 11.5 - 15.5 %   Platelets 237 150 - 400 K/uL    Imaging / Studies: Dg Chest 2 View  04/25/2015   CLINICAL DATA:  Recent tripping injury with persistence shortness of Breath  EXAM: CHEST  2 VIEW  COMPARISON:  01/08/2014  FINDINGS: Cardiac shadow is stable. The lungs are clear without evidence of pneumothorax. No  focal infiltrate is seen. No acute bony abnormality is noted. No pneumothorax is noted. Chronic degenerative changes of the acromioclavicular joints are seen.  IMPRESSION: No acute abnormality noted.   Electronically Signed   By: Inez Catalina M.D.   On: 04/25/2015 16:29   Ct Abdomen Pelvis W Contrast  04/25/2015   CLINICAL DATA:  Right side abdominal pain for 3 days. Nausea, vomiting.  EXAM: CT ABDOMEN AND PELVIS WITH CONTRAST  TECHNIQUE: Multidetector CT imaging of the abdomen and pelvis was performed using the standard protocol following bolus administration of intravenous contrast.  CONTRAST:  16m OMNIPAQUE IOHEXOL 300 MG/ML  SOLN  COMPARISON:  None.  FINDINGS: Lower chest: Lung bases are clear. No effusions. Heart is normal size.  Hepatobiliary: There is  gallbladder wall thickening and pericholecystic fluid compatible with acute cholecystitis. At least 2 gallstones within the gallbladder neck, the largest 10 mm. Common bile duct is dilated, measuring 10 mm. No visible distal ductal stone. Mild intrahepatic biliary ductal dilatation. No focal hepatic abnormality.  Pancreas: Fatty replacement without focal mass.  Spleen: No focal abnormality.  Normal size.  Adrenals/Urinary Tract: Fullness of the adrenal glands bilaterally compatible with hyperplasia. No focal renal abnormality or hydronephrosis. Urinary bladder is decompressed, grossly unremarkable.  Stomach/Bowel: Sigmoid diverticulosis. No active diverticulitis. Stomach and small bowel are unremarkable.  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 No retroperitoneal or mesenteric adenopathy.  Reproductive: Prior and hysterectomy.  No adnexal masses.  Other: No free fluid or free air.  Musculoskeletal: No focal bone lesion or acute bony abnormality.  IMPRESSION: At least 2 gallstones lodged within the gallbladder neck. Marked gallbladder wall thickening and pericholecystic fluid compatible with acute cholecystitis.  Biliary ductal dilatation also noted. No visible ductal stones, but a distal CBD stone cannot be excluded.   Electronically Signed   By: KRolm BaptiseM.D.   On: 04/25/2015 18:19   Ir Perc Cholecystostomy  04/26/2015   CLINICAL DATA:  74year old with acute cholecystitis and not a good surgical candidate at this time.  EXAM: ULTRASOUND AND FLUOROSCOPIC GUIDED CHOLECYSTOSTOMY TUBE PLACEMENT  Physician: AStephan Minister Henn, MD  FLUOROSCOPY TIME:  1 minutes and 30 seconds.  46.31 mGy  MEDICATIONS: 1.5 mg Versed, 75 mcg fentanyl. A radiology nurse monitored the patient for moderate sedation.  ANESTHESIA/SEDATION: Moderate sedation time: 15 minutes  PROCEDURE: The procedure was explained to the patient. The risks and benefits of the procedure  were discussed and the patient's questions were addressed. Informed consent was obtained from the patient. Patient was placed supine on the interventional table. The right upper abdomen was evaluated with ultrasound. The right upper abdomen was prepped and draped in sterile fashion. Maximal barrier sterile technique was utilized including caps, mask, sterile gowns, sterile gloves, sterile drape, hand hygiene and skin antiseptic. 1% lidocaine was used for local anesthetic. A 21 gauge needle was directed into the gallbladder from a transhepatic approach. A 0.018 wire was advanced of the gallbladder with fluoroscopy. An Accustick dilator set was placed. A small amount of brown purulent fluid was aspirated. Tract was dilated over a J-wire. A 10.2 [place] drain was advanced over the wire and reconstituted in the gallbladder. Approximately 90 mL of purulent biliary fluid was aspirated. Catheter was sutured to the skin and attached to gravity bag. Fluid was sent for culture. Fluoroscopic and ultrasound images were taken and saved for documentation.  FINDINGS: Gallbladder was moderately distended with a small amount of  wall thickening and/or pericholecystic fluid. Tube was successful placed in the gallbladder and the gallbladder was decompressed at the end of the procedure.  Estimated blood loss: Minimal  COMPLICATIONS: None  IMPRESSION: Successful percutaneous cholecystostomy tube placement.   Electronically Signed   By: Markus Daft M.D.   On: 04/26/2015 17:38    Medications / Allergies:  Scheduled Meds: . ketorolac  15 mg Intravenous 4 times per day  . mometasone-formoterol  2 puff Inhalation BID  . pantoprazole (PROTONIX) IV  40 mg Intravenous QHS  . piperacillin-tazobactam (ZOSYN)  IV  3.375 g Intravenous 3 times per day  . tiotropium  18 mcg Inhalation Daily   Continuous Infusions:  PRN Meds:.acetaminophen **OR** acetaminophen, albuterol, morphine injection, ondansetron **OR** ondansetron  (ZOFRAN) IV, polyvinyl alcohol  Antibiotics: Anti-infectives    Start     Dose/Rate Route Frequency Ordered Stop   04/26/15 0400  piperacillin-tazobactam (ZOSYN) IVPB 3.375 g     3.375 g 12.5 mL/hr over 240 Minutes Intravenous 3 times per day 04/25/15 2346     04/25/15 2000  cefTRIAXone (ROCEPHIN) 1 g in dextrose 5 % 50 mL IVPB  Status:  Discontinued     1 g 100 mL/hr over 30 Minutes Intravenous  Once 04/25/15 1951 04/25/15 1956   04/25/15 2000  piperacillin-tazobactam (ZOSYN) IVPB 3.375 g     3.375 g 100 mL/hr over 30 Minutes Intravenous  Once 04/25/15 1956 04/25/15 2052       Assessment/Plan Sepsis Acute cholecystitis-s/p cholecystostomy tube, to remain for 6 weeks, then interval cholecystectomy.  F/U with Dr. Hulen Skains ID-zosyn D#[id number]/7 FEN-leave on clears, advance as pain improves  Daughter asking about home meds.  May resume from surgical standpoint   Erby Pian, ANP-BC [facility]   04/27/2015 8:06 AM

## 2015-04-27 NOTE — Progress Notes (Signed)
Referring Physician(s): CCS  Subjective:  Perc chole drain placed 5/21 Pt doing well Feels some better   Allergies: Flonase; Lisinopril; and Tetracycline  Medications: Prior to Admission medications   Medication Sig Start Date End Date Taking? Authorizing Provider  albuterol (PROAIR HFA) 108 (90 BASE) MCG/ACT inhaler INHALE 2 PUFFS BY MOUTH INTO LUNGS FOUR TIMES DAILY AS NEEDED Patient taking differently: Inhale 1-2 puffs into the lungs every 6 (six) hours as needed for wheezing or shortness of breath.  10/14/14  Yes Elsie Stain, MD  ALPRAZolam Duanne Moron) 0.5 MG tablet Take 1-1.5 tablets (0.5-0.75 mg total) by mouth 3 (three) times daily as needed for anxiety. Patient taking differently: Take 0.5-1 mg by mouth at bedtime as needed and may repeat dose one time if needed for anxiety. Take 1 mg daily at bedtime and take 0.5 mg as needed throughout the day for anxiety 01/03/15  Yes Bertha Stakes, MD  amitriptyline (ELAVIL) 50 MG tablet Take 1 tablet (50 mg total) by mouth at bedtime. 01/03/15  Yes Bertha Stakes, MD  aspirin 81 MG EC tablet Take 81 mg by mouth daily.     Yes Historical Provider, MD  Calcium Carb-Cholecalciferol 600-800 MG-UNIT TABS Take 1 tablet by mouth 2 (two) times daily.   Yes Historical Provider, MD  carisoprodol (SOMA) 350 MG tablet Take 1 tablet (350 mg total) by mouth daily as needed for muscle spasms. Patient taking differently: Take 350 mg by mouth daily.  01/03/15  Yes Bertha Stakes, MD  Cholecalciferol (VITAMIN D3) 1000 UNITS CAPS Take 1 capsule (1,000 Units total) by mouth daily. 01/23/15  Yes Bertha Stakes, MD  CINNAMON PO Take by mouth 2 (two) times daily.   Yes Historical Provider, MD  dextromethorphan-guaiFENesin (MUCINEX DM) 30-600 MG per 12 hr tablet Take 1 tablet by mouth 2 (two) times daily.   Yes Historical Provider, MD  docusate sodium (COLACE) 100 MG capsule Take 100 mg by mouth daily as needed for mild constipation or moderate constipation.   Yes  Historical Provider, MD  Fluticasone-Salmeterol (ADVAIR DISKUS) 250-50 MCG/DOSE AEPB Inhale 1 puff into the lungs 2 (two) times daily. 10/14/14  Yes Elsie Stain, MD  hydrochlorothiazide (HYDRODIURIL) 25 MG tablet Take 0.5 tablets (12.5 mg total) by mouth daily. 03/07/14  Yes Bertha Stakes, MD  HYDROcodone-acetaminophen (NORCO) 7.5-325 MG per tablet Take 1 tablet by mouth 4 (four) times daily as needed for moderate pain or severe pain. 04/07/15  Yes Aldine Contes, MD  hydroxypropyl methylcellulose (ISOPTO TEARS) 2.5 % ophthalmic solution Place 2 drops into both eyes 3 (three) times daily as needed. Dry eye   Yes Historical Provider, MD  ibuprofen (ADVIL,MOTRIN) 200 MG tablet Take 200 mg by mouth 4 (four) times daily as needed for moderate pain. For pain    Yes Historical Provider, MD  meclizine (ANTIVERT) 12.5 MG tablet Take 1 tablet (12.5 mg total) by mouth 2 (two) times daily as needed for dizziness. 11/20/14  Yes Bertha Stakes, MD  Multiple Vitamin (MULTIVITAMIN) tablet Take 1 tablet by mouth daily.   Yes Historical Provider, MD  NIFEdipine (ADALAT CC) 90 MG 24 hr tablet Take 1 tablet (90 mg total) by mouth daily. 09/06/14  Yes Bertha Stakes, MD  omeprazole (PRILOSEC) 20 MG capsule Take 1 capsule (20 mg total) by mouth 2 (two) times daily. 02/05/15  Yes Bertha Stakes, MD  potassium chloride (K-DUR) 10 MEQ tablet Take 2 tablets (20 mEq total) by mouth 2 (two) times daily. 06/11/14  Yes Bertha Stakes,  MD  ranitidine (ZANTAC) 150 MG tablet Take 1 tablet (150 mg total) by mouth at bedtime. 03/05/15  Yes Bertha Stakes, MD  Red Yeast Rice Extract (RED YEAST RICE PO) Take by mouth 2 (two) times daily.   Yes Historical Provider, MD  sertraline (ZOLOFT) 100 MG tablet Take 1 tablet (100 mg total) by mouth 2 (two) times daily. 09/06/14  Yes Bertha Stakes, MD  tiotropium (SPIRIVA HANDIHALER) 18 MCG inhalation capsule Place 1 capsule (18 mcg total) into inhaler and inhale daily. 10/14/14  Yes Elsie Stain, MD    topiramate (TOPAMAX) 50 MG tablet Take 1 tablet (50 mg total) by mouth 2 (two) times daily. 04/02/15  Yes Bertha Stakes, MD  triamcinolone cream (KENALOG) 0.1 % APPLY TOPICALLY TO THE AFFECTED AREA OF HAND TWICE DAILY AS NEEDED Patient taking differently: APPLY TOPICALLY TO THE AFFECTED AREA OF HAND TWICE DAILY AS NEEDED FOR HAND 03/07/14  Yes Bertha Stakes, MD     Vital Signs: BP 123/64 mmHg  Pulse 79  Temp(Src) 99 F (37.2 C) (Oral)  Resp 18  Ht 5\' 3"  (1.6 m)  Wt 100.4 kg (221 lb 5.5 oz)  BMI 39.22 kg/m2  SpO2 95%  Physical Exam  Abdominal: Soft. Bowel sounds are normal. There is tenderness.  Site of chole drain is NT No bleeding no hematoma Clean and dry Output 210 cc yesterday 50 cc in bag today--bilious Afeb; VSS Wbc 16.7 (24)     Imaging: Dg Chest 2 View  04/25/2015   CLINICAL DATA:  Recent tripping injury with persistence shortness of Breath  EXAM: CHEST  2 VIEW  COMPARISON:  01/08/2014  FINDINGS: Cardiac shadow is stable. The lungs are clear without evidence of pneumothorax. No focal infiltrate is seen. No acute bony abnormality is noted. No pneumothorax is noted. Chronic degenerative changes of the acromioclavicular joints are seen.  IMPRESSION: No acute abnormality noted.   Electronically Signed   By: Inez Catalina M.D.   On: 04/25/2015 16:29   Ct Abdomen Pelvis W Contrast  04/25/2015   CLINICAL DATA:  Right side abdominal pain for 3 days. Nausea, vomiting.  EXAM: CT ABDOMEN AND PELVIS WITH CONTRAST  TECHNIQUE: Multidetector CT imaging of the abdomen and pelvis was performed using the standard protocol following bolus administration of intravenous contrast.  CONTRAST:  145mL OMNIPAQUE IOHEXOL 300 MG/ML  SOLN  COMPARISON:  None.  FINDINGS: Lower chest: Lung bases are clear. No effusions. Heart is normal size.  Hepatobiliary: There is gallbladder wall thickening and pericholecystic fluid compatible with acute cholecystitis. At least 2 gallstones within the gallbladder neck, the  largest 10 mm. Common bile duct is dilated, measuring 10 mm. No visible distal ductal stone. Mild intrahepatic biliary ductal dilatation. No focal hepatic abnormality.  Pancreas: Fatty replacement without focal mass.  Spleen: No focal abnormality.  Normal size.  Adrenals/Urinary Tract: Fullness of the adrenal glands bilaterally compatible with hyperplasia. No focal renal abnormality or hydronephrosis. Urinary bladder is decompressed, grossly unremarkable.  Stomach/Bowel: Sigmoid diverticulosis. No active diverticulitis. Stomach and small bowel are unremarkable.  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 No retroperitoneal or mesenteric adenopathy.  Reproductive: Prior and hysterectomy.  No adnexal masses.  Other: No free fluid or free air.  Musculoskeletal: No focal bone lesion or acute bony abnormality.  IMPRESSION: At least 2 gallstones lodged within the gallbladder neck. Marked gallbladder wall thickening and pericholecystic fluid compatible with acute cholecystitis.  Biliary ductal dilatation also noted.  No visible ductal stones, but a distal CBD stone cannot be excluded.   Electronically Signed   By: Rolm Baptise M.D.   On: 04/25/2015 18:19   Ir Perc Cholecystostomy  04/26/2015   CLINICAL DATA:  74 year old with acute cholecystitis and not a good surgical candidate at this time.  EXAM: ULTRASOUND AND FLUOROSCOPIC GUIDED CHOLECYSTOSTOMY TUBE PLACEMENT  Physician: Stephan Minister. Henn, MD  FLUOROSCOPY TIME:  1 minutes and 30 seconds.  46.31 mGy  MEDICATIONS: 1.5 mg Versed, 75 mcg fentanyl. A radiology nurse monitored the patient for moderate sedation.  ANESTHESIA/SEDATION: Moderate sedation time: 15 minutes  PROCEDURE: The procedure was explained to the patient. The risks and benefits of the procedure were discussed and the patient's questions were addressed. Informed consent was obtained from the patient. Patient was placed supine on the  interventional table. The right upper abdomen was evaluated with ultrasound. The right upper abdomen was prepped and draped in sterile fashion. Maximal barrier sterile technique was utilized including caps, mask, sterile gowns, sterile gloves, sterile drape, hand hygiene and skin antiseptic. 1% lidocaine was used for local anesthetic. A 21 gauge needle was directed into the gallbladder from a transhepatic approach. A 0.018 wire was advanced of the gallbladder with fluoroscopy. An Accustick dilator set was placed. A small amount of brown purulent fluid was aspirated. Tract was dilated over a J-wire. A 10.2 [place] multipurpose drain was advanced over the wire and reconstituted in the gallbladder. Approximately 90 mL of purulent biliary fluid was aspirated. Catheter was sutured to the skin and attached to gravity bag. Fluid was sent for culture. Fluoroscopic and ultrasound images were taken and saved for documentation.  FINDINGS: Gallbladder was moderately distended with a small amount of wall thickening and/or pericholecystic fluid. Tube was successful placed in the gallbladder and the gallbladder was decompressed at the end of the procedure.  Estimated blood loss: Minimal  COMPLICATIONS: None  IMPRESSION: Successful percutaneous cholecystostomy tube placement.   Electronically Signed   By: Markus Daft M.D.   On: 04/26/2015 17:38    Labs:  CBC:  Recent Labs  01/22/15 0954 04/25/15 1506 04/26/15 0541 04/27/15 0617  WBC 8.6 18.6* 24.0* 16.7*  HGB 13.2 15.1* 13.0 11.4*  HCT 39.8 44.1 38.4 33.9*  PLT 212 292 264 237    COAGS:  Recent Labs  04/26/15 1110  INR 1.63*  APTT 42*    BMP:  Recent Labs  04/25/15 1506 04/26/15 04/26/15 0541 04/27/15 0617  NA 136 135 136 135  K 3.0* 3.0* 3.5 3.0*  CL 105 104 107 106  CO2 18* 20* 21* 22  GLUCOSE 165* 106* 85 111*  BUN 12 14 17  24*  CALCIUM 9.9 9.0 8.7* 8.5*  CREATININE 0.81 0.70 0.79 0.86  GFRNONAA >60 >60 >60 >60  GFRAA >60 >60 >60 >60     LIVER FUNCTION TESTS:  Recent Labs  12/12/14 1228 04/25/15 1506 04/27/15 0617  BILITOT 0.2 0.4 0.4  AST 16 25 16   ALT 15 15 13*  ALKPHOS 102 73 79  PROT 6.3 7.8 5.7*  ALBUMIN 3.9 3.6 2.3*    Assessment and Plan:  Perc chole drain intact Plan per CCS  Signed: Shaletta Hinostroza A 04/27/2015, 8:59 AM   I spent a total of 15 Minutes in face to face in clinical consultation/evaluation, greater than 50% of which was counseling/coordinating care for perc chole drain

## 2015-04-28 DIAGNOSIS — F418 Other specified anxiety disorders: Secondary | ICD-10-CM

## 2015-04-28 DIAGNOSIS — Z9049 Acquired absence of other specified parts of digestive tract: Secondary | ICD-10-CM

## 2015-04-28 DIAGNOSIS — I4892 Unspecified atrial flutter: Secondary | ICD-10-CM

## 2015-04-28 DIAGNOSIS — G8929 Other chronic pain: Secondary | ICD-10-CM

## 2015-04-28 LAB — CBC
HEMATOCRIT: 35 % — AB (ref 36.0–46.0)
Hemoglobin: 11.7 g/dL — ABNORMAL LOW (ref 12.0–15.0)
MCH: 32.5 pg (ref 26.0–34.0)
MCHC: 33.4 g/dL (ref 30.0–36.0)
MCV: 97.2 fL (ref 78.0–100.0)
Platelets: 273 10*3/uL (ref 150–400)
RBC: 3.6 MIL/uL — ABNORMAL LOW (ref 3.87–5.11)
RDW: 13.4 % (ref 11.5–15.5)
WBC: 15.8 10*3/uL — ABNORMAL HIGH (ref 4.0–10.5)

## 2015-04-28 LAB — BASIC METABOLIC PANEL
Anion gap: 10 (ref 5–15)
BUN: 19 mg/dL (ref 6–20)
CO2: 21 mmol/L — ABNORMAL LOW (ref 22–32)
CREATININE: 0.64 mg/dL (ref 0.44–1.00)
Calcium: 9.1 mg/dL (ref 8.9–10.3)
Chloride: 107 mmol/L (ref 101–111)
GFR calc non Af Amer: >60 mL/min (ref >60)
Glucose, Bld: 173 mg/dL — ABNORMAL HIGH (ref 65–99)
POTASSIUM: 3.4 mmol/L — AB (ref 3.5–5.1)
SODIUM: 138 mmol/L (ref 135–145)

## 2015-04-28 LAB — GLUCOSE, CAPILLARY: Glucose-Capillary: 132 mg/dL — ABNORMAL HIGH (ref 65–99)

## 2015-04-28 LAB — MAGNESIUM: MAGNESIUM: 2.1 mg/dL (ref 1.7–2.4)

## 2015-04-28 MED ORDER — PANTOPRAZOLE SODIUM 20 MG PO TBEC
20.0000 mg | DELAYED_RELEASE_TABLET | Freq: Two times a day (BID) | ORAL | Status: DC
  Administered 2015-04-28 – 2015-04-29 (×2): 20 mg via ORAL
  Filled 2015-04-28 (×4): qty 1

## 2015-04-28 MED ORDER — POTASSIUM CHLORIDE CRYS ER 20 MEQ PO TBCR
40.0000 meq | EXTENDED_RELEASE_TABLET | Freq: Once | ORAL | Status: AC
  Administered 2015-04-28: 40 meq via ORAL
  Filled 2015-04-28: qty 2

## 2015-04-28 MED ORDER — FAMOTIDINE 40 MG PO TABS
40.0000 mg | ORAL_TABLET | Freq: Every day | ORAL | Status: DC
  Administered 2015-04-28 – 2015-05-01 (×4): 40 mg via ORAL
  Filled 2015-04-28 (×5): qty 1

## 2015-04-28 MED ORDER — HYDROCHLOROTHIAZIDE 25 MG PO TABS
12.5000 mg | ORAL_TABLET | Freq: Every day | ORAL | Status: DC
  Filled 2015-04-28: qty 0.5

## 2015-04-28 MED ORDER — HYDROCHLOROTHIAZIDE 12.5 MG PO CAPS
12.5000 mg | ORAL_CAPSULE | Freq: Every day | ORAL | Status: DC
  Administered 2015-04-28 – 2015-05-02 (×5): 12.5 mg via ORAL
  Filled 2015-04-28 (×5): qty 1

## 2015-04-28 NOTE — Progress Notes (Signed)
  Date: 04/28/2015  Patient name: Jennifer [name]  Medical record number: [id number]  Date of birth: 1941/02/23   This patient's plan of care was discussed with the house staff. Please see Dr. Serita Grit note for complete details. I concur with his findings.  One update, her blood pressure is on the rise, SBP 160s-180s, would start adding back her home medications one at a time and monitor blood pressure closely.  Drainage is slowing today from percutaneous cholecystotomy.     Sid Falcon, MD 04/28/2015, 1:27 PM

## 2015-04-28 NOTE — Progress Notes (Addendum)
Subjective: This morning, she reported no complaints and felt like her pain was resolving. Her daughter was at bedside and reported that her mother had a bowel movement last night. Review of telemetry this morning was notable for some limited episodes of PGRT.   Objective: Vital signs in last 24 hours: Filed Vitals:   04/27/15 2039 04/28/15 0206 04/28/15 0549 04/28/15 0918  BP: 186/94 162/84 179/89   Pulse: 81 76 84   Temp: 98.2 F (36.8 C) 98 F (36.7 C) 97.7 F (36.5 C)   TempSrc: Oral Oral Oral   Resp: 22 20 20    Height:      Weight:   220 lb 1.9 oz (99.845 kg)   SpO2: 94% 96% 98% 98%   Weight change: -1 lb 3.6 oz (-0.555 kg)  Intake/Output Summary (Last 24 hours) at 04/28/15 1112 Last data filed at 04/28/15 0524  Gross per 24 hour  Intake   1190 ml  Output     40 ml  Net   1150 ml   Physical Exam: General: Elderly Caucasian female, resting in bed, NAD HEENT: PERRL, EOMI, no scleral icterus, oropharynx clear Cardiac: RRR, no rubs, murmurs or gallops Pulm: clear to auscultation bilaterally, no wheezes, rales, or rhonchi Abd: Incision site covered with bandage that is clean, dry, intact, BS present Ext: warm and well perfused, no pedal edema Neuro: responds to questions appropriately; moving all extremities freely   Lab Results: Basic Metabolic Panel:  Recent Labs Lab 04/26/15 0541 04/27/15 0617 04/28/15 0325  NA 136 135 138  K 3.5 3.0* 3.4*  CL 107 106 107  CO2 21* 22 21*  GLUCOSE 85 111* 173*  BUN 17 24* 19  CREATININE 0.79 0.86 0.64  CALCIUM 8.7* 8.5* 9.1  MG 1.9 1.9 2.1  PHOS 3.2  --   --    Liver Function Tests:  Recent Labs Lab 04/25/15 1506 04/27/15 0617  AST 25 16  ALT 15 13*  ALKPHOS 73 79  BILITOT 0.4 0.4  PROT 7.8 5.7*  ALBUMIN 3.6 2.3*    Recent Labs Lab 04/25/15 1506  LIPASE 16*   CBC:  Recent Labs Lab 04/25/15 1506 04/26/15 0541 04/27/15 0617 04/28/15 0325  WBC 18.6* 24.0* 16.7* 15.8*  NEUTROABS 16.7* 20.4*  --    --   HGB 15.1* 13.0 11.4* 11.7*  HCT 44.1 38.4 33.9* 35.0*  MCV 98.0 98.2 97.7 97.2  PLT 292 264 237 273   Cardiac Enzymes:  Recent Labs Lab 04/25/15 2347 04/26/15 0541 04/26/15 1110  TROPONINI 0.03 0.03 <0.03   CBG:  Recent Labs Lab 04/26/15 0557 04/28/15 0553  GLUCAP 103* 132*   Urinalysis:  Recent Labs Lab 04/25/15 1656  COLORURINE AMBER*  LABSPEC 1.023  PHURINE 7.5  GLUCOSEU NEGATIVE  HGBUR NEGATIVE  BILIRUBINUR SMALL*  KETONESUR NEGATIVE  PROTEINUR 30*  UROBILINOGEN 1.0  NITRITE NEGATIVE  LEUKOCYTESUR SMALL*   Studies/Results: Ir Perc Cholecystostomy  04/26/2015   CLINICAL DATA:  74 year old with acute cholecystitis and not a good surgical candidate at this time.  EXAM: ULTRASOUND AND FLUOROSCOPIC GUIDED CHOLECYSTOSTOMY TUBE PLACEMENT  Physician: Stephan Minister. Henn, MD  FLUOROSCOPY TIME:  1 minutes and 30 seconds.  46.31 mGy  MEDICATIONS: 1.5 mg Versed, 75 mcg fentanyl. A radiology nurse monitored the patient for moderate sedation.  ANESTHESIA/SEDATION: Moderate sedation time: 15 minutes  PROCEDURE: The procedure was explained to the patient. The risks and benefits of the procedure were discussed and the patient's questions were addressed. Informed consent was obtained  from the patient. Patient was placed supine on the interventional table. The right upper abdomen was evaluated with ultrasound. The right upper abdomen was prepped and draped in sterile fashion. Maximal barrier sterile technique was utilized including caps, mask, sterile gowns, sterile gloves, sterile drape, hand hygiene and skin antiseptic. 1% lidocaine was used for local anesthetic. A 21 gauge needle was directed into the gallbladder from a transhepatic approach. A 0.018 wire was advanced of the gallbladder with fluoroscopy. An Accustick dilator set was placed. A small amount of brown purulent fluid was aspirated. Tract was dilated over a J-wire. A 10.2 [place] multipurpose drain was advanced over the wire and  reconstituted in the gallbladder. Approximately 90 mL of purulent biliary fluid was aspirated. Catheter was sutured to the skin and attached to gravity bag. Fluid was sent for culture. Fluoroscopic and ultrasound images were taken and saved for documentation.  FINDINGS: Gallbladder was moderately distended with a small amount of wall thickening and/or pericholecystic fluid. Tube was successful placed in the gallbladder and the gallbladder was decompressed at the end of the procedure.  Estimated blood loss: Minimal  COMPLICATIONS: None  IMPRESSION: Successful percutaneous cholecystostomy tube placement.   Electronically Signed   By: Markus Daft M.D.   On: 04/26/2015 17:38   Medications: I have reviewed the patient's current medications. Scheduled Meds: . famotidine  40 mg Oral QHS  . ketorolac  15 mg Intravenous 4 times per day  . mometasone-formoterol  2 puff Inhalation BID  . pantoprazole  20 mg Oral BID AC  . piperacillin-tazobactam (ZOSYN)  IV  3.375 g Intravenous 3 times per day  . sertraline  100 mg Oral BID  . tiotropium  18 mcg Inhalation Daily  . topiramate  50 mg Oral BID   Continuous Infusions:   PRN Meds:.acetaminophen **OR** acetaminophen, albuterol, ALPRAZolam, morphine injection, ondansetron **OR** ondansetron (ZOFRAN) IV, polyvinyl alcohol   Assessment/Plan: Ms. Sebree is a 74 year old female with HTN, chronic dCHF, GERD, depression, anxiety, and chronic pain hospitalized for acute cholecystitis status post percutaneous cholecystostomy day 2.    Acute cholecystitis s/p percutaneous cholecystostomy: Continues to show improvement with improving leukocytosis. Blood cultures still negative to date, biliary fluid culture shows gram positive rods and gram negative rods. Patient to keep cholecystostomy tube for 6 weeks, then have cholecystectomy per surgery notes.  -Continue morphine 2 mg q4h prn + Toradol 50 mg q6h prn  -Continue Zofran 4 mg q6h prn -Continue Protonix 40 mg -Continue  Zosyn (day 4/7) -Follow-up blood, biliary fluid cultures -Advancing diet as tolerated -Consult PT/OT  Atrial Flutter w/ Aberrancy: Telemetry review as noted above. EF 60-65% with grade 1 diastolic dysfunction as noted on echo on this admission, stable from December 2011. -Continue telemetry  HTN: Normotensive.  -Continue to hold home meds for now  Chronic dCHF: Echo findings as noted above. Her medications include HCTZ and ASA 81 mg. -Holding for now  COPD: Stable.  -Continue Dulera, Spiriva  OSA: Stable. -Continue CPAP qhs  Depression/Anxiety/Chronic Pain: Stable. Still some concern for in-hospital delirium given age, pain, and narcotic medications.  -Continue Zoloft 100 mg bid -Continue Topamax 50 mg bid -Continue Xanax 0.5 mg qhs for now (takes differently at home). Can change dose to home does if mental status allows.   #FEN:  -Diet: Clears, advancing as tolerated -Give K-Dur 40 mEq for potassium 3.4  #DVT prophylaxis: SCDs  #CODE STATUS: FULL CODE   Dispo: Disposition is deferred at this time, awaiting improvement of current medical problems.  The patient does have a current PCP Bertha Stakes, MD) and does need an [facility] hospital follow-up appointment after discharge.  The patient does have transportation limitations that hinder transportation to clinic appointments.  .Services Needed at time of discharge: Y = Yes, Blank = No PT:   OT:   RN:   Equipment:   Other:     LOS: 3 days   Riccardo Dubin, MD 04/28/2015, 11:12 AM

## 2015-04-28 NOTE — Progress Notes (Signed)
Name: Jennifer [name] MRN: [id number] Date: 04/28/2015 LOS: 3  Subjective: Jennifer [name] looks a lot better today. She is alert, in less pain and is able to converse with the team. She had a short episode of arrhythmia upon review of her telemetry. 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 They do not suggest any changes to her work up or medications.   Objective: Vital signs in last 24 hours: Filed Vitals:   04/27/15 2039 04/28/15 0206 04/28/15 0549 04/28/15 0918  BP: 186/94 162/84 179/89   Pulse: 81 76 84   Temp: 98.2 F (36.8 C) 98 F (36.7 C) 97.7 F (36.5 C)   TempSrc: Oral Oral Oral   Resp: 22 20 20    Height:      Weight:   220 lb 1.9 oz (99.845 kg)   SpO2: 94% 96% 98% 98%    Weight change: -1 lb 3.6 oz (-0.555 kg) Filed Weights   04/26/15 0500 04/27/15 0514 04/28/15 0549  Weight: 230 lb 1.6 oz (104.373 kg) 221 lb 5.5 oz (100.4 kg) 220 lb 1.9 oz (99.845 kg)    I/O: I: 1280 mL PO, Medlocked O: 80 mL fluid from drain,      1 unmeasured urine, 0 BM  Physical Exam General appearance: Jennifer [name] was alert and in a lot less distress this morning. She was able to move on her own for PE w/out pain. Lungs: clear to auscultation bilaterally Heart: regular rate and rhythm and S1, S2 normal Abdomen: normal bowel sounds, RUQ markedly less tender, less distended. Tube site: Drain is intact, Area around drain is clean and well bandaged. Extremities: 2+ bilateral pitting edema (edema has improved since yesterday)   Lab Results:  Glucose-capillary: 132 (H) Mg: 2.1 CMP Latest Ref Rng 04/28/2015 04/27/2015 04/26/2015  Glucose 65 - 99 mg/dL 173(H) 111(H) 85  BUN 6 - 20 mg/dL 19 24(H) 17  Creatinine 0.44 - 1.00 mg/dL 0.64 0.86 0.79  Sodium 135 - 145 mmol/L 138 135 136  Potassium 3.5 - 5.1 mmol/L 3.4(L) 3.0(L) 3.5  Chloride 101 - 111 mmol/L 107 106 107  CO2 22 - 32 mmol/L 21(L) 22 21(L)  Calcium 8.9 - 10.3 mg/dL 9.1  8.5(L) 8.7(L)  Total Protein 6.5 - 8.1 g/dL - 5.7(L) -  Total Bilirubin 0.3 - 1.2 mg/dL - 0.4 -  Alkaline Phos 38 - 126 U/L - 79 -  AST 15 - 41 U/L - 16 -  ALT 14 - 54 U/L - 13(L) -    CBC Latest Ref Rng 04/28/2015 04/27/2015 04/26/2015  WBC 4.0 - 10.5 K/uL 15.8(H) 16.7(H) 24.0(H)  Hemoglobin 12.0 - 15.0 g/dL 11.7(L) 11.4(L) 13.0  Hematocrit 36.0 - 46.0 % 35.0(L) 33.9(L) 38.4  Platelets 150 - 400 K/uL 273 237 264    Micro Results: Body Fluid Culture: Gallbladder : Received: 05/21 - Gram stain: Abundant WBC present, predominantly PMN  - Abundant Gram + rods  - Moderate Gram - rods - Cultures pending  Blood culture x2: Received: 05/21 - Pending: no growth to date  Studies/Results: No new studies  Medications: I have reviewed the patient's current medications. Scheduled Meds: . famotidine  40 mg Oral QHS  . ketorolac  15 mg Intravenous 4 times per day  . mometasone-formoterol  2 puff Inhalation BID  . pantoprazole  20 mg Oral BID AC  . piperacillin-tazobactam (ZOSYN)  IV  3.375 g Intravenous 3 times per day  . sertraline  100 mg Oral BID  .  tiotropium  18 mcg Inhalation Daily  . topiramate  50 mg Oral BID   PRN Meds: .acetaminophen **OR** acetaminophen, albuterol, ALPRAZolam, morphine injection, ondansetron **OR** ondansetron (ZOFRAN) IV, polyvinyl alcohol  Assessment/Plan:  Sepsis secondary to acute cholecystitis: Jennifer [name] has been afebrile and continue to have decreasing WBC (24 -> 16.7 -> 15.8 today). Site of the percutaneous cholecystostomy was clean with no leakage from site. The tube drained 80 mL yesterday, totaling to 290 mL since placement. Blood cultures and body fluid cultures are still pending. Pharmacy was consulted to review patients current antibiotic regimen for the growth on body fluid gram stain and recommend to continue zosyn. She is on day 4/7 of zosyn.  -Continue Zosyn 3.375 gm IV q8h (4hour infusion), per pharmacy -clear liquid diet -Medlocked -F/u  blood cultures x2 (05/21) -F/u body fluid cultures (05/21) -morphine 2 mg q4hprn + toradol 50 mg q6h prn -ketorolac 15 mg iv q6h -zofran 4 mg po or iv q6hprn  -tylenol 650 mg po or pr q6hprn -protonix 40 mg iv qhs  Atrial flutter with aberrancy: Jennifer [name] had another episode of abnormal arrhthymias recorded on her telemetry from last night. Cardiology reviewed the results with the team and classified the arrhythmia as a persistent junctional reciprocating tachycardia. Cardiology was not concerned and no changes were made to her plan of care. An Echo on 05/21 showed normal cavity size and shape of the left ventricle, but mild left atrial dilation and dilated ascending aorta.  -F/u cardiology recommendations from echo.  COPD: Patient is having shortness of breath, but reports it is not different from baseline.  -Dulera1 puff bid, spiriva 18 mch inh daily, and albuterol 1-2 puff q6hprn  Chronic pain: At home she takes norco 7.5-325 1 tab qidprn, amitriptyline 50 mg qhs, carisoprodol 350 mg dailyprn. -Zoloft 100 mg bid, topamax 50 mg bid and xanax 0.5 mg qhs  OSA: Patient did no use CPAP last night due to nausea. Daughter also states that the mask does not fit her face. On CPAP qhs -Continue CPAP qhs  HTN: BP 100s-130s/70s-80s since admission.  - Continue to hold home medications of HCTZ 12.5 mg daily, nifedipine 90 mg daily, ASA 81 mg daily.  Chronic Diastolic CHF: No acute problems during admission. Last TTE 11/2010 w EF 94-70%, grade 1 diastolic dysfunction. At home she is on ASA 81 mg daily. - Continue to hold ASA 81 mg daily  FEN F: Medlocked E: Hypokalemia: 3.4 today despite receiving two doses of 40 mEq PO KCl yesterday for a K 3.0. - Will receive 1x PO KCl 40 mEq  - Will recheck BMP tomorrow.  N: Clear liquid (possibly advance, surgery team is following)  VTE prophylaxis - SCDs preoperatively  Code: Full  Dispo: Disposition is deferred at this time.   Jennifer [name] is  currently established with Axel Filler, MD as her PCP and will need to schedule a follow-up apt w/ him in 7-10 days after discharge.    .Services Needed at time of discharge: Y = Yes, Blank = No PT:   OT:   RN:   Equipment:   Other:     LOS: 3 days   Carla Drape, Med Student 04/28/2015, 12:06 PM

## 2015-04-28 NOTE — Progress Notes (Signed)
Utilization review complete. Sukhraj Esquivias RN CCM Case Mgmt phone [phone] 

## 2015-04-28 NOTE — Progress Notes (Signed)
ANTIBIOTIC CONSULT NOTE  Pharmacy Consult for Zosyn Indication: : Intra-abdominal Infection, Sepsis secondary to acute cholecystitis  Allergies  Allergen Reactions  . Flonase [Fluticasone] Cough  . Lisinopril Other (See Comments)    Cough  . Tetracycline     REACTION: stomach upset    Vital Signs: Temp: 97.7 F (36.5 C) (05/23 0549) Temp Source: Oral (05/23 0549) BP: 179/89 mmHg (05/23 0549) Pulse Rate: 84 (05/23 0549) Intake/Output from previous day: 05/22 0701 - 05/23 0700 In: 1430 [P.O.:1280; IV Piggyback:150] Out: 80 [Drains:80] Intake/Output from this shift:    Labs:  Recent Labs  04/26/15 0541 04/27/15 0617 04/28/15 0325  WBC 24.0* 16.7* 15.8*  HGB 13.0 11.4* 11.7*  PLT 264 237 273  CREATININE 0.79 0.86 0.64   Estimated Creatinine Clearance: 67.4 mL/min (by C-G formula based on Cr of 0.64). No results for input(s): VANCOTROUGH, VANCOPEAK, VANCORANDOM, GENTTROUGH, GENTPEAK, GENTRANDOM, TOBRATROUGH, TOBRAPEAK, TOBRARND, AMIKACINPEAK, AMIKACINTROU, AMIKACIN in the last 72 hours.   Microbiology: Recent Results (from the past 720 hour(s))  Urine culture     Status: None   Collection Time: 04/25/15  4:56 PM  Result Value Ref Range Status   Specimen Description URINE, RANDOM  Final   Special Requests NONE  Final   Colony Count NO GROWTH Performed at [facility]   Final   Culture NO GROWTH Performed at [facility]   Final   Report Status 04/27/2015 FINAL  Final  Culture, blood (routine x 2)     Status: None (Preliminary result)   Collection Time: 04/26/15 12:00 AM  Result Value Ref Range Status   Specimen Description BLOOD LEFT ANTECUBITAL  Final   Special Requests BOTTLES DRAWN AEROBIC AND ANAEROBIC 10CC EA  Final   Culture   Final           BLOOD CULTURE RECEIVED NO GROWTH TO DATE CULTURE WILL BE HELD FOR 5 DAYS BEFORE ISSUING A FINAL NEGATIVE REPORT Performed at [facility]    Report Status PENDING  Incomplete  Culture,  blood (routine x 2)     Status: None (Preliminary result)   Collection Time: 04/26/15 12:10 AM  Result Value Ref Range Status   Specimen Description BLOOD LEFT HAND  Final   Special Requests BOTTLES DRAWN AEROBIC ONLY 8CC  Final   Culture   Final           BLOOD CULTURE RECEIVED NO GROWTH TO DATE CULTURE WILL BE HELD FOR 5 DAYS BEFORE ISSUING A FINAL NEGATIVE REPORT Performed at [facility]    Report Status PENDING  Incomplete  Body fluid culture     Status: None (Preliminary result)   Collection Time: 04/26/15  6:13 PM  Result Value Ref Range Status   Specimen Description GALL BLADDER  Final   Special Requests NONE  Final   Gram Stain   Final    ABUNDANT WBC PRESENT, PREDOMINANTLY PMN ABUNDANT GRAM POSITIVE RODS MODERATE GRAM NEGATIVE RODS Gram Stain Report Called to,Read Back By and Verified With: Gram Stain Report Called to,Read Back By and Verified With: Elie Goody RN on 04/27/15 at 03:10 by Rise Mu Performed at [facility]    Culture   Final    Culture reincubated for better growth Performed at [facility]    Report Status PENDING  Incomplete      Medications:  Prescriptions prior to admission  Medication Sig Dispense Refill Last Dose  . albuterol (PROAIR HFA) 108 (90 BASE) MCG/ACT inhaler INHALE 2  PUFFS BY MOUTH INTO LUNGS FOUR TIMES DAILY AS NEEDED (Patient taking differently: Inhale 1-2 puffs into the lungs every 6 (six) hours as needed for wheezing or shortness of breath. ) 3 Inhaler 6 Past Week at Unknown time  . ALPRAZolam (XANAX) 0.5 MG tablet Take 1-1.5 tablets (0.5-0.75 mg total) by mouth 3 (three) times daily as needed for anxiety. (Patient taking differently: Take 0.5-1 mg by mouth at bedtime as needed and may repeat dose one time if needed for anxiety. Take 1 mg daily at bedtime and take 0.5 mg as needed throughout the day for anxiety) 120 tablet 3 04/23/2015 at Unknown time  . amitriptyline (ELAVIL) 50 MG tablet Take 1 tablet (50 mg  total) by mouth at bedtime. 30 tablet 3 04/23/2015  . aspirin 81 MG EC tablet Take 81 mg by mouth daily.     04/23/2015  . Calcium Carb-Cholecalciferol 600-800 MG-UNIT TABS Take 1 tablet by mouth 2 (two) times daily.   04/23/2015  . carisoprodol (SOMA) 350 MG tablet Take 1 tablet (350 mg total) by mouth daily as needed for muscle spasms. (Patient taking differently: Take 350 mg by mouth daily. ) 30 tablet 3 04/23/2015  . Cholecalciferol (VITAMIN D3) 1000 UNITS CAPS Take 1 capsule (1,000 Units total) by mouth daily. 30 capsule 2 04/23/2015  . CINNAMON PO Take by mouth 2 (two) times daily.   04/23/2015  . dextromethorphan-guaiFENesin (MUCINEX DM) 30-600 MG per 12 hr tablet Take 1 tablet by mouth 2 (two) times daily.   04/23/2015  . docusate sodium (COLACE) 100 MG capsule Take 100 mg by mouth daily as needed for mild constipation or moderate constipation.   1 month ago  . Fluticasone-Salmeterol (ADVAIR DISKUS) 250-50 MCG/DOSE AEPB Inhale 1 puff into the lungs 2 (two) times daily. 180 each 4 04/24/2015 at Unknown time  . hydrochlorothiazide (HYDRODIURIL) 25 MG tablet Take 0.5 tablets (12.5 mg total) by mouth daily. 45 tablet 3 04/23/2015  . HYDROcodone-acetaminophen (NORCO) 7.5-325 MG per tablet Take 1 tablet by mouth 4 (four) times daily as needed for moderate pain or severe pain. 120 tablet 0 04/24/2015 at Unknown time  . hydroxypropyl methylcellulose (ISOPTO TEARS) 2.5 % ophthalmic solution Place 2 drops into both eyes 3 (three) times daily as needed. Dry eye   Past Week at Unknown time  . ibuprofen (ADVIL,MOTRIN) 200 MG tablet Take 200 mg by mouth 4 (four) times daily as needed for moderate pain. For pain    04/24/2015 at Unknown time  . meclizine (ANTIVERT) 12.5 MG tablet Take 1 tablet (12.5 mg total) by mouth 2 (two) times daily as needed for dizziness. 180 tablet 0 04/24/2015 at Unknown time  . Multiple Vitamin (MULTIVITAMIN) tablet Take 1 tablet by mouth daily.   04/23/2015  . NIFEdipine (ADALAT CC) 90 MG 24  hr tablet Take 1 tablet (90 mg total) by mouth daily. 90 tablet 2 04/23/2015  . omeprazole (PRILOSEC) 20 MG capsule Take 1 capsule (20 mg total) by mouth 2 (two) times daily. 180 capsule 0 04/23/2015  . potassium chloride (K-DUR) 10 MEQ tablet Take 2 tablets (20 mEq total) by mouth 2 (two) times daily. 360 tablet 1 04/23/2015  . ranitidine (ZANTAC) 150 MG tablet Take 1 tablet (150 mg total) by mouth at bedtime. 90 tablet 1 04/23/2015  . Red Yeast Rice Extract (RED YEAST RICE PO) Take by mouth 2 (two) times daily.   04/23/2015  . sertraline (ZOLOFT) 100 MG tablet Take 1 tablet (100 mg total) by mouth 2 (two)  times daily. 180 tablet 1 04/23/2015  . tiotropium (SPIRIVA HANDIHALER) 18 MCG inhalation capsule Place 1 capsule (18 mcg total) into inhaler and inhale daily. 90 capsule 4 04/24/2015 at Unknown time  . topiramate (TOPAMAX) 50 MG tablet Take 1 tablet (50 mg total) by mouth 2 (two) times daily. 60 tablet 2 04/23/2015  . triamcinolone cream (KENALOG) 0.1 % APPLY TOPICALLY TO THE AFFECTED AREA OF HAND TWICE DAILY AS NEEDED (Patient taking differently: APPLY TOPICALLY TO THE AFFECTED AREA OF HAND TWICE DAILY AS NEEDED FOR HAND) 45 g 1 over 30 days   Scheduled:  . famotidine  40 mg Oral QHS  . ketorolac  15 mg Intravenous 4 times per day  . mometasone-formoterol  2 puff Inhalation BID  . pantoprazole  20 mg Oral BID AC  . piperacillin-tazobactam (ZOSYN)  IV  3.375 g Intravenous 3 times per day  . sertraline  100 mg Oral BID  . tiotropium  18 mcg Inhalation Daily  . topiramate  50 mg Oral BID   Assessment: 74 y.o female with an intra-abdominal Infection, sepsis due to acute cholecystitis. Afeb, WBC trending down  Goal of Therapy:  Appropriate antibiotic dosing for patient's renal function and treat infection  Plan:  Zosyn 3.375 gm IV q8h (4hour infusion) Monitor clinical status, renal function, culture results daily. Thanks for allowing pharmacy to be a part of this patient's care.  Excell Seltzer,  PharmD Clinical Pharmacist, [phone]

## 2015-04-28 NOTE — Progress Notes (Signed)
Patient ID: Jennifer [name], female   DOB: [birth year], 74 y.o.   MRN: [id number]     [facility]      [address] [place], [place] [address]    Phone: [phone] FAX: [phone]     Subjective: VSS.  WBC down.  Have a BM overnight.  Has hiccups, heart burn, home meds not right.   Pain is much better.  Objective:  Vital signs:  Filed Vitals:   04/27/15 1503 04/27/15 2039 04/28/15 0206 04/28/15 0549  BP: 173/88 186/94 162/84 179/89  Pulse: 78 81 76 84  Temp: 98.2 F (36.8 C) 98.2 F (36.8 C) 98 F (36.7 C) 97.7 F (36.5 C)  TempSrc: Oral Oral Oral Oral  Resp: $Remo'20 22 20 20  'MTURy$ Height:      Weight:    99.845 kg (220 lb 1.9 oz)  SpO2: 97% 94% 96% 98%    Last BM Date: 04/25/15  Intake/Output   Yesterday:  05/22 0701 - 05/23 0700 In: 1430 [P.O.:1280; IV Piggyback:150] Out: 80 [Drains:80] This shift: I/O last 3 completed shifts: In: 2180 [P.O.:1880; IV Piggyback:300] Out: 290 [Drains:290]     Physical Exam: General: Pt awake/alert/oriented x4 in no acute distress Abdomen: Soft. Nondistended. minimal TTP to RUQ.  Drain with bilious output. No evidence of peritonitis. No incarcerated hernias.    Problem List:   Principal Problem:   Acute cholecystitis Active Problems:   Hyperlipidemia   Depression   Obstructive sleep apnea   Meniere's disease   Essential hypertension   Diastolic heart failure   COPD with emphysema   Chronic low back pain   Fibromyalgia   Hypokalemia   Lactic acidosis   Paroxysmal ventricular tachycardia   Wide-complex tachycardia   Paroxysmal atrial fibrillation   Shortness of breath   Morbid obesity    Results:   Labs: Results for orders placed or performed during the hospital encounter of 04/25/15 (from the past 48 hour(s))  Troponin I (q 6hr x 3)     Status: None   Collection Time: 04/26/15 11:10 AM  Result Value Ref Range   Troponin I <0.03 <0.031 ng/mL    Comment:         NO INDICATION OF MYOCARDIAL INJURY.   APTT     Status: Abnormal   Collection Time: 04/26/15 11:10 AM  Result Value Ref Range   aPTT 42 (H) 24 - 37 seconds    Comment:        IF BASELINE aPTT IS ELEVATED, SUGGEST PATIENT RISK ASSESSMENT BE USED TO DETERMINE APPROPRIATE ANTICOAGULANT THERAPY.   Protime-INR     Status: Abnormal   Collection Time: 04/26/15 11:10 AM  Result Value Ref Range   Prothrombin Time 19.4 (H) 11.6 - 15.2 seconds   INR 1.63 (H) 0.00 - 1.49  Body fluid culture     Status: None (Preliminary result)   Collection Time: 04/26/15  6:13 PM  Result Value Ref Range   Specimen Description GALL BLADDER    Special Requests NONE    Gram Stain      ABUNDANT WBC PRESENT, PREDOMINANTLY PMN ABUNDANT GRAM POSITIVE RODS MODERATE GRAM NEGATIVE RODS Gram Stain Report Called to,Read Back By and Verified With: Gram Stain Report Called to,Read Back By and Verified With: Elie Goody RN on 04/27/15 at 03:10 by Rise Mu Performed at [place] reincubated for better growth Performed at [facility]  Report Status PENDING   CBC     Status: Abnormal   Collection Time: 04/27/15  6:17 AM  Result Value Ref Range   WBC 16.7 (H) 4.0 - 10.5 K/uL   RBC 3.47 (L) 3.87 - 5.11 MIL/uL   Hemoglobin 11.4 (L) 12.0 - 15.0 g/dL   HCT 33.9 (L) 36.0 - 46.0 %   MCV 97.7 78.0 - 100.0 fL   MCH 32.9 26.0 - 34.0 pg   MCHC 33.6 30.0 - 36.0 g/dL   RDW 13.5 11.5 - 15.5 %   Platelets 237 150 - 400 K/uL  Comprehensive metabolic panel     Status: Abnormal   Collection Time: 04/27/15  6:17 AM  Result Value Ref Range   Sodium 135 135 - 145 mmol/L   Potassium 3.0 (L) 3.5 - 5.1 mmol/L   Chloride 106 101 - 111 mmol/L   CO2 22 22 - 32 mmol/L   Glucose, Bld 111 (H) 65 - 99 mg/dL   BUN 24 (H) 6 - 20 mg/dL   Creatinine, Ser 0.86 0.44 - 1.00 mg/dL   Calcium 8.5 (L) 8.9 - 10.3 mg/dL   Total Protein 5.7 (L) 6.5 - 8.1 g/dL   Albumin 2.3 (L) 3.5 - 5.0 g/dL   AST 16  15 - 41 U/L   ALT 13 (L) 14 - 54 U/L   Alkaline Phosphatase 79 38 - 126 U/L   Total Bilirubin 0.4 0.3 - 1.2 mg/dL   GFR calc non Af Amer >60 >60 mL/min   GFR calc Af Amer >60 >60 mL/min    Comment: (NOTE) The eGFR has been calculated using the CKD EPI equation. This calculation has not been validated in all clinical situations. eGFR's persistently <60 mL/min signify possible Chronic Kidney Disease.    Anion gap 7 5 - 15  Magnesium     Status: None   Collection Time: 04/27/15  6:17 AM  Result Value Ref Range   Magnesium 1.9 1.7 - 2.4 mg/dL  Basic metabolic panel     Status: Abnormal   Collection Time: 04/28/15  3:25 AM  Result Value Ref Range   Sodium 138 135 - 145 mmol/L   Potassium 3.4 (L) 3.5 - 5.1 mmol/L   Chloride 107 101 - 111 mmol/L   CO2 21 (L) 22 - 32 mmol/L   Glucose, Bld 173 (H) 65 - 99 mg/dL   BUN 19 6 - 20 mg/dL   Creatinine, Ser 0.64 0.44 - 1.00 mg/dL   Calcium 9.1 8.9 - 10.3 mg/dL   GFR calc non Af Amer >60 >60 mL/min   GFR calc Af Amer >60 >60 mL/min    Comment: (NOTE) The eGFR has been calculated using the CKD EPI equation. This calculation has not been validated in all clinical situations. eGFR's persistently <60 mL/min signify possible Chronic Kidney Disease.    Anion gap 10 5 - 15  Magnesium     Status: None   Collection Time: 04/28/15  3:25 AM  Result Value Ref Range   Magnesium 2.1 1.7 - 2.4 mg/dL  CBC     Status: Abnormal   Collection Time: 04/28/15  3:25 AM  Result Value Ref Range   WBC 15.8 (H) 4.0 - 10.5 K/uL   RBC 3.60 (L) 3.87 - 5.11 MIL/uL   Hemoglobin 11.7 (L) 12.0 - 15.0 g/dL   HCT 35.0 (L) 36.0 - 46.0 %   MCV 97.2 78.0 - 100.0 fL   MCH 32.5 26.0 - 34.0 pg   MCHC 33.4  30.0 - 36.0 g/dL   RDW 13.4 11.5 - 15.5 %   Platelets 273 150 - 400 K/uL  Glucose, capillary     Status: Abnormal   Collection Time: 04/28/15  5:53 AM  Result Value Ref Range   Glucose-Capillary 132 (H) 65 - 99 mg/dL    Imaging / Studies: Ir Perc  Cholecystostomy  04/26/2015   CLINICAL DATA:  74 year old with acute cholecystitis and not a good surgical candidate at this time.  EXAM: ULTRASOUND AND FLUOROSCOPIC GUIDED CHOLECYSTOSTOMY TUBE PLACEMENT  Physician: Stephan Minister. Henn, MD  FLUOROSCOPY TIME:  1 minutes and 30 seconds.  46.31 mGy  MEDICATIONS: 1.5 mg Versed, 75 mcg fentanyl. A radiology nurse monitored the patient for moderate sedation.  ANESTHESIA/SEDATION: Moderate sedation time: 15 minutes  PROCEDURE: The procedure was explained to the patient. The risks and benefits of the procedure were discussed and the patient's questions were addressed. Informed consent was obtained from the patient. Patient was placed supine on the interventional table. The right upper abdomen was evaluated with ultrasound. The right upper abdomen was prepped and draped in sterile fashion. Maximal barrier sterile technique was utilized including caps, mask, sterile gowns, sterile gloves, sterile drape, hand hygiene and skin antiseptic. 1% lidocaine was used for local anesthetic. A 21 gauge needle was directed into the gallbladder from a transhepatic approach. A 0.018 wire was advanced of the gallbladder with fluoroscopy. An Accustick dilator set was placed. A small amount of brown purulent fluid was aspirated. Tract was dilated over a J-wire. A 10.2 [place] multipurpose drain was advanced over the wire and reconstituted in the gallbladder. Approximately 90 mL of purulent biliary fluid was aspirated. Catheter was sutured to the skin and attached to gravity bag. Fluid was sent for culture. Fluoroscopic and ultrasound images were taken and saved for documentation.  FINDINGS: Gallbladder was moderately distended with a small amount of wall thickening and/or pericholecystic fluid. Tube was successful placed in the gallbladder and the gallbladder was decompressed at the end of the procedure.  Estimated blood loss: Minimal  COMPLICATIONS: None  IMPRESSION: Successful percutaneous  cholecystostomy tube placement.   Electronically Signed   By: Markus Daft M.D.   On: 04/26/2015 17:38    Medications / Allergies:  Scheduled Meds: . famotidine  40 mg Oral QHS  . ketorolac  15 mg Intravenous 4 times per day  . mometasone-formoterol  2 puff Inhalation BID  . pantoprazole  20 mg Oral BID AC  . piperacillin-tazobactam (ZOSYN)  IV  3.375 g Intravenous 3 times per day  . sertraline  100 mg Oral BID  . tiotropium  18 mcg Inhalation Daily  . topiramate  50 mg Oral BID   Continuous Infusions:  PRN Meds:.acetaminophen **OR** acetaminophen, albuterol, ALPRAZolam, morphine injection, ondansetron **OR** ondansetron (ZOFRAN) IV, polyvinyl alcohol  Antibiotics: Anti-infectives    Start     Dose/Rate Route Frequency Ordered Stop   04/26/15 0400  piperacillin-tazobactam (ZOSYN) IVPB 3.375 g     3.375 g 12.5 mL/hr over 240 Minutes Intravenous 3 times per day 04/25/15 2346     04/25/15 2000  cefTRIAXone (ROCEPHIN) 1 g in dextrose 5 % 50 mL IVPB  Status:  Discontinued     1 g 100 mL/hr over 30 Minutes Intravenous  Once 04/25/15 1951 04/25/15 1956   04/25/15 2000  piperacillin-tazobactam (ZOSYN) IVPB 3.375 g     3.375 g 100 mL/hr over 30 Minutes Intravenous  Once 04/25/15 1956 04/25/15 2052        Assessment/Plan Sepsis  Acute cholecystitis-improving, s/p cholecystostomy tube, to remain for 6 weeks, then interval cholecystectomy. F/U with Dr. Hulen Skains.  Cards eval(moderate risk, no further eval) -PT eval ID-zosyn D#3/7 GPR, GNR on culture, follow FEN-advance to fulls  Erby Pian, [facility] Surgery Pager [phone](7A-4:30P) For consults and floor pages call [phone](7A-4:30P)  04/28/2015 8:40 AM

## 2015-04-29 LAB — CBC
HCT: 32.9 % — ABNORMAL LOW (ref 36.0–46.0)
Hemoglobin: 10.8 g/dL — ABNORMAL LOW (ref 12.0–15.0)
MCH: 32.8 pg (ref 26.0–34.0)
MCHC: 32.8 g/dL (ref 30.0–36.0)
MCV: 100 fL (ref 78.0–100.0)
PLATELETS: 239 10*3/uL (ref 150–400)
RBC: 3.29 MIL/uL — ABNORMAL LOW (ref 3.87–5.11)
RDW: 13.5 % (ref 11.5–15.5)
WBC: 12.4 10*3/uL — AB (ref 4.0–10.5)

## 2015-04-29 LAB — BASIC METABOLIC PANEL
ANION GAP: 7 (ref 5–15)
BUN: 18 mg/dL (ref 6–20)
CALCIUM: 9.2 mg/dL (ref 8.9–10.3)
CO2: 22 mmol/L (ref 22–32)
Chloride: 106 mmol/L (ref 101–111)
Creatinine, Ser: 0.6 mg/dL (ref 0.44–1.00)
GFR calc Af Amer: >60 mL/min (ref >60)
GFR calc non Af Amer: >60 mL/min (ref >60)
Glucose, Bld: 97 mg/dL (ref 65–99)
POTASSIUM: 4.3 mmol/L (ref 3.5–5.1)
SODIUM: 135 mmol/L (ref 135–145)

## 2015-04-29 MED ORDER — MECLIZINE HCL 12.5 MG PO TABS
12.5000 mg | ORAL_TABLET | Freq: Two times a day (BID) | ORAL | Status: DC | PRN
  Administered 2015-04-29 – 2015-05-01 (×2): 12.5 mg via ORAL
  Filled 2015-04-29 (×3): qty 1

## 2015-04-29 MED ORDER — HYDROCODONE-ACETAMINOPHEN 7.5-325 MG PO TABS
1.0000 | ORAL_TABLET | Freq: Four times a day (QID) | ORAL | Status: DC | PRN
  Administered 2015-05-01 – 2015-05-02 (×3): 1 via ORAL
  Filled 2015-04-29 (×3): qty 1

## 2015-04-29 MED ORDER — MORPHINE SULFATE 2 MG/ML IJ SOLN
1.0000 mg | INTRAMUSCULAR | Status: DC | PRN
  Administered 2015-05-01: 1 mg via INTRAVENOUS
  Filled 2015-04-29: qty 1

## 2015-04-29 MED ORDER — PANTOPRAZOLE SODIUM 20 MG PO TBEC
20.0000 mg | DELAYED_RELEASE_TABLET | Freq: Two times a day (BID) | ORAL | Status: DC
  Administered 2015-04-29 – 2015-05-02 (×6): 20 mg via ORAL
  Filled 2015-04-29 (×8): qty 1

## 2015-04-29 MED ORDER — MORPHINE SULFATE 2 MG/ML IJ SOLN: 2.0000 mg | INTRAMUSCULAR | Status: DC | PRN

## 2015-04-29 NOTE — Progress Notes (Signed)
After pt turning, states more pain to right side/burning pain which is new to patient. Patient still has sharp pain to right abdomen which she states is the same pain she has had. Site assessed and drain/stitches still in place. No leaking from site at this time. Pt re-assessed in 1 hr and burning gone. Pt given morphine when IV access obtained. Will continue to monitor. Ronnette Hila, RN

## 2015-04-29 NOTE — Evaluation (Signed)
Physical Therapy Evaluation Patient Details Name: Jennifer [name] MRN: [id number] DOB: May 05, 1941 Today's Date: 04/29/2015   History of Present Illness  74 year old woman who presents with a three-day history of increasing right-sided abdominal pain and intermittent diarrhea. She developed fever to over 103 and family brought her to the emergency department for further evaluation. CT scan showed cholelithiasis, marked gallbladder wall thickening with a. surrounding Gallbladder fluid collection consistent with acute cholecystitis. Antibiotics administered in the emergency department. Surgery consulted.  Clinical Impression  This was pts first time up.  She needed to have BM when she got up -she was dizzy and BSC brought to her.  Pt clammy but OK when on BSC.  She walked to recliner and left sitting up.  Her next time up she should do much better.  Pt would benefit from  [facility] services once home with her history of fall, bad knees, decreased mobility prior and difficulty getting in her home due to her steps.    Follow Up Recommendations Home health PT;Supervision - Intermittent    Equipment Recommendations  None recommended by PT    Recommendations for Other Services       Precautions / Restrictions Precautions Precautions: Fall Restrictions Weight Bearing Restrictions: No      Mobility  Bed Mobility Overal bed mobility: Needs Assistance Bed Mobility: Sidelying to Sit   Sidelying to sit: Min assist       General bed mobility comments: cueing for technque  Transfers Overall transfer level: Needs assistance Equipment used: Rolling walker (2 wheeled) Transfers: Sit to/from Stand Sit to Stand: Min assist         General transfer comment: Pt sat up and h ad a lot of belching and needed to have a BM. she didnt feel safe enough to walk to bathroom so BSC brought to her.  pt had BM and needed assist for wiping.  Pt clammy while sitting on BSC.  Pt then walked to recliner and lleft up  in chair.  she overall feels weak and unsteady today.  her pulse 84 and O2 sats 94% when clammy  Ambulation/Gait Ambulation/Gait assistance: Min assist Ambulation Distance (Feet): 10 Feet Assistive device: Rolling walker (2 wheeled)       General Gait Details: Pt impulsive - not steady on her feet today  Stairs            Wheelchair Mobility    Modified Rankin (Stroke Patients Only)       Balance Overall balance assessment:  (Pt has history of one fall when she tripped over something)                                           Pertinent Vitals/Pain Pain Assessment: 0-10 Pain Score: 8  Pain Location: abdomen    Home Living Family/patient expects to be discharged to:: Private residence Living Arrangements: Children Available Help at Discharge: Family Type of Home: House Home Access: Stairs to enter   [employer] of Steps: four steps with no rail OR one BIG step that pt holds to post and family pushes her up this step.  this was hard for her before surgery Home Layout: One level Home Equipment: Walker - 2 wheels;Cane - single point;Bedside commode Additional Comments: Pt said her pipes froze and she is now livign with her daughter and grandson.  they are renting and the landlord is selling the  house so they need to find new housing soon.    Prior Function Level of Independence: Independent with assistive device(s)         Comments: pt used cane all the time due to bad knees.  one fall in last 3 months - she tripped over something     Hand Dominance        Extremity/Trunk Assessment   Upper Extremity Assessment: Generalized weakness (pt reports she cant pick up any heavy objects)           Lower Extremity Assessment: Generalized weakness (pt has bad knees -  has to ride motorized cart at grocerty)         Communication   Communication: No difficulties  Cognition Arousal/Alertness: Awake/alert Behavior During Therapy:  WFL for tasks assessed/performed Overall Cognitive Status: Within Functional Limits for tasks assessed                      General Comments      Exercises        Assessment/Plan    PT Assessment Patient needs continued PT services  PT Diagnosis Generalized weakness;Difficulty walking;Acute pain   PT Problem List Decreased activity tolerance;Decreased mobility;Decreased safety awareness;Decreased knowledge of use of DME  PT Treatment Interventions Gait training;Functional mobility training;Therapeutic activities;Therapeutic exercise;Patient/family education;DME instruction   PT Goals (Current goals can be found in the Care Plan section) Acute Rehab PT Goals Patient Stated Goal: to get back home safely PT Goal Formulation: With patient Time For Goal Achievement: 05/13/15 Potential to Achieve Goals: Good    Frequency Min 3X/week   Barriers to discharge   Pts walker is at her old house - she will have to get family to go by and get it    Co-evaluation               End of Session Equipment Utilized During Treatment: Gait belt Activity Tolerance: Patient limited by fatigue Patient left: in chair;with call bell/phone within reach Nurse Communication: Mobility status         Time: 1420-1510 PT Time Calculation (min) (ACUTE ONLY): 50 min   Charges:   PT Evaluation $Initial PT Evaluation Tier I: 1 Procedure PT Treatments $Therapeutic Activity: 23-37 mins   PT G Codes:        Loyal Buba 04/29/2015, 3:25 PM 04/29/2015  Rande Lawman, PT

## 2015-04-29 NOTE — Progress Notes (Signed)
Name: Jennifer [name] MRN: [id number] Date: 04/29/2015 LOS: 4  Subjective: Jennifer [name] had an episode of burning RUQ abdominal pain last night. She said it felt like "a hot needle is poking at her" and that it was different than her abd pain in the past. This continued for 45 minutes, but was controlled when she received a dose of morphine. Overall, patient's pain is better controlled. She is tolerating transition to healthy heart diet.  Objective: Vital signs in last 24 hours: Filed Vitals:   04/28/15 2232 04/28/15 2352 04/29/15 0010 04/29/15 0540  BP: 170/94 154/82  156/81  Pulse:   81 74  Temp:   98.9 F (37.2 C) 98.4 F (36.9 C)  TempSrc:   Oral Oral  Resp:    20  Height:      Weight:    229 lb 4.5 oz (104 kg)  SpO2:    97%    Weight change: 9 lb 2.6 oz (4.155 kg) Filed Weights   04/27/15 0514 04/28/15 0549 04/29/15 0540  Weight: 221 lb 5.5 oz (100.4 kg) 220 lb 1.9 oz (99.845 kg) 229 lb 4.5 oz (104 kg)    I/O: I: 240 mL PO, Medlocked O: 30 mL fluid from drain,       5 unmeasured urine, 1 BM  Physical Exam General appearance: [name] was alert, cooperative and in no distress this morning. She was eating breakfast herself and very pleasant. Lungs: Anterior auscultation showed R upper respiratory wheezes and bilateral diminished breath sounds. Heart: regular rate and rhythm, S1 and S2 normal Abdomen: Distended abdomen that was tender to palpation. Positive Murphy's sign. normal bowel sounds prsent. Extremities: bilateral Shela Esses pitting edema, unchanged from yesterday Neurologic: Grossly normal: Alert and oriented x3. She is able to hold conversation and is overall in less pain.  Lab Results: CMP Latest Ref Rng 04/29/2015 04/28/2015 04/27/2015  Glucose 65 - 99 mg/dL 97 173(H) 111(H)  BUN 6 - 20 mg/dL 18 19 24(H)  Creatinine 0.44 - 1.00 mg/dL 0.60 0.64 0.86  Sodium 135 - 145 mmol/L 135 138 135  Potassium 3.5 - 5.1 mmol/L 4.3 3.4(L) 3.0(L)  Chloride 101 - 111 mmol/L 106 107 106   CO2 22 - 32 mmol/L 22 21(L) 22  Calcium 8.9 - 10.3 mg/dL 9.2 9.1 8.5(L)  Total Protein 6.5 - 8.1 g/dL - - 5.7(L)  Total Bilirubin 0.3 - 1.2 mg/dL - - 0.4  Alkaline Phos 38 - 126 U/L - - 79  AST 15 - 41 U/L - - 16  ALT 14 - 54 U/L - - 13(L)     CBC Latest Ref Rng 04/29/2015 04/28/2015 04/27/2015  WBC 4.0 - 10.5 K/uL 12.4(H) 15.8(H) 16.7(H)  Hemoglobin 12.0 - 15.0 g/dL 10.8(L) 11.7(L) 11.4(L)  Hematocrit 36.0 - 46.0 % 32.9(L) 35.0(L) 33.9(L)  Platelets 150 - 400 K/uL 239 273 237    Micro Results: Body Fluid Culture: Gallbladder : Received: 05/21 - Gram stain: Abundant WBC present, predominantly PMN             - Abundant Gram + rods             - Moderate Gram - rods - Cultures: multiple organisms present: none predominant  Blood culture x2: Received: 05/21 - Pending: no growth to date   Medications: I have reviewed the patient's current medications. Scheduled Meds: . famotidine  40 mg Oral QHS  . hydrochlorothiazide  12.5 mg Oral Daily  . ketorolac  15 mg Intravenous 4 times per day  .  mometasone-formoterol  2 puff Inhalation BID  . pantoprazole  20 mg Oral BID AC  . piperacillin-tazobactam (ZOSYN)  IV  3.375 g Intravenous 3 times per day  . sertraline  100 mg Oral BID  . tiotropium  18 mcg Inhalation Daily  . topiramate  50 mg Oral BID   PRN Meds:.acetaminophen **OR** acetaminophen, albuterol, ALPRAZolam, morphine injection, ondansetron **OR** ondansetron (ZOFRAN) IV, polyvinyl alcohol  Assessment/Plan:  Jennifer [name] is a 74 y.o.  female  who is on hospital day 4 at [facility] for sepsis secondary to acute cholecystitis.  Sepsis secondary to acute cholecystitis: Jennifer [name] has been afebrile and continue to have decreasing WBC (24 -> 16.7 -> 15.8 -> 12.4 today). Site of the percutaneous cholecystostomy was clean with no leakage from site. The tube drained 30 mL yesterday, totaling to 320 mL since placement. Blood cultures show no growth to date. Body fluid  cultures grew multiple organisms but none were predominant. She is on day 5/7 of zosyn.   -Continue Zosyn 3.375 gm IV q8h (4hour infusion), per pharmacy -Advance to healthy heart diet -Medlocked -F/u blood cultures x2 (05/21): multiple organisms, none predominant. -F/u body fluid cultures (05/21): NGTD -morphine 2 mg q4hprn + toradol 50 mg q6h prn -ketorolac 15 mg iv q6h -zofran 4 mg po or iv q6hprn   -tylenol 650 mg po or pr q6hprn -protonix 40 mg iv qhs  Atrial flutter with aberrancy: Jennifer [name] did not have any arrhythmia episodes yesterday. Her atrial flutter with aberrancy is most likely a secondary physiology abnormality due to pain and sepsis. She has had two episodes of arrhythmia since admission. Cardiology classified the first as atrial flutter with aberrancy and the second as a persistent junctional reciprocating tachycardia. An Echo on 05/21 showed normal cavity size and shape of the left ventricle, but mild left atrial dilation and dilated ascending aorta. Cardiology was not concerned and did not recommend changes to her plan of care. -Continue to monitor via telemetry  HTN: BP is increasing and rose to 170/94 yesterday. Home medication of HCTZ 12 mg PO daily was added yesterday afternoon. Her BP was 156/81 this morning. - HCTZ 12.5 mg daily -Continue to hold home medications of nifedipine 90 mg daily, ASA 81 mg daily.  OSA: Patient used CPAP for a little while last night but took it off because it was bothering her. She does not have SOB and is satting between 95-99% on 3L [place].  -Continue CPAP qhs  COPD: Patient is having shortness of breath, but reports it is not different from baseline.   -Dulera1 puff bid, spiriva 18 mch inh daily, and albuterol 1-2 puff q6hprn  Chronic pain: At home she takes norco 7.5-325 1 tab qidprn, amitriptyline 50 mg qhs, carisoprodol 350 mg dailyprn. -Zoloft 100 mg bid, topamax 50 mg bid and xanax 0.5 mg qhs  Chronic Diastolic CHF: No acute  problems during admission. Last TTE 11/2010 w EF 40-98%, grade 1 diastolic dysfunction. At home she is on ASA 81 mg daily. - Continue to hold ASA 81 mg daily  FEN F: Medlocked E: Normal- Potassium is 4.3 today.  N: Healthy heart  VTE prophylaxis - SCDs preoperatively  Code: Full  Dispo: Disposition is deferred at this time. Possible discharge in 2 days.  Ms. Kage is currently established with Axel Filler, MD as her PCP and will need to follow up with him in 7-10 days after discharge. She will also need to follow-up with the surgery outpatient clinic  approximately 6 weeks after discharge.   .Services Needed at time of discharge: Y = Yes, Blank = No PT:   OT:   RN:   Equipment:   Other:     LOS: 4 days   Carla Drape, Med Student 04/29/2015, 7:29 AM

## 2015-04-29 NOTE — Progress Notes (Signed)
  Date: 04/29/2015  Patient name: Jennifer [name]  Medical record number: [id number]  Date of birth: [birth year]   This patient's plan of care was discussed with the house staff. Please see Dr. Serita Grit note for complete details. I concur with his findings.  Jennifer [name] will complete a course of Zosyn for seven days, she is currently on day 4 of 7 (will start day 5 as of the 2200 dose today).  She is improved, WBC is improved as well.     Sid Falcon, MD 04/29/2015, 1:22 PM

## 2015-04-29 NOTE — Progress Notes (Signed)
Subjective: This morning, her daughters at bedside. She denies any complaints and was eating her breakfast. Her daughter had questions regarding insurance coverage for a "elective procedure." We explained to her that though the cholecystectomy was described using this time, it was not the same as a cosmetic procedure and therefore would be covered by her mother's insurance plan.  Objective: Vital signs in last 24 hours: Filed Vitals:   04/28/15 2232 04/28/15 2352 04/29/15 0010 04/29/15 0540  BP: 170/94 154/82  156/81  Pulse:   81 74  Temp:   98.9 F (37.2 C) 98.4 F (36.9 C)  TempSrc:   Oral Oral  Resp:    20  Height:      Weight:    229 lb 4.5 oz (104 kg)  SpO2:    97%   Weight change: 9 lb 2.6 oz (4.155 kg)  Intake/Output Summary (Last 24 hours) at 04/29/15 1049 Last data filed at 04/29/15 0907  Gross per 24 hour  Intake    960 ml  Output     30 ml  Net    930 ml   Physical Exam: General: Elderly Caucasian female, resting in bed, 3L O2 by nasal cannula HEENT: PERRL, EOMI, no scleral icterus, oropharynx clear Cardiac: RRR, no rubs, murmurs or gallops Pulm: clear to auscultation bilaterally in the anterior lung fields, no wheezes, rales, or rhonchi Abd: Incision site with bandage clean/dry/intact, tenderness elicited with palpation around it, normoactive bowel sounds Ext: warm and well perfused, no pedal edema Neuro: responds to questions appropriately; moving all extremities freely   Lab Results: Basic Metabolic Panel:  Recent Labs Lab 04/26/15 0541 04/27/15 0617 04/28/15 0325 04/29/15 0335  NA 136 135 138 135  K 3.5 3.0* 3.4* 4.3  CL 107 106 107 106  CO2 21* 22 21* 22  GLUCOSE 85 111* 173* 97  BUN 17 24* 19 18  CREATININE 0.79 0.86 0.64 0.60  CALCIUM 8.7* 8.5* 9.1 9.2  MG 1.9 1.9 2.1  --   PHOS 3.2  --   --   --    Liver Function Tests:  Recent Labs Lab 04/25/15 1506 04/27/15 0617  AST 25 16  ALT 15 13*  ALKPHOS 73 79  BILITOT 0.4 0.4  PROT 7.8  5.7*  ALBUMIN 3.6 2.3*    Recent Labs Lab 04/25/15 1506  LIPASE 16*   CBC:  Recent Labs Lab 04/25/15 1506 04/26/15 0541  04/28/15 0325 04/29/15 0335  WBC 18.6* 24.0*  < > 15.8* 12.4*  NEUTROABS 16.7* 20.4*  --   --   --   HGB 15.1* 13.0  < > 11.7* 10.8*  HCT 44.1 38.4  < > 35.0* 32.9*  MCV 98.0 98.2  < > 97.2 100.0  PLT 292 264  < > 273 239  < > = values in this interval not displayed. Cardiac Enzymes:  Recent Labs Lab 04/25/15 2347 04/26/15 0541 04/26/15 1110  TROPONINI 0.03 0.03 <0.03   CBG:  Recent Labs Lab 04/26/15 0557 04/28/15 0553  GLUCAP 103* 132*   Urinalysis:  Recent Labs Lab 04/25/15 1656  COLORURINE AMBER*  LABSPEC 1.023  PHURINE 7.5  GLUCOSEU NEGATIVE  HGBUR NEGATIVE  BILIRUBINUR SMALL*  KETONESUR NEGATIVE  PROTEINUR 30*  UROBILINOGEN 1.0  NITRITE NEGATIVE  LEUKOCYTESUR SMALL*   Studies/Results: No results found. Medications: I have reviewed the patient's current medications. Scheduled Meds: . famotidine  40 mg Oral QHS  . hydrochlorothiazide  12.5 mg Oral Daily  . ketorolac  15 mg  Intravenous 4 times per day  . mometasone-formoterol  2 puff Inhalation BID  . pantoprazole  20 mg Oral BID AC  . piperacillin-tazobactam (ZOSYN)  IV  3.375 g Intravenous 3 times per day  . sertraline  100 mg Oral BID  . tiotropium  18 mcg Inhalation Daily  . topiramate  50 mg Oral BID   Continuous Infusions:   PRN Meds:.acetaminophen **OR** acetaminophen, albuterol, ALPRAZolam, HYDROcodone-acetaminophen, morphine injection, ondansetron **OR** ondansetron (ZOFRAN) IV, polyvinyl alcohol   Assessment/Plan: Ms. Deshmukh is a 74 year old female with HTN, chronic dCHF, GERD, depression, anxiety, and chronic pain hospitalized for acute cholecystitis status post percutaneous cholecystostomy [day 3/7, week 1/6].    Acute cholecystitis s/p percutaneous cholecystostomy: Continues to show improvement overall. Leukocytosis continues to trend downwards. Blood  cultures still negative to date, biliary fluid culture shows gram positive rods and gram negative rods. Patient to keep cholecystostomy tube for 6 weeks, then have cholecystectomy per surgery notes.  -Restart home Norco 7.5/325 mg every 4 hours as needed -Continue Toradol 50 mg every 6 hours  -Continue morphine 1 mg q4h prn for severe pain with plan to transition off -Continue Zofran 4 mg q6h prn -Continue Protonix 40 mg -Continue Zosyn (day 5/7) -Follow-up blood, biliary fluid cultures -Advancing diet as tolerated -Follow-up PT/OT recommendations  Atrial Flutter w/ Aberrancy: Telemetry review without significant findings. EF 60-65% with grade 1 diastolic dysfunction as noted on echo on this admission, stable from December 2011. -Continue telemetry  HTN: Her blood pressure was increasing throughout yesterday, so HCTZ 12.5 mg was resumed. Her blood pressure this morning was improved. -Continue HCTZ 12.5 mg  Chronic dCHF: Echo findings as noted above. Her medications include HCTZ and ASA 81 mg. -HCTZ as noted above  COPD: Stable.  -Continue Dulera, Spiriva  OSA: Stable. -Continue CPAP qhs  Depression/Anxiety/Chronic Pain: Stable. Still some concern for in-hospital delirium given age, pain, and narcotic medications.  -Continue Zoloft 100 mg bid -Continue Topamax 50 mg bid -Continue Xanax 0.5 mg qhs for now (takes differently at home).  #FEN:  -Diet: Clears, advancing as tolerated -Give K-Dur 40 mEq for potassium 3.4  #DVT prophylaxis: SCDs  #CODE STATUS: FULL CODE   Dispo: Disposition is deferred at this time, awaiting improvement of current medical problems.   The patient does have a current PCP Bertha Stakes, MD) and does need an [facility] hospital follow-up appointment after discharge.  The patient does have transportation limitations that hinder transportation to clinic appointments.  .Services Needed at time of discharge: Y = Yes, Blank = No PT:   OT:   RN:   Equipment:    Other:     LOS: 4 days   Riccardo Dubin, MD 04/29/2015, 10:49 AM

## 2015-04-29 NOTE — Progress Notes (Signed)
Patient ID: Jennifer [name], female   DOB: 1941/07/26, 74 y.o.   MRN: [id number]     [facility]      [address] [place]   [place], [place] [address]    Phone: [phone] FAX: [phone]     Subjective: Sore around drain site.  No n/v.  Having BMS.  Tolerating fulls but not much options with lactose intolerance.  WBC down.  Afebrile.    Objective:  Vital signs:  Filed Vitals:   04/28/15 2232 04/28/15 2352 04/29/15 0010 04/29/15 0540  BP: 170/94 154/82  156/81  Pulse:   81 74  Temp:   98.9 F (37.2 C) 98.4 F (36.9 C)  TempSrc:   Oral Oral  Resp:    20  Height:      Weight:    104 kg (229 lb 4.5 oz)  SpO2:    97%    Last BM Date: 04/25/15  Intake/Output   Yesterday:  05/23 0701 - 05/24 0700 In: 340 [P.O.:240; IV Piggyback:100] Out: 30 [Drains:30] This shift:    I/O last 3 completed shifts: In: 1120 [P.O.:920; IV Piggyback:200] Out: 70 [Drains:70]     Physical Exam: General: Pt awake/alert/oriented x4 in no acute distress Abdomen: Soft. Nondistended. minimal TTP to RUQ and LLQ. Drain with bilious output. No evidence of peritonitis. No incarcerated hernias.    Problem List:   Principal Problem:   Acute cholecystitis Active Problems:   Hyperlipidemia   Depression   Obstructive sleep apnea   Meniere's disease   Essential hypertension   Diastolic heart failure   COPD with emphysema   Chronic low back pain   Fibromyalgia   Hypokalemia   Lactic acidosis   Paroxysmal ventricular tachycardia   Wide-complex tachycardia   Paroxysmal atrial fibrillation   Shortness of breath   Morbid obesity    Results:   Labs: Results for orders placed or performed during the hospital encounter of 04/25/15 (from the past 48 hour(s))  Basic metabolic panel     Status: Abnormal   Collection Time: 04/28/15  3:25 AM  Result Value Ref Range   Sodium 138 135 - 145 mmol/L   Potassium 3.4 (L) 3.5 - 5.1 mmol/L   Chloride 107  101 - 111 mmol/L   CO2 21 (L) 22 - 32 mmol/L   Glucose, Bld 173 (H) 65 - 99 mg/dL   BUN 19 6 - 20 mg/dL   Creatinine, Ser 8.80 0.44 - 1.00 mg/dL   Calcium 9.1 8.9 - 55.9 mg/dL   GFR calc non Af Amer >60 >60 mL/min   GFR calc Af Amer >60 >60 mL/min    Comment: (NOTE) The eGFR has been calculated using the CKD EPI equation. This calculation has not been validated in all clinical situations. eGFR's persistently <60 mL/min signify possible Chronic Kidney Disease.    Anion gap 10 5 - 15  Magnesium     Status: None   Collection Time: 04/28/15  3:25 AM  Result Value Ref Range   Magnesium 2.1 1.7 - 2.4 mg/dL  CBC     Status: Abnormal   Collection Time: 04/28/15  3:25 AM  Result Value Ref Range   WBC 15.8 (H) 4.0 - 10.5 K/uL   RBC 3.60 (L) 3.87 - 5.11 MIL/uL   Hemoglobin 11.7 (L) 12.0 - 15.0 g/dL   HCT 86.0 (L) 90.1 - 69.8 %   MCV 97.2 78.0 - 100.0 fL   MCH 32.5 26.0 - 34.0 pg  MCHC 33.4 30.0 - 36.0 g/dL   RDW 13.4 11.5 - 15.5 %   Platelets 273 150 - 400 K/uL  Glucose, capillary     Status: Abnormal   Collection Time: 04/28/15  5:53 AM  Result Value Ref Range   Glucose-Capillary 132 (H) 65 - 99 mg/dL  Basic metabolic panel     Status: None   Collection Time: 04/29/15  3:35 AM  Result Value Ref Range   Sodium 135 135 - 145 mmol/L   Potassium 4.3 3.5 - 5.1 mmol/L    Comment: DELTA CHECK NOTED   Chloride 106 101 - 111 mmol/L   CO2 22 22 - 32 mmol/L   Glucose, Bld 97 65 - 99 mg/dL   BUN 18 6 - 20 mg/dL   Creatinine, Ser 0.60 0.44 - 1.00 mg/dL   Calcium 9.2 8.9 - 10.3 mg/dL   GFR calc non Af Amer >60 >60 mL/min   GFR calc Af Amer >60 >60 mL/min    Comment: (NOTE) The eGFR has been calculated using the CKD EPI equation. This calculation has not been validated in all clinical situations. eGFR's persistently <60 mL/min signify possible Chronic Kidney Disease.    Anion gap 7 5 - 15  CBC     Status: Abnormal   Collection Time: 04/29/15  3:35 AM  Result Value Ref Range   WBC  12.4 (H) 4.0 - 10.5 K/uL   RBC 3.29 (L) 3.87 - 5.11 MIL/uL   Hemoglobin 10.8 (L) 12.0 - 15.0 g/dL   HCT 32.9 (L) 36.0 - 46.0 %   MCV 100.0 78.0 - 100.0 fL   MCH 32.8 26.0 - 34.0 pg   MCHC 32.8 30.0 - 36.0 g/dL   RDW 13.5 11.5 - 15.5 %   Platelets 239 150 - 400 K/uL    Imaging / Studies: No results found.  Medications / Allergies:  Scheduled Meds: . famotidine  40 mg Oral QHS  . hydrochlorothiazide  12.5 mg Oral Daily  . ketorolac  15 mg Intravenous 4 times per day  . mometasone-formoterol  2 puff Inhalation BID  . pantoprazole  20 mg Oral BID AC  . piperacillin-tazobactam (ZOSYN)  IV  3.375 g Intravenous 3 times per day  . sertraline  100 mg Oral BID  . tiotropium  18 mcg Inhalation Daily  . topiramate  50 mg Oral BID   Continuous Infusions:  PRN Meds:.acetaminophen **OR** acetaminophen, albuterol, ALPRAZolam, morphine injection, ondansetron **OR** ondansetron (ZOFRAN) IV, polyvinyl alcohol  Antibiotics: Anti-infectives    Start     Dose/Rate Route Frequency Ordered Stop   04/26/15 0400  piperacillin-tazobactam (ZOSYN) IVPB 3.375 g     3.375 g 12.5 mL/hr over 240 Minutes Intravenous 3 times per day 04/25/15 2346     04/25/15 2000  cefTRIAXone (ROCEPHIN) 1 g in dextrose 5 % 50 mL IVPB  Status:  Discontinued     1 g 100 mL/hr over 30 Minutes Intravenous  Once 04/25/15 1951 04/25/15 1956   04/25/15 2000  piperacillin-tazobactam (ZOSYN) IVPB 3.375 g     3.375 g 100 mL/hr over 30 Minutes Intravenous  Once 04/25/15 1956 04/25/15 2052       Assessment/Plan Sepsis Acute cholecystitis-improving, s/p cholecystostomy tube, to remain for 6 weeks, then interval cholecystectomy. F/U with Dr. Hulen Skains. Cards eval(moderate risk, no further eval) -PT eval ID-zosyn D#4/7 GPR, GNR on culture, follow FEN-advance to low fat diet, resume home pain meds to wean off morphine    Erby Pian, ANP-BC [facility]  Pager [phone](7A-4:30P) For consults and floor pages call  [phone](7A-4:30P)  04/29/2015 7:47 AM

## 2015-04-29 NOTE — Care Management Note (Signed)
Case Management Note  Patient Details  Name: Jennifer [name] MRN: [id number] Date of Birth: [birth year]  Subjective/Objective:      Admitted with Acute cholecystitis             Action/Plan: CM following for DCP  Expected Discharge Date:  05/01/2015               Expected Discharge Plan:  Home/Self Care  In-House Referral:     Discharge planning Services  CM Consult    Status of Service:  In process, will continue to follow  Medicare Important Message Given:  Yes Date Medicare IM Given:  04/29/15 Medicare IM give by:  Mindi Slicker RN  Royston Bake, RN,BSN,MHA [phone] 04/29/2015, 12:45 PM

## 2015-04-29 NOTE — Progress Notes (Signed)
Patient states she has not been able to use nocturnal CPAP and she has been taking it off at night prior to falling to sleep. She and family agrees to bring home machine/mask for use tomorrow night. For tonight, she wishes to only use supplemental oxygen. RT will continue to follow.

## 2015-04-30 LAB — BASIC METABOLIC PANEL
ANION GAP: 9 (ref 5–15)
BUN: 9 mg/dL (ref 6–20)
CO2: 26 mmol/L (ref 22–32)
Calcium: 9.1 mg/dL (ref 8.9–10.3)
Chloride: 100 mmol/L — ABNORMAL LOW (ref 101–111)
Creatinine, Ser: 0.65 mg/dL (ref 0.44–1.00)
GFR calc Af Amer: >60 mL/min (ref >60)
GLUCOSE: 111 mg/dL — AB (ref 65–99)
POTASSIUM: 4 mmol/L (ref 3.5–5.1)
Sodium: 135 mmol/L (ref 135–145)

## 2015-04-30 LAB — BODY FLUID CULTURE

## 2015-04-30 MED ORDER — NIFEDIPINE ER OSMOTIC RELEASE 90 MG PO TB24
90.0000 mg | ORAL_TABLET | Freq: Every day | ORAL | Status: DC
  Administered 2015-04-30 – 2015-05-02 (×3): 90 mg via ORAL
  Filled 2015-04-30 (×3): qty 1

## 2015-04-30 NOTE — Progress Notes (Signed)
Patient ID: Jennifer [name], female   DOB: 1941-01-13, 74 y.o.   MRN: [id number]     [facility]      [address] [place], [place] [address]    Phone: [phone] FAX: [phone]     Subjective: Tolerating solids.  VSS.  Afebrile.  Got up with   Objective:  Vital signs:  Filed Vitals:   04/29/15 1353 04/29/15 2058 04/30/15 0437 04/30/15 0612  BP: 164/90 159/91  168/86  Pulse: 83 80  76  Temp: 98.4 F (36.9 C) 99 F (37.2 C)  98.8 F (37.1 C)  TempSrc: Oral Oral  Oral  Resp: $Remo'18 20  20  'sdUyt$ Height:      Weight:   102.15 kg (225 lb 3.2 oz)   SpO2: 99% 99%  98%    Last BM Date: 04/29/15  Intake/Output   Yesterday:  05/24 0701 - 05/25 0700 In: 6553 [P.O.:1400; IV Piggyback:150] Out: 70 [Drains:70] This shift:    I/O last 3 completed shifts: In: 1890 [P.O.:1640; IV Piggyback:250] Out: 100 [Drains:100]     Physical Exam: General: Pt awake/alert/oriented x4 in no acute distress Abdomen: Soft. Nondistended. non tender. Drain with bilious output. No evidence of peritonitis. No incarcerated hernias.    Problem List:   Principal Problem:   Acute cholecystitis Active Problems:   Hyperlipidemia   Depression   Obstructive sleep apnea   Meniere's disease   Essential hypertension   Diastolic heart failure   COPD with emphysema   Chronic low back pain   Fibromyalgia   Hypokalemia   Lactic acidosis   Paroxysmal ventricular tachycardia   Wide-complex tachycardia   Paroxysmal atrial fibrillation   Shortness of breath   Morbid obesity    Results:   Labs: Results for orders placed or performed during the hospital encounter of 04/25/15 (from the past 48 hour(s))  Basic metabolic panel     Status: None   Collection Time: 04/29/15  3:35 AM  Result Value Ref Range   Sodium 135 135 - 145 mmol/L   Potassium 4.3 3.5 - 5.1 mmol/L    Comment: DELTA CHECK NOTED   Chloride 106 101 - 111 mmol/L   CO2 22 22 - 32 mmol/L    Glucose, Bld 97 65 - 99 mg/dL   BUN 18 6 - 20 mg/dL   Creatinine, Ser 0.60 0.44 - 1.00 mg/dL   Calcium 9.2 8.9 - 10.3 mg/dL   GFR calc non Af Amer >60 >60 mL/min   GFR calc Af Amer >60 >60 mL/min    Comment: (NOTE) The eGFR has been calculated using the CKD EPI equation. This calculation has not been validated in all clinical situations. eGFR's persistently <60 mL/min signify possible Chronic Kidney Disease.    Anion gap 7 5 - 15  CBC     Status: Abnormal   Collection Time: 04/29/15  3:35 AM  Result Value Ref Range   WBC 12.4 (H) 4.0 - 10.5 K/uL   RBC 3.29 (L) 3.87 - 5.11 MIL/uL   Hemoglobin 10.8 (L) 12.0 - 15.0 g/dL   HCT 32.9 (L) 36.0 - 46.0 %   MCV 100.0 78.0 - 100.0 fL   MCH 32.8 26.0 - 34.0 pg   MCHC 32.8 30.0 - 36.0 g/dL   RDW 13.5 11.5 - 15.5 %   Platelets 239 150 - 400 K/uL  Basic metabolic panel     Status: Abnormal   Collection Time: 04/30/15  4:22 AM  Result  Value Ref Range   Sodium 135 135 - 145 mmol/L   Potassium 4.0 3.5 - 5.1 mmol/L   Chloride 100 (L) 101 - 111 mmol/L   CO2 26 22 - 32 mmol/L   Glucose, Bld 111 (H) 65 - 99 mg/dL   BUN 9 6 - 20 mg/dL   Creatinine, Ser 0.65 0.44 - 1.00 mg/dL   Calcium 9.1 8.9 - 10.3 mg/dL   GFR calc non Af Amer >60 >60 mL/min   GFR calc Af Amer >60 >60 mL/min    Comment: (NOTE) The eGFR has been calculated using the CKD EPI equation. This calculation has not been validated in all clinical situations. eGFR's persistently <60 mL/min signify possible Chronic Kidney Disease.    Anion gap 9 5 - 15    Imaging / Studies: No results found.  Medications / Allergies:  Scheduled Meds: . famotidine  40 mg Oral QHS  . hydrochlorothiazide  12.5 mg Oral Daily  . ketorolac  15 mg Intravenous 4 times per day  . mometasone-formoterol  2 puff Inhalation BID  . pantoprazole  20 mg Oral BID AC  . piperacillin-tazobactam (ZOSYN)  IV  3.375 g Intravenous 3 times per day  . sertraline  100 mg Oral BID  . tiotropium  18 mcg Inhalation  Daily  . topiramate  50 mg Oral BID   Continuous Infusions:  PRN Meds:.acetaminophen **OR** acetaminophen, albuterol, ALPRAZolam, HYDROcodone-acetaminophen, meclizine, morphine injection, ondansetron **OR** ondansetron (ZOFRAN) IV, polyvinyl alcohol  Antibiotics: Anti-infectives    Start     Dose/Rate Route Frequency Ordered Stop   04/26/15 0400  piperacillin-tazobactam (ZOSYN) IVPB 3.375 g     3.375 g 12.5 mL/hr over 240 Minutes Intravenous 3 times per day 04/25/15 2346     04/25/15 2000  cefTRIAXone (ROCEPHIN) 1 g in dextrose 5 % 50 mL IVPB  Status:  Discontinued     1 g 100 mL/hr over 30 Minutes Intravenous  Once 04/25/15 1951 04/25/15 1956   04/25/15 2000  piperacillin-tazobactam (ZOSYN) IVPB 3.375 g     3.375 g 100 mL/hr over 30 Minutes Intravenous  Once 04/25/15 1956 04/25/15 2052        Assessment/Plan Sepsis Acute cholecystitis-improving, s/p cholecystostomy tube, to remain for 6 weeks, then interval cholecystectomy. F/U with Dr. Hulen Skains. Cards eval(moderate risk, no further eval) -teach family drain care ID-zosyn D#5/7 GPR, GNR on culture, may change to PO to complete total of 7 days. FEN-continue low fat diet Dispo-stable for DC from surgical standpoint, HH for drain care    Erby Pian, [facility] Surgery Pager [phone](7A-4:30P) For consults and floor pages call [phone](7A-4:30P)  04/30/2015 7:46 AM

## 2015-04-30 NOTE — Progress Notes (Signed)
Placed pt. On cpap. Pt. Tolerating well at this time. Pt. Has her own mask and tubing and is on 17cmh20 per pt. Home regimen.

## 2015-04-30 NOTE — Progress Notes (Signed)
Name: Jennifer [name] MRN: [id number] Date: 04/30/2015 LOS: 5  Subjective: Pulmonology saw her yesterday about CPAP machine. Jennifer [name] has not been using the hospital CPAP machine due to mask discomfort. She currently does not need O2 during the day and is satting well on 3L [place] O2 at night. Her daughter agrees to bring her home CPAP mask for her to use tonight. PT also saw her yesterday to work on her mobility. She was able to get up by herself and walk to the bedside armchair.They recommend home health PT and intermittent supervision. Daughter is very concerned with Jennifer [name] mobility upon discharge. She is unable to assist Jennifer [name] if needed at home due to her own disabilities. She prefers if Jennifer [name] is able to move from her bedroom to the kitchen and bathroom by herself before being discharged to her home.   Objective: Vital signs in last 24 hours: Filed Vitals:   04/29/15 1353 04/29/15 2058 04/30/15 0437 04/30/15 0612  BP: 164/90 159/91  168/86  Pulse: 83 80  76  Temp: 98.4 F (36.9 C) 99 F (37.2 C)  98.8 F (37.1 C)  TempSrc: Oral Oral  Oral  Resp: 18 20  20   Height:      Weight:   225 lb 3.2 oz (102.15 kg)   SpO2: 99% 99%  98%    Weight change: -4 lb 1.3 oz (-1.85 kg) Filed Weights   04/28/15 0549 04/29/15 0540 04/30/15 0437  Weight: 220 lb 1.9 oz (99.845 kg) 229 lb 4.5 oz (104 kg) 225 lb 3.2 oz (102.15 kg)    I/O: I: Medlocked, 1400 mL PO O: 19mL cholecystostomy tube,  7 unmeasured urine, 0 BM  Physical Exam General appearance: Jennifer [name] was alert, cooperative and no distress this morning. Lungs: clear to auscultation bilaterally Heart: regular rate and rhythm and S1, S2 normal Abdomen: distended, moderately tender upon palpation, normal bowel sounds Extremities: no edema on Jennifer [name]. Tube site: clear, dry and intact. There is mild erythema around the site but does not look infectious.   Lab Results: CMP Latest Ref Rng 04/30/2015 04/29/2015 04/28/2015  Glucose 65 - 99  mg/dL 111(H) 97 173(H)  BUN 6 - 20 mg/dL 9 18 19   Creatinine 0.44 - 1.00 mg/dL 0.65 0.60 0.64  Sodium 135 - 145 mmol/L 135 135 138  Potassium 3.5 - 5.1 mmol/L 4.0 4.3 3.4(L)  Chloride 101 - 111 mmol/L 100(L) 106 107  CO2 22 - 32 mmol/L 26 22 21(L)  Calcium 8.9 - 10.3 mg/dL 9.1 9.2 9.1  Total Protein 6.5 - 8.1 g/dL - - -  Total Bilirubin 0.3 - 1.2 mg/dL - - -  Alkaline Phos 38 - 126 U/L - - -  AST 15 - 41 U/L - - -  ALT 14 - 54 U/L - - -    Micro Results: Body Fluid Culture: Gallbladder : Received: 05/21 - Gram stain: Abundant WBC present, predominantly PMN - Abundant Gram + rods - Moderate Gram - rods - Cultures: multiple organisms present: none predominant  Blood culture x2: Received: 05/21 - Pending: no growth to date  Studies/Results: No new images  Medications: I have reviewed the patient's current medications. Scheduled Meds: . famotidine  40 mg Oral QHS  . hydrochlorothiazide  12.5 mg Oral Daily  . ketorolac  15 mg Intravenous 4 times per day  . mometasone-formoterol  2 puff Inhalation BID  . pantoprazole  20 mg Oral BID AC  . piperacillin-tazobactam (ZOSYN)  IV  3.375  g Intravenous 3 times per day  . sertraline  100 mg Oral BID  . tiotropium  18 mcg Inhalation Daily  . topiramate  50 mg Oral BID   PRN Meds:.acetaminophen **OR** acetaminophen, albuterol, ALPRAZolam, HYDROcodone-acetaminophen, meclizine, morphine injection, ondansetron **OR** ondansetron (ZOFRAN) IV, polyvinyl alcohol  Assessment/Plan:  Jennifer [name] is a 74 y.o. female who is on hospital day 5 at [facility] for sepsis due to acute cholecystitis.  Sepsis secondary to acute cholecystitis: Jennifer [name] has been afebrile and decreasing WBC (24 -> 16.7 -> 15.8 -> 12.4). A CBC was not ordered today, but WBC ct will be f/u tomorrow. Site of the percutaneous cholecystostomy was clean with no leakage from site. The tube drained 70 mL yesterday, totaling to 390 mL since  placement. Blood cultures show no growth to date. Body fluid cultures grew multiple organisms but none were predominant. She is on day (5-6) / 7 of zosyn. Jennifer [name] is having mild pain at the site of the percutaneous cholecystostomy and states that she has not had dressing change since her surgery.  -Consult wound care for possible dressing change.  -Continue Zosyn 3.375 gm IV q8h (4hour infusion), per pharmacy -D/C morphine 2 mg q4hprn + toradol 50 mg q6h prn -ketorolac 15 mg iv q6h -zofran 4 mg po or iv q6hprn  -tylenol 650 mg po or pr q6hprn -protonix 40 mg iv qhs -F/u blood cultures x2 (05/21): multiple organisms, none predominant. -F/u body fluid cultures (05/21): NGTD  Atrial flutter with aberrancy: Jennifer [name] did not have any arrhythmia episodes since 05/23. Her atrial flutter with aberrancy is most likely a secondary physiology abnormality due to pain and sepsis. She has had two episodes of arrhythmia since admission. Cardiology classified the first as atrial flutter with aberrancy and the second as a persistent junctional reciprocating tachycardia. An Echo on 05/21 showed normal cavity size and shape of the left ventricle, but mild left atrial dilation and dilated ascending aorta. Cardiology was not concerned and did not recommend changes to her plan of care. -Continue to monitor via telemetry  HTN: Jennifer [name] BP continues to to be elevated in the 150s-160s/80s-90s yesterday despite restarting HCTZ.  - Restart her home medication of nifedipine 90 mg daily. - HCTZ 12.5 mg daily - Continue to hold ASA 81mg  daily.  OSA: Pulmonology saw Jennifer [name] yesterday and asked daughter if she is able to bring her home CPAP mask for use while she is at the hospital. Daughter agrees. Jennifer [name] is able to sat 98-99% on room air during the day and sat between 95-99% on 3L [place] at night instead of CPAP.  -Continue CPAP qhs  COPD: Incentive spirometry was ordered yesterday, but patient did not receive  it. Will follow-up with RT today to inquire. Patient does not have any SOB today.  -Follow-up with RT on setting up ICS. -Dulera1 puff bid, spiriva 18 mch inh daily, and albuterol 1-2 puff q6hprn  Chronic pain: At home she takes norco 7.5-325 1 tab qidprn, amitriptyline 50 mg qhs, carisoprodol 350 mg dailyprn. -Zoloft 100 mg bid, topamax 50 mg bid and xanax 0.5 mg qhs  Chronic Diastolic CHF: No acute problems during admission. Last TTE 11/2010 w EF 97-98%, grade 1 diastolic dysfunction. At home she is on ASA 81 mg daily. - Continue to hold ASA 81 mg daily  FEN F: Medlocked E: None N: Healthy heart  VTE prophylaxis - SCD preoperatively  Code: Full  Dispo: Disposition is deferred at this time,  awaiting improvement of current medical problems.  Anticipated discharge in approximately 2-3 days.  Ms. Vandervoort is currently established with Axel Filler, MD as her PCP and will need to follow up with him in 7-10 days after discharge. She will also need to follow-up with the surgery outpatient clinic approximately 6 weeks after discharge.  .Services Needed at time of discharge: Y = Yes, Blank = No PT:   OT:   RN:   Equipment:   Other:     LOS: 5 days   Carla Drape, Med Student 04/30/2015, 6:46 AM

## 2015-04-30 NOTE — Progress Notes (Signed)
  Date: 04/30/2015  Patient name: Jennifer [name]  Medical record number: [id number]  Date of birth: November 16, 1941   This patient's plan of care was discussed with the house staff. Please see Dr. Serita Grit note for complete details. I concur with his findings.  Patient is progressing well.  Some continued discomfort at drain site, which is to be expected.  She is on day 5/7 of IV antibiotics.     Sid Falcon, MD 04/30/2015, 3:36 PM

## 2015-04-30 NOTE — Progress Notes (Signed)
Subjective: This morning, her daughter is at bedside. She continues to report that she is improving overall. Most of them are concerned that her drain site may be infected as it continues to feel sore, and we acknowledged that we would take a look though explained that she would require the drain until her elective procedure. She also reports that she did not receive her incentive spirometry yesterday.  Objective: Vital signs in last 24 hours: Filed Vitals:   04/29/15 2058 04/30/15 0437 04/30/15 0612 04/30/15 0813  BP: 159/91  168/86   Pulse: 80  76   Temp: 99 F (37.2 C)  98.8 F (37.1 C)   TempSrc: Oral  Oral   Resp: 20  20   Height:      Weight:  225 lb 3.2 oz (102.15 kg)    SpO2: 99%  98% 95%   Weight change: -4 lb 1.3 oz (-1.85 kg)  Intake/Output Summary (Last 24 hours) at 04/30/15 1028 Last data filed at 04/30/15 0858  Gross per 24 hour  Intake   1650 ml  Output     70 ml  Net   1580 ml   Physical Exam: General: Elderly Caucasian female, resting in bed,  HEENT: PERRL, EOMI, no scleral icterus, oropharynx clear Cardiac: RRR, no rubs, murmurs or gallops Pulm: clear to auscultation bilaterally in the anterior lung fields, no wheezes, rales, or rhonchi Abd: Incision site inspected with mild erythema around the incision site without drainage, tenderness elicited with palpation around it, normoactive bowel sounds Ext: warm and well perfused, no pedal edema Neuro: responds to questions appropriately; moving all extremities freely   Lab Results: Basic Metabolic Panel:  Recent Labs Lab 04/26/15 0541 04/27/15 0617 04/28/15 0325 04/29/15 0335 04/30/15 0422  NA 136 135 138 135 135  K 3.5 3.0* 3.4* 4.3 4.0  CL 107 106 107 106 100*  CO2 21* 22 21* 22 26  GLUCOSE 85 111* 173* 97 111*  BUN 17 24* 19 18 9   CREATININE 0.79 0.86 0.64 0.60 0.65  CALCIUM 8.7* 8.5* 9.1 9.2 9.1  MG 1.9 1.9 2.1  --   --   PHOS 3.2  --   --   --   --    Liver Function Tests:  Recent  Labs Lab 04/25/15 1506 04/27/15 0617  AST 25 16  ALT 15 13*  ALKPHOS 73 79  BILITOT 0.4 0.4  PROT 7.8 5.7*  ALBUMIN 3.6 2.3*    Recent Labs Lab 04/25/15 1506  LIPASE 16*   CBC:  Recent Labs Lab 04/25/15 1506 04/26/15 0541  04/28/15 0325 04/29/15 0335  WBC 18.6* 24.0*  < > 15.8* 12.4*  NEUTROABS 16.7* 20.4*  --   --   --   HGB 15.1* 13.0  < > 11.7* 10.8*  HCT 44.1 38.4  < > 35.0* 32.9*  MCV 98.0 98.2  < > 97.2 100.0  PLT 292 264  < > 273 239  < > = values in this interval not displayed. Cardiac Enzymes:  Recent Labs Lab 04/25/15 2347 04/26/15 0541 04/26/15 1110  TROPONINI 0.03 0.03 <0.03   CBG:  Recent Labs Lab 04/26/15 0557 04/28/15 0553  GLUCAP 103* 132*   Urinalysis:  Recent Labs Lab 04/25/15 1656  COLORURINE AMBER*  LABSPEC 1.023  PHURINE 7.5  GLUCOSEU NEGATIVE  HGBUR NEGATIVE  BILIRUBINUR SMALL*  KETONESUR NEGATIVE  PROTEINUR 30*  UROBILINOGEN 1.0  NITRITE NEGATIVE  LEUKOCYTESUR SMALL*   Studies/Results: No results found. Medications: I have reviewed the  patient's current medications. Scheduled Meds: . famotidine  40 mg Oral QHS  . hydrochlorothiazide  12.5 mg Oral Daily  . ketorolac  15 mg Intravenous 4 times per day  . mometasone-formoterol  2 puff Inhalation BID  . NIFEdipine  90 mg Oral Daily  . pantoprazole  20 mg Oral BID AC  . piperacillin-tazobactam (ZOSYN)  IV  3.375 g Intravenous 3 times per day  . sertraline  100 mg Oral BID  . tiotropium  18 mcg Inhalation Daily  . topiramate  50 mg Oral BID   Continuous Infusions:   PRN Meds:.acetaminophen **OR** acetaminophen, albuterol, ALPRAZolam, HYDROcodone-acetaminophen, meclizine, morphine injection, ondansetron **OR** ondansetron (ZOFRAN) IV, polyvinyl alcohol   Assessment/Plan: Ms. Avedisian is a 74 year old female with HTN, chronic dCHF, GERD, depression, anxiety, and chronic pain hospitalized for acute cholecystitis status post percutaneous cholecystostomy [day 4/7, week  1/6].    Acute cholecystitis s/p percutaneous cholecystostomy: Still improving. Blood cultures still negative to date, biliary fluid culture shows gram positive rods and gram negative rods. Patient to keep cholecystostomy tube for 6 weeks, then have cholecystectomy per surgery notes. PT recommends home health with intermittent supervision. -Continue home Norco 7.5/325 mg every 4 hours as needed -Discontinue Toradol and morphine -Continue Zofran 4 mg q6h prn -Continue Protonix 40 mg -Continue Zosyn status post 5/7 days of treatment -Follow-up blood, biliary fluid cultures -Continue PT/OT  Atrial Flutter w/ Aberrancy: Telemetry review without significant findings. EF 60-65% with grade 1 diastolic dysfunction as noted on echo on this admission, stable from December 2011. -Continue telemetry  HTN: Blood pressure continues to trend upward since HCTZ 12.5 mg was restarted. -Continue HCTZ 12.5 mg -Resume nifedipine 90 mg  Chronic dCHF: Echo findings as noted above. Her medications include HCTZ, nifedipine, and ASA 81 mg. -HCTZ, nifedipine as noted above  COPD: Stable.  -Continue Dulera, Spiriva  OSA: Stable. -Continue CPAP qhs  Depression/Anxiety/Chronic Pain: Stable. Still some concern for in-hospital delirium given age, pain, and narcotic medications.  -Continue Zoloft 100 mg bid -Continue Topamax 50 mg bid -Continue Xanax 0.5 mg qhs for now (takes differently at home).  #FEN:  -Diet:  Heart Healthy   #DVT prophylaxis: SCDs  #CODE STATUS: FULL CODE  Dispo: Disposition is deferred at this time, awaiting improvement of current medical problems.   The patient does have a current PCP Bertha Stakes, MD) and does need an [facility] hospital follow-up appointment after discharge.  The patient does have transportation limitations that hinder transportation to clinic appointments.  .Services Needed at time of discharge: Y = Yes, Blank = No PT:   OT:   RN:   Equipment:   Other:     LOS:  5 days   Riccardo Dubin, MD 04/30/2015, 10:28 AM

## 2015-04-30 NOTE — Progress Notes (Signed)
CSW received call this afternoon from Jennifer [name]- RNCM that daughter is requesting SNF placement for her mother.  CSW spoke with daughter Jennifer [name] via phone this afternoon and above confirmed- she stated that she and her mother are currently living in a house with her sister and she does not feel she can manage her mother's care at this time.  Current barriers to placement:  Patient's [employer] Medicare requires pre-authorization and at this time- PT is recommending Home Health PT.  Daughter remains emphatic that her mother's care needs are not able to be met at home at this time and that she would benefit from short term SNF. Discussed barriers to placement and unsure as to date of stability per MD. Patient agrees to short term SNF and may need to seek a Letter of Guarantee bed.  Daughter relates that they live half way between [place] and [place]- would love placement in [place].  Will need to determine with PT is their current recommendation for home care remains in place. If so- it is doubtful that SNF approval will be given by [employer]. Active bed search will be initiated. SW psychosocial evaluation to follow.  [name], LCSW [phone]     

## 2015-05-01 ENCOUNTER — Other Ambulatory Visit: Payer: Self-pay | Admitting: Internal Medicine

## 2015-05-01 LAB — BASIC METABOLIC PANEL
Anion gap: 10 (ref 5–15)
BUN: 8 mg/dL (ref 6–20)
CALCIUM: 8.8 mg/dL — AB (ref 8.9–10.3)
CO2: 26 mmol/L (ref 22–32)
Chloride: 97 mmol/L — ABNORMAL LOW (ref 101–111)
Creatinine, Ser: 0.68 mg/dL (ref 0.44–1.00)
GFR calc non Af Amer: >60 mL/min (ref >60)
GLUCOSE: 137 mg/dL — AB (ref 65–99)
Potassium: 3.1 mmol/L — ABNORMAL LOW (ref 3.5–5.1)
SODIUM: 133 mmol/L — AB (ref 135–145)

## 2015-05-01 LAB — CBC
HCT: 36.8 % (ref 36.0–46.0)
HEMOGLOBIN: 13 g/dL (ref 12.0–15.0)
MCH: 33.9 pg (ref 26.0–34.0)
MCHC: 35.3 g/dL (ref 30.0–36.0)
MCV: 96.1 fL (ref 78.0–100.0)
PLATELETS: ADEQUATE 10*3/uL (ref 150–400)
RBC: 3.83 MIL/uL — AB (ref 3.87–5.11)
RDW: 13.1 % (ref 11.5–15.5)
WBC: 16.1 10*3/uL — ABNORMAL HIGH (ref 4.0–10.5)

## 2015-05-01 LAB — GLUCOSE, CAPILLARY: Glucose-Capillary: 147 mg/dL — ABNORMAL HIGH (ref 65–99)

## 2015-05-01 MED ORDER — POTASSIUM CHLORIDE CRYS ER 20 MEQ PO TBCR
40.0000 meq | EXTENDED_RELEASE_TABLET | Freq: Two times a day (BID) | ORAL | Status: DC
  Administered 2015-05-01 – 2015-05-02 (×3): 40 meq via ORAL
  Filled 2015-05-01 (×4): qty 2

## 2015-05-01 NOTE — Procedures (Signed)
Pt placed on hospital CPAP set at 17 cmH2O.  Pt is using her own mask and tubing.  Pt is resting comfortably at this time.

## 2015-05-01 NOTE — Progress Notes (Signed)
Name: Jennifer [name] MRN: [id number] Date: 05/01/2015 LOS: 6  Subjective: Jennifer [name] received an ICS yesterday and has practiced with it. She also received a dressing change on her tube site. She states that the site is still sore, but is not causing any acute pain. She also denies nausea or fever. She states that she is feeling better today after using her home CPAP mask last night. Her daughter is still a little bit concerned on her mother's disposition upon discharge. Social work is currently working with the daughter to try to find a short-term facility for Jennifer [name] as she regains mobility. She will finish her course of abx tomorrow night at 2200.   Objective: Vital signs in last 24 hours: Filed Vitals:   04/30/15 2142 04/30/15 2200 05/01/15 0521 05/01/15 0544  BP:  144/77 144/76   Pulse:  79 78   Temp:  98.7 F (37.1 C) 99 F (37.2 C)   TempSrc:  Oral Oral   Resp: 17 18    Height:      Weight:    221 lb 14.4 oz (100.653 kg)  SpO2:  97% 93%     Weight change: -3 lb 4.8 oz (-1.497 kg) Filed Weights   04/29/15 0540 04/30/15 0437 05/01/15 0544  Weight: 229 lb 4.5 oz (104 kg) 225 lb 3.2 oz (102.15 kg) 221 lb 14.4 oz (100.653 kg)    I/O: I: 1060 mL PO, Medlocked O: 9 unmeasured urine, 1 BM  Physical Exam General appearance: Jennifer [name] was alert, cooperative and no distress this morning. Lungs: clear to auscultation bilaterally Heart: regular rate and rhythm and S1, S2 normal; skip beats when patient inspirates. Abdomen: distended, moderately tender upon palpation, normal bowel sounds Extremities: no edema on Jennifer [name]. Tube site: clear, dry and intact. There is new dressing placed around tube. Did not undo new taping at the site but bandage is clean w/ no strike through.  Lab Results: CMP Latest Ref Rng 04/30/2015 04/29/2015 04/28/2015  Glucose 65 - 99 mg/dL 111(H) 97 173(H)  BUN 6 - 20 mg/dL 9 18 19   Creatinine 0.44 - 1.00 mg/dL 0.65 0.60 0.64  Sodium 135 - 145 mmol/L 135 135 138   Potassium 3.5 - 5.1 mmol/L 4.0 4.3 3.4(L)  Chloride 101 - 111 mmol/L 100(L) 106 107  CO2 22 - 32 mmol/L 26 22 21(L)  Calcium 8.9 - 10.3 mg/dL 9.1 9.2 9.1  Total Protein 6.5 - 8.1 g/dL - - -  Total Bilirubin 0.3 - 1.2 mg/dL - - -  Alkaline Phos 38 - 126 U/L - - -  AST 15 - 41 U/L - - -  ALT 14 - 54 U/L - - -    CBC Latest Ref Rng 05/01/2015 04/29/2015 04/28/2015  WBC 4.0 - 10.5 K/uL PENDING 12.4(H) 15.8(H)  Hemoglobin 12.0 - 15.0 g/dL 13.0 10.8(L) 11.7(L)  Hematocrit 36.0 - 46.0 % 36.8 32.9(L) 35.0(L)  Platelets 150 - 400 K/uL PENDING 239 273    Body Fluid Culture: Gallbladder : Received: 05/21 - Gram stain: Abundant WBC present, predominantly PMN - Abundant Gram + rods - Moderate Gram - rods - Cultures: multiple organisms present: none predominant. No staph aureus or group A strep isolated.  Blood culture x2: Received: 05/21 - Pending: no growth to date  Studies/Results: No new images  Medications: I have reviewed the patient's current medications. Scheduled Meds: . famotidine  40 mg Oral QHS  . hydrochlorothiazide  12.5 mg Oral Daily  . mometasone-formoterol  2 puff Inhalation  BID  . NIFEdipine  90 mg Oral Daily  . pantoprazole  20 mg Oral BID AC  . piperacillin-tazobactam (ZOSYN)  IV  3.375 g Intravenous 3 times per day  . sertraline  100 mg Oral BID  . tiotropium  18 mcg Inhalation Daily  . topiramate  50 mg Oral BID   PRN Meds:.acetaminophen **OR** acetaminophen, albuterol, ALPRAZolam, HYDROcodone-acetaminophen, meclizine, morphine injection, ondansetron **OR** ondansetron (ZOFRAN) IV, polyvinyl alcohol  Assessment/Plan:  Sepsis secondary to acute cholecystitis: Jennifer [name] has been afebrile and decreasing WBC (24 -> 16.7 -> 15.8 -> 12.4). A CBC was not ordered today, but WBC ct will be f/u tomorrow. Site of the percutaneous cholecystostomy was clean with no leakage from site. The tube drained 50 mL yesterday, totaling to 440 mL since placement.  Blood cultures show no growth to date. Body fluid cultures grew multiple organisms but none were predominant. Jennifer [name] received dressing change by her nurse yesterday. She does not report any abd pain today.  - Replace Zosyn w/ augmentin PO. She will need 3 doses of augmentin. 1 this afternoon and two doses tmrw.  - ketorolac 15 mg iv q6h - zofran 4 mg po or iv q6hprn  - tylenol 650 mg po or pr q6hprn - protonix 40 mg iv qhs - F/u blood cultures x2 (05/21): multiple organisms, none predominant. : No staph aureus nor GAS isolated - F/u body fluid cultures (05/21): NGTD  Atrial flutter with aberrancy: Jennifer [name] did not have any arrhythmia episodes since 05/23. Her atrial flutter with aberrancy is most likely a secondary physiology abnormality due to pain and sepsis. She has had two episodes of arrhythmia since admission. Cardiology classified the first as atrial flutter with aberrancy and the second as a persistent junctional reciprocating tachycardia. An Echo on 05/21 showed normal cavity size and shape of the left ventricle, but mild left atrial dilation and dilated ascending aorta. Cardiology was not concerned and did not recommend changes to her plan of care. - D/C telemetry  HTN: Jennifer [name]'s BP is mildly decreased to appx 140s/70s from the 150s-160s/80s-90s yesterday. - Nifedipine 90 mg daily. (restarted on 05/25) - HCTZ 12.5 mg daily (restarted on 05/22) - Continue to hold ASA 81mg  daily.  OSA: Patient brought her home CPAP mask and tubing. RT saw pt and helped her place CPAP for the night. -Continue CPAP qhs  COPD: Patient states she feels a lot better today and her CPAP machine has helped her breathing.  -ICS at bedside, encourage patient to continue using -Dulera1 puff bid, spiriva 18 mch inh daily, and albuterol 1-2 puff q6hprn  Chronic pain: At home she takes norco 7.5-325 1 tab qidprn, amitriptyline 50 mg qhs, carisoprodol 350 mg dailyprn. -Zoloft 100 mg bid, topamax 50 mg bid  and xanax 0.5 mg qhs  Chronic Diastolic CHF: No acute problems during admission. Last TTE 11/2010 w EF 14-78%, grade 1 diastolic dysfunction. At home she is on ASA 81 mg daily. - Continue to hold ASA 81 mg daily  FEN F: Medlocked E: None N: Healthy heart  VTE prophylaxis - SCD preoperatively  Code: Full  Dispo: Disposition is deferred at this time. Anticipated discharge tomorrow morning. Waiting on PT re-consult.  Ms. Casella is currently established with Axel Filler, MD as her PCP and will need to follow up with him in 7-10 days after discharge. She will also need to follow-up with the surgery outpatient clinic approximately 6 weeks after discharge. Her daughter is currently trying to get her  admitted to SNF for short-term stay while patient is still rehabilitating. Social work is Financial risk analyst. Patient may need a 'Letter of Guarantee bed'. Will need a revised consult from PT for SNF instead of home health PT.   LOS: 6 days   Carla Drape, Med Student 05/01/2015, 7:51 AM '

## 2015-05-01 NOTE — Progress Notes (Signed)
Physical Therapy Treatment Patient Details Name: Jennifer [name] MRN: [id number] DOB: 13-Sep-1941 Today's Date: 05/01/2015    History of Present Illness 74 year old woman who presents with a three-day history of increasing right-sided abdominal pain and intermittent diarrhea. She developed fever to over 103 and family brought her to the emergency department for further evaluation. CT scan showed cholelithiasis, marked gallbladder wall thickening with a. surrounding Gallbladder fluid collection consistent with acute cholecystitis. Antibiotics administered in the emergency department. Surgery consulted.    PT Comments    Patient is requiring Min-Mod A for mobility overall. Patient lives with two daughters. One daughter works throughtout the day and the other daughter is on an assistive device herself and cannot physically assist patient at home. At this time patient would benefit from ongoing therapy at SNF to increase functional independence and reduce burden of care at home. Patient and family in agreement. Updated Case Manager as well. See recommendation below.   Follow Up Recommendations  SNF     Equipment Recommendations  None recommended by PT    Recommendations for Other Services       Precautions / Restrictions Precautions Precautions: Fall Restrictions Weight Bearing Restrictions: No    Mobility  Bed Mobility Overal bed mobility: Needs Assistance     Sidelying to sit: Mod assist       General bed mobility comments: cueing for technque. A for trunk support out of bed. Increased effort  Transfers Overall transfer level: Needs assistance Equipment used: Rolling walker (2 wheeled)   Sit to Stand: Min assist         General transfer comment: Min A to power up into standing. Max repeatative cues for hand placement and safety  Ambulation/Gait Ambulation/Gait assistance: Min assist Ambulation Distance (Feet): 40 Feet Assistive device: Rolling walker (2 wheeled) Gait  Pattern/deviations: Step-through pattern;Shuffle;Trunk flexed   Gait velocity interpretation: Below normal speed for age/gender General Gait Details: Patient in more controlled but limited by LE pain and fatigue. Cues for safe use of RW   Stairs            Wheelchair Mobility    Modified Rankin (Stroke Patients Only)       Balance                                    Cognition Arousal/Alertness: Awake/alert Behavior During Therapy: WFL for tasks assessed/performed Overall Cognitive Status: Within Functional Limits for tasks assessed                      Exercises      General Comments        Pertinent Vitals/Pain Pain Score: 10-Worst pain ever Pain Location: B feet with progressed ambulation Pain Descriptors / Indicators: Aching;Sore Pain Intervention(s): Monitored during session;Repositioned    Home Living                      Prior Function            PT Goals (current goals can now be found in the care plan section) Progress towards PT goals: Progressing toward goals    Frequency  Min 3X/week    PT Plan Discharge plan needs to be updated    Co-evaluation             End of Session Equipment Utilized During Treatment: Gait belt Activity Tolerance: Patient limited by fatigue Patient left:  in chair;with call bell/phone within reach;with chair alarm set     Time: 1129-1200 PT Time Calculation (min) (ACUTE ONLY): 31 min  Charges:  $Gait Training: 8-22 mins $Therapeutic Activity: 8-22 mins                    G Codes:      Jacqualyn Posey 05/01/2015, 12:13 PM 05/01/2015 Jacqualyn Posey PTA [phone] pager [phone] office

## 2015-05-01 NOTE — Discharge Instructions (Signed)
Thank you for trusting [place] with your medical care!  You were hospitalized for acute cholecystitis and treated with percutaneous cholecystectomy, IV antibiotics, IV fluids.   Please take note of the following changes to your medications: CONTINUE Augmentin until you run out of medication  To make sure you are getting better, please make it to the follow-up appointments listed on the first page.  If you have any questions, please call [phone].

## 2015-05-01 NOTE — Progress Notes (Addendum)
Subjective: This morning, her daughter is at bedside. She denies any complaints. They are concerned about where she will be going following her hospital stay. We explained that her PT assessment, she would not qualify for insurance coverage for SNF though was informed by RN that Case Management and PT would reassess her today. We discussed transitioning her antibiotics.   Objective: Vital signs in last 24 hours: Filed Vitals:   04/30/15 2200 05/01/15 0521 05/01/15 0544 05/01/15 0956  BP: 144/77 144/76    Pulse: 79 78    Temp: 98.7 F (37.1 C) 99 F (37.2 C)    TempSrc: Oral Oral    Resp: 18     Height:      Weight:   221 lb 14.4 oz (100.653 kg)   SpO2: 97% 93%  96%   Weight change: -3 lb 4.8 oz (-1.497 kg)  Intake/Output Summary (Last 24 hours) at 05/01/15 1149 Last data filed at 05/01/15 0904  Gross per 24 hour  Intake    970 ml  Output     50 ml  Net    920 ml   Physical Exam: General: Elderly Caucasian female, resting in bed,  HEENT: PERRL, EOMI, no scleral icterus, oropharynx clear Cardiac: RRR, no rubs, murmurs or gallops Pulm: clear to auscultation bilaterally in the anterior lung fields, no wheezes, rales, or rhonchi Abd: Incision site covered with bandage clean, dry, intact, normoactive bowel sounds Ext: warm and well perfused, no pedal edema Neuro: responds to questions appropriately; moving all extremities freely   Lab Results: Basic Metabolic Panel:  Recent Labs Lab 04/26/15 0541 04/27/15 0617 04/28/15 0325  04/30/15 0422 05/01/15 0717  NA 136 135 138  < > 135 133*  K 3.5 3.0* 3.4*  < > 4.0 3.1*  CL 107 106 107  < > 100* 97*  CO2 21* 22 21*  < > 26 26  GLUCOSE 85 111* 173*  < > 111* 137*  BUN 17 24* 19  < > 9 8  CREATININE 0.79 0.86 0.64  < > 0.65 0.68  CALCIUM 8.7* 8.5* 9.1  < > 9.1 8.8*  MG 1.9 1.9 2.1  --   --   --   PHOS 3.2  --   --   --   --   --   < > = values in this interval not displayed. Liver Function Tests:  Recent Labs Lab  04/25/15 1506 04/27/15 0617  AST 25 16  ALT 15 13*  ALKPHOS 73 79  BILITOT 0.4 0.4  PROT 7.8 5.7*  ALBUMIN 3.6 2.3*    Recent Labs Lab 04/25/15 1506  LIPASE 16*   CBC:  Recent Labs Lab 04/25/15 1506 04/26/15 0541  04/29/15 0335 05/01/15 0340  WBC 18.6* 24.0*  < > 12.4* 16.1*  NEUTROABS 16.7* 20.4*  --   --   --   HGB 15.1* 13.0  < > 10.8* 13.0  HCT 44.1 38.4  < > 32.9* 36.8  MCV 98.0 98.2  < > 100.0 96.1  PLT 292 264  < > 239 PLATELET CLUMPS NOTED ON SMEAR, COUNT APPEARS ADEQUATE  < > = values in this interval not displayed. Cardiac Enzymes:  Recent Labs Lab 04/25/15 2347 04/26/15 0541 04/26/15 1110  TROPONINI 0.03 0.03 <0.03   CBG:  Recent Labs Lab 04/26/15 0557 04/28/15 0553 05/01/15 0703  GLUCAP 103* 132* 147*   Urinalysis:  Recent Labs Lab 04/25/15 Cutter 1.023  PHURINE 7.5  GLUCOSEU  NEGATIVE  HGBUR NEGATIVE  BILIRUBINUR SMALL*  KETONESUR NEGATIVE  PROTEINUR 30*  UROBILINOGEN 1.0  NITRITE NEGATIVE  LEUKOCYTESUR SMALL*   Studies/Results: No results found. Medications: I have reviewed the patient's current medications. Scheduled Meds: . famotidine  40 mg Oral QHS  . hydrochlorothiazide  12.5 mg Oral Daily  . mometasone-formoterol  2 puff Inhalation BID  . NIFEdipine  90 mg Oral Daily  . pantoprazole  20 mg Oral BID AC  . piperacillin-tazobactam (ZOSYN)  IV  3.375 g Intravenous 3 times per day  . sertraline  100 mg Oral BID  . tiotropium  18 mcg Inhalation Daily  . topiramate  50 mg Oral BID   Continuous Infusions:   PRN Meds:.acetaminophen **OR** acetaminophen, albuterol, ALPRAZolam, HYDROcodone-acetaminophen, meclizine, morphine injection, ondansetron **OR** ondansetron (ZOFRAN) IV, polyvinyl alcohol   Assessment/Plan: Ms. Elsasser is a 74 year old female with HTN, chronic dCHF, GERD, depression, anxiety, and chronic pain hospitalized for acute cholecystitis status post percutaneous cholecystostomy [day  5/7, week 1/6].    Acute cholecystitis s/p percutaneous cholecystostomy: Still improving. Blood cultures still negative to date, biliary fluid culture with polymicrobial flora but no Staphylococcus aureus. Patient to keep cholecystostomy tube for 6 weeks, then have cholecystectomy per surgery notes. PT recommended home health with intermittent supervision though daughter does not feel that her mother will be safe at home. -Continue home Norco 7.5/325 mg every 4 hours as needed -Continue Zofran 4 mg q6h prn -Continue Protonix 40 mg -Transition Zosyn to Augmentin [antibiotics day 6/7] and monitor as an inpatient for tolerance -Clarify with PT/OT  Atrial Flutter w/ Aberrancy: Telemetry review without significant findings. EF 60-65% with grade 1 diastolic dysfunction as noted on echo on this admission, stable from December 2011. -Discontinue telemetry  HTN: Blood pressure stable on home medications: HCTZ 12.5 mg, nifedipine 90 mg. -Continue home medications  Chronic dCHF: Echo findings as noted above. Her medications include HCTZ, nifedipine, and ASA 81 mg. -HCTZ, nifedipine as noted above  COPD: Stable.  -Continue Dulera, Spiriva  OSA: Stable. -Continue CPAP qhs  Depression/Anxiety/Chronic Pain: Stable. Still some concern for in-hospital delirium given age, pain, and narcotic medications.  -Continue Zoloft 100 mg bid -Continue Topamax 50 mg bid -Continue Xanax 0.5 mg qhs for now (takes differently at home).  #FEN:  -Diet:  Heart Healthy -K Dur 40 mEq 2 for potassium 3.1  #DVT prophylaxis: SCDs  #CODE STATUS: FULL CODE  Dispo: Disposition is deferred at this time, awaiting improvement of current medical problems. Currently monitoring her tolerance of oral contrast.  The patient does have a current PCP Bertha Stakes, MD) and does need an [facility] hospital follow-up appointment after discharge.  The patient does have transportation limitations that hinder transportation to clinic  appointments.  .Services Needed at time of discharge: Y = Yes, Blank = No PT:   OT:   RN:   Equipment:   Other:     LOS: 6 days   Riccardo Dubin, MD 05/01/2015, 11:49 AM

## 2015-05-01 NOTE — Progress Notes (Signed)
  Date: 05/01/2015  Patient name: Jennifer [name]  Medical record number: [id number]  Date of birth: 12/20/1940   This patient's plan of care was discussed with the house staff. Please see Dr. Serita Grit note for complete details. I concur with his findings.  [name] is much improved, however, concern for safety at home is valid.  PT will reassess today and we will follow their recommendations. Transition to PO Abx.   Sid Falcon, MD 05/01/2015, 1:44 PM

## 2015-05-02 LAB — CULTURE, BLOOD (ROUTINE X 2)
Culture: NO GROWTH
Culture: NO GROWTH

## 2015-05-02 LAB — BASIC METABOLIC PANEL
ANION GAP: 10 (ref 5–15)
BUN: 8 mg/dL (ref 6–20)
CHLORIDE: 98 mmol/L — AB (ref 101–111)
CO2: 26 mmol/L (ref 22–32)
Calcium: 9.2 mg/dL (ref 8.9–10.3)
Creatinine, Ser: 0.75 mg/dL (ref 0.44–1.00)
GFR calc Af Amer: >60 mL/min (ref >60)
Glucose, Bld: 109 mg/dL — ABNORMAL HIGH (ref 65–99)
POTASSIUM: 3.7 mmol/L (ref 3.5–5.1)
Sodium: 134 mmol/L — ABNORMAL LOW (ref 135–145)

## 2015-05-02 LAB — GLUCOSE, CAPILLARY: Glucose-Capillary: 111 mg/dL — ABNORMAL HIGH (ref 65–99)

## 2015-05-02 MED ORDER — ALPRAZOLAM 0.5 MG PO TABS: 0.5000 mg | ORAL_TABLET | Freq: Two times a day (BID) | ORAL | Status: DC | PRN

## 2015-05-02 MED ORDER — AMOXICILLIN-POT CLAVULANATE 875-125 MG PO TABS: 1.0000 | ORAL_TABLET | Freq: Two times a day (BID) | ORAL | Status: DC

## 2015-05-02 MED ORDER — AMOXICILLIN-POT CLAVULANATE 875-125 MG PO TABS
1.0000 | ORAL_TABLET | Freq: Two times a day (BID) | ORAL | Status: DC
  Administered 2015-05-02: 1 via ORAL
  Filled 2015-05-02 (×2): qty 1

## 2015-05-02 MED ORDER — HYDROCODONE-ACETAMINOPHEN 7.5-325 MG PO TABS: 1.0000 | ORAL_TABLET | Freq: Four times a day (QID) | ORAL | Status: DC | PRN

## 2015-05-02 NOTE — Progress Notes (Signed)
  Date: 05/02/2015  Patient name: Jennifer [name]  Medical record number: [id number]  Date of birth: Jun 29, 1941   This patient's plan of care was discussed with the house staff. Please see Dr. Serita Grit note for complete details. I concur with his findings.  Ms. Schuler is finishing up her antibiotics today.  CM/SW are evaluating for nursing home placement for improved function.  She is nearing discharge.     Sid Falcon, MD 05/02/2015, 12:02 PM

## 2015-05-02 NOTE — Progress Notes (Signed)
Subjective: This morning, her daughter is at bedside. She feels ready to leave the hospital.  Objective: Vital signs in last 24 hours: Filed Vitals:   05/01/15 2022 05/01/15 2156 05/02/15 0500 05/02/15 0835  BP: 154/72  123/68   Pulse: 79  69   Temp: 98.6 F (37 C)  98 F (36.7 C)   TempSrc: Oral  Axillary   Resp: 18  18   Height:      Weight:   220 lb 14.4 oz (100.2 kg)   SpO2: 94% 95% 97% 97%   Weight change: -1 lb (-0.453 kg)  Intake/Output Summary (Last 24 hours) at 05/02/15 1118 Last data filed at 05/02/15 0857  Gross per 24 hour  Intake    960 ml  Output     43 ml  Net    917 ml   Physical Exam [findings stable from 05/01/15]: General: Elderly Caucasian female, resting in bed,  HEENT: PERRL, EOMI, no scleral icterus, oropharynx clear Cardiac: RRR, no rubs, murmurs or gallops Pulm: clear to auscultation bilaterally in the anterior lung fields, no wheezes, rales, or rhonchi Abd: Incision site covered with bandage clean, dry, intact, normoactive bowel sounds Ext: warm and well perfused, no pedal edema Neuro: responds to questions appropriately; moving all extremities freely   Lab Results: Basic Metabolic Panel:  Recent Labs Lab 04/26/15 0541 04/27/15 0617 04/28/15 0325  05/01/15 0717 05/02/15 0445  NA 136 135 138  < > 133* 134*  K 3.5 3.0* 3.4*  < > 3.1* 3.7  CL 107 106 107  < > 97* 98*  CO2 21* 22 21*  < > 26 26  GLUCOSE 85 111* 173*  < > 137* 109*  BUN 17 24* 19  < > 8 8  CREATININE 0.79 0.86 0.64  < > 0.68 0.75  CALCIUM 8.7* 8.5* 9.1  < > 8.8* 9.2  MG 1.9 1.9 2.1  --   --   --   PHOS 3.2  --   --   --   --   --   < > = values in this interval not displayed. Liver Function Tests:  Recent Labs Lab 04/25/15 1506 04/27/15 0617  AST 25 16  ALT 15 13*  ALKPHOS 73 79  BILITOT 0.4 0.4  PROT 7.8 5.7*  ALBUMIN 3.6 2.3*    Recent Labs Lab 04/25/15 1506  LIPASE 16*   CBC:  Recent Labs Lab 04/25/15 1506 04/26/15 0541  04/29/15 0335  05/01/15 0340  WBC 18.6* 24.0*  < > 12.4* 16.1*  NEUTROABS 16.7* 20.4*  --   --   --   HGB 15.1* 13.0  < > 10.8* 13.0  HCT 44.1 38.4  < > 32.9* 36.8  MCV 98.0 98.2  < > 100.0 96.1  PLT 292 264  < > 239 PLATELET CLUMPS NOTED ON SMEAR, COUNT APPEARS ADEQUATE  < > = values in this interval not displayed. Cardiac Enzymes:  Recent Labs Lab 04/25/15 2347 04/26/15 0541 04/26/15 1110  TROPONINI 0.03 0.03 <0.03   CBG:  Recent Labs Lab 04/26/15 0557 04/28/15 0553 05/01/15 0703 05/02/15 0606  GLUCAP 103* 132* 147* 111*   Urinalysis:  Recent Labs Lab 04/25/15 1656  COLORURINE AMBER*  LABSPEC 1.023  PHURINE 7.5  GLUCOSEU NEGATIVE  HGBUR NEGATIVE  BILIRUBINUR SMALL*  KETONESUR NEGATIVE  PROTEINUR 30*  UROBILINOGEN 1.0  NITRITE NEGATIVE  LEUKOCYTESUR SMALL*   Studies/Results: No results found. Medications: I have reviewed the patient's current medications. Scheduled Meds: . famotidine  40 mg Oral QHS  . hydrochlorothiazide  12.5 mg Oral Daily  . mometasone-formoterol  2 puff Inhalation BID  . NIFEdipine  90 mg Oral Daily  . pantoprazole  20 mg Oral BID AC  . piperacillin-tazobactam (ZOSYN)  IV  3.375 g Intravenous 3 times per day  . potassium chloride  40 mEq Oral BID  . sertraline  100 mg Oral BID  . tiotropium  18 mcg Inhalation Daily  . topiramate  50 mg Oral BID   Continuous Infusions:   PRN Meds:.acetaminophen **OR** acetaminophen, albuterol, ALPRAZolam, HYDROcodone-acetaminophen, meclizine, ondansetron **OR** ondansetron (ZOFRAN) IV, polyvinyl alcohol   Assessment/Plan: Ms. Salmons is a 74 year old female with HTN, chronic dCHF, GERD, depression, anxiety, and chronic pain hospitalized for acute cholecystitis status post percutaneous cholecystostomy [day 6/7, week 1/6].    Acute cholecystitis s/p percutaneous cholecystostomy: Still improving. Blood cultures still negative to date, biliary fluid culture with polymicrobial flora but no Staphylococcus aureus. PT  recommended SNF placement yesterday. -Continue home Norco 7.5/325 mg every 4 hours as needed -Continue Zofran 4 mg q6h prn -Continue Protonix 40 mg -Finish Augmentin [antibiotics day 7/7] today  Atrial Flutter w/ Aberrancy: Telemetry review without significant findings. EF 60-65% with grade 1 diastolic dysfunction as noted on echo on this admission, stable from December 2011. -Reassess at the time of follow-up  HTN: Blood pressure stable on home medications: HCTZ 12.5 mg, nifedipine 90 mg. -Continue home medications  Chronic dCHF: Echo findings as noted above. Her medications include HCTZ, nifedipine, and ASA 81 mg. -HCTZ, nifedipine as noted above  COPD: Stable.  -Continue Dulera, Spiriva  OSA: Stable. -Continue CPAP qhs  Depression/Anxiety/Chronic Pain: Stable. Still some concern for in-hospital delirium given age, pain, and narcotic medications.  -Continue Zoloft 100 mg bid -Continue Topamax 50 mg bid -Continue Xanax 0.5 mg qhs for now (takes differently at home).  #FEN:  -Diet:  Heart Healthy -Resume KDur at discharge  #DVT prophylaxis: SCDs  #CODE STATUS: FULL CODE  Dispo: Disposition is deferred at this time, awaiting improvement of current medical problems. Currently monitoring her tolerance of oral contrast.  The patient does have a current PCP Bertha Stakes, MD) and does need an [facility] hospital follow-up appointment after discharge.  The patient does have transportation limitations that hinder transportation to clinic appointments.  .Services Needed at time of discharge: Y = Yes, Blank = No PT:   OT:   RN:   Equipment:   Other:     LOS: 7 days   Riccardo Dubin, MD 05/02/2015, 11:18 AM

## 2015-05-02 NOTE — Progress Notes (Signed)
Pt is being discharged to [facility]. Report was called to [place] at [facility]. SBAR was used to give report. Opportunity to ask questions was given. EMS will be transporting the patient to the facility. Will get the patient ready for discharge.

## 2015-05-03 NOTE — Clinical Social Work Placement (Signed)
   CLINICAL SOCIAL WORK PLACEMENT  NOTE  Date:  05/02/2015 Patient Details  Name: YARESLY MENZEL MRN: [id number] Date of Birth: 02-14-1941  Clinical Social Work is seeking post-discharge placement for this patient at the [place] level of care (*CSW will initial, date and re-position this form in  chart as items are completed):  Yes   Patient/family provided with [facility] Work Department's list of facilities offering this level of care within the geographic area requested by the patient (or if unable, by the patient's family).  Yes   Patient/family informed of their freedom to choose among providers that offer the needed level of care, that participate in Medicare, Medicaid or managed care program needed by the patient, have an available bed and are willing to accept the patient.  Yes   Patient/family informed of [facility]'s ownership interest in [facility] and [facility], as well as of the fact that they are under no obligation to receive care at these facilities.  PASRR submitted to EDS on 04/30/15     PASRR number received on 04/30/15     Existing PASRR number confirmed on       FL2 transmitted to all facilities in geographic area requested by pt/family on       FL2 transmitted to all facilities within larger geographic area on 04/30/15     Patient informed that his/her managed care company has contracts with or will negotiate with certain facilities, including the following:   [employer]- requires pre-authorization)     Yes   Patient/family informed of bed offers received.  Patient chooses bed at [facility]     Physician recommends and patient chooses bed at      Patient to be transferred to [facility] on 05/02/15.  Patient to be transferred to facility by [facility] and rescue   Patient family notified on 05/02/15 of transfer.  Name of family member  notified:  Daughter- Lars Pinks     PHYSICIAN Please sign FL2, Please prepare prescriptions, Please prepare priority discharge summary, including medications     Additional Comment: 05/02/15  Ok per MD for d/c today to SNF for short term SNF.  Patient and daughter are agreeable to d/c. [facility] is in place per Gayla Medicus, International aid/development worker for [employer]. Nursing notified to call report; patient and daughter denied any questions or concerns. Daughter states that she is continuing tos eek housing for herself and her mother as their current arrangement - living with her sister is not working out.  We discussed looking at ads in the paper for rental propertes as well as talking to rental agencies.  Daughter has also extensively investigated Section 8 housing and is aware of long list for housing units.  She is worried as she still trying to get her disablity completed and funds are very tight.  Lorie Phenix. Murrell Redden [phone]      _______________________________________________ Williemae Area, LCSW 05/03/2015, 12:25 PM

## 2015-05-03 NOTE — Clinical Social Work Placement (Signed)
   CLINICAL SOCIAL WORK PLACEMENT  NOTE  Date:  04/30/15 Patient Details  Name: CADDIE RANDLE MRN: [id number] Date of Birth: 1941/09/05  Clinical Social Work is seeking post-discharge placement for this patient at the [place] level of care (*CSW will initial, date and re-position this form in  chart as items are completed):  Yes   Patient/family provided with [facility] Work Department's list of facilities offering this level of care within the geographic area requested by the patient (or if unable, by the patient's family).  Yes   Patient/family informed of their freedom to choose among providers that offer the needed level of care, that participate in Medicare, Medicaid or managed care program needed by the patient, have an available bed and are willing to accept the patient.  Yes   Patient/family informed of [facility]'s ownership interest in [facility] and [facility], as well as of the fact that they are under no obligation to receive care at these facilities.  PASRR submitted to EDS on 04/30/15     PASRR number received on 04/30/15     Existing PASRR number confirmed on       FL2 transmitted to all facilities in geographic area requested by pt/family on       FL2 transmitted to all facilities within larger geographic area on       Patient informed that his/her managed care company has contracts with or will negotiate with certain facilities, including the following:   [employer]- requires pre-authorization)         Patient/family informed of bed offers received.  Patient chooses bed at       Physician recommends and patient chooses bed at      Patient to be transferred to   on  .  Patient to be transferred to facility by       Patient family notified on   of transfer.  Name of family member notified:        PHYSICIAN Please sign FL2, Please prepare prescriptions, Please prepare priority discharge summary, including  medications     Additional Comment:    _______________________________________________ Williemae Area, LCSW 05/03/2015, 12:22 PM

## 2015-05-04 ENCOUNTER — Other Ambulatory Visit: Payer: Self-pay | Admitting: Internal Medicine

## 2015-05-04 DIAGNOSIS — K219 Gastro-esophageal reflux disease without esophagitis: Secondary | ICD-10-CM

## 2015-05-06 ENCOUNTER — Non-Acute Institutional Stay (SKILLED_NURSING_FACILITY): Payer: Medicare HMO | Admitting: Internal Medicine

## 2015-05-06 ENCOUNTER — Encounter: Payer: Self-pay | Admitting: Internal Medicine

## 2015-05-06 DIAGNOSIS — G4733 Obstructive sleep apnea (adult) (pediatric): Secondary | ICD-10-CM

## 2015-05-06 DIAGNOSIS — J439 Emphysema, unspecified: Secondary | ICD-10-CM

## 2015-05-06 DIAGNOSIS — F418 Other specified anxiety disorders: Secondary | ICD-10-CM

## 2015-05-06 DIAGNOSIS — F419 Anxiety disorder, unspecified: Secondary | ICD-10-CM

## 2015-05-06 DIAGNOSIS — G4700 Insomnia, unspecified: Secondary | ICD-10-CM | POA: Diagnosis not present

## 2015-05-06 DIAGNOSIS — I5032 Chronic diastolic (congestive) heart failure: Secondary | ICD-10-CM

## 2015-05-06 DIAGNOSIS — F32A Depression, unspecified: Secondary | ICD-10-CM

## 2015-05-06 DIAGNOSIS — M797 Fibromyalgia: Secondary | ICD-10-CM | POA: Diagnosis not present

## 2015-05-06 DIAGNOSIS — I4892 Unspecified atrial flutter: Secondary | ICD-10-CM | POA: Diagnosis not present

## 2015-05-06 DIAGNOSIS — Z434 Encounter for attention to other artificial openings of digestive tract: Secondary | ICD-10-CM

## 2015-05-06 DIAGNOSIS — K810 Acute cholecystitis: Secondary | ICD-10-CM

## 2015-05-06 DIAGNOSIS — F329 Major depressive disorder, single episode, unspecified: Secondary | ICD-10-CM

## 2015-05-06 NOTE — Progress Notes (Signed)
Patient ID: Jennifer [name], female   DOB: Sep 20, 1941, 74 y.o.   MRN: [id number]    HISTORY AND PHYSICAL   DATE: 05/06/15  Location:  [facility]    Place of Service: SNF [phone])   Extended Emergency Contact Information Primary Emergency Contact: Jennifer [name] Address: [address]          [place] Phone: [phone] Mobile Phone: [phone] Relation: Daughter  Advanced Directive information  FULL CODE  Chief Complaint  Patient presents with  . New Admit To SNF    HPI:  74 yo female seen today as a new admission into SNF following hospital stay for acute cholecystitis s/p T tube. She is not a surgical candidate at this point due to comorbid illnesses. She dc'd with Augmentin 875 to take indefinitely. Her daughter is present. She is c/a pt's meds. Pt reports not sleeping well. She was previously on elevil 55m qHS. She has increased anxiety and would like to make sure she is taking her med. Current dose is alprazolam 0.565m1.5 tabs TID prn. She was taking med BID prn and qhs at home. She would also like rx for nystatin cream due to rash under her arms. She would like to make sure she is taking SOMA daily prn. Pt is a poor historian due to anxiety/depression. Hx obtained from daughter and chart  COPD/OSA - stable on CPAP qhs, advair BIDproair HFA prn, spiriva daily and mucinex dm  Aflutter/HTN/CHF -  Rate controlled on nifedipine er. She takes ASA daily. BP controlled on HCTZ  FMS/chronic pain - stable on norco prn and soma prn. Takes tramadol for migraine HA  GERD - stable on omeprazole and zantac  Insomnia/anxiety/depression - takes amitriptyline to help sleep. Sertraline helps mood  She also takes several supplements and vitamins.  Past Medical History  Diagnosis Date  . COPD (chronic obstructive pulmonary disease)     O2 dependent. Followed by Dr. WrJoya Gaskins. Fibromyalgia   . Hypertension   . OSA (obstructive  sleep apnea)     Sleep study done April 02, 2008 showed severe obstructive sleep apnea.  . Diastolic dysfunction     Grade I diastolic dysfunction as shown on ECHO Dec 2011.  EF of 65-70%.  . Marland KitchenOPD (chronic obstructive pulmonary disease)     Followed by Dr. WrJoya Gaskins. Dyslipidemia   . Osteopenia     DXA scan done on 10/23/2009 showed osteopenia of AP spine (young adult T-score -1.3), osteopenia of left femur neck (young adult T-score -1.8), and normal bone density of right femur neck (young adult T-score -0.8), with a FRAX estimate of the 10-year probability of a major osteoporotic fracture of 9.7% and probability of hip fracture 1.6%.  . 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 DeEstanislado Pandyn 11/10/2004.  MRA of the head on 03/09/2012 showed no evidence for recanalization.  . Osteoarthrosis involving multiple sites   . Chronic back pain   . Depression   . Anxiety   . Anemia   . GERD (gastroesophageal reflux disease)   . Meniere disease     Followed by ENT Dr. SeSilvestre [name]. Diverticulosis of colon   . Multiple pigmented nevi   . Hypokalemia   . S/P hysterectomy 1975  . Pneumonia 11/2004  . Knee pain   . Dysphagia   . Urosepsis 11/04/2010    Klebsiella pneumoniae  . Breast  pain   . Allergic rhinitis   . Fibromyalgia   . 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     . Urinary incontinence     Past Surgical History  Procedure Laterality Date  . Abdominal hysterectomy  1975  . 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 Estanislado [name] on 11/10/2004.    Patient Care Team: Jennifer Stakes, MD as PCP - General Jennifer Stain, MD (Pulmonary Disease)  History   Social History  . Marital Status: Widowed    Spouse Name: N/A  . Number of Children: N/A  . Years of Education: N/A   Occupational History  . Not on file.   Social History Main Topics  . Smoking status: Former Smoker -- 0.50 packs/day for 30 years    Types: Cigarettes    Quit date: 12/07/2008  . Smokeless tobacco: Former Systems developer    Types: [place] date: 12/06/1978  . Alcohol Use: No  . Drug Use: No  . Sexual Activity: No   Other Topics Concern  . Not on file   Social History Narrative   Financial assistance approved for 100% discount after Medicare pays for [facility] only, not eligible for [facility] card. Per Bonna Gains     reports that she quit smoking about 6 years ago. Her smoking use included Cigarettes. She has a 15 pack-year smoking history. She quit smokeless tobacco use about 36 years ago. Her smokeless tobacco use included Chew. She reports that she does not drink alcohol or use illicit drugs.  Family History  Problem Relation Age of Onset  . Ulcers Mother   . Ovarian cancer Sister 40  . Cervical cancer Daughter   . Cervical cancer Daughter   . Lung cancer Neg Hx   . Colon cancer Neg Hx   . Heart attack Father   . Cerebral aneurysm Sister 45   Family Status  Relation Status Death Age  . Mother Deceased 8  . Sister Deceased Age 56  . Daughter Alive   . Daughter Alive   . Father Deceased 33s    Died of heart attack  . Brother Marketing executive History  Administered Date(s) Administered  . H1N1 11/20/2008  . Influenza Split 10/08/2011, 08/22/2012  . Influenza Whole 10/06/2005, 10/06/2006, 09/12/2008, 09/02/2009, 10/28/2010  . Influenza,inj,Quad PF,36+ Mos 08/15/2013, 10/14/2014  . Pneumococcal Conjugate-13 12/12/2014  . Pneumococcal Polysaccharide-23 09/27/2002, 11/12/2009  . Td 11/18/2010  . Zoster 09/07/2012    Allergies  Allergen Reactions  . Flonase [Fluticasone] Cough  . Lisinopril Other (See  Comments)    Cough  . Tetracycline     REACTION: stomach upset    Medications: Patient's Medications  New Prescriptions   No medications on file  Previous Medications   ALBUTEROL (PROAIR HFA) 108 (90 BASE) MCG/ACT INHALER    INHALE 2 PUFFS BY MOUTH INTO LUNGS FOUR TIMES DAILY AS NEEDED   ALPRAZOLAM (XANAX) 0.5 MG TABLET    Take 1 tablet (0.5 mg total) by mouth 2 (two) times daily as needed for anxiety.   AMITRIPTYLINE (ELAVIL) 50 MG TABLET    Take 1 tablet (50 mg total) by mouth at bedtime.   AMOXICILLIN-CLAVULANATE (AUGMENTIN) 875-125 MG PER TABLET  Take 1 tablet by mouth every 12 (twelve) hours.   ASPIRIN 81 MG EC TABLET    Take 81 mg by mouth daily.     CARISOPRODOL (SOMA) 350 MG TABLET    Take 1 tablet (350 mg total) by mouth daily as needed for muscle spasms.   CHOLECALCIFEROL (VITAMIN D3) 1000 UNITS CAPS    Take 1 capsule (1,000 Units total) by mouth daily.   CINNAMON PO    Take by mouth 2 (two) times daily.   DEXTROMETHORPHAN-GUAIFENESIN (MUCINEX DM) 30-600 MG PER 12 HR TABLET    Take 1 tablet by mouth 2 (two) times daily.   DOCUSATE SODIUM (COLACE) 100 MG CAPSULE    Take 100 mg by mouth daily as needed for mild constipation or moderate constipation.   FLUTICASONE-SALMETEROL (ADVAIR DISKUS) 250-50 MCG/DOSE AEPB    Inhale 1 puff into the lungs 2 (two) times daily.   HYDROCHLOROTHIAZIDE (HYDRODIURIL) 25 MG TABLET    Take 0.5 tablets (12.5 mg total) by mouth daily.   HYDROCODONE-ACETAMINOPHEN (NORCO) 7.5-325 MG PER TABLET    Take 1 tablet by mouth 4 (four) times daily as needed for moderate pain or severe pain.   HYDROXYPROPYL METHYLCELLULOSE (ISOPTO TEARS) 2.5 % OPHTHALMIC SOLUTION    Place 2 drops into both eyes 3 (three) times daily as needed. Dry eye   IBUPROFEN (ADVIL,MOTRIN) 200 MG TABLET    Take 200 mg by mouth 4 (four) times daily as needed for moderate pain. For pain    MECLIZINE (ANTIVERT) 12.5 MG TABLET    Take 1 tablet (12.5 mg total) by mouth 2 (two) times daily as  needed for dizziness.   MULTIPLE VITAMIN (MULTIVITAMIN) TABLET    Take 1 tablet by mouth daily.   NIFEDIPINE (ADALAT CC) 90 MG 24 HR TABLET    Take 1 tablet (90 mg total) by mouth daily.   OMEPRAZOLE (PRILOSEC) 20 MG CAPSULE    Take 1 capsule (20 mg total) by mouth 2 (two) times daily.   POTASSIUM CHLORIDE (K-DUR) 10 MEQ TABLET    Take 2 tablets (20 mEq total) by mouth 2 (two) times daily.   RANITIDINE (ZANTAC) 150 MG TABLET    Take 1 tablet (150 mg total) by mouth at bedtime.   RED YEAST RICE EXTRACT (RED YEAST RICE PO)    Take by mouth 2 (two) times daily.   SERTRALINE (ZOLOFT) 100 MG TABLET    Take 1 tablet (100 mg total) by mouth 2 (two) times daily.   TIOTROPIUM (SPIRIVA HANDIHALER) 18 MCG INHALATION CAPSULE    Place 1 capsule (18 mcg total) into inhaler and inhale daily.   TOPIRAMATE (TOPAMAX) 50 MG TABLET    Take 1 tablet (50 mg total) by mouth 2 (two) times daily.   TRIAMCINOLONE CREAM (KENALOG) 0.1 %    APPLY TOPICALLY TO THE AFFECTED AREA OF HAND TWICE DAILY AS NEEDED  Modified Medications   No medications on file  Discontinued Medications   No medications on file    Review of Systems  Unable to perform ROS: Psychiatric disorder    Filed Vitals:   05/06/15 1759  BP: 125/71  Pulse: 90  Temp: 97.5 F (36.4 C)  Weight: 221 lb (100.245 kg)   Body mass index is 39.16 kg/(m^2).  Physical Exam  Constitutional: She appears well-developed and well-nourished. No distress.  Lying in bed in NAD. Daughter present  HENT:  Mouth/Throat: Oropharynx is clear and moist. No oropharyngeal exudate.  Eyes: Pupils are equal, round, and reactive to  light. No scleral icterus.  Neck: Neck supple. Carotid bruit is not present. No tracheal deviation present. No thyromegaly present.  Cardiovascular: Normal rate, regular rhythm, normal heart sounds and intact distal pulses.  Exam reveals no gallop and no friction rub.   No murmur heard. No LE edema b/l. no calf TTP.   Pulmonary/Chest: Effort  normal and breath sounds normal. No stridor. No respiratory distress. She has no wheezes. She has no rales.  Abdominal: Soft. Bowel sounds are normal. She exhibits no distension and no mass. There is no hepatomegaly. There is no tenderness. There is no rebound and no guarding.  RUQ T tube intact DTG clear yellow fluid  Musculoskeletal: She exhibits edema and tenderness.  Lymphadenopathy:    She has no cervical adenopathy.  Neurological: She is alert.  Skin: Skin is warm and dry. Rash (b/l axillary wet angry red appearing rash. no vesicles) noted.  Psychiatric: She has a normal mood and affect. Her behavior is normal.     Labs reviewed: Admission on 04/25/2015, Discharged on 05/02/2015  No results displayed because visit has over 200 results.     Recent Results (from the past 2160 hour(s))  CBC with Differential     Status: Abnormal   Collection Time: 04/25/15  3:06 PM  Result Value Ref Range   WBC 18.6 (H) 4.0 - 10.5 K/uL   RBC 4.50 3.87 - 5.11 MIL/uL   Hemoglobin 15.1 (H) 12.0 - 15.0 g/dL   HCT 44.1 36.0 - 46.0 %   MCV 98.0 78.0 - 100.0 fL   MCH 33.6 26.0 - 34.0 pg   MCHC 34.2 30.0 - 36.0 g/dL   RDW 12.7 11.5 - 15.5 %   Platelets 292 150 - 400 K/uL   Neutrophils Relative % 90 (H) 43 - 77 %   Lymphocytes Relative 3 (L) 12 - 46 %   Monocytes Relative 7 3 - 12 %   Eosinophils Relative 0 0 - 5 %   Basophils Relative 0 0 - 1 %   Neutro Abs 16.7 (H) 1.7 - 7.7 K/uL   Lymphs Abs 0.6 (L) 0.7 - 4.0 K/uL   Monocytes Absolute 1.3 (H) 0.1 - 1.0 K/uL   Eosinophils Absolute 0.0 0.0 - 0.7 K/uL   Basophils Absolute 0.0 0.0 - 0.1 K/uL   RBC Morphology POLYCHROMASIA PRESENT    WBC Morphology MILD LEFT SHIFT (1-5% METAS, OCC MYELO, OCC BANDS)     Comment: TOXIC GRANULATION  Comprehensive metabolic panel     Status: Abnormal   Collection Time: 04/25/15  3:06 PM  Result Value Ref Range   Sodium 136 135 - 145 mmol/L   Potassium 3.0 (L) 3.5 - 5.1 mmol/L   Chloride 105 101 - 111 mmol/L   CO2  18 (L) 22 - 32 mmol/L   Glucose, Bld 165 (H) 65 - 99 mg/dL   BUN 12 6 - 20 mg/dL   Creatinine, Ser 0.81 0.44 - 1.00 mg/dL   Calcium 9.9 8.9 - 10.3 mg/dL   Total Protein 7.8 6.5 - 8.1 g/dL   Albumin 3.6 3.5 - 5.0 g/dL   AST 25 15 - 41 U/L   ALT 15 14 - 54 U/L   Alkaline Phosphatase 73 38 - 126 U/L   Total Bilirubin 0.4 0.3 - 1.2 mg/dL   GFR calc non Af Amer >60 >60 mL/min   GFR calc Af Amer >60 >60 mL/min    Comment: (NOTE) The eGFR has been calculated using the CKD EPI equation. This  calculation has not been validated in all clinical situations. eGFR's persistently <60 mL/min signify possible Chronic Kidney Disease.    Anion gap 13 5 - 15  Lipase, blood     Status: Abnormal   Collection Time: 04/25/15  3:06 PM  Result Value Ref Range   Lipase 16 (L) 22 - 51 U/L  I-stat troponin, ED (only if pt is 76 y.o. or older & pain is above umbilicus)  not at [facility], [facility]     Status: None   Collection Time: 04/25/15  3:12 PM  Result Value Ref Range   Troponin i, poc 0.01 0.00 - 0.08 ng/mL   Comment 3            Comment: Due to the release kinetics of cTnI, a negative result within the first hours of the onset of symptoms does not rule out myocardial infarction with certainty. If myocardial infarction is still suspected, repeat the test at appropriate intervals.   I-Stat CG4 Lactic Acid, ED     Status: Abnormal   Collection Time: 04/25/15  3:14 PM  Result Value Ref Range   Lactic Acid, Venous 3.78 (HH) 0.5 - 2.0 mmol/L   Comment NOTIFIED PHYSICIAN   Urinalysis, Routine w reflex microscopic     Status: Abnormal   Collection Time: 04/25/15  4:56 PM  Result Value Ref Range   Color, Urine AMBER (A) YELLOW    Comment: BIOCHEMICALS MAY BE AFFECTED BY COLOR   APPearance CLEAR CLEAR   Specific Gravity, Urine 1.023 1.005 - 1.030   pH 7.5 5.0 - 8.0   Glucose, UA NEGATIVE NEGATIVE mg/dL   Hgb urine dipstick NEGATIVE NEGATIVE   Bilirubin Urine SMALL (A) NEGATIVE   Ketones, ur NEGATIVE  NEGATIVE mg/dL   Protein, ur 30 (A) NEGATIVE mg/dL   Urobilinogen, UA 1.0 0.0 - 1.0 mg/dL   Nitrite NEGATIVE NEGATIVE   Leukocytes, UA SMALL (A) NEGATIVE  Urine culture     Status: None   Collection Time: 04/25/15  4:56 PM  Result Value Ref Range   Specimen Description URINE, RANDOM    Special Requests NONE    Colony Count NO GROWTH Performed at [facility]     Culture NO GROWTH Performed at [facility]     Report Status 04/27/2015 FINAL   Urine microscopic-add on     Status: None   Collection Time: 04/25/15  4:56 PM  Result Value Ref Range   Squamous Epithelial / LPF RARE RARE   WBC, UA 3-6 <3 WBC/hpf   RBC / HPF 0-2 <3 RBC/hpf   Bacteria, UA RARE RARE  I-Stat CG4 Lactic Acid, ED     Status: Abnormal   Collection Time: 04/25/15  7:02 PM  Result Value Ref Range   Lactic Acid, Venous 2.09 (HH) 0.5 - 2.0 mmol/L   Comment NOTIFIED PHYSICIAN   Troponin I (q 6hr x 3)     Status: None   Collection Time: 04/25/15 11:47 PM  Result Value Ref Range   Troponin I 0.03 <0.031 ng/mL    Comment:        NO INDICATION OF MYOCARDIAL INJURY.   Culture, blood (routine x 2)     Status: None   Collection Time: 04/26/15 12:00 AM  Result Value Ref Range   Specimen Description BLOOD LEFT ANTECUBITAL    Special Requests BOTTLES DRAWN AEROBIC AND ANAEROBIC 10CC EA    Culture      NO GROWTH 5 DAYS Performed at [facility]  Report Status 05/02/2015 FINAL   Lactic acid, plasma     Status: None   Collection Time: 04/26/15 12:00 AM  Result Value Ref Range   Lactic Acid, Venous 1.5 0.5 - 2.0 mmol/L  Magnesium     Status: Abnormal   Collection Time: 04/26/15 12:00 AM  Result Value Ref Range   Magnesium 1.4 (L) 1.7 - 2.4 mg/dL  Basic metabolic panel     Status: Abnormal   Collection Time: 04/26/15 12:00 AM  Result Value Ref Range   Sodium 135 135 - 145 mmol/L   Potassium 3.0 (L) 3.5 - 5.1 mmol/L   Chloride 104 101 - 111 mmol/L   CO2 20 (L) 22 - 32 mmol/L    Glucose, Bld 106 (H) 65 - 99 mg/dL   BUN 14 6 - 20 mg/dL   Creatinine, Ser 0.70 0.44 - 1.00 mg/dL   Calcium 9.0 8.9 - 10.3 mg/dL   GFR calc non Af Amer >60 >60 mL/min   GFR calc Af Amer >60 >60 mL/min    Comment: (NOTE) The eGFR has been calculated using the CKD EPI equation. This calculation has not been validated in all clinical situations. eGFR's persistently <60 mL/min signify possible Chronic Kidney Disease.    Anion gap 11 5 - 15  Culture, blood (routine x 2)     Status: None   Collection Time: 04/26/15 12:10 AM  Result Value Ref Range   Specimen Description BLOOD LEFT HAND    Special Requests BOTTLES DRAWN AEROBIC ONLY 8CC    Culture      NO GROWTH 5 DAYS Performed at [facility]    Report Status 05/02/2015 FINAL   CBC with Differential/Platelet     Status: Abnormal   Collection Time: 04/26/15  5:41 AM  Result Value Ref Range   WBC 24.0 (H) 4.0 - 10.5 K/uL   RBC 3.91 3.87 - 5.11 MIL/uL   Hemoglobin 13.0 12.0 - 15.0 g/dL   HCT 38.4 36.0 - 46.0 %   MCV 98.2 78.0 - 100.0 fL   MCH 33.2 26.0 - 34.0 pg   MCHC 33.9 30.0 - 36.0 g/dL   RDW 13.2 11.5 - 15.5 %   Platelets 264 150 - 400 K/uL   Neutrophils Relative % 85 (H) 43 - 77 %   Lymphocytes Relative 8 (L) 12 - 46 %   Monocytes Relative 7 3 - 12 %   Eosinophils Relative 0 0 - 5 %   Basophils Relative 0 0 - 1 %   Neutro Abs 20.4 (H) 1.7 - 7.7 K/uL   Lymphs Abs 1.9 0.7 - 4.0 K/uL   Monocytes Absolute 1.7 (H) 0.1 - 1.0 K/uL   Eosinophils Absolute 0.0 0.0 - 0.7 K/uL   Basophils Absolute 0.0 0.0 - 0.1 K/uL   RBC Morphology POLYCHROMASIA PRESENT    WBC Morphology MILD LEFT SHIFT (1-5% METAS, OCC MYELO, OCC BANDS)     Comment: TOXIC GRANULATION  Magnesium     Status: None   Collection Time: 04/26/15  5:41 AM  Result Value Ref Range   Magnesium 1.9 1.7 - 2.4 mg/dL  Troponin I (q 6hr x 3)     Status: None   Collection Time: 04/26/15  5:41 AM  Result Value Ref Range   Troponin I 0.03 <0.031 ng/mL    Comment:         NO INDICATION OF MYOCARDIAL INJURY.   Basic metabolic panel     Status: Abnormal   Collection Time: 04/26/15  5:41 AM  Result Value Ref Range   Sodium 136 135 - 145 mmol/L   Potassium 3.5 3.5 - 5.1 mmol/L   Chloride 107 101 - 111 mmol/L   CO2 21 (L) 22 - 32 mmol/L   Glucose, Bld 85 65 - 99 mg/dL   BUN 17 6 - 20 mg/dL   Creatinine, Ser 0.79 0.44 - 1.00 mg/dL   Calcium 8.7 (L) 8.9 - 10.3 mg/dL   GFR calc non Af Amer >60 >60 mL/min   GFR calc Af Amer >60 >60 mL/min    Comment: (NOTE) The eGFR has been calculated using the CKD EPI equation. This calculation has not been validated in all clinical situations. eGFR's persistently <60 mL/min signify possible Chronic Kidney Disease.    Anion gap 8 5 - 15  Phosphorus     Status: None   Collection Time: 04/26/15  5:41 AM  Result Value Ref Range   Phosphorus 3.2 2.5 - 4.6 mg/dL  Glucose, capillary     Status: Abnormal   Collection Time: 04/26/15  5:57 AM  Result Value Ref Range   Glucose-Capillary 103 (H) 65 - 99 mg/dL  Troponin I (q 6hr x 3)     Status: None   Collection Time: 04/26/15 11:10 AM  Result Value Ref Range   Troponin I <0.03 <0.031 ng/mL    Comment:        NO INDICATION OF MYOCARDIAL INJURY.   APTT     Status: Abnormal   Collection Time: 04/26/15 11:10 AM  Result Value Ref Range   aPTT 42 (H) 24 - 37 seconds    Comment:        IF BASELINE aPTT IS ELEVATED, SUGGEST PATIENT RISK ASSESSMENT BE USED TO DETERMINE APPROPRIATE ANTICOAGULANT THERAPY.   Protime-INR     Status: Abnormal   Collection Time: 04/26/15 11:10 AM  Result Value Ref Range   Prothrombin Time 19.4 (H) 11.6 - 15.2 seconds   INR 1.63 (H) 0.00 - 1.49  Body fluid culture     Status: None   Collection Time: 04/26/15  6:13 PM  Result Value Ref Range   Specimen Description GALL BLADDER    Special Requests NONE    Gram [name]      ABUNDANT WBC PRESENT, PREDOMINANTLY PMN ABUNDANT GRAM POSITIVE RODS MODERATE GRAM NEGATIVE RODS Gram [name] Report  Called to,Read Back By and Verified With: Gram [name] Report Called to,Read Back By and Verified With: Elie Goody RN on 04/27/15 at 03:10 by Rise Mu Performed at [facility], NONE PREDOMINANT Note: NO STAPHYLOCOCCUS AUREUS ISOLATED NO GROUP A STREP (S.PYOGENES) ISOLATED Performed at [facility]    Report Status 04/30/2015 FINAL   CBC     Status: Abnormal   Collection Time: 04/27/15  6:17 AM  Result Value Ref Range   WBC 16.7 (H) 4.0 - 10.5 K/uL   RBC 3.47 (L) 3.87 - 5.11 MIL/uL   Hemoglobin 11.4 (L) 12.0 - 15.0 g/dL   HCT 33.9 (L) 36.0 - 46.0 %   MCV 97.7 78.0 - 100.0 fL   MCH 32.9 26.0 - 34.0 pg   MCHC 33.6 30.0 - 36.0 g/dL   RDW 13.5 11.5 - 15.5 %   Platelets 237 150 - 400 K/uL  Comprehensive metabolic panel     Status: Abnormal   Collection Time: 04/27/15  6:17 AM  Result Value Ref Range   Sodium 135 135 - 145  mmol/L   Potassium 3.0 (L) 3.5 - 5.1 mmol/L   Chloride 106 101 - 111 mmol/L   CO2 22 22 - 32 mmol/L   Glucose, Bld 111 (H) 65 - 99 mg/dL   BUN 24 (H) 6 - 20 mg/dL   Creatinine, Ser 0.86 0.44 - 1.00 mg/dL   Calcium 8.5 (L) 8.9 - 10.3 mg/dL   Total Protein 5.7 (L) 6.5 - 8.1 g/dL   Albumin 2.3 (L) 3.5 - 5.0 g/dL   AST 16 15 - 41 U/L   ALT 13 (L) 14 - 54 U/L   Alkaline Phosphatase 79 38 - 126 U/L   Total Bilirubin 0.4 0.3 - 1.2 mg/dL   GFR calc non Af Amer >60 >60 mL/min   GFR calc Af Amer >60 >60 mL/min    Comment: (NOTE) The eGFR has been calculated using the CKD EPI equation. This calculation has not been validated in all clinical situations. eGFR's persistently <60 mL/min signify possible Chronic Kidney Disease.    Anion gap 7 5 - 15  Magnesium     Status: None   Collection Time: 04/27/15  6:17 AM  Result Value Ref Range   Magnesium 1.9 1.7 - 2.4 mg/dL  Basic metabolic panel     Status: Abnormal   Collection Time: 04/28/15  3:25 AM  Result Value Ref Range   Sodium 138 135 - 145 mmol/L    Potassium 3.4 (L) 3.5 - 5.1 mmol/L   Chloride 107 101 - 111 mmol/L   CO2 21 (L) 22 - 32 mmol/L   Glucose, Bld 173 (H) 65 - 99 mg/dL   BUN 19 6 - 20 mg/dL   Creatinine, Ser 0.64 0.44 - 1.00 mg/dL   Calcium 9.1 8.9 - 10.3 mg/dL   GFR calc non Af Amer >60 >60 mL/min   GFR calc Af Amer >60 >60 mL/min    Comment: (NOTE) The eGFR has been calculated using the CKD EPI equation. This calculation has not been validated in all clinical situations. eGFR's persistently <60 mL/min signify possible Chronic Kidney Disease.    Anion gap 10 5 - 15  Magnesium     Status: None   Collection Time: 04/28/15  3:25 AM  Result Value Ref Range   Magnesium 2.1 1.7 - 2.4 mg/dL  CBC     Status: Abnormal   Collection Time: 04/28/15  3:25 AM  Result Value Ref Range   WBC 15.8 (H) 4.0 - 10.5 K/uL   RBC 3.60 (L) 3.87 - 5.11 MIL/uL   Hemoglobin 11.7 (L) 12.0 - 15.0 g/dL   HCT 35.0 (L) 36.0 - 46.0 %   MCV 97.2 78.0 - 100.0 fL   MCH 32.5 26.0 - 34.0 pg   MCHC 33.4 30.0 - 36.0 g/dL   RDW 13.4 11.5 - 15.5 %   Platelets 273 150 - 400 K/uL  Glucose, capillary     Status: Abnormal   Collection Time: 04/28/15  5:53 AM  Result Value Ref Range   Glucose-Capillary 132 (H) 65 - 99 mg/dL  Basic metabolic panel     Status: None   Collection Time: 04/29/15  3:35 AM  Result Value Ref Range   Sodium 135 135 - 145 mmol/L   Potassium 4.3 3.5 - 5.1 mmol/L    Comment: DELTA CHECK NOTED   Chloride 106 101 - 111 mmol/L   CO2 22 22 - 32 mmol/L   Glucose, Bld 97 65 - 99 mg/dL   BUN 18 6 - 20 mg/dL  Creatinine, Ser 0.60 0.44 - 1.00 mg/dL   Calcium 9.2 8.9 - 10.3 mg/dL   GFR calc non Af Amer >60 >60 mL/min   GFR calc Af Amer >60 >60 mL/min    Comment: (NOTE) The eGFR has been calculated using the CKD EPI equation. This calculation has not been validated in all clinical situations. eGFR's persistently <60 mL/min signify possible Chronic Kidney Disease.    Anion gap 7 5 - 15  CBC     Status: Abnormal   Collection Time:  04/29/15  3:35 AM  Result Value Ref Range   WBC 12.4 (H) 4.0 - 10.5 K/uL   RBC 3.29 (L) 3.87 - 5.11 MIL/uL   Hemoglobin 10.8 (L) 12.0 - 15.0 g/dL   HCT 32.9 (L) 36.0 - 46.0 %   MCV 100.0 78.0 - 100.0 fL   MCH 32.8 26.0 - 34.0 pg   MCHC 32.8 30.0 - 36.0 g/dL   RDW 13.5 11.5 - 15.5 %   Platelets 239 150 - 400 K/uL  Basic metabolic panel     Status: Abnormal   Collection Time: 04/30/15  4:22 AM  Result Value Ref Range   Sodium 135 135 - 145 mmol/L   Potassium 4.0 3.5 - 5.1 mmol/L   Chloride 100 (L) 101 - 111 mmol/L   CO2 26 22 - 32 mmol/L   Glucose, Bld 111 (H) 65 - 99 mg/dL   BUN 9 6 - 20 mg/dL   Creatinine, Ser 0.65 0.44 - 1.00 mg/dL   Calcium 9.1 8.9 - 10.3 mg/dL   GFR calc non Af Amer >60 >60 mL/min   GFR calc Af Amer >60 >60 mL/min    Comment: (NOTE) The eGFR has been calculated using the CKD EPI equation. This calculation has not been validated in all clinical situations. eGFR's persistently <60 mL/min signify possible Chronic Kidney Disease.    Anion gap 9 5 - 15  CBC     Status: Abnormal   Collection Time: 05/01/15  3:40 AM  Result Value Ref Range   WBC 16.1 (H) 4.0 - 10.5 K/uL    Comment: WHITE COUNT CONFIRMED ON SMEAR   RBC 3.83 (L) 3.87 - 5.11 MIL/uL   Hemoglobin 13.0 12.0 - 15.0 g/dL   HCT 36.8 36.0 - 46.0 %   MCV 96.1 78.0 - 100.0 fL   MCH 33.9 26.0 - 34.0 pg   MCHC 35.3 30.0 - 36.0 g/dL   RDW 13.1 11.5 - 15.5 %   Platelets  150 - 400 K/uL    PLATELET CLUMPS NOTED ON SMEAR, COUNT APPEARS ADEQUATE  Glucose, capillary     Status: Abnormal   Collection Time: 05/01/15  7:03 AM  Result Value Ref Range   Glucose-Capillary 147 (H) 65 - 99 mg/dL   Comment 1 Notify RN    Comment 2 Document in Chart   Basic metabolic panel     Status: Abnormal   Collection Time: 05/01/15  7:17 AM  Result Value Ref Range   Sodium 133 (L) 135 - 145 mmol/L   Potassium 3.1 (L) 3.5 - 5.1 mmol/L   Chloride 97 (L) 101 - 111 mmol/L   CO2 26 22 - 32 mmol/L   Glucose, Bld 137 (H) 65 -  99 mg/dL   BUN 8 6 - 20 mg/dL   Creatinine, Ser 0.68 0.44 - 1.00 mg/dL   Calcium 8.8 (L) 8.9 - 10.3 mg/dL   GFR calc non Af Amer >60 >60 mL/min   GFR calc Af Amer >  60 >60 mL/min    Comment: (NOTE) The eGFR has been calculated using the CKD EPI equation. This calculation has not been validated in all clinical situations. eGFR's persistently <60 mL/min signify possible Chronic Kidney Disease.    Anion gap 10 5 - 15  Basic metabolic panel     Status: Abnormal   Collection Time: 05/02/15  4:45 AM  Result Value Ref Range   Sodium 134 (L) 135 - 145 mmol/L   Potassium 3.7 3.5 - 5.1 mmol/L   Chloride 98 (L) 101 - 111 mmol/L   CO2 26 22 - 32 mmol/L   Glucose, Bld 109 (H) 65 - 99 mg/dL   BUN 8 6 - 20 mg/dL   Creatinine, Ser 0.75 0.44 - 1.00 mg/dL   Calcium 9.2 8.9 - 10.3 mg/dL   GFR calc non Af Amer >60 >60 mL/min   GFR calc Af Amer >60 >60 mL/min    Comment: (NOTE) The eGFR has been calculated using the CKD EPI equation. This calculation has not been validated in all clinical situations. eGFR's persistently <60 mL/min signify possible Chronic Kidney Disease.    Anion gap 10 5 - 15  Glucose, capillary     Status: Abnormal   Collection Time: 05/02/15  6:06 AM  Result Value Ref Range   Glucose-Capillary 111 (H) 65 - 99 mg/dL     Dg Chest 2 View  04/25/2015   CLINICAL DATA:  Recent tripping injury with persistence shortness of Breath  EXAM: CHEST  2 VIEW  COMPARISON:  01/08/2014  FINDINGS: Cardiac shadow is stable. The lungs are clear without evidence of pneumothorax. No focal infiltrate is seen. No acute bony abnormality is noted. No pneumothorax is noted. Chronic degenerative changes of the acromioclavicular joints are seen.  IMPRESSION: No acute abnormality noted.   Electronically Signed   By: Inez Catalina M.D.   On: 04/25/2015 16:29   Ct Abdomen Pelvis W Contrast  04/25/2015   CLINICAL DATA:  Right side abdominal pain for 3 days. Nausea, vomiting.  EXAM: CT ABDOMEN AND PELVIS WITH  CONTRAST  TECHNIQUE: Multidetector CT imaging of the abdomen and pelvis was performed using the standard protocol following bolus administration of intravenous contrast.  CONTRAST:  133m OMNIPAQUE IOHEXOL 300 MG/ML  SOLN  COMPARISON:  None.  FINDINGS: Lower chest: Lung bases are clear. No effusions. Heart is normal size.  Hepatobiliary: There is gallbladder wall thickening and pericholecystic fluid compatible with acute cholecystitis. At least 2 gallstones within the gallbladder neck, the largest 10 mm. Common bile duct is dilated, measuring 10 mm. No visible distal ductal stone. Mild intrahepatic biliary ductal dilatation. No focal hepatic abnormality.  Pancreas: Fatty replacement without focal mass.  Spleen: No focal abnormality.  Normal size.  Adrenals/Urinary Tract: Fullness of the adrenal glands bilaterally compatible with hyperplasia. No focal renal abnormality or hydronephrosis. Urinary bladder is decompressed, grossly unremarkable.  Stomach/Bowel: Sigmoid diverticulosis. No active diverticulitis. Stomach and small bowel are unremarkable.  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 No retroperitoneal or mesenteric adenopathy.  Reproductive: Prior and hysterectomy.  No adnexal masses.  Other: No free fluid or free air.  Musculoskeletal: No focal bone lesion or acute bony abnormality.  IMPRESSION: At least 2 gallstones lodged within the gallbladder neck. Marked gallbladder wall thickening and pericholecystic fluid compatible with acute cholecystitis.  Biliary ductal dilatation also noted. No visible ductal stones, but a distal CBD stone cannot be excluded.   Electronically Signed  By: Rolm Baptise M.D.   On: 04/25/2015 18:19   Ir Perc Cholecystostomy  04/26/2015   CLINICAL DATA:  74 year old with acute cholecystitis and not a good surgical candidate at this time.  EXAM: ULTRASOUND AND FLUOROSCOPIC GUIDED CHOLECYSTOSTOMY TUBE PLACEMENT   Physician: Stephan Minister. Henn, MD  FLUOROSCOPY TIME:  1 minutes and 30 seconds.  46.31 mGy  MEDICATIONS: 1.5 mg Versed, 75 mcg fentanyl. A radiology nurse monitored the patient for moderate sedation.  ANESTHESIA/SEDATION: Moderate sedation time: 15 minutes  PROCEDURE: The procedure was explained to the patient. The risks and benefits of the procedure were discussed and the patient's questions were addressed. Informed consent was obtained from the patient. Patient was placed supine on the interventional table. The right upper abdomen was evaluated with ultrasound. The right upper abdomen was prepped and draped in sterile fashion. Maximal barrier sterile technique was utilized including caps, mask, sterile gowns, sterile gloves, sterile drape, hand hygiene and skin antiseptic. 1% lidocaine was used for local anesthetic. A 21 gauge needle was directed into the gallbladder from a transhepatic approach. A 0.018 wire was advanced of the gallbladder with fluoroscopy. An Accustick dilator set was placed. A small amount of brown purulent fluid was aspirated. Tract was dilated over a J-wire. A 10.2 [place] multipurpose drain was advanced over the wire and reconstituted in the gallbladder. Approximately 90 mL of purulent biliary fluid was aspirated. Catheter was sutured to the skin and attached to gravity bag. Fluid was sent for culture. Fluoroscopic and ultrasound images were taken and saved for documentation.  FINDINGS: Gallbladder was moderately distended with a small amount of wall thickening and/or pericholecystic fluid. Tube was successful placed in the gallbladder and the gallbladder was decompressed at the end of the procedure.  Estimated blood loss: Minimal  COMPLICATIONS: None  IMPRESSION: Successful percutaneous cholecystostomy tube placement.   Electronically Signed   By: Markus Daft M.D.   On: 04/26/2015 17:38     Assessment/Plan   ICD-9-CM ICD-10-CM   1. Cholecystitis, acute s/p cholecystostomy tube 575.0 K81.0     2. Cholecystostomy care [facility]) due to #1 V55.4 Z43.4   3. Atrial flutter, unspecified type ([place]) - rate controlled 427.32 I48.92   4. Chronic diastolic heart failure (HCC) - stable 428.32 I50.32   5. Pulmonary emphysema, unspecified emphysema type ([place]) - stable 492.8 J43.9   6. Anxiety and depression - uncontrolled 300.4 F41.8   7. Insomnia - uncontrolled 780.52 G47.00   8. Fibromyalgia - stable 729.1 M79.7   9. Obstructive sleep apnea on CPAP qHS 327.23 G47.33     --add alprazolam 0.61m qHS for sleep and anxiety  --daily weight and record  --cont other meds as ordered  --CPAP qhs as ordered  --PT eval as ordered. OT/ST as indicated  --f/u with specialists as scheduled  --GOAL: short term rehab and d/c home when medically appropriate. Communicated with pt and nursing.  --will follow  Jennifer [name] S. CPerlie Gold PAsante Three [facility] Centerand [facility] [address] [phone] (Monday-Friday 8 AM - 5 PM) ([phone] 5 PM and follow prompts

## 2015-05-08 ENCOUNTER — Telehealth: Payer: Self-pay | Admitting: Internal Medicine

## 2015-05-08 ENCOUNTER — Other Ambulatory Visit: Payer: Self-pay | Admitting: *Deleted

## 2015-05-08 MED ORDER — HYDROCODONE-ACETAMINOPHEN 7.5-325 MG PO TABS: ORAL_TABLET | ORAL | Status: DC

## 2015-05-08 NOTE — Telephone Encounter (Signed)
[facility] 

## 2015-05-08 NOTE — Telephone Encounter (Addendum)
Call to patient to confirm appointment for 05/12/15 at 10:15 lmtcb

## 2015-05-09 ENCOUNTER — Other Ambulatory Visit: Payer: Self-pay | Admitting: *Deleted

## 2015-05-09 NOTE — Telephone Encounter (Signed)
Pt's daughter called to ask that pt's hydrocodone be filled, i see that

## 2015-05-12 ENCOUNTER — Ambulatory Visit: Payer: Medicare HMO | Admitting: Internal Medicine

## 2015-05-12 NOTE — Telephone Encounter (Signed)
P/t's daughter informed

## 2015-05-12 NOTE — Telephone Encounter (Signed)
RF not indicated. Last filled 5/31 and was inpt 5/20 - 5/27 so didn;t use her supply for that week and pt admitted to [facility] center after D/C from hospital.

## 2015-05-12 NOTE — Telephone Encounter (Signed)
Pt has appt today, wants hydrocodone filled

## 2015-05-13 ENCOUNTER — Other Ambulatory Visit: Payer: Self-pay | Admitting: Internal Medicine

## 2015-05-13 NOTE — Telephone Encounter (Signed)
Patient currently in SNF.  If/when she is discharged home, will refill for her if needed.

## 2015-05-25 ENCOUNTER — Emergency Department (HOSPITAL_COMMUNITY): Payer: Medicare HMO

## 2015-05-25 ENCOUNTER — Encounter (HOSPITAL_COMMUNITY): Payer: Self-pay | Admitting: Physical Medicine and Rehabilitation

## 2015-05-25 ENCOUNTER — Emergency Department (HOSPITAL_COMMUNITY)
Admission: EM | Admit: 2015-05-25 | Discharge: 2015-05-25 | Disposition: A | Discharge status: Home / Self Care | Payer: Medicare HMO | Source: Home / Self Care | Attending: Emergency Medicine | Admitting: Emergency Medicine

## 2015-05-25 DIAGNOSIS — K219 Gastro-esophageal reflux disease without esophagitis: Secondary | ICD-10-CM | POA: Insufficient documentation

## 2015-05-25 DIAGNOSIS — F329 Major depressive disorder, single episode, unspecified: Secondary | ICD-10-CM | POA: Diagnosis present

## 2015-05-25 DIAGNOSIS — G43909 Migraine, unspecified, not intractable, without status migrainosus: Secondary | ICD-10-CM | POA: Diagnosis present

## 2015-05-25 DIAGNOSIS — Z6841 Body Mass Index (BMI) 40.0 and over, adult: Secondary | ICD-10-CM

## 2015-05-25 DIAGNOSIS — J449 Chronic obstructive pulmonary disease, unspecified: Secondary | ICD-10-CM

## 2015-05-25 DIAGNOSIS — Z79899 Other long term (current) drug therapy: Secondary | ICD-10-CM

## 2015-05-25 DIAGNOSIS — I519 Heart disease, unspecified: Secondary | ICD-10-CM

## 2015-05-25 DIAGNOSIS — Z9981 Dependence on supplemental oxygen: Secondary | ICD-10-CM

## 2015-05-25 DIAGNOSIS — K9189 Other postprocedural complications and disorders of digestive system: Secondary | ICD-10-CM | POA: Diagnosis not present

## 2015-05-25 DIAGNOSIS — D649 Anemia, unspecified: Secondary | ICD-10-CM | POA: Diagnosis present

## 2015-05-25 DIAGNOSIS — G8929 Other chronic pain: Secondary | ICD-10-CM

## 2015-05-25 DIAGNOSIS — I81 Portal vein thrombosis: Secondary | ICD-10-CM | POA: Diagnosis present

## 2015-05-25 DIAGNOSIS — M159 Polyosteoarthritis, unspecified: Secondary | ICD-10-CM | POA: Insufficient documentation

## 2015-05-25 DIAGNOSIS — Z713 Dietary counseling and surveillance: Secondary | ICD-10-CM | POA: Diagnosis not present

## 2015-05-25 DIAGNOSIS — I10 Essential (primary) hypertension: Secondary | ICD-10-CM | POA: Insufficient documentation

## 2015-05-25 DIAGNOSIS — E876 Hypokalemia: Secondary | ICD-10-CM

## 2015-05-25 DIAGNOSIS — Z7951 Long term (current) use of inhaled steroids: Secondary | ICD-10-CM | POA: Insufficient documentation

## 2015-05-25 DIAGNOSIS — I480 Paroxysmal atrial fibrillation: Secondary | ICD-10-CM | POA: Diagnosis present

## 2015-05-25 DIAGNOSIS — K838 Other specified diseases of biliary tract: Secondary | ICD-10-CM | POA: Diagnosis present

## 2015-05-25 DIAGNOSIS — Z8742 Personal history of other diseases of the female genital tract: Secondary | ICD-10-CM | POA: Insufficient documentation

## 2015-05-25 DIAGNOSIS — R7309 Other abnormal glucose: Secondary | ICD-10-CM | POA: Diagnosis present

## 2015-05-25 DIAGNOSIS — Z862 Personal history of diseases of the blood and blood-forming organs and certain disorders involving the immune mechanism: Secondary | ICD-10-CM

## 2015-05-25 DIAGNOSIS — Z8619 Personal history of other infectious and parasitic diseases: Secondary | ICD-10-CM | POA: Insufficient documentation

## 2015-05-25 DIAGNOSIS — M797 Fibromyalgia: Secondary | ICD-10-CM | POA: Diagnosis present

## 2015-05-25 DIAGNOSIS — Z90710 Acquired absence of both cervix and uterus: Secondary | ICD-10-CM

## 2015-05-25 DIAGNOSIS — R109 Unspecified abdominal pain: Secondary | ICD-10-CM

## 2015-05-25 DIAGNOSIS — R1011 Right upper quadrant pain: Secondary | ICD-10-CM | POA: Diagnosis not present

## 2015-05-25 DIAGNOSIS — Z87891 Personal history of nicotine dependence: Secondary | ICD-10-CM

## 2015-05-25 DIAGNOSIS — Z8701 Personal history of pneumonia (recurrent): Secondary | ICD-10-CM

## 2015-05-25 DIAGNOSIS — K5900 Constipation, unspecified: Secondary | ICD-10-CM | POA: Diagnosis present

## 2015-05-25 DIAGNOSIS — F419 Anxiety disorder, unspecified: Secondary | ICD-10-CM | POA: Diagnosis present

## 2015-05-25 DIAGNOSIS — G4733 Obstructive sleep apnea (adult) (pediatric): Secondary | ICD-10-CM | POA: Diagnosis present

## 2015-05-25 DIAGNOSIS — Z8669 Personal history of other diseases of the nervous system and sense organs: Secondary | ICD-10-CM

## 2015-05-25 DIAGNOSIS — R61 Generalized hyperhidrosis: Secondary | ICD-10-CM | POA: Diagnosis present

## 2015-05-25 DIAGNOSIS — K819 Cholecystitis, unspecified: Secondary | ICD-10-CM

## 2015-05-25 DIAGNOSIS — I471 Supraventricular tachycardia: Secondary | ICD-10-CM | POA: Diagnosis present

## 2015-05-25 DIAGNOSIS — Z7982 Long term (current) use of aspirin: Secondary | ICD-10-CM

## 2015-05-25 DIAGNOSIS — I5032 Chronic diastolic (congestive) heart failure: Secondary | ICD-10-CM | POA: Diagnosis present

## 2015-05-25 DIAGNOSIS — I714 Abdominal aortic aneurysm, without rupture: Secondary | ICD-10-CM | POA: Diagnosis present

## 2015-05-25 DIAGNOSIS — R000 Tachycardia, unspecified: Secondary | ICD-10-CM | POA: Diagnosis present

## 2015-05-25 DIAGNOSIS — Y848 Other medical procedures as the cause of abnormal reaction of the patient, or of later complication, without mention of misadventure at the time of the procedure: Secondary | ICD-10-CM | POA: Diagnosis present

## 2015-05-25 LAB — CBC WITH DIFFERENTIAL/PLATELET
BASOS PCT: 0 % (ref 0–1)
Basophils Absolute: 0 10*3/uL (ref 0.0–0.1)
EOS ABS: 0 10*3/uL (ref 0.0–0.7)
Eosinophils Relative: 0 % (ref 0–5)
HCT: 36.7 % (ref 36.0–46.0)
Hemoglobin: 12.2 g/dL (ref 12.0–15.0)
Lymphocytes Relative: 7 % — ABNORMAL LOW (ref 12–46)
Lymphs Abs: 0.6 10*3/uL — ABNORMAL LOW (ref 0.7–4.0)
MCH: 32.7 pg (ref 26.0–34.0)
MCHC: 33.2 g/dL (ref 30.0–36.0)
MCV: 98.4 fL (ref 78.0–100.0)
MONO ABS: 0.5 10*3/uL (ref 0.1–1.0)
Monocytes Relative: 6 % (ref 3–12)
Neutro Abs: 7.2 10*3/uL (ref 1.7–7.7)
Neutrophils Relative %: 87 % — ABNORMAL HIGH (ref 43–77)
Platelets: 210 10*3/uL (ref 150–400)
RBC: 3.73 MIL/uL — AB (ref 3.87–5.11)
RDW: 13.3 % (ref 11.5–15.5)
WBC: 8.3 10*3/uL (ref 4.0–10.5)

## 2015-05-25 LAB — COMPREHENSIVE METABOLIC PANEL
ALK PHOS: 240 U/L — AB (ref 38–126)
ALT: 493 U/L — AB (ref 14–54)
AST: 1065 U/L — AB (ref 15–41)
Albumin: 3 g/dL — ABNORMAL LOW (ref 3.5–5.0)
Anion gap: 7 (ref 5–15)
BILIRUBIN TOTAL: 1.6 mg/dL — AB (ref 0.3–1.2)
BUN: 10 mg/dL (ref 6–20)
CO2: 22 mmol/L (ref 22–32)
CREATININE: 0.58 mg/dL (ref 0.44–1.00)
Calcium: 9.3 mg/dL (ref 8.9–10.3)
Chloride: 107 mmol/L (ref 101–111)
GFR calc Af Amer: >60 mL/min (ref >60)
GFR calc non Af Amer: >60 mL/min (ref >60)
Glucose, Bld: 202 mg/dL — ABNORMAL HIGH (ref 65–99)
POTASSIUM: 3.4 mmol/L — AB (ref 3.5–5.1)
SODIUM: 136 mmol/L (ref 135–145)
TOTAL PROTEIN: 7 g/dL (ref 6.5–8.1)

## 2015-05-25 LAB — LIPASE, BLOOD: LIPASE: 14 U/L — AB (ref 22–51)

## 2015-05-25 LAB — TROPONIN I: Troponin I: <0.03 ng/mL (ref <0.031)

## 2015-05-25 MED ORDER — HYDROMORPHONE HCL 1 MG/ML IJ SOLN
0.5000 mg | INTRAMUSCULAR | Status: DC | PRN
  Administered 2015-05-25: 0.5 mg via INTRAVENOUS
  Filled 2015-05-25: qty 1

## 2015-05-25 MED ORDER — MORPHINE SULFATE 4 MG/ML IJ SOLN
4.0000 mg | INTRAMUSCULAR | Status: DC | PRN
  Administered 2015-05-25: 4 mg via INTRAVENOUS
  Filled 2015-05-25: qty 1

## 2015-05-25 MED ORDER — ONDANSETRON HCL 4 MG/2ML IJ SOLN
4.0000 mg | Freq: Once | INTRAMUSCULAR | Status: AC
  Administered 2015-05-25: 4 mg via INTRAVENOUS
  Filled 2015-05-25: qty 2

## 2015-05-25 MED ORDER — HYDROMORPHONE HCL 1 MG/ML IJ SOLN
0.5000 mg | Freq: Once | INTRAMUSCULAR | Status: AC
  Administered 2015-05-25: 0.5 mg via INTRAVENOUS
  Filled 2015-05-25: qty 1

## 2015-05-25 MED ORDER — ONDANSETRON 4 MG PO TBDP: 4.0000 mg | ORAL_TABLET | Freq: Three times a day (TID) | ORAL | Status: DC | PRN

## 2015-05-25 MED ORDER — IOHEXOL 300 MG/ML  SOLN
50.0000 mL | Freq: Once | INTRAMUSCULAR | Status: AC | PRN
  Administered 2015-05-25: 10 mL

## 2015-05-25 MED ORDER — HYDROCODONE-ACETAMINOPHEN 5-325 MG PO TABS: 2.0000 | ORAL_TABLET | ORAL | Status: DC | PRN

## 2015-05-25 NOTE — ED Provider Notes (Signed)
CSN: [id number]     Arrival date & time 05/25/15  0846 History   First MD Initiated Contact with Patient 05/25/15 [phone]     Chief Complaint  Patient presents with  . Abdominal Pain  . Emesis     HPI  She presents for evaluation of nausea and abdominal pain and blood in her biliary drainage bag. Patient presented several weeks ago with an episode of acute cholecystitis. Was thought to have many comorbidities that would preclude emergent cholecystectomy. Underwent intervention radiology placement of a cholecystostomy drainage tube. Has been convalescing at a nearby care facility. Family members noted that there was a trace amount of blood in her biliary tract days ago. He states that this morning it seemed to be "filled with blood" they became concerned. Patient has intermittent episodes of pain and nausea since her discharge. It is planned, but not currently scheduled to have her return for outpatient elective cholecystectomy.  Past Medical History  Diagnosis Date  . COPD (chronic obstructive pulmonary disease)     O2 dependent. Followed by Dr. Joya Gaskins  . Fibromyalgia   . Hypertension   . OSA (obstructive sleep apnea)     Sleep study done April 02, 2008 showed severe obstructive sleep apnea.  . Diastolic dysfunction     Grade I diastolic dysfunction as shown on ECHO Dec 2011.  EF of 65-70%.  Marland Kitchen COPD (chronic obstructive pulmonary disease)     Followed by Dr. Joya Gaskins  . Dyslipidemia   . Osteopenia     DXA scan done on 10/23/2009 showed osteopenia of AP spine (young adult T-score -1.3), osteopenia of left femur neck (young adult T-score -1.8), and normal bone density of right femur neck (young adult T-score -0.8), with a FRAX estimate of the 10-year probability of a major osteoporotic fracture of 9.7% and probability of hip fracture 1.6%.  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 Estanislado Pandy on 11/10/2004.  MRA of the head  on 03/09/2012 showed no evidence for recanalization.  . Osteoarthrosis involving multiple sites   . Chronic back pain   . Depression   . Anxiety   . Anemia   . GERD (gastroesophageal reflux disease)   . Meniere disease     Followed by ENT Dr. Silvestre Moment  . Diverticulosis of colon   . Multiple pigmented nevi   . Hypokalemia   . S/P hysterectomy 1975  . Pneumonia 11/2004  . Knee pain   . Dysphagia   . Urosepsis 11/04/2010    Klebsiella pneumoniae  . Breast pain   . Allergic rhinitis   . Fibromyalgia   . 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     . Urinary incontinence    Past Surgical History  Procedure Laterality Date  . Abdominal hysterectomy  1975  . 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 Estanislado Pandy on 11/10/2004.   Family History  Problem Relation Age of Onset  . Ulcers Mother   . Ovarian cancer Sister 57  . Cervical cancer Daughter   . Cervical cancer Daughter   . Lung cancer Neg Hx   . Colon cancer Neg Hx   . Heart attack Father   .  Cerebral aneurysm Sister 43   History  Substance Use Topics  . Smoking status: Former Smoker -- 0.50 packs/day for 30 years    Types: Cigarettes    Quit date: 12/07/2008  . Smokeless tobacco: Former Systems developer    Types: [place] date: 12/06/1978  . Alcohol Use: No   OB History    No data available     Review of Systems  Constitutional: Negative for fever, chills, diaphoresis, appetite change and fatigue.  HENT: Negative for mouth sores, sore throat and trouble swallowing.   Eyes: Negative for visual disturbance.  Respiratory: Negative for cough, chest tightness, shortness of breath and wheezing.   Cardiovascular:  Negative for chest pain.  Gastrointestinal: Negative for nausea, vomiting, abdominal pain, diarrhea and abdominal distention.       Abdomen soft. Biliary drain in the right lateral subcostal abdomen. Filled with serosanguineous fluid. He has minimal tenderness across the abdomen diffusely without localizing peritonitis or rigidity.  Endocrine: Negative for polydipsia, polyphagia and polyuria.  Genitourinary: Negative for dysuria, frequency and hematuria.  Musculoskeletal: Negative for gait problem.  Skin: Negative for color change, pallor and rash.  Neurological: Negative for dizziness, syncope, light-headedness and headaches.  Hematological: Does not bruise/bleed easily.  Psychiatric/Behavioral: Negative for behavioral problems and confusion.      Allergies  Flonase; Lisinopril; and Tetracycline  Home Medications   Prior to Admission medications   Medication Sig Start Date End Date Taking? Authorizing Provider  ALPRAZolam Duanne Moron) 0.5 MG tablet Take 1 tablet (0.5 mg total) by mouth 2 (two) times daily as needed for anxiety. 05/02/15  Yes Rushil Sherrye Payor, MD  amitriptyline (ELAVIL) 50 MG tablet Take 1 tablet (50 mg total) by mouth at bedtime. 01/03/15  Yes Bertha Stakes, MD  amoxicillin-clavulanate (AUGMENTIN) 875-125 MG per tablet Take 1 tablet by mouth every 12 (twelve) hours. 05/02/15  Yes Rushil Sherrye Payor, MD  aspirin 81 MG EC tablet Take 81 mg by mouth daily.     Yes Historical Provider, MD  carisoprodol (SOMA) 350 MG tablet Take 1 tablet (350 mg total) by mouth daily as needed for muscle spasms. 01/03/15  Yes Bertha Stakes, MD  Cholecalciferol (VITAMIN D3) 1000 UNITS CAPS Take 1 capsule (1,000 Units total) by mouth daily. 01/23/15  Yes Bertha Stakes, MD  CINNAMON PO Take by mouth 2 (two) times daily.   Yes Historical Provider, MD  dextromethorphan-guaiFENesin (MUCINEX DM) 30-600 MG per 12 hr tablet Take 1 tablet by mouth 2 (two) times daily.   Yes Historical Provider, MD  docusate sodium  (COLACE) 100 MG capsule Take 100 mg by mouth daily as needed for mild constipation or moderate constipation.   Yes Historical Provider, MD  Fluticasone-Salmeterol (ADVAIR DISKUS) 250-50 MCG/DOSE AEPB Inhale 1 puff into the lungs 2 (two) times daily. 10/14/14  Yes Elsie Stain, MD  hydrochlorothiazide (HYDRODIURIL) 25 MG tablet Take 0.5 tablets (12.5 mg total) by mouth daily. 03/07/14  Yes Bertha Stakes, MD  hydroxypropyl methylcellulose (ISOPTO TEARS) 2.5 % ophthalmic solution Place 2 drops into both eyes 3 (three) times daily as needed. Dry eye   Yes Historical Provider, MD  ibuprofen (ADVIL,MOTRIN) 200 MG tablet Take 200 mg by mouth 4 (four) times daily as needed for moderate pain. For pain    Yes Historical Provider, MD  meclizine (ANTIVERT) 12.5 MG tablet Take 1 tablet (12.5 mg total) by mouth 2 (two) times daily as needed for dizziness. 11/20/14  Yes Bertha Stakes, MD  Multiple Vitamin (MULTIVITAMIN) tablet Take  1 tablet by mouth daily.   Yes Historical Provider, MD  NIFEdipine (ADALAT CC) 90 MG 24 hr tablet Take 1 tablet (90 mg total) by mouth daily. 09/06/14  Yes Bertha Stakes, MD  omeprazole (PRILOSEC) 20 MG capsule Take 1 capsule (20 mg total) by mouth 2 (two) times daily. 05/06/15  Yes Oval Linsey, MD  potassium chloride SA (K-DUR,KLOR-CON) 20 MEQ tablet Take 20 mEq by mouth 2 (two) times daily.   Yes Historical Provider, MD  ranitidine (ZANTAC) 150 MG tablet Take 1 tablet (150 mg total) by mouth at bedtime. 03/05/15  Yes Bertha Stakes, MD  Red Yeast Rice Extract (RED YEAST RICE PO) Take by mouth 2 (two) times daily.   Yes Historical Provider, MD  sertraline (ZOLOFT) 100 MG tablet Take 1 tablet (100 mg total) by mouth 2 (two) times daily. 09/06/14  Yes Bertha Stakes, MD  tiotropium (SPIRIVA HANDIHALER) 18 MCG inhalation capsule Place 1 capsule (18 mcg total) into inhaler and inhale daily. 10/14/14  Yes Elsie Stain, MD  topiramate (TOPAMAX) 50 MG tablet Take 1 tablet (50 mg total) by mouth 2  (two) times daily. 04/02/15  Yes Bertha Stakes, MD  albuterol (PROAIR HFA) 108 (90 BASE) MCG/ACT inhaler INHALE 2 PUFFS BY MOUTH INTO LUNGS FOUR TIMES DAILY AS NEEDED Patient taking differently: Inhale 1-2 puffs into the lungs every 6 (six) hours as needed for wheezing or shortness of breath.  10/14/14   Elsie Stain, MD  HYDROcodone-acetaminophen (NORCO/VICODIN) 5-325 MG per tablet Take 2 tablets by mouth every 4 (four) hours as needed. 05/25/15   Tanna Furry, MD  ondansetron (ZOFRAN ODT) 4 MG disintegrating tablet Take 1 tablet (4 mg total) by mouth every 8 (eight) hours as needed for nausea. 05/25/15   Tanna Furry, MD  triamcinolone cream (KENALOG) 0.1 % APPLY TOPICALLY TO THE AFFECTED AREA OF HAND TWICE DAILY AS NEEDED Patient not taking: Reported on 05/25/2015 03/07/14   Bertha Stakes, MD   BP 149/107 mmHg  Pulse 93  Temp(Src) 97.9 F (36.6 C) (Oral)  Resp 18  SpO2 99% Physical Exam  Constitutional: She is oriented to person, place, and time. She appears well-developed and well-nourished. No distress.  HENT:  Head: Normocephalic.  Eyes: Conjunctivae are normal. Pupils are equal, round, and reactive to light. No scleral icterus.  Neck: Normal range of motion. Neck supple. No thyromegaly present.  Cardiovascular: Normal rate and regular rhythm.  Exam reveals no gallop and no friction rub.   No murmur heard. Pulmonary/Chest: Effort normal and breath sounds normal. No respiratory distress. She has no wheezes. She has no rales.  Abdominal: Soft. Bowel sounds are normal. She exhibits no distension. There is no tenderness. There is no rebound.    Musculoskeletal: Normal range of motion.  Neurological: She is alert and oriented to person, place, and time.  Skin: Skin is warm and dry. No rash noted.  Psychiatric: She has a normal mood and affect. Her behavior is normal.    ED Course  Procedures (including critical care time) Labs Review Labs Reviewed  CBC WITH DIFFERENTIAL/PLATELET -  Abnormal; Notable for the following:    RBC 3.73 (*)    Neutrophils Relative % 87 (*)    Lymphocytes Relative 7 (*)    Lymphs Abs 0.6 (*)    All other components within normal limits  COMPREHENSIVE METABOLIC PANEL - Abnormal; Notable for the following:    Potassium 3.4 (*)    Glucose, Bld 202 (*)    Albumin 3.0 (*)  AST 1065 (*)    ALT 493 (*)    Alkaline Phosphatase 240 (*)    Total Bilirubin 1.6 (*)    All other components within normal limits  LIPASE, BLOOD - Abnormal; Notable for the following:    Lipase 14 (*)    All other components within normal limits  TROPONIN I    Imaging Review Dg Chest Port 1 View  05/25/2015   CLINICAL DATA:  Acute chest pain.  EXAM: PORTABLE CHEST - 1 VIEW  COMPARISON:  Apr 25, 2015.  FINDINGS: Stable cardiomediastinal silhouette. No pneumothorax or pleural effusion is noted. Both lungs are clear. The visualized skeletal structures are unremarkable.  IMPRESSION: No acute cardiopulmonary abnormality seen.   Electronically Signed   By: Marijo Conception, M.D.   On: 05/25/2015 10:20     EKG Interpretation None      MDM   Final diagnoses:  Abdominal pain  AP (abdominal pain)  Cholecystitis    Chin discussed with Dr. Rosendo Gros of general surgery. Discussed with radiology Dr. Lenox Ahr.  Patient underwent contrast study of her biliary drain. This shows a patent cystic duct and rapid filling of her biliary system per Dr.Hass radiology.  Patient does have elevation of alkaline phosphatase and transaminases. No leukocytosis. No fever. She did have some colicky discomfort that recurred after her injection. This has improved.  Dr. Rosendo Gros discussed with me after the patient's studies that he felt she could go home. Radiology would like to have her return in 24 hours. They would like to have her biliary drain clamped. And have her reevaluated in radiology to reinstitute her drainage bag.    Tanna Furry, MD 05/25/15 1426

## 2015-05-25 NOTE — ED Notes (Signed)
Pt called out. Bed saturated with urine. Full linen changed, sheet, blankets. Pt cleaned and new pads and brief placed. Pt given clean warm blanket.

## 2015-05-25 NOTE — ED Notes (Signed)
Pt waiting on [facility] for transport back to [facility]. No signs of distress noted, family at bedside. NAD

## 2015-05-25 NOTE — Procedures (Signed)
Indication - bleeding from CHole drain. Find - Mostly serous fluid mixed with old blood in drain bag. Cystic duct is patent and drain is positioned inside GB. Comp -  None EBL - None Plan - cap drain for 24 hrs, then resume drainage.

## 2015-05-25 NOTE — ED Notes (Addendum)
Pt presents to department for evaluation of diffuse abdominal pain, nausea/vomiting and bloody drainage from gallbladder catheter. 8/10 pain upon arrival to ED.

## 2015-05-25 NOTE — ED Notes (Signed)
[facility] at bedside to transport patient to [facility].

## 2015-05-25 NOTE — Discharge Instructions (Signed)
Clear liquids only tonight. Return to ER with any worsening of your symptoms tonight Return tomorrow to radiology to have your biliary drain reevaluated

## 2015-05-25 NOTE — ED Notes (Signed)
Pt placed in gown and in bed. Pt monitored by pulse ox, bp cuff, and 5-lead. 

## 2015-05-25 NOTE — ED Notes (Signed)
Pt on bedpan at the time.

## 2015-05-27 ENCOUNTER — Emergency Department (HOSPITAL_COMMUNITY): Payer: Medicare HMO

## 2015-05-27 ENCOUNTER — Encounter: Payer: Self-pay | Admitting: Internal Medicine

## 2015-05-27 ENCOUNTER — Ambulatory Visit (HOSPITAL_COMMUNITY)
Admission: RE | Admit: 2015-05-27 | Discharge: 2015-05-27 | Disposition: A | Discharge status: Home / Self Care | Payer: Medicare HMO | Source: Ambulatory Visit | Attending: Interventional Radiology | Admitting: Interventional Radiology

## 2015-05-27 ENCOUNTER — Other Ambulatory Visit (HOSPITAL_COMMUNITY): Payer: Self-pay | Admitting: Interventional Radiology

## 2015-05-27 ENCOUNTER — Encounter (HOSPITAL_COMMUNITY): Payer: Self-pay | Admitting: *Deleted

## 2015-05-27 ENCOUNTER — Other Ambulatory Visit: Payer: Self-pay

## 2015-05-27 ENCOUNTER — Non-Acute Institutional Stay (SKILLED_NURSING_FACILITY): Payer: Medicare HMO | Admitting: Internal Medicine

## 2015-05-27 ENCOUNTER — Encounter (HOSPITAL_COMMUNITY): Payer: Self-pay | Admitting: Cardiology

## 2015-05-27 ENCOUNTER — Inpatient Hospital Stay (HOSPITAL_COMMUNITY)
Admission: EM | Admit: 2015-05-27 | Discharge: 2015-05-30 | DRG: 393 | Disposition: A | Discharge status: Skilled Nursing Facility | Payer: Medicare HMO | Attending: Internal Medicine | Admitting: Internal Medicine

## 2015-05-27 DIAGNOSIS — R109 Unspecified abdominal pain: Secondary | ICD-10-CM | POA: Insufficient documentation

## 2015-05-27 DIAGNOSIS — Z7951 Long term (current) use of inhaled steroids: Secondary | ICD-10-CM | POA: Diagnosis not present

## 2015-05-27 DIAGNOSIS — D649 Anemia, unspecified: Secondary | ICD-10-CM | POA: Diagnosis present

## 2015-05-27 DIAGNOSIS — I471 Supraventricular tachycardia, unspecified: Secondary | ICD-10-CM | POA: Diagnosis present

## 2015-05-27 DIAGNOSIS — T85518D Breakdown (mechanical) of other gastrointestinal prosthetic devices, implants and grafts, subsequent encounter: Secondary | ICD-10-CM

## 2015-05-27 DIAGNOSIS — A419 Sepsis, unspecified organism: Secondary | ICD-10-CM | POA: Diagnosis not present

## 2015-05-27 DIAGNOSIS — M797 Fibromyalgia: Secondary | ICD-10-CM | POA: Diagnosis present

## 2015-05-27 DIAGNOSIS — K219 Gastro-esophageal reflux disease without esophagitis: Secondary | ICD-10-CM | POA: Diagnosis present

## 2015-05-27 DIAGNOSIS — I5032 Chronic diastolic (congestive) heart failure: Secondary | ICD-10-CM | POA: Diagnosis present

## 2015-05-27 DIAGNOSIS — T85598A Other mechanical complication of other gastrointestinal prosthetic devices, implants and grafts, initial encounter: Secondary | ICD-10-CM

## 2015-05-27 DIAGNOSIS — G4733 Obstructive sleep apnea (adult) (pediatric): Secondary | ICD-10-CM | POA: Diagnosis present

## 2015-05-27 DIAGNOSIS — R61 Generalized hyperhidrosis: Secondary | ICD-10-CM | POA: Diagnosis present

## 2015-05-27 DIAGNOSIS — Y848 Other medical procedures as the cause of abnormal reaction of the patient, or of later complication, without mention of misadventure at the time of the procedure: Secondary | ICD-10-CM | POA: Diagnosis present

## 2015-05-27 DIAGNOSIS — I714 Abdominal aortic aneurysm, without rupture, unspecified: Secondary | ICD-10-CM | POA: Diagnosis present

## 2015-05-27 DIAGNOSIS — I480 Paroxysmal atrial fibrillation: Secondary | ICD-10-CM

## 2015-05-27 DIAGNOSIS — I81 Portal vein thrombosis: Secondary | ICD-10-CM | POA: Diagnosis present

## 2015-05-27 DIAGNOSIS — T85518A Breakdown (mechanical) of other gastrointestinal prosthetic devices, implants and grafts, initial encounter: Secondary | ICD-10-CM

## 2015-05-27 DIAGNOSIS — J449 Chronic obstructive pulmonary disease, unspecified: Secondary | ICD-10-CM | POA: Diagnosis present

## 2015-05-27 DIAGNOSIS — F329 Major depressive disorder, single episode, unspecified: Secondary | ICD-10-CM | POA: Diagnosis present

## 2015-05-27 DIAGNOSIS — K5900 Constipation, unspecified: Secondary | ICD-10-CM | POA: Diagnosis present

## 2015-05-27 DIAGNOSIS — Z79899 Other long term (current) drug therapy: Secondary | ICD-10-CM | POA: Diagnosis not present

## 2015-05-27 DIAGNOSIS — K838 Other specified diseases of biliary tract: Secondary | ICD-10-CM

## 2015-05-27 DIAGNOSIS — I10 Essential (primary) hypertension: Secondary | ICD-10-CM | POA: Diagnosis present

## 2015-05-27 DIAGNOSIS — Z713 Dietary counseling and surveillance: Secondary | ICD-10-CM | POA: Diagnosis not present

## 2015-05-27 DIAGNOSIS — I4891 Unspecified atrial fibrillation: Secondary | ICD-10-CM | POA: Diagnosis not present

## 2015-05-27 DIAGNOSIS — F419 Anxiety disorder, unspecified: Secondary | ICD-10-CM | POA: Diagnosis present

## 2015-05-27 DIAGNOSIS — R000 Tachycardia, unspecified: Secondary | ICD-10-CM | POA: Diagnosis present

## 2015-05-27 DIAGNOSIS — T148XXA Other injury of unspecified body region, initial encounter: Secondary | ICD-10-CM

## 2015-05-27 DIAGNOSIS — K810 Acute cholecystitis: Secondary | ICD-10-CM

## 2015-05-27 DIAGNOSIS — Z9981 Dependence on supplemental oxygen: Secondary | ICD-10-CM | POA: Diagnosis not present

## 2015-05-27 DIAGNOSIS — R7309 Other abnormal glucose: Secondary | ICD-10-CM | POA: Diagnosis present

## 2015-05-27 DIAGNOSIS — Z8719 Personal history of other diseases of the digestive system: Secondary | ICD-10-CM | POA: Diagnosis not present

## 2015-05-27 DIAGNOSIS — R1084 Generalized abdominal pain: Secondary | ICD-10-CM | POA: Diagnosis not present

## 2015-05-27 DIAGNOSIS — G43909 Migraine, unspecified, not intractable, without status migrainosus: Secondary | ICD-10-CM | POA: Diagnosis present

## 2015-05-27 DIAGNOSIS — Z6841 Body Mass Index (BMI) 40.0 and over, adult: Secondary | ICD-10-CM | POA: Diagnosis not present

## 2015-05-27 DIAGNOSIS — Z7982 Long term (current) use of aspirin: Secondary | ICD-10-CM | POA: Diagnosis not present

## 2015-05-27 DIAGNOSIS — E876 Hypokalemia: Secondary | ICD-10-CM | POA: Diagnosis present

## 2015-05-27 DIAGNOSIS — R1011 Right upper quadrant pain: Secondary | ICD-10-CM | POA: Diagnosis present

## 2015-05-27 DIAGNOSIS — I820 Budd-Chiari syndrome: Secondary | ICD-10-CM

## 2015-05-27 DIAGNOSIS — K9189 Other postprocedural complications and disorders of digestive system: Secondary | ICD-10-CM | POA: Diagnosis present

## 2015-05-27 LAB — COMPREHENSIVE METABOLIC PANEL
ALBUMIN: 2.5 g/dL — AB (ref 3.5–5.0)
ALK PHOS: 249 U/L — AB (ref 38–126)
ALT: 523 U/L — ABNORMAL HIGH (ref 14–54)
ANION GAP: 12 (ref 5–15)
AST: 143 U/L — ABNORMAL HIGH (ref 15–41)
BUN: 13 mg/dL (ref 6–20)
CO2: 23 mmol/L (ref 22–32)
Calcium: 8.9 mg/dL (ref 8.9–10.3)
Chloride: 97 mmol/L — ABNORMAL LOW (ref 101–111)
Creatinine, Ser: 0.68 mg/dL (ref 0.44–1.00)
GFR calc non Af Amer: >60 mL/min (ref >60)
GLUCOSE: 119 mg/dL — AB (ref 65–99)
POTASSIUM: 2.8 mmol/L — AB (ref 3.5–5.1)
Sodium: 132 mmol/L — ABNORMAL LOW (ref 135–145)
Total Bilirubin: 3.1 mg/dL — ABNORMAL HIGH (ref 0.3–1.2)
Total Protein: 6.4 g/dL — ABNORMAL LOW (ref 6.5–8.1)

## 2015-05-27 LAB — CBC WITH DIFFERENTIAL/PLATELET
BASOS ABS: 0 10*3/uL (ref 0.0–0.1)
Basophils Relative: 0 % (ref 0–1)
EOS ABS: 0 10*3/uL (ref 0.0–0.7)
EOS PCT: 0 % (ref 0–5)
HCT: 33.4 % — ABNORMAL LOW (ref 36.0–46.0)
Hemoglobin: 11.3 g/dL — ABNORMAL LOW (ref 12.0–15.0)
LYMPHS ABS: 1.6 10*3/uL (ref 0.7–4.0)
Lymphocytes Relative: 10 % — ABNORMAL LOW (ref 12–46)
MCH: 32.7 pg (ref 26.0–34.0)
MCHC: 33.8 g/dL (ref 30.0–36.0)
MCV: 96.5 fL (ref 78.0–100.0)
Monocytes Absolute: 1.7 10*3/uL — ABNORMAL HIGH (ref 0.1–1.0)
Monocytes Relative: 11 % (ref 3–12)
NEUTROS PCT: 79 % — AB (ref 43–77)
Neutro Abs: 12.1 10*3/uL — ABNORMAL HIGH (ref 1.7–7.7)
Platelets: 236 10*3/uL (ref 150–400)
RBC: 3.46 MIL/uL — AB (ref 3.87–5.11)
RDW: 14.1 % (ref 11.5–15.5)
WBC: 15.5 10*3/uL — ABNORMAL HIGH (ref 4.0–10.5)

## 2015-05-27 LAB — URINE MICROSCOPIC-ADD ON

## 2015-05-27 LAB — URINALYSIS, ROUTINE W REFLEX MICROSCOPIC
GLUCOSE, UA: NEGATIVE mg/dL
Hgb urine dipstick: NEGATIVE
KETONES UR: NEGATIVE mg/dL
NITRITE: NEGATIVE
PH: 5.5 (ref 5.0–8.0)
Protein, ur: NEGATIVE mg/dL
SPECIFIC GRAVITY, URINE: 1.016 (ref 1.005–1.030)
Urobilinogen, UA: 0.2 mg/dL (ref 0.0–1.0)

## 2015-05-27 LAB — MAGNESIUM: Magnesium: 1.8 mg/dL (ref 1.7–2.4)

## 2015-05-27 LAB — LIPASE, BLOOD: Lipase: 11 U/L — ABNORMAL LOW (ref 22–51)

## 2015-05-27 LAB — I-STAT CG4 LACTIC ACID, ED
LACTIC ACID, VENOUS: 0.95 mmol/L (ref 0.5–2.0)
Lactic Acid, Venous: 0.97 mmol/L (ref 0.5–2.0)

## 2015-05-27 MED ORDER — SODIUM CHLORIDE 0.9 % IV SOLN
1000.0000 mL | Freq: Once | INTRAVENOUS | Status: AC
  Administered 2015-05-27: 1000 mL via INTRAVENOUS

## 2015-05-27 MED ORDER — TIOTROPIUM BROMIDE MONOHYDRATE 18 MCG IN CAPS
18.0000 ug | ORAL_CAPSULE | Freq: Every day | RESPIRATORY_TRACT | Status: DC
  Administered 2015-05-28 – 2015-05-30 (×3): 18 ug via RESPIRATORY_TRACT
  Filled 2015-05-27: qty 5

## 2015-05-27 MED ORDER — ALBUTEROL SULFATE (2.5 MG/3ML) 0.083% IN NEBU: 3.0000 mL | INHALATION_SOLUTION | Freq: Four times a day (QID) | RESPIRATORY_TRACT | Status: DC | PRN

## 2015-05-27 MED ORDER — IOHEXOL 300 MG/ML  SOLN: 100.0000 mL | Freq: Once | INTRAMUSCULAR | Status: DC | PRN

## 2015-05-27 MED ORDER — IOHEXOL 300 MG/ML  SOLN
50.0000 mL | Freq: Once | INTRAMUSCULAR | Status: AC | PRN
  Administered 2015-05-27: 10 mL via INTRAVENOUS

## 2015-05-27 MED ORDER — POTASSIUM CHLORIDE 10 MEQ/100ML IV SOLN
10.0000 meq | INTRAVENOUS | Status: AC
  Administered 2015-05-27 – 2015-05-28 (×6): 10 meq via INTRAVENOUS
  Filled 2015-05-27 (×2): qty 100

## 2015-05-27 MED ORDER — SERTRALINE HCL 100 MG PO TABS
100.0000 mg | ORAL_TABLET | Freq: Two times a day (BID) | ORAL | Status: DC
Start: 2015-05-27 — End: 2015-05-30
  Administered 2015-05-28 – 2015-05-30 (×6): 100 mg via ORAL
  Filled 2015-05-27 (×7): qty 1

## 2015-05-27 MED ORDER — HYDROCODONE-ACETAMINOPHEN 5-325 MG PO TABS
2.0000 | ORAL_TABLET | Freq: Four times a day (QID) | ORAL | Status: DC | PRN
  Administered 2015-05-28 – 2015-05-30 (×8): 2 via ORAL
  Filled 2015-05-27 (×8): qty 2

## 2015-05-27 MED ORDER — SODIUM CHLORIDE 0.9 % IV BOLUS (SEPSIS)
1000.0000 mL | INTRAVENOUS | Status: DC
  Administered 2015-05-27: 1000 mL via INTRAVENOUS

## 2015-05-27 MED ORDER — ALPRAZOLAM 0.5 MG PO TABS
0.5000 mg | ORAL_TABLET | Freq: Two times a day (BID) | ORAL | Status: DC | PRN
  Administered 2015-05-28 – 2015-05-29 (×4): 0.5 mg via ORAL
  Filled 2015-05-27 (×4): qty 1

## 2015-05-27 MED ORDER — SODIUM CHLORIDE 0.9 % IV SOLN
1250.0000 mg | Freq: Two times a day (BID) | INTRAVENOUS | Status: DC
  Administered 2015-05-27 – 2015-05-29 (×4): 1250 mg via INTRAVENOUS
  Filled 2015-05-27 (×6): qty 1250

## 2015-05-27 MED ORDER — SODIUM CHLORIDE 0.9 % IV BOLUS (SEPSIS): 500.0000 mL | INTRAVENOUS | Status: DC

## 2015-05-27 MED ORDER — IOHEXOL 300 MG/ML  SOLN
25.0000 mL | Freq: Once | INTRAMUSCULAR | Status: AC | PRN
  Administered 2015-05-27: 25 mL via ORAL

## 2015-05-27 MED ORDER — FENTANYL CITRATE (PF) 100 MCG/2ML IJ SOLN
50.0000 ug | Freq: Once | INTRAMUSCULAR | Status: AC
  Administered 2015-05-27: 50 ug via INTRAVENOUS
  Filled 2015-05-27: qty 2

## 2015-05-27 MED ORDER — SODIUM CHLORIDE 0.9 % IV SOLN
INTRAVENOUS | Status: DC
  Administered 2015-05-27: 22:00:00 via INTRAVENOUS

## 2015-05-27 MED ORDER — MOMETASONE FURO-FORMOTEROL FUM 100-5 MCG/ACT IN AERO
2.0000 | INHALATION_SPRAY | Freq: Two times a day (BID) | RESPIRATORY_TRACT | Status: DC
  Administered 2015-05-28 – 2015-05-30 (×5): 2 via RESPIRATORY_TRACT
  Filled 2015-05-27: qty 8.8

## 2015-05-27 MED ORDER — VANCOMYCIN HCL IN DEXTROSE 1-5 GM/200ML-% IV SOLN: 1000.0000 mg | Freq: Once | INTRAVENOUS | Status: DC

## 2015-05-27 MED ORDER — TOPIRAMATE 25 MG PO TABS
50.0000 mg | ORAL_TABLET | Freq: Two times a day (BID) | ORAL | Status: DC
  Administered 2015-05-28 – 2015-05-30 (×6): 50 mg via ORAL
  Filled 2015-05-27 (×7): qty 2

## 2015-05-27 MED ORDER — ONDANSETRON HCL 4 MG/2ML IJ SOLN
4.0000 mg | Freq: Once | INTRAMUSCULAR | Status: AC
  Administered 2015-05-27: 4 mg via INTRAVENOUS
  Filled 2015-05-27: qty 2

## 2015-05-27 MED ORDER — ADENOSINE 6 MG/2ML IV SOLN
6.0000 mg | Freq: Once | INTRAVENOUS | Status: AC
Start: 2015-05-27 — End: 2015-05-27
  Administered 2015-05-27: 6 mg via INTRAVENOUS
  Filled 2015-05-27: qty 2

## 2015-05-27 MED ORDER — SODIUM CHLORIDE 0.9 % IV SOLN
1000.0000 mL | Freq: Once | INTRAVENOUS | Status: AC
  Administered 2015-05-28: 1000 mL via INTRAVENOUS

## 2015-05-27 MED ORDER — SODIUM CHLORIDE 0.9 % IV SOLN
INTRAVENOUS | Status: AC
  Administered 2015-05-28 (×2): via INTRAVENOUS

## 2015-05-27 MED ORDER — AMIODARONE HCL IN DEXTROSE 360-4.14 MG/200ML-% IV SOLN
60.0000 mg/h | Freq: Once | INTRAVENOUS | Status: AC
  Administered 2015-05-27: 60 mg/h via INTRAVENOUS
  Filled 2015-05-27: qty 200

## 2015-05-27 MED ORDER — PIPERACILLIN-TAZOBACTAM 3.375 G IVPB 30 MIN
3.3750 g | Freq: Once | INTRAVENOUS | Status: AC
  Administered 2015-05-27: 3.375 g via INTRAVENOUS
  Filled 2015-05-27: qty 50

## 2015-05-27 MED ORDER — ADENOSINE 6 MG/2ML IV SOLN
6.0000 mg | Freq: Once | INTRAVENOUS | Status: AC
  Administered 2015-05-27: 6 mg via INTRAVENOUS
  Filled 2015-05-27: qty 2

## 2015-05-27 MED ORDER — PIPERACILLIN-TAZOBACTAM 3.375 G IVPB
3.3750 g | Freq: Three times a day (TID) | INTRAVENOUS | Status: DC
  Administered 2015-05-28 – 2015-05-29 (×5): 3.375 g via INTRAVENOUS
  Filled 2015-05-27 (×6): qty 50

## 2015-05-27 NOTE — Progress Notes (Signed)
Patient ID: Jennifer [name], female   DOB: Jul 21, 1941, 74 y.o.   MRN: [id number]    DATE: 05/27/15  Location:  [facility]    Place of Service: SNF [phone])   Extended Emergency Contact Information Primary Emergency Contact: Swanson,Beverly Address: [address]          [place] Phone: [phone] Mobile Phone: [phone] Relation: Daughter  Advanced Directive information  FULL CODE  Chief Complaint  Patient presents with  . Acute Visit    bloody drainage in T-tube    HPI:  74 yo female seen today at SNF for eval of bloody d/c from T-tube. She was seen in the ER last weekend for same thing. Cholangiogram showed patent T-tube and no acute process. Per ED record, she needed to have the tuble clamped by IR within 24 hrs and bag re-instituted. She was seen by surgery last week and no changes made to her tx regiman. She c/o abdominal pain today and unable to participate in PT/OT. No f/c. She is taking all meds as ordered. She has decreased po intake due to nausea and abdominal pain. No BM in last few days. Her daughter is present today.   afib rate controlled  Past Medical History  Diagnosis Date  . COPD (chronic obstructive pulmonary disease)     O2 dependent. Followed by Dr. Joya Gaskins  . Fibromyalgia   . Hypertension   . OSA (obstructive sleep apnea)     Sleep study done April 02, 2008 showed severe obstructive sleep apnea.  . Diastolic dysfunction     Grade I diastolic dysfunction as shown on ECHO Dec 2011.  EF of 65-70%.  Marland Kitchen COPD (chronic obstructive pulmonary disease)     Followed by Dr. Joya Gaskins  . Dyslipidemia   . Osteopenia     DXA scan done on 10/23/2009 showed osteopenia of AP spine (young adult T-score -1.3), osteopenia of left femur neck (young adult T-score -1.8), and normal bone density of right femur neck (young adult T-score -0.8), with a FRAX estimate of the 10-year probability of a major osteoporotic fracture  of 9.7% and probability of hip fracture 1.6%.  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 Estanislado Pandy on 11/10/2004.  MRA of the head on 03/09/2012 showed no evidence for recanalization.  . Osteoarthrosis involving multiple sites   . Chronic back pain   . Depression   . Anxiety   . Anemia   . GERD (gastroesophageal reflux disease)   . Meniere disease     Followed by ENT Dr. Silvestre Moment  . Diverticulosis of colon   . Multiple pigmented nevi   . Hypokalemia   . S/P hysterectomy 1975  . Pneumonia 11/2004  . Knee pain   . Dysphagia   . Urosepsis 11/04/2010    Klebsiella pneumoniae  . Breast pain   . Allergic rhinitis   . Fibromyalgia   . 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     . Urinary incontinence     Past Surgical History  Procedure Laterality Date  . Abdominal hysterectomy  1975  . 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 Estanislado Pandy on 11/10/2004.    Patient Care Team: Bertha Stakes, MD as PCP - General Elsie Stain, MD (Pulmonary Disease)  History   Social History  . Marital Status: Widowed    Spouse Name: N/A  . Number of Children: N/A  . Years of Education: N/A   Occupational History  . Not on file.   Social History Main Topics  . Smoking status: Former Smoker -- 0.50 packs/day for 30 years    Types: Cigarettes    Quit date: 12/07/2008  . Smokeless tobacco: Former Systems developer    Types: [place] date: 12/06/1978  . Alcohol Use: No  . Drug Use: No  . Sexual Activity: No   Other Topics Concern  . Not on file   Social History Narrative   Financial assistance approved for 100%  discount after Medicare pays for [facility] only, not eligible for [facility] card. Per Bonna Gains     reports that she quit smoking about 6 years ago. Her smoking use included Cigarettes. She has a 15 pack-year smoking history. She quit smokeless tobacco use about 36 years ago. Her smokeless tobacco use included Chew. She reports that she does not drink alcohol or use illicit drugs.  Immunization History  Administered Date(s) Administered  . H1N1 11/20/2008  . Influenza Split 10/08/2011, 08/22/2012  . Influenza Whole 10/06/2005, 10/06/2006, 09/12/2008, 09/02/2009, 10/28/2010  . Influenza,inj,Quad PF,36+ Mos 08/15/2013, 10/14/2014  . Pneumococcal Conjugate-13 12/12/2014  . Pneumococcal Polysaccharide-23 09/27/2002, 11/12/2009  . Td 11/18/2010  . Zoster 09/07/2012    Allergies  Allergen Reactions  . Tetracycline Other (See Comments)    REACTION: stomach upset  . Flonase [Fluticasone] Cough  . Lisinopril Cough    Medications: Patient's Medications  New Prescriptions   No medications on file  Previous Medications   ALBUTEROL (PROAIR HFA) 108 (90 BASE) MCG/ACT INHALER    INHALE 2 PUFFS BY MOUTH INTO LUNGS FOUR TIMES DAILY AS NEEDED   ALPRAZOLAM (XANAX) 0.5 MG TABLET    Take 1 tablet (0.5 mg total) by mouth 2 (two) times daily as needed for anxiety.   AMITRIPTYLINE (ELAVIL) 50 MG TABLET    Take 1 tablet (50 mg total) by mouth at bedtime.   AMOXICILLIN-CLAVULANATE (AUGMENTIN) 875-125 MG PER TABLET    Take 1 tablet by mouth every 12 (twelve) hours.   ASPIRIN 81 MG EC TABLET    Take 81 mg by mouth daily.     CARISOPRODOL (SOMA) 350 MG TABLET    Take 1 tablet (350 mg total) by mouth daily as needed for muscle spasms.   CHOLECALCIFEROL (VITAMIN D3) 1000 UNITS CAPS    Take 1 capsule (1,000 Units total) by mouth daily.   CINNAMON PO    Take 1 tablet by mouth 2 (two) times daily.    DEXTROMETHORPHAN-GUAIFENESIN (MUCINEX DM) 30-600 MG PER 12 HR TABLET    Take 1 tablet by mouth 2 (two) times daily.    DOCUSATE SODIUM (COLACE) 100 MG CAPSULE    Take 100 mg by mouth daily as needed for mild constipation or moderate constipation.   FLUTICASONE-SALMETEROL (ADVAIR DISKUS) 250-50 MCG/DOSE AEPB    Inhale 1 puff into the lungs 2 (two) times daily.   HYDROCHLOROTHIAZIDE (HYDRODIURIL) 25 MG TABLET    Take 0.5 tablets (12.5 mg total) by mouth daily.   HYDROCODONE-ACETAMINOPHEN (NORCO/VICODIN) 5-325 MG PER TABLET    Take 2 tablets by mouth every 4 (four) hours as needed.   HYDROXYPROPYL METHYLCELLULOSE (ISOPTO TEARS) 2.5 % OPHTHALMIC  SOLUTION    Place 2 drops into both eyes 3 (three) times daily as needed. Dry eye   IBUPROFEN (ADVIL,MOTRIN) 200 MG TABLET    Take 200 mg by mouth every 6 (six) hours as needed for moderate pain. For pain    MECLIZINE (ANTIVERT) 12.5 MG TABLET    Take 1 tablet (12.5 mg total) by mouth 2 (two) times daily as needed for dizziness.   MULTIPLE VITAMIN (MULTIVITAMIN) TABLET    Take 1 tablet by mouth daily.   NIFEDIPINE (ADALAT CC) 90 MG 24 HR TABLET    Take 1 tablet (90 mg total) by mouth daily.   OMEPRAZOLE (PRILOSEC) 20 MG CAPSULE    Take 1 capsule (20 mg total) by mouth 2 (two) times daily.   ONDANSETRON (ZOFRAN ODT) 4 MG DISINTEGRATING TABLET    Take 1 tablet (4 mg total) by mouth every 8 (eight) hours as needed for nausea.   POTASSIUM CHLORIDE SA (K-DUR,KLOR-CON) 20 MEQ TABLET    Take 20 mEq by mouth 2 (two) times daily.   RANITIDINE (ZANTAC) 150 MG TABLET    Take 1 tablet (150 mg total) by mouth at bedtime.   RED YEAST RICE EXTRACT (RED YEAST RICE PO)    Take 1 tablet by mouth 2 (two) times daily.    SERTRALINE (ZOLOFT) 100 MG TABLET    Take 1 tablet (100 mg total) by mouth 2 (two) times daily.   TIOTROPIUM (SPIRIVA HANDIHALER) 18 MCG INHALATION CAPSULE    Place 1 capsule (18 mcg total) into inhaler and inhale daily.   TOPIRAMATE (TOPAMAX) 50 MG TABLET    Take 1 tablet (50 mg total) by mouth 2 (two) times daily.   TRIAMCINOLONE CREAM (KENALOG) 0.1 %    APPLY TOPICALLY TO THE  AFFECTED AREA OF HAND TWICE DAILY AS NEEDED  Modified Medications   No medications on file  Discontinued Medications   No medications on file    Review of Systems  Unable to perform ROS: Psychiatric disorder    Filed Vitals:   05/27/15 1806  BP: 99/47  Pulse: 92  Temp: 98.5 F (36.9 C)   There is no weight on file to calculate BMI.  Physical Exam  Constitutional: She appears well-developed and well-nourished. No distress.  Cardiovascular:  No LE edema b/l. No calf TTP  Abdominal: Bowel sounds are normal. She exhibits distension. She exhibits no mass. There is tenderness (general). There is no rebound and no guarding.  T tube with dark red drainage noted; dsg c/d/i  Neurological: She is alert.  Skin: Skin is warm and dry. No rash noted.  Psychiatric: Her behavior is normal. Her mood appears anxious.     Labs reviewed: Admission on 05/27/2015  Component Date Value Ref Range Status  . WBC 05/27/2015 15.5* 4.0 - 10.5 K/uL Final  . RBC 05/27/2015 3.46* 3.87 - 5.11 MIL/uL Final  . Hemoglobin 05/27/2015 11.3* 12.0 - 15.0 g/dL Final  . HCT 05/27/2015 33.4* 36.0 - 46.0 % Final  . MCV 05/27/2015 96.5  78.0 - 100.0 fL Final  . MCH 05/27/2015 32.7  26.0 - 34.0 pg Final  . MCHC 05/27/2015 33.8  30.0 - 36.0 g/dL Final  . RDW 05/27/2015 14.1  11.5 - 15.5 % Final  . Platelets 05/27/2015 236  150 - 400 K/uL Final  . Neutrophils Relative % 05/27/2015 79* 43 - 77 % Final  . Neutro Abs 05/27/2015 12.1* 1.7 - 7.7 K/uL Final  . Lymphocytes Relative 05/27/2015 10* 12 - 46 %  Final  . Lymphs Abs 05/27/2015 1.6  0.7 - 4.0 K/uL Final  . Monocytes Relative 05/27/2015 11  3 - 12 % Final  . Monocytes Absolute 05/27/2015 1.7* 0.1 - 1.0 K/uL Final  . Eosinophils Relative 05/27/2015 0  0 - 5 % Final  . Eosinophils Absolute 05/27/2015 0.0  0.0 - 0.7 K/uL Final  . Basophils Relative 05/27/2015 0  0 - 1 % Final  . Basophils Absolute 05/27/2015 0.0  0.0 - 0.1 K/uL Final  . Sodium 05/27/2015 132* 135  - 145 mmol/L Final  . Potassium 05/27/2015 2.8* 3.5 - 5.1 mmol/L Final  . Chloride 05/27/2015 97* 101 - 111 mmol/L Final  . CO2 05/27/2015 23  22 - 32 mmol/L Final  . Glucose, Bld 05/27/2015 119* 65 - 99 mg/dL Final  . BUN 05/27/2015 13  6 - 20 mg/dL Final  . Creatinine, Ser 05/27/2015 0.68  0.44 - 1.00 mg/dL Final  . Calcium 05/27/2015 8.9  8.9 - 10.3 mg/dL Final  . Total Protein 05/27/2015 6.4* 6.5 - 8.1 g/dL Final  . Albumin 05/27/2015 2.5* 3.5 - 5.0 g/dL Final  . AST 05/27/2015 143* 15 - 41 U/L Final  . ALT 05/27/2015 523* 14 - 54 U/L Final  . Alkaline Phosphatase 05/27/2015 249* 38 - 126 U/L Final  . Total Bilirubin 05/27/2015 3.1* 0.3 - 1.2 mg/dL Final  . GFR calc non Af Amer 05/27/2015 >60  >60 mL/min Final  . GFR calc Af Amer 05/27/2015 >60  >60 mL/min Final   Comment: (NOTE) The eGFR has been calculated using the CKD EPI equation. This calculation has not been validated in all clinical situations. eGFR's persistently <60 mL/min signify possible Chronic Kidney Disease.   . Anion gap 05/27/2015 12  5 - 15 Final  . Lipase 05/27/2015 11* 22 - 51 U/L Final  . Color, Urine 05/27/2015 AMBER* YELLOW Final   BIOCHEMICALS MAY BE AFFECTED BY COLOR  . APPearance 05/27/2015 HAZY* CLEAR Final  . Specific Gravity, Urine 05/27/2015 1.016  1.005 - 1.030 Final  . pH 05/27/2015 5.5  5.0 - 8.0 Final  . Glucose, UA 05/27/2015 NEGATIVE  NEGATIVE mg/dL Final  . Hgb urine dipstick 05/27/2015 NEGATIVE  NEGATIVE Final  . Bilirubin Urine 05/27/2015 MODERATE* NEGATIVE Final  . Ketones, ur 05/27/2015 NEGATIVE  NEGATIVE mg/dL Final  . Protein, ur 05/27/2015 NEGATIVE  NEGATIVE mg/dL Final  . Urobilinogen, UA 05/27/2015 0.2  0.0 - 1.0 mg/dL Final  . Nitrite 05/27/2015 NEGATIVE  NEGATIVE Final  . Leukocytes, UA 05/27/2015 SMALL* NEGATIVE Final  . Squamous Epithelial / LPF 05/27/2015 MANY* RARE Final  . WBC, UA 05/27/2015 0-2  <3 WBC/hpf Final  . Bacteria, UA 05/27/2015 RARE  RARE Final  . Casts  05/27/2015 GRANULAR CAST* NEGATIVE Final  . Urine-Other 05/27/2015 MUCOUS PRESENT   Final  . Lactic Acid, Venous 05/27/2015 0.95  0.5 - 2.0 mmol/L Final  Admission on 05/25/2015, Discharged on 05/25/2015  Component Date Value Ref Range Status  . WBC 05/25/2015 8.3  4.0 - 10.5 K/uL Final  . RBC 05/25/2015 3.73* 3.87 - 5.11 MIL/uL Final  . Hemoglobin 05/25/2015 12.2  12.0 - 15.0 g/dL Final  . HCT 05/25/2015 36.7  36.0 - 46.0 % Final  . MCV 05/25/2015 98.4  78.0 - 100.0 fL Final  . MCH 05/25/2015 32.7  26.0 - 34.0 pg Final  . MCHC 05/25/2015 33.2  30.0 - 36.0 g/dL Final  . RDW 05/25/2015 13.3  11.5 - 15.5 % Final  . Platelets 05/25/2015  210  150 - 400 K/uL Final  . Neutrophils Relative % 05/25/2015 87* 43 - 77 % Final  . Neutro Abs 05/25/2015 7.2  1.7 - 7.7 K/uL Final  . Lymphocytes Relative 05/25/2015 7* 12 - 46 % Final  . Lymphs Abs 05/25/2015 0.6* 0.7 - 4.0 K/uL Final  . Monocytes Relative 05/25/2015 6  3 - 12 % Final  . Monocytes Absolute 05/25/2015 0.5  0.1 - 1.0 K/uL Final  . Eosinophils Relative 05/25/2015 0  0 - 5 % Final  . Eosinophils Absolute 05/25/2015 0.0  0.0 - 0.7 K/uL Final  . Basophils Relative 05/25/2015 0  0 - 1 % Final  . Basophils Absolute 05/25/2015 0.0  0.0 - 0.1 K/uL Final  . Sodium 05/25/2015 136  135 - 145 mmol/L Final  . Potassium 05/25/2015 3.4* 3.5 - 5.1 mmol/L Final  . Chloride 05/25/2015 107  101 - 111 mmol/L Final  . CO2 05/25/2015 22  22 - 32 mmol/L Final  . Glucose, Bld 05/25/2015 202* 65 - 99 mg/dL Final  . BUN 05/25/2015 10  6 - 20 mg/dL Final  . Creatinine, Ser 05/25/2015 0.58  0.44 - 1.00 mg/dL Final  . Calcium 05/25/2015 9.3  8.9 - 10.3 mg/dL Final  . Total Protein 05/25/2015 7.0  6.5 - 8.1 g/dL Final  . Albumin 05/25/2015 3.0* 3.5 - 5.0 g/dL Final  . AST 05/25/2015 1065* 15 - 41 U/L Final  . ALT 05/25/2015 493* 14 - 54 U/L Final  . Alkaline Phosphatase 05/25/2015 240* 38 - 126 U/L Final  . Total Bilirubin 05/25/2015 1.6* 0.3 - 1.2 mg/dL Final    . GFR calc non Af Amer 05/25/2015 >60  >60 mL/min Final  . GFR calc Af Amer 05/25/2015 >60  >60 mL/min Final   Comment: (NOTE) The eGFR has been calculated using the CKD EPI equation. This calculation has not been validated in all clinical situations. eGFR's persistently <60 mL/min signify possible Chronic Kidney Disease.   . Anion gap 05/25/2015 7  5 - 15 Final  . Lipase 05/25/2015 14* 22 - 51 U/L Final  . Troponin I 05/25/2015 <0.03  <0.031 ng/mL Final   Comment:        NO INDICATION OF MYOCARDIAL INJURY.   Admission on 04/25/2015, Discharged on 05/02/2015  No results displayed because visit has over 200 results.      Ir Sinus/fist Tube Chk-non Gi  05/27/2015   CLINICAL DATA:  Hemobilia, status post cholecystostomy, tachycardia, diaphoresis  EXAM: Fluoroscopic injection of the existing cholecystostomy  Date:  6/21/20166/21/2016 3:38 pm  Radiologist:  M. Daryll Brod, MD  Guidance:  Fluoroscopic  FLUOROSCOPY TIME:  54 seconds, 50 mGy  MEDICATIONS AND MEDICAL HISTORY: None.  ANESTHESIA/SEDATION: None.  CONTRAST:  10m OMNIPAQUE IOHEXOL 300 MG/ML  SOLN  COMPLICATIONS: None immediate  PROCEDURE: Informed consent was obtained from the patient following explanation of the procedure, risks, benefits and alternatives. The patient understands, agrees and consents for the procedure. All questions were addressed. A time out was performed.  Under sterile conditions, the existing cholecystostomy was injected with contrast under fluoroscopy. Images obtained for documentation.  Cholangiogram: Stable position of the cholecystostomy tube within the gallbladder. Filling defects throughout the gallbladder could represent blood clots and/or stones. Cystic duct is patent. Contrast does reach the common hepatic duct. No communication to be any adjacent vascular structure to account for the hemobilia.  IMPRESSION: Cholecystostomy in good position.  Patent cystic duct.  Gallbladder diffuse filling defect compatible  with blood clot  versus cholelithiasis.   Electronically Signed   By: Jerilynn Mages.  Shick M.D.   On: 05/27/2015 16:26   Dg Chest Port 1 View  05/27/2015   CLINICAL DATA:  Abdominal pain and tachycardia.  EXAM: PORTABLE CHEST - 1 VIEW  COMPARISON:  05/25/2015  FINDINGS: Portable view of the chest demonstrates low lung volumes. Difficult to exclude vascular congestion. Heart size is accentuated by the low lung volumes. Negative for pneumothorax.  IMPRESSION: Low lung volumes without focal disease. Difficult to exclude vascular congestion.   Electronically Signed   By: Markus Daft M.D.   On: 05/27/2015 17:54   Dg Chest Port 1 View  05/25/2015   CLINICAL DATA:  Acute chest pain.  EXAM: PORTABLE CHEST - 1 VIEW  COMPARISON:  Apr 25, 2015.  FINDINGS: Stable cardiomediastinal silhouette. No pneumothorax or pleural effusion is noted. Both lungs are clear. The visualized skeletal structures are unremarkable.  IMPRESSION: No acute cardiopulmonary abnormality seen.   Electronically Signed   By: Marijo Conception, M.D.   On: 05/25/2015 10:20   Ir Cholangiogram Existing Tube  05/27/2015   CLINICAL DATA:  Hemobilia. Examination of the drainage to for the cholecystostomy tube demonstrates predominately serosanguineous fluid and a few scattered very dark gold blood clots. There is no bright red blood in the drainage bag.  EXAM: CHOLANGIOGRAM VIA EXISTING CATHETER  PROCEDURE: Contrast was injected into the cholecystostomy tube and imaging was obtained.  FINDINGS: There are filling defects in the gallbladder and cystic duct compatible with thrombus. The cystic duct is patent.  IMPRESSION: The cystic duct is patent. Blood clots are present in the gallbladder. Recommendations are for drain capping and resuming bag drainage tomorrow.   Electronically Signed   By: Marybelle Killings M.D.   On: 05/27/2015 07:51     Assessment/Plan   ICD-9-CM ICD-10-CM   1. Cholecystostomy tube dysfunction, initial encounter - BLOODY DRAINAGE 996.59 T85.598A     2. Cholecystitis, acute 575.0 K81.0   3. Generalized abdominal pain - due to #2 789.07 R10.84   4. Paroxysmal atrial fibrillation -rate controlled 427.31 I48.0     --send to IR for further eval of T-tube per ED summary. She is unable to participate in rehab due to abdominal pain  --cont other meds as ordered  --will follow  Mikenzie Mccannon S. Perlie Gold  [facility] [address] [place], [place] [address] [phone] Cell (Monday-Friday 8 AM - 5 PM) [phone] After 5 PM and follow prompts

## 2015-05-27 NOTE — ED Notes (Signed)
Heart rate reported to Dr Vanita Panda

## 2015-05-27 NOTE — H&P (Signed)
Date: 05/27/2015               Patient Name:  Jennifer [name] MRN: [id number]  DOB: 23-Aug-1941 Age / Sex: 74 y.o., female   PCP: Bertha Stakes, MD         Medical Service: Internal Medicine Teaching Service         Attending Physician: Dr. Madilyn Fireman, MD    First Contact: Dr. Genene Churn Pager: [phone]  Second Contact: Dr. Naaman Plummer Pager: [phone]       After Hours (After 5p/  First Contact Pager: [phone]  weekends / holidays): Second Contact Pager: [phone]   Chief Complaint: abd pain and bloody drainage from cholecystostomy tube  History of Present Illness: Pt is a 74 y/o F w/ PMHx of HTN, CHF, COPD, and fibromyalgia who presents with abd pain and bloody drainage from cholecystostomy tube. She was last admitted on 5/20 for acute cholecystitis w/placement of cholecystostomy drainage tube as she was not a good candidate for cholecystectomy. Last evaluated in the ED on 6/19 for abd pain w/ blood in drainage bag. Choloangiogram at that time found that the cystic duct was patent and the drain was then capped and she was to be re evaluated in 24 hours by radiology to reinstitute her drainage bag. However today while at her SNF she had increased bloody drainage and was noted to be tachycardic so then came to the ED. She has has been nauseous and vomited a small "thumbsize " amt of blood yesterday. She has difficulty eating and drinking due to abd pain but has been able to tolerate po and liquids. She had dyspnea, fevers, and night sweats. Abd pain is a dull ache located diffusely across her abdomen that is worsened with movement. Her last BM was Saturday. CT of abd and pelvis in the ED revealed an acute thrombosis of the branches of the portal vein to the lateral and medial segment of the left lobe. IR was consulted and recommended holding off on anticoagulation in setting of current bleeding from cholecystostomy tube and they will see him in the morning for evaluation.  While in the ED pt become  tachycardic up to the 250's, tele revealed SVT. She was given adenosine twice with resolution of tachycardia. She then had another episode of SVT that self resolved. She was then started on an amio gtt. She was also started on vanc and zosyn.   Meds: Current Facility-Administered Medications  Medication Dose Route Frequency Provider Last Rate Last Dose  . iohexol (OMNIPAQUE) 300 MG/ML solution 100 mL  100 mL Intravenous Once PRN Medication Radiologist, MD      . Derrill Memo ON 05/28/2015] piperacillin-tazobactam (ZOSYN) IVPB 3.375 g  3.375 g Intravenous 3 times per day Otilio Miu, [facility]      . vancomycin (VANCOCIN) 1,250 mg in sodium chloride 0.9 % 250 mL IVPB  1,250 mg Intravenous Q12H Otilio Miu, RPH 166.7 mL/hr at 05/27/15 1831 1,250 mg at 05/27/15 1831   Current Outpatient Prescriptions  Medication Sig Dispense Refill  . albuterol (PROAIR HFA) 108 (90 BASE) MCG/ACT inhaler INHALE 2 PUFFS BY MOUTH INTO LUNGS FOUR TIMES DAILY AS NEEDED (Patient taking differently: Inhale 1-2 puffs into the lungs every 6 (six) hours as needed for wheezing or shortness of breath. ) 3 Inhaler 6  . ALPRAZolam (XANAX) 0.5 MG tablet Take 1 tablet (0.5 mg total) by mouth 2 (two) times daily as needed for anxiety. 20 tablet 0  . amitriptyline (ELAVIL) 50 MG tablet  Take 1 tablet (50 mg total) by mouth at bedtime. 30 tablet 3  . amoxicillin-clavulanate (AUGMENTIN) 875-125 MG per tablet Take 1 tablet by mouth every 12 (twelve) hours. 1 tablet 0  . aspirin 81 MG EC tablet Take 81 mg by mouth daily.      . carisoprodol (SOMA) 350 MG tablet Take 1 tablet (350 mg total) by mouth daily as needed for muscle spasms. 30 tablet 3  . Cholecalciferol (VITAMIN D3) 1000 UNITS CAPS Take 1 capsule (1,000 Units total) by mouth daily. 30 capsule 2  . CINNAMON PO Take 1 tablet by mouth 2 (two) times daily.     Marland Kitchen dextromethorphan-guaiFENesin (MUCINEX DM) 30-600 MG per 12 hr tablet Take 1 tablet by mouth 2 (two) times daily.    Marland Kitchen  docusate sodium (COLACE) 100 MG capsule Take 100 mg by mouth daily as needed for mild constipation or moderate constipation.    . Fluticasone-Salmeterol (ADVAIR DISKUS) 250-50 MCG/DOSE AEPB Inhale 1 puff into the lungs 2 (two) times daily. 180 each 4  . hydrochlorothiazide (HYDRODIURIL) 25 MG tablet Take 0.5 tablets (12.5 mg total) by mouth daily. 45 tablet 3  . HYDROcodone-acetaminophen (NORCO/VICODIN) 5-325 MG per tablet Take 2 tablets by mouth every 4 (four) hours as needed. (Patient taking differently: Take 2 tablets by mouth every 4 (four) hours as needed for moderate pain. ) 10 tablet 0  . hydroxypropyl methylcellulose (ISOPTO TEARS) 2.5 % ophthalmic solution Place 2 drops into both eyes 3 (three) times daily as needed. Dry eye    . ibuprofen (ADVIL,MOTRIN) 200 MG tablet Take 200 mg by mouth every 6 (six) hours as needed for moderate pain. For pain     . meclizine (ANTIVERT) 12.5 MG tablet Take 1 tablet (12.5 mg total) by mouth 2 (two) times daily as needed for dizziness. 180 tablet 0  . Multiple Vitamin (MULTIVITAMIN) tablet Take 1 tablet by mouth daily.    Marland Kitchen NIFEdipine (ADALAT CC) 90 MG 24 hr tablet Take 1 tablet (90 mg total) by mouth daily. 90 tablet 2  . omeprazole (PRILOSEC) 20 MG capsule Take 1 capsule (20 mg total) by mouth 2 (two) times daily. 180 capsule 3  . ondansetron (ZOFRAN ODT) 4 MG disintegrating tablet Take 1 tablet (4 mg total) by mouth every 8 (eight) hours as needed for nausea. 20 tablet 0  . potassium chloride SA (K-DUR,KLOR-CON) 20 MEQ tablet Take 20 mEq by mouth 2 (two) times daily.    . ranitidine (ZANTAC) 150 MG tablet Take 1 tablet (150 mg total) by mouth at bedtime. 90 tablet 1  . Red Yeast Rice Extract (RED YEAST RICE PO) Take 1 tablet by mouth 2 (two) times daily.     . sertraline (ZOLOFT) 100 MG tablet Take 1 tablet (100 mg total) by mouth 2 (two) times daily. 180 tablet 1  . tiotropium (SPIRIVA HANDIHALER) 18 MCG inhalation capsule Place 1 capsule (18 mcg total)  into inhaler and inhale daily. 90 capsule 4  . topiramate (TOPAMAX) 50 MG tablet Take 1 tablet (50 mg total) by mouth 2 (two) times daily. 60 tablet 2  . triamcinolone cream (KENALOG) 0.1 % APPLY TOPICALLY TO THE AFFECTED AREA OF HAND TWICE DAILY AS NEEDED (Patient not taking: Reported on 05/25/2015) 45 g 1    Allergies: Allergies as of 05/27/2015 - Review Complete 05/27/2015  Allergen Reaction Noted  . Tetracycline Other (See Comments) 12/29/2006  . Flonase [fluticasone] Cough 08/12/2012  . Lisinopril Cough 01/06/2011   Past Medical History  Diagnosis  Date  . COPD (chronic obstructive pulmonary disease)     O2 dependent. Followed by Dr. Joya Gaskins  . Fibromyalgia   . Hypertension   . OSA (obstructive sleep apnea)     Sleep study done April 02, 2008 showed severe obstructive sleep apnea.  . Diastolic dysfunction     Grade I diastolic dysfunction as shown on ECHO Dec 2011.  EF of 65-70%.  Marland Kitchen COPD (chronic obstructive pulmonary disease)     Followed by Dr. Joya Gaskins  . Dyslipidemia   . Osteopenia     DXA scan done on 10/23/2009 showed osteopenia of AP spine (young adult T-score -1.3), osteopenia of left femur neck (young adult T-score -1.8), and normal bone density of right femur neck (young adult T-score -0.8), with a FRAX estimate of the 10-year probability of a major osteoporotic fracture of 9.7% and probability of hip fracture 1.6%.  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 Estanislado Pandy on 11/10/2004.  MRA of the head on 03/09/2012 showed no evidence for recanalization.  . Osteoarthrosis involving multiple sites   . Chronic back pain   . Depression   . Anxiety   . Anemia   . GERD (gastroesophageal reflux disease)   . Meniere disease     Followed by ENT Dr. Silvestre Moment  . Diverticulosis of colon   . Multiple pigmented nevi   . Hypokalemia   . S/P hysterectomy 1975  . Pneumonia 11/2004  . Knee pain   . Dysphagia     . Urosepsis 11/04/2010    Klebsiella pneumoniae  . Breast pain   . Allergic rhinitis   . Fibromyalgia   . 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     . Urinary incontinence    Past Surgical History  Procedure Laterality Date  . Abdominal hysterectomy  1975  . 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 Estanislado Pandy on 11/10/2004.   Family History  Problem Relation Age of Onset  . Ulcers Mother   . Ovarian cancer Sister 94  . Cervical cancer Daughter   . Cervical cancer Daughter   . Lung cancer Neg Hx   . Colon cancer Neg Hx   . Heart attack Father   . Cerebral aneurysm Sister 82   History   Social History  . Marital Status: Widowed    Spouse Name: N/A  . Number of Children: N/A  . Years of Education: N/A   Occupational History  . Not on file.   Social History Main Topics  . Smoking status: Former Smoker -- 0.50 packs/day for 30 years    Types: Cigarettes    Quit date: 12/07/2008  . Smokeless tobacco: Former Systems developer    Types: [place] date: 12/06/1978  . Alcohol Use: No  . Drug Use: No  . Sexual Activity: No   Other Topics Concern  . Not on file   Social History Narrative   Financial assistance approved for 100% discount after Medicare pays for [facility] only, not eligible for [facility] card. Per Bonna Gains    Review of Systems: Pertinent items are noted in HPI.  Physical Exam: Blood pressure 126/99, pulse 153, temperature 99.6  F (37.6 C), temperature source Rectal, resp. rate 21, height 5\' 3"  (1.6 m), weight 223 lb (101.152 kg), SpO2 95 %. Physical Exam  Constitutional: She appears well-developed.  HENT:  Mouth/Throat: Oropharynx is clear and moist.  Eyes:  Pupils are equal, round, and reactive to light. Scleral icterus (mild) is present.  Cardiovascular: Intact distal pulses.   Tachycardic up to 250's, no murmurs   Pulmonary/Chest: Effort normal. No respiratory distress.  Clear on ant ausucltation  Abdominal: Soft. There is tenderness.  RUQ Cholecystostomy tube in place w/o signs of infxn, blood in drainage bag  Musculoskeletal: She exhibits no edema.  Neurological: She is alert.  Skin: Skin is warm and dry. She is not diaphoretic.    Lab results: Basic Metabolic Panel:  Recent Labs  05/25/15 1006 05/27/15 1600  NA 136 132*  K 3.4* 2.8*  CL 107 97*  CO2 22 23  GLUCOSE 202* 119*  BUN 10 13  CREATININE 0.58 0.68  CALCIUM 9.3 8.9   Liver Function Tests:  Recent Labs  05/25/15 1006 05/27/15 1600  AST 1065* 143*  ALT 493* 523*  ALKPHOS 240* 249*  BILITOT 1.6* 3.1*  PROT 7.0 6.4*  ALBUMIN 3.0* 2.5*    Recent Labs  05/25/15 1006 05/27/15 1600  LIPASE 14* 11*   CBC:  Recent Labs  05/25/15 1006 05/27/15 1600  WBC 8.3 15.5*  NEUTROABS 7.2 12.1*  HGB 12.2 11.3*  HCT 36.7 33.4*  MCV 98.4 96.5  PLT 210 236   Lactic acid-- 0.95 > 0.97   Cardiac Enzymes:  Recent Labs  05/25/15 1006  TROPONINI <0.03   Urinalysis:  Recent Labs  05/27/15 1643  COLORURINE AMBER*  LABSPEC 1.016  PHURINE 5.5  GLUCOSEU NEGATIVE  HGBUR NEGATIVE  BILIRUBINUR MODERATE*  KETONESUR NEGATIVE  PROTEINUR NEGATIVE  UROBILINOGEN 0.2  NITRITE NEGATIVE  LEUKOCYTESUR SMALL*    Imaging results:  Ct Abdomen Pelvis W Contrast  05/27/2015   CLINICAL DATA:  Status post percutaneous cholecystostomy.  EXAM: CT ABDOMEN AND PELVIS WITH CONTRAST  TECHNIQUE: Multidetector CT imaging of the abdomen and pelvis was performed using the standard protocol following bolus administration of intravenous contrast.  CONTRAST:  100 cc of Omnipaque 300  COMPARISON:  04/25/2015  FINDINGS: Lower chest: Dependent changes are noted within the posterior  right lung base.  Hepatobiliary: A percutaneous cholecystostomy tube is identified and appears well suture rated within the gallbladder. The gallbladder appears decompressed. Small fluid collection ventral to the right hepatic lobe measures 2.3 x 3.0 x 4.5 cm, image 37/ series 2. This is mixed attenuation may represent a small biloma and/or hematoma. There is no significant biliary dilatation. 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 The hepatic veins appear patent.  Pancreas: Fatty replacement of the pancreas.  Spleen: Normal appearance of the spleen.  Adrenals/Urinary Tract: Left adrenal gland hypertrophy noted. Right adrenal gland appears normal. Unremarkable appearance of the kidneys. Urinary bladder appears normal.  Stomach/Bowel: The stomach is within normal limits. The small bowel loops have a normal course and caliber. No obstruction. Normal appearance of the colon.  Vascular/Lymphatic: Calcified atherosclerotic disease involves the abdominal aorta. There is an infrarenal abdominal aortic aneurysm which measures 3.7 cm, image 50/series 2. No enlarged retroperitoneal or mesenteric adenopathy. No enlarged pelvic or inguinal lymph nodes.  Reproductive: Previous hysterectomy.  No adnexal mass.  Other: None.  Musculoskeletal: Degenerative disc disease noted within the lower  thoracic and lumbar spine.  IMPRESSION: 1. Interval percutaneous cholecystostomy with decompression of the gallbladder. There is no evidence for biliary obstruction. 2. Small fluid collection ventral to the right lobe of liver is identified. This is mixed attenuation and may represent a small biloma and/or small hematoma. No large hematoma is identified at this time. 3. 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 4. Aortic atherosclerosis 5. Infrarenal abdominal aortic  aneurysm. Recommend followup by ultrasound in 2 years. This recommendation follows ACR consensus guidelines: White Paper of the ACR Incidental Findings Committee II on Vascular Findings. J Am Coll Radiol 2013; 10:789-794. 6. Critical Value/emergent results were called by telephone at the time of interpretation on 05/27/2015 at 7:44 pm to Dr. Carmin Muskrat , who verbally acknowledged these results.   Electronically Signed   By: Kerby Moors M.D.   On: 05/27/2015 19:44   Ir Sinus/fist Tube Chk-non Gi  05/27/2015   CLINICAL DATA:  Hemobilia, status post cholecystostomy, tachycardia, diaphoresis  EXAM: Fluoroscopic injection of the existing cholecystostomy  Date:  6/21/20166/21/2016 3:38 pm  Radiologist:  M. Daryll Brod, MD  Guidance:  Fluoroscopic  FLUOROSCOPY TIME:  54 seconds, 50 mGy  MEDICATIONS AND MEDICAL HISTORY: None.  ANESTHESIA/SEDATION: None.  CONTRAST:  72mL OMNIPAQUE IOHEXOL 300 MG/ML  SOLN  COMPLICATIONS: None immediate  PROCEDURE: Informed consent was obtained from the patient following explanation of the procedure, risks, benefits and alternatives. The patient understands, agrees and consents for the procedure. All questions were addressed. A time out was performed.  Under sterile conditions, the existing cholecystostomy was injected with contrast under fluoroscopy. Images obtained for documentation.  Cholangiogram: Stable position of the cholecystostomy tube within the gallbladder. Filling defects throughout the gallbladder could represent blood clots and/or stones. Cystic duct is patent. Contrast does reach the common hepatic duct. No communication to be any adjacent vascular structure to account for the hemobilia.  IMPRESSION: Cholecystostomy in good position.  Patent cystic duct.  Gallbladder diffuse filling defect compatible with blood clot versus cholelithiasis.   Electronically Signed   By: Jerilynn Mages.  Shick M.D.   On: 05/27/2015 16:26   Dg Chest Port 1 View  05/27/2015   CLINICAL DATA:   Abdominal pain and tachycardia.  EXAM: PORTABLE CHEST - 1 VIEW  COMPARISON:  05/25/2015  FINDINGS: Portable view of the chest demonstrates low lung volumes. Difficult to exclude vascular congestion. Heart size is accentuated by the low lung volumes. Negative for pneumothorax.  IMPRESSION: Low lung volumes without focal disease. Difficult to exclude vascular congestion.   Electronically Signed   By: Markus Daft M.D.   On: 05/27/2015 17:54    EKG Interpretation  Date/Time:  Tuesday May 27 2015 15:26:27 EDT Ventricular Rate:  140 PR Interval:  89 QRS Duration: 77 QT Interval:  305 QTC Calculation: 465 R Axis:   -15 Text Interpretation:  Sinus or ectopic atrial tachycardia Atrial premature complexes Left ventricular hypertrophy Inferior infarct, old Atrial fibrillation Artifact Abnormal ekg Confirmed by Carmin Muskrat  MD [phone]) on 05/27/2015 3:52:28 PM  Assessment & Plan by Problem: Active Problems:   Hepatic vein thrombosis  Atrial fibrillation with RVR-- pt has documented hx of afib not on anticoagulation. She was went into SVT twice that both resolved w/ adenosine. She also had another episode that self resolved. During each episode BP went to the 90's/50's. She denies chest pain, SOB, or decreased level of consciousness. Cardiology and PCCM were  both consulted by the ED provider. It was felt she was stable for step down bed and recommended starting pt on amiodarone gtt.   - cardiology recommendations appreciated, given digoxin 500 micrograms once and cont amio gtt - EKG in the am - consider anticoagulation on discharge  Acute portal vein thrombosis--CT of abd pelvis revealed an acute thrombosis of the branches of the portal vein to the lateral and medial segment of the left lobe. IR was consulted and recommended holding off on anticoagulation in setting of current bleeding from cholecystostomy tube and they will see him in the morning for evaluation. - norco 5-325 2 tabs q6h prn for  pain  Sepsis-- unknown source. HR 154, R 25, and WBCs  15.5 on admission. LA 0.95 >0.97. Recently admitted for acute cholecystitis on 5/20, she is having  abd pain w/ n/v. Cholangiogram revealed a filling defect in the gallbladder compatible w/ blood clot vs cholelithiasis. CXR neg for infxn.Could be probable abd source. Started on vanc and zosyn in the ED. - cont vanc and zosyn - blood cultures pending - NS at 100cc/hr  HTN-- hold home HCTZ 12.5 mg daily and nifedipine 90 mg daily due to soft BPs.   CHF-- Last TTE 04/2015 w/ EF of 55-60% w/ grade 1 diastolic dysfunction  COPD--on advair 1 puff bid, spiriva 18 mch inh daily, and albuterol 1-2 puff q6hprn -cont dulera1 puff bid, spiriva 18 mch inh daily, and albuterol 1-2 puff q6hprn  Fibromyalgia- at home she takes norco 7.5-325mg  QID prn, amitriptyline 50mg  qhs, carisoprodol 350mg  qd prn - pain management as above  OSA- cont CPAP qhs  Anxiety and depression-- cont home zoloft 100mg  BID and xanax 0.5mg  BID oprn  Migraines- cont home topamax 50mg  BID  FEN - diet- NPO  - hypokalemia, potassium 2.8. repeleted w/ 6 runs of KCl 10 meqs in the ED - mag level ordered - NS at 100cc/hr  DVT ppx--SCDs  CODE-- FULL  Dispo: Disposition is deferred at this time, awaiting improvement of current medical problems.   The patient does have a current PCP Bertha Stakes, MD) and does need an [facility] hospital follow-up appointment after discharge.  The patient does not have transportation limitations that hinder transportation to clinic appointments.  Signed: Norman Herrlich, MD 05/27/2015, 7:54 PM

## 2015-05-27 NOTE — Procedures (Signed)
Fluoro injection of the existing cholecystostomy Drain with in the GB and unchged since Sunday. No source of bleeding demonstrated. Cystic duct patent Full report in pacs

## 2015-05-27 NOTE — ED Notes (Signed)
Pt to department via [facility] from [facility]- pt was sent here for a scan in IR. [facility] reports that she did not have an appt there was brought here because she was tachycardiac and having large amounts of bloody drainage from her cholo-bag. Pt reports abd pain that increases with palpation. Hr-140 Bp-133/60

## 2015-05-27 NOTE — ED Provider Notes (Addendum)
CSN: [id number]     Arrival date & time 05/27/15  1507 History   First MD Initiated Contact with Patient 05/27/15 1522     Chief Complaint  Patient presents with  . Abdominal Pain  . Tachycardia    HPI  Patient presents with concern of abdominal pain, nausea, vomiting, bloody discharge from her biliary drainage bag. Patient is a notable history of sepsis, acute cholecystitis 1 month ago, requiring placement of biliary drain, as she was deemed to be a poor surgical candidate. She was here 2 days ago due to abdominal pain, bloody discharge. At that point she had cholangiogram, which was reassuring, but today, while preparing for repeat evaluation she developed increasing abdominal pain, increasing bloody discharge per She sounded be febrile, tachycardic, second here for evaluation. Patient describes severe pain throughout her abdomen, worse in the right upper quadrant, with nausea, vomiting, no relief from anything. Patient denies confusion, disorientation.   Past Medical History  Diagnosis Date  . COPD (chronic obstructive pulmonary disease)     O2 dependent. Followed by Dr. Joya Gaskins  . Fibromyalgia   . Hypertension   . OSA (obstructive sleep apnea)     Sleep study done April 02, 2008 showed severe obstructive sleep apnea.  . Diastolic dysfunction     Grade I diastolic dysfunction as shown on ECHO Dec 2011.  EF of 65-70%.  Marland Kitchen COPD (chronic obstructive pulmonary disease)     Followed by Dr. Joya Gaskins  . Dyslipidemia   . Osteopenia     DXA scan done on 10/23/2009 showed osteopenia of AP spine (young adult T-score -1.3), osteopenia of left femur neck (young adult T-score -1.8), and normal bone density of right femur neck (young adult T-score -0.8), with a FRAX estimate of the 10-year probability of a major osteoporotic fracture of 9.7% and probability of hip fracture 1.6%.  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  Estanislado Pandy on 11/10/2004.  MRA of the head on 03/09/2012 showed no evidence for recanalization.  . Osteoarthrosis involving multiple sites   . Chronic back pain   . Depression   . Anxiety   . Anemia   . GERD (gastroesophageal reflux disease)   . Meniere disease     Followed by ENT Dr. Silvestre Moment  . Diverticulosis of colon   . Multiple pigmented nevi   . Hypokalemia   . S/P hysterectomy 1975  . Pneumonia 11/2004  . Knee pain   . Dysphagia   . Urosepsis 11/04/2010    Klebsiella pneumoniae  . Breast pain   . Allergic rhinitis   . Fibromyalgia   . 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     . Urinary incontinence    Past Surgical History  Procedure Laterality Date  . Abdominal hysterectomy  1975  . 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 Estanislado Pandy on 11/10/2004.   Family History  Problem Relation Age of Onset  . Ulcers Mother   . Ovarian cancer Sister 47  . Cervical cancer Daughter   . Cervical cancer Daughter   . Lung cancer Neg Hx   . Colon cancer Neg Hx   . Heart attack Father   .  Cerebral aneurysm Sister 82   History  Substance Use Topics  . Smoking status: Former Smoker -- 0.50 packs/day for 30 years    Types: Cigarettes    Quit date: 12/07/2008  . Smokeless tobacco: Former Systems developer    Types: Christmas date: 12/06/1978  . Alcohol Use: No   OB History    No data available     Review of Systems  Constitutional:       Per HPI, otherwise negative  HENT:       Per HPI, otherwise negative  Respiratory:       Per HPI, otherwise negative  Cardiovascular:       Per HPI, otherwise negative  Gastrointestinal: Positive for nausea and vomiting.  Endocrine:        Negative aside from HPI  Genitourinary:       Neg aside from HPI   Musculoskeletal:       Per HPI, otherwise negative  Skin: Negative.   Neurological: Negative for syncope.      Allergies  Flonase; Lisinopril; and Tetracycline  Home Medications   Prior to Admission medications   Medication Sig Start Date End Date Taking? Authorizing Provider  albuterol (PROAIR HFA) 108 (90 BASE) MCG/ACT inhaler INHALE 2 PUFFS BY MOUTH INTO LUNGS FOUR TIMES DAILY AS NEEDED Patient taking differently: Inhale 1-2 puffs into the lungs every 6 (six) hours as needed for wheezing or shortness of breath.  10/14/14   Elsie Stain, MD  ALPRAZolam Duanne Moron) 0.5 MG tablet Take 1 tablet (0.5 mg total) by mouth 2 (two) times daily as needed for anxiety. 05/02/15   Rushil Sherrye Payor, MD  amitriptyline (ELAVIL) 50 MG tablet Take 1 tablet (50 mg total) by mouth at bedtime. 01/03/15   Bertha Stakes, MD  amoxicillin-clavulanate (AUGMENTIN) 875-125 MG per tablet Take 1 tablet by mouth every 12 (twelve) hours. 05/02/15   Rushil Sherrye Payor, MD  aspirin 81 MG EC tablet Take 81 mg by mouth daily.      Historical Provider, MD  carisoprodol (SOMA) 350 MG tablet Take 1 tablet (350 mg total) by mouth daily as needed for muscle spasms. 01/03/15   Bertha Stakes, MD  Cholecalciferol (VITAMIN D3) 1000 UNITS CAPS Take 1 capsule (1,000 Units total) by mouth daily. 01/23/15   Bertha Stakes, MD  CINNAMON PO Take by mouth 2 (two) times daily.    Historical Provider, MD  dextromethorphan-guaiFENesin (MUCINEX DM) 30-600 MG per 12 hr tablet Take 1 tablet by mouth 2 (two) times daily.    Historical Provider, MD  docusate sodium (COLACE) 100 MG capsule Take 100 mg by mouth daily as needed for mild constipation or moderate constipation.    Historical Provider, MD  Fluticasone-Salmeterol (ADVAIR DISKUS) 250-50 MCG/DOSE AEPB Inhale 1 puff into the lungs 2 (two) times daily. 10/14/14   Elsie Stain, MD  hydrochlorothiazide (HYDRODIURIL) 25 MG tablet Take  0.5 tablets (12.5 mg total) by mouth daily. 03/07/14   Bertha Stakes, MD  HYDROcodone-acetaminophen (NORCO/VICODIN) 5-325 MG per tablet Take 2 tablets by mouth every 4 (four) hours as needed. 05/25/15   Tanna Furry, MD  hydroxypropyl methylcellulose (ISOPTO TEARS) 2.5 % ophthalmic solution Place 2 drops into both eyes 3 (three) times daily as needed. Dry eye    Historical Provider, MD  ibuprofen (ADVIL,MOTRIN) 200 MG tablet Take 200 mg by mouth 4 (four) times daily as needed for moderate pain. For pain     Historical Provider, MD  meclizine (ANTIVERT) 12.5 MG tablet  Take 1 tablet (12.5 mg total) by mouth 2 (two) times daily as needed for dizziness. 11/20/14   Bertha Stakes, MD  Multiple Vitamin (MULTIVITAMIN) tablet Take 1 tablet by mouth daily.    Historical Provider, MD  NIFEdipine (ADALAT CC) 90 MG 24 hr tablet Take 1 tablet (90 mg total) by mouth daily. 09/06/14   Bertha Stakes, MD  omeprazole (PRILOSEC) 20 MG capsule Take 1 capsule (20 mg total) by mouth 2 (two) times daily. 05/06/15   Oval Linsey, MD  ondansetron (ZOFRAN ODT) 4 MG disintegrating tablet Take 1 tablet (4 mg total) by mouth every 8 (eight) hours as needed for nausea. 05/25/15   Tanna Furry, MD  potassium chloride SA (K-DUR,KLOR-CON) 20 MEQ tablet Take 20 mEq by mouth 2 (two) times daily.    Historical Provider, MD  ranitidine (ZANTAC) 150 MG tablet Take 1 tablet (150 mg total) by mouth at bedtime. 03/05/15   Bertha Stakes, MD  Red Yeast Rice Extract (RED YEAST RICE PO) Take by mouth 2 (two) times daily.    Historical Provider, MD  sertraline (ZOLOFT) 100 MG tablet Take 1 tablet (100 mg total) by mouth 2 (two) times daily. 09/06/14   Bertha Stakes, MD  tiotropium (SPIRIVA HANDIHALER) 18 MCG inhalation capsule Place 1 capsule (18 mcg total) into inhaler and inhale daily. 10/14/14   Elsie Stain, MD  topiramate (TOPAMAX) 50 MG tablet Take 1 tablet (50 mg total) by mouth 2 (two) times daily. 04/02/15   Bertha Stakes, MD  triamcinolone cream  (KENALOG) 0.1 % APPLY TOPICALLY TO THE AFFECTED AREA OF HAND TWICE DAILY AS NEEDED Patient not taking: Reported on 05/25/2015 03/07/14   Bertha Stakes, MD   BP 137/79 mmHg  Pulse 132  Temp(Src) 100.8 F (38.2 C) (Oral)  Resp 25  Wt 221 lb (100.245 kg)  SpO2 97% Physical Exam  Constitutional: She is oriented to person, place, and time. She appears well-developed and well-nourished. No distress.  HENT:  Head: Normocephalic and atraumatic.  Eyes: Conjunctivae and EOM are normal.  Cardiovascular: An irregularly irregular rhythm present. Tachycardia present.   Pulmonary/Chest: Effort normal and breath sounds normal. No stridor. No respiratory distress.  Abdominal: She exhibits no distension. There is generalized tenderness. There is guarding. There is no rigidity.    Musculoskeletal: She exhibits no edema.  Neurological: She is alert and oriented to person, place, and time. No cranial nerve deficit.  Skin: Skin is warm and dry.  Psychiatric: She has a normal mood and affect.  Nursing note and vitals reviewed.   ED Course  CARDIOVERSION Date/Time: 05/27/2015 9:08 PM Performed by: Carmin Muskrat Authorized by: Carmin Muskrat Consent: The procedure was performed in an emergent situation. Verbal consent obtained. Risks and benefits: risks, benefits and alternatives were discussed Consent given by: patient Patient understanding: patient states understanding of the procedure being performed Patient consent: the patient's understanding of the procedure matches consent given Procedure consent: procedure consent matches procedure scheduled Relevant documents: relevant documents present and verified Test results: test results available and properly labeled Site marked: the operative site was marked Imaging studies: imaging studies available Required items: required blood products, implants, devices, and special equipment available Patient identity confirmed: verbally with patient Time  out: Immediately prior to procedure a "time out" was called to verify the correct patient, procedure, equipment, support staff and site/side marked as required. Patient sedated: no Cardioversion basis: emergent Pre-procedure rhythm: supraventricular tachycardia Patient position: patient was placed in a supine position Chest area: chest area exposed  Electrodes: pads Electrodes placed: anterior-posterior Number of attempts: 2 (adenosine 6mg , w transient improvement, followed by adenosine 12mg , w return to sinus tachycardia) Post-procedure rhythm: sinus tach - afib. Patient tolerance: Patient tolerated the procedure well with no immediate complications   (including critical care time) Labs Review Labs Reviewed  CBC WITH DIFFERENTIAL/PLATELET - Abnormal; Notable for the following:    WBC 15.5 (*)    RBC 3.46 (*)    Hemoglobin 11.3 (*)    HCT 33.4 (*)    Neutrophils Relative % 79 (*)    Neutro Abs 12.1 (*)    Lymphocytes Relative 10 (*)    Monocytes Absolute 1.7 (*)    All other components within normal limits  COMPREHENSIVE METABOLIC PANEL - Abnormal; Notable for the following:    Sodium 132 (*)    Potassium 2.8 (*)    Chloride 97 (*)    Glucose, Bld 119 (*)    Total Protein 6.4 (*)    Albumin 2.5 (*)    AST 143 (*)    ALT 523 (*)    Alkaline Phosphatase 249 (*)    Total Bilirubin 3.1 (*)    All other components within normal limits  LIPASE, BLOOD - Abnormal; Notable for the following:    Lipase 11 (*)    All other components within normal limits  URINALYSIS, ROUTINE W REFLEX MICROSCOPIC (NOT AT [facility]) - Abnormal; Notable for the following:    Color, Urine AMBER (*)    APPearance HAZY (*)    Bilirubin Urine MODERATE (*)    Leukocytes, UA SMALL (*)    All other components within normal limits  URINE MICROSCOPIC-ADD ON - Abnormal; Notable for the following:    Squamous Epithelial / LPF MANY (*)    Casts GRANULAR CAST (*)    All other components within normal limits   CULTURE, BLOOD (ROUTINE X 2)  CULTURE, BLOOD (ROUTINE X 2)  I-STAT CG4 LACTIC ACID, ED  I-STAT CG4 LACTIC ACID, ED    Imaging Review Ct Abdomen Pelvis W Contrast  05/27/2015   CLINICAL DATA:  Status post percutaneous cholecystostomy.  EXAM: CT ABDOMEN AND PELVIS WITH CONTRAST  TECHNIQUE: Multidetector CT imaging of the abdomen and pelvis was performed using the standard protocol following bolus administration of intravenous contrast.  CONTRAST:  100 cc of Omnipaque 300  COMPARISON:  04/25/2015  FINDINGS: Lower chest: Dependent changes are noted within the posterior right lung base.  Hepatobiliary: A percutaneous cholecystostomy tube is identified and appears well suture rated within the gallbladder. The gallbladder appears decompressed. Small fluid collection ventral to the right hepatic lobe measures 2.3 x 3.0 x 4.5 cm, image 37/ series 2. This is mixed attenuation may represent a small biloma and/or hematoma. There is no significant biliary dilatation. 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 The hepatic veins appear patent.  Pancreas: Fatty replacement of the pancreas.  Spleen: Normal appearance of the spleen.  Adrenals/Urinary Tract: Left adrenal gland hypertrophy noted. Right adrenal gland appears normal. Unremarkable appearance of the kidneys. Urinary bladder appears normal.  Stomach/Bowel: The stomach is within normal limits. The small bowel loops have a normal course and caliber. No obstruction. Normal appearance of the colon.  Vascular/Lymphatic: Calcified atherosclerotic disease involves the abdominal aorta. There is an infrarenal abdominal aortic aneurysm which measures 3.7 cm, image 50/series 2. No enlarged retroperitoneal or mesenteric adenopathy. No enlarged pelvic or inguinal lymph nodes.  Reproductive: Previous hysterectomy.  No adnexal mass.  Other: None.  Musculoskeletal: Degenerative disc disease  noted within the lower thoracic and lumbar spine.  IMPRESSION: 1. Interval percutaneous cholecystostomy with decompression of the gallbladder. There is no evidence for biliary obstruction. 2. Small fluid collection ventral to the right lobe of liver is identified. This is mixed attenuation and may represent a small biloma and/or small hematoma. No large hematoma is identified at this time. 3. 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 4. Aortic atherosclerosis 5. Infrarenal abdominal aortic aneurysm. Recommend followup by ultrasound in 2 years. This recommendation follows ACR consensus guidelines: White Paper of the ACR Incidental Findings Committee II on Vascular Findings. J Am Coll Radiol 2013; 10:789-794. 6. Critical Value/emergent results were called by telephone at the time of interpretation on 05/27/2015 at 7:44 pm to Dr. Carmin Muskrat , who verbally acknowledged these results.   Electronically Signed   By: Kerby Moors M.D.   On: 05/27/2015 19:44   Ir Sinus/fist Tube Chk-non Gi  05/27/2015   CLINICAL DATA:  Hemobilia, status post cholecystostomy, tachycardia, diaphoresis  EXAM: Fluoroscopic injection of the existing cholecystostomy  Date:  6/21/20166/21/2016 3:38 pm  Radiologist:  M. Daryll Brod, MD  Guidance:  Fluoroscopic  FLUOROSCOPY TIME:  54 seconds, 50 mGy  MEDICATIONS AND MEDICAL HISTORY: None.  ANESTHESIA/SEDATION: None.  CONTRAST:  28mL OMNIPAQUE IOHEXOL 300 MG/ML  SOLN  COMPLICATIONS: None immediate  PROCEDURE: Informed consent was obtained from the patient following explanation of the procedure, risks, benefits and alternatives. The patient understands, agrees and consents for the procedure. All questions were addressed. A time out was performed.  Under sterile conditions, the existing cholecystostomy was injected with contrast under fluoroscopy. Images obtained for documentation.  Cholangiogram: Stable position  of the cholecystostomy tube within the gallbladder. Filling defects throughout the gallbladder could represent blood clots and/or stones. Cystic duct is patent. Contrast does reach the common hepatic duct. No communication to be any adjacent vascular structure to account for the hemobilia.  IMPRESSION: Cholecystostomy in good position.  Patent cystic duct.  Gallbladder diffuse filling defect compatible with blood clot versus cholelithiasis.   Electronically Signed   By: Jerilynn Mages.  Shick M.D.   On: 05/27/2015 16:26   Dg Chest Port 1 View  05/27/2015   CLINICAL DATA:  Abdominal pain and tachycardia.  EXAM: PORTABLE CHEST - 1 VIEW  COMPARISON:  05/25/2015  FINDINGS: Portable view of the chest demonstrates low lung volumes. Difficult to exclude vascular congestion. Heart size is accentuated by the low lung volumes. Negative for pneumothorax.  IMPRESSION: Low lung volumes without focal disease. Difficult to exclude vascular congestion.   Electronically Signed   By: Markus Daft M.D.   On: 05/27/2015 17:54     EKG Interpretation   Date/Time:  Tuesday May 27 2015 15:26:27 EDT Ventricular Rate:  140 PR Interval:  89 QRS Duration: 77 QT Interval:  305 QTC Calculation: 465 R Axis:   -15 Text Interpretation:  Sinus or ectopic atrial tachycardia Atrial premature  complexes Left ventricular hypertrophy Inferior infarct, old Atrial  fibrillation Artifact Abnormal ekg Confirmed by Carmin Muskrat  MD  [phone]) on 05/27/2015 3:52:28 PM      On monitor the patient has her right 130s, though this slows into the 90s, and again increases inconsistently, with an abnormal rhythm, abnormal Pulse ox symmetry 99% room air normal   EKG with heart rate 140, atrial tachycardia,  versus atrial fibrillation, with substantial artifact, abnormal   Chart review notable for admission 9m pta from Sepsis / acute chole requiring drain placement.   IR Study 6/21 FINDINGS: There are filling defects in the gallbladder and cystic  duct compatible with thrombus. The cystic duct is patent.   IMPRESSION: The cystic duct is patent. Blood clots are present in the gallbladder. Recommendations are for drain capping and resuming bag drainage tomorrow.     Electronically Signed   By: Marybelle Killings M.D.   On: 05/27/2015 07:51  I discussed her case w radiology, and subsequent with interventional radiology.   The interventional radiologist was the same practitioner who evaluated the patient 2 days ago. With concern for new hematoma versus biloma, they will evaluate the patient in the morning. Given concern for the new thrombosis, with also discussed initiation of anticoagulate. Given the passage of ongoing blood from her cholecystostomy tube, with no complete obstruction of the hepatic vein, and Tequin dosing will not be initiated.    9:07 PM Eyes: The patient's room due to elevated heart rate. On exam the patient had heart rate in the 230 range. Blood pressure slightly diminished from prior. Patient remained with good mentation. After discussion with patient and her daughter, patient acquiesced for cardioversion with adenosine.  After conversion to sinus tachycardia, versus atrial fibrillation, repeat EKG shows heart rate 119, sinus tachycardia, artifact, no ischemic changes.    MDM   Patient presents with concern of fever, tachycardia, and continued passage of blood into her cholecystostomy tube. Patient is notable history of recent acute cholecystitis, complicated by sepsis Patient was also evaluated here several days ago for similar bloody discharge. Today, with concern for increasing abdominal pain, ongoing bleeding, CT scans performed. This is notable for demonstration of 2 abnormalities, biloma versus hematoma and thrombosis of multiple hepatic veins. Patient was treated for sepsis, soon after arrival with fluids, antibiotics. After return of imaging, and after discussion with both radiology and  interventional radiology, patient was admitted to stepdown unit. Prior to admission to the unit, the patient went into SVT, requiring cardioversion with adenosine. Patient tolerated this well.   CRITICAL CARE Performed by: Carmin Muskrat Total critical care time: 55 Critical care time was exclusive of separately billable procedures and treating other patients. Critical care was necessary to treat or prevent imminent or life-threatening deterioration. Critical care was time spent personally by me on the following activities: development of treatment plan with patient and/or surrogate as well as nursing, discussions with consultants, evaluation of patient's response to treatment, examination of patient, obtaining history from patient or surrogate, ordering and performing treatments and interventions, ordering and review of laboratory studies, ordering and review of radiographic studies, pulse oximetry and re-evaluation of patient's condition.    Carmin Muskrat, MD 05/27/15 2114   9:30 PM Patient back in SVT. Amiodarone ordered, cardiology and PCCM consulted.  After discussing the patient's case with cardiology, and after she has started amiodarone, had potassium repletion, she has now been in A. fib/sinus rhythm for 1.5 hours.  She is thought to be appropriate for stepdown bed.    Carmin Muskrat, MD 05/27/15 2258

## 2015-05-27 NOTE — Consult Note (Signed)
ANTIBIOTIC CONSULT NOTE - INITIAL  Pharmacy Consult for Vancomycin and Zosyn Indication: sepsis  Allergies  Allergen Reactions  . Tetracycline Other (See Comments)    REACTION: stomach upset  . Flonase [Fluticasone] Cough  . Lisinopril Cough    Patient Measurements: Weight: 221 lb (100.245 kg) Height 5'3''  Vital Signs: Temp: 99.6 F (37.6 C) (06/21 1716) Temp Source: Rectal (06/21 1716) BP: 120/81 mmHg (06/21 1700) Pulse Rate: 151 (06/21 1700) Intake/Output from previous day:   Intake/Output from this shift:    Labs:  Recent Labs  05/25/15 1006 05/27/15 1600  WBC 8.3 15.5*  HGB 12.2 11.3*  PLT 210 236  CREATININE 0.58  --    Estimated Creatinine Clearance: 67.5 mL/min (by C-G formula based on Cr of 0.58).  Microbiology: No results found for this or any previous visit (from the past 720 hour(s)).  Medical History: Past Medical History  Diagnosis Date  . COPD (chronic obstructive pulmonary disease)     O2 dependent. Followed by Dr. Joya Gaskins  . Fibromyalgia   . Hypertension   . OSA (obstructive sleep apnea)     Sleep study done April 02, 2008 showed severe obstructive sleep apnea.  . Diastolic dysfunction     Grade I diastolic dysfunction as shown on ECHO Dec 2011.  EF of 65-70%.  Marland Kitchen COPD (chronic obstructive pulmonary disease)     Followed by Dr. Joya Gaskins  . Dyslipidemia   . Osteopenia     DXA scan done on 10/23/2009 showed osteopenia of AP spine (young adult T-score -1.3), osteopenia of left femur neck (young adult T-score -1.8), and normal bone density of right femur neck (young adult T-score -0.8), with a FRAX estimate of the 10-year probability of a major osteoporotic fracture of 9.7% and probability of hip fracture 1.6%.  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 Estanislado Pandy on 11/10/2004.  MRA of the head on 03/09/2012 showed no evidence for recanalization.  . Osteoarthrosis involving  multiple sites   . Chronic back pain   . Depression   . Anxiety   . Anemia   . GERD (gastroesophageal reflux disease)   . Meniere disease     Followed by ENT Dr. Silvestre Moment  . Diverticulosis of colon   . Multiple pigmented nevi   . Hypokalemia   . S/P hysterectomy 1975  . Pneumonia 11/2004  . Knee pain   . Dysphagia   . Urosepsis 11/04/2010    Klebsiella pneumoniae  . Breast pain   . Allergic rhinitis   . Fibromyalgia   . 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     . Urinary incontinence    Assessment: 74yof who was hospitalized about a month ago for acute cholecystitis now s/p cholecystostomy presents to the ED with fever, tachycardia, and diaphoresis. She will begin vancomycin and zosyn for probable sepsis. Renal function wnl.  Goal of Therapy:  Vancomycin trough level 15-20 mcg/ml  Plan:  1) Vancomycin 1250mg  IV q12 2) Zosyn 3.375g IV q8 ( 4 hour infusion) 3) Follow renal function, cultures, LOT, level if needed  Deboraha Sprang 05/27/2015,5:37 PM

## 2015-05-27 NOTE — Progress Notes (Signed)
Report received by this RN from the ED.

## 2015-05-28 DIAGNOSIS — I10 Essential (primary) hypertension: Secondary | ICD-10-CM

## 2015-05-28 DIAGNOSIS — I5030 Unspecified diastolic (congestive) heart failure: Secondary | ICD-10-CM

## 2015-05-28 DIAGNOSIS — J449 Chronic obstructive pulmonary disease, unspecified: Secondary | ICD-10-CM

## 2015-05-28 DIAGNOSIS — I820 Budd-Chiari syndrome: Secondary | ICD-10-CM

## 2015-05-28 DIAGNOSIS — F418 Other specified anxiety disorders: Secondary | ICD-10-CM

## 2015-05-28 DIAGNOSIS — G4733 Obstructive sleep apnea (adult) (pediatric): Secondary | ICD-10-CM

## 2015-05-28 DIAGNOSIS — M797 Fibromyalgia: Secondary | ICD-10-CM

## 2015-05-28 DIAGNOSIS — I4891 Unspecified atrial fibrillation: Secondary | ICD-10-CM

## 2015-05-28 DIAGNOSIS — Z978 Presence of other specified devices: Secondary | ICD-10-CM

## 2015-05-28 DIAGNOSIS — I471 Supraventricular tachycardia: Secondary | ICD-10-CM

## 2015-05-28 DIAGNOSIS — Z7951 Long term (current) use of inhaled steroids: Secondary | ICD-10-CM

## 2015-05-28 DIAGNOSIS — Z9989 Dependence on other enabling machines and devices: Secondary | ICD-10-CM

## 2015-05-28 DIAGNOSIS — Z79899 Other long term (current) drug therapy: Secondary | ICD-10-CM

## 2015-05-28 DIAGNOSIS — Z87891 Personal history of nicotine dependence: Secondary | ICD-10-CM

## 2015-05-28 DIAGNOSIS — Z8719 Personal history of other diseases of the digestive system: Secondary | ICD-10-CM

## 2015-05-28 DIAGNOSIS — I714 Abdominal aortic aneurysm, without rupture: Secondary | ICD-10-CM

## 2015-05-28 DIAGNOSIS — I81 Portal vein thrombosis: Secondary | ICD-10-CM | POA: Diagnosis present

## 2015-05-28 LAB — MAGNESIUM: Magnesium: 1.7 mg/dL (ref 1.7–2.4)

## 2015-05-28 LAB — COMPREHENSIVE METABOLIC PANEL
ALBUMIN: 2.1 g/dL — AB (ref 3.5–5.0)
ALT: 334 U/L — ABNORMAL HIGH (ref 14–54)
ANION GAP: 7 (ref 5–15)
AST: 68 U/L — ABNORMAL HIGH (ref 15–41)
Alkaline Phosphatase: 228 U/L — ABNORMAL HIGH (ref 38–126)
BUN: 6 mg/dL (ref 6–20)
CALCIUM: 8.2 mg/dL — AB (ref 8.9–10.3)
CHLORIDE: 102 mmol/L (ref 101–111)
CO2: 23 mmol/L (ref 22–32)
Creatinine, Ser: 0.59 mg/dL (ref 0.44–1.00)
GFR calc non Af Amer: >60 mL/min (ref >60)
GLUCOSE: 122 mg/dL — AB (ref 65–99)
POTASSIUM: 2.9 mmol/L — AB (ref 3.5–5.1)
Sodium: 132 mmol/L — ABNORMAL LOW (ref 135–145)
Total Bilirubin: 2.4 mg/dL — ABNORMAL HIGH (ref 0.3–1.2)
Total Protein: 5.2 g/dL — ABNORMAL LOW (ref 6.5–8.1)

## 2015-05-28 LAB — BASIC METABOLIC PANEL
ANION GAP: 7 (ref 5–15)
BUN: 8 mg/dL (ref 6–20)
CO2: 23 mmol/L (ref 22–32)
Calcium: 8.3 mg/dL — ABNORMAL LOW (ref 8.9–10.3)
Chloride: 104 mmol/L (ref 101–111)
Creatinine, Ser: 0.55 mg/dL (ref 0.44–1.00)
GFR calc Af Amer: >60 mL/min (ref >60)
GFR calc non Af Amer: >60 mL/min (ref >60)
Glucose, Bld: 134 mg/dL — ABNORMAL HIGH (ref 65–99)
Potassium: 3 mmol/L — ABNORMAL LOW (ref 3.5–5.1)
Sodium: 134 mmol/L — ABNORMAL LOW (ref 135–145)

## 2015-05-28 LAB — CBC
HCT: 31.4 % — ABNORMAL LOW (ref 36.0–46.0)
HEMOGLOBIN: 10.6 g/dL — AB (ref 12.0–15.0)
MCH: 32.3 pg (ref 26.0–34.0)
MCHC: 33.8 g/dL (ref 30.0–36.0)
MCV: 95.7 fL (ref 78.0–100.0)
PLATELETS: 207 10*3/uL (ref 150–400)
RBC: 3.28 MIL/uL — ABNORMAL LOW (ref 3.87–5.11)
RDW: 13.9 % (ref 11.5–15.5)
WBC: 14.6 10*3/uL — ABNORMAL HIGH (ref 4.0–10.5)

## 2015-05-28 LAB — MRSA PCR SCREENING: MRSA by PCR: POSITIVE — AB

## 2015-05-28 MED ORDER — AMIODARONE HCL IN DEXTROSE 360-4.14 MG/200ML-% IV SOLN
30.0000 mg/h | INTRAVENOUS | Status: DC
  Administered 2015-05-28 (×2): 30 mg/h via INTRAVENOUS
  Filled 2015-05-28 (×5): qty 200

## 2015-05-28 MED ORDER — DIGOXIN 0.25 MG/ML IJ SOLN
0.5000 mg | INTRAMUSCULAR | Status: AC
  Administered 2015-05-28: 0.5 mg via INTRAVENOUS
  Filled 2015-05-28: qty 2

## 2015-05-28 MED ORDER — CETYLPYRIDINIUM CHLORIDE 0.05 % MT LIQD
7.0000 mL | Freq: Two times a day (BID) | OROMUCOSAL | Status: DC
  Administered 2015-05-28 (×2): 7 mL via OROMUCOSAL

## 2015-05-28 MED ORDER — CHLORHEXIDINE GLUCONATE 0.12 % MT SOLN
15.0000 mL | Freq: Two times a day (BID) | OROMUCOSAL | Status: DC
  Administered 2015-05-28 (×2): 15 mL via OROMUCOSAL
  Filled 2015-05-28 (×5): qty 15

## 2015-05-28 MED ORDER — ENOXAPARIN SODIUM 100 MG/ML ~~LOC~~ SOLN
100.0000 mg | Freq: Once | SUBCUTANEOUS | Status: AC
  Administered 2015-05-28: 100 mg via SUBCUTANEOUS
  Filled 2015-05-28: qty 1

## 2015-05-28 MED ORDER — POTASSIUM CHLORIDE CRYS ER 20 MEQ PO TBCR
40.0000 meq | EXTENDED_RELEASE_TABLET | Freq: Two times a day (BID) | ORAL | Status: DC
  Administered 2015-05-28 – 2015-05-30 (×4): 40 meq via ORAL
  Filled 2015-05-28 (×5): qty 2

## 2015-05-28 MED ORDER — ENOXAPARIN SODIUM 100 MG/ML ~~LOC~~ SOLN
100.0000 mg | Freq: Two times a day (BID) | SUBCUTANEOUS | Status: DC
  Administered 2015-05-29: 100 mg via SUBCUTANEOUS
  Filled 2015-05-28 (×3): qty 1

## 2015-05-28 MED ORDER — AMIODARONE HCL IN DEXTROSE 360-4.14 MG/200ML-% IV SOLN
60.0000 mg/h | INTRAVENOUS | Status: AC
  Administered 2015-05-28 (×2): 60 mg/h via INTRAVENOUS
  Filled 2015-05-28: qty 200

## 2015-05-28 MED ORDER — CHLORHEXIDINE GLUCONATE CLOTH 2 % EX PADS
6.0000 | MEDICATED_PAD | Freq: Every day | CUTANEOUS | Status: DC
  Administered 2015-05-28 – 2015-05-30 (×3): 6 via TOPICAL

## 2015-05-28 MED ORDER — MUPIROCIN 2 % EX OINT
1.0000 "application " | TOPICAL_OINTMENT | Freq: Two times a day (BID) | CUTANEOUS | Status: DC
  Administered 2015-05-28 – 2015-05-30 (×5): 1 via NASAL
  Filled 2015-05-28 (×3): qty 22

## 2015-05-28 MED ORDER — MAGNESIUM SULFATE 2 GM/50ML IV SOLN
2.0000 g | Freq: Once | INTRAVENOUS | Status: AC
  Administered 2015-05-28: 2 g via INTRAVENOUS
  Filled 2015-05-28: qty 50

## 2015-05-28 NOTE — Progress Notes (Signed)
Subjective:    No acute events overnight. Patient denies any chest pain, shortness of breath, or nausea/vomiting. She reports stable diffuse abdominal pain. She has converted to normal sinus rhythm after episode of Afib with RVR last night. Remains on the amiodarone drip.    Objective:    Vital Signs:   Temp:  [97.6 F (36.4 C)-100.8 F (38.2 C)] 97.6 F (36.4 C) (06/22 0700) Pulse Rate:  [92-231] 133 (06/22 0358) Resp:  [17-28] 20 (06/22 0358) BP: (92-150)/(47-99) 137/79 mmHg (06/22 0358) SpO2:  [92 %-100 %] 96 % (06/22 1130) Weight:  [100.245 kg (221 lb)-103.5 kg (228 lb 2.8 oz)] 103.5 kg (228 lb 2.8 oz) (06/21 2338)    Intake/Output:   Intake/Output Summary (Last 24 hours) at 05/28/15 1254 Last data filed at 05/28/15 0700  Gross per 24 hour  Intake 772.67 ml  Output    460 ml  Net 312.67 ml      Physical Exam: Gen: Lying in bed in no acute distress, appears comfortable. Heart: Regular rate and rhythm, no murmurs/rubs/gallops Lungs: CTAB in anterior fields, no wheeze or crackles. Normal work of breathing on room air. Abdomen: Soft, mildly distended. Diffuse tenderness to palpation. Active bowel sounds. Cholecystostomy drain in place with clean/dry/intact dressing. Chole drain site with no erythema or warmth, small amount of dried blood.  Extremities: Warm, well perfused, no pitting edema. SCDs in place.  Neuro: Alert, appropriate, no gross focal deficits  Labs:  Basic Metabolic Panel:  Recent Labs Lab 05/25/15 1006 05/27/15 1600 05/28/15 0235  NA 136 132* 132*  K 3.4* 2.8* 2.9*  CL 107 97* 102  CO2 22 23 23   GLUCOSE 202* 119* 122*  BUN 10 13 6   CREATININE 0.58 0.68 0.59  CALCIUM 9.3 8.9 8.2*  MG  --  1.8  --     Liver Function Tests:  Recent Labs Lab 05/25/15 1006 05/27/15 1600 05/28/15 0235  AST 1065* 143* 68*  ALT 493* 523* 334*  ALKPHOS 240* 249* 228*  BILITOT 1.6* 3.1* 2.4*  PROT 7.0 6.4* 5.2*  ALBUMIN 3.0* 2.5* 2.1*    Recent  Labs Lab 05/25/15 1006 05/27/15 1600  LIPASE 14* 11*   CBC:  Recent Labs Lab 05/25/15 1006 05/27/15 1600 05/28/15 0235  WBC 8.3 15.5* 14.6*  NEUTROABS 7.2 12.1*  --   HGB 12.2 11.3* 10.6*  HCT 36.7 33.4* 31.4*  MCV 98.4 96.5 95.7  PLT 210 236 207    Cardiac Enzymes:  Recent Labs Lab 05/25/15 1006  TROPONINI <0.03     Microbiology: Results for orders placed or performed during the hospital encounter of 05/27/15  MRSA PCR Screening     Status: Abnormal   Collection Time: 05/27/15 12:59 AM  Result Value Ref Range Status   MRSA by PCR POSITIVE (A) NEGATIVE Final    Comment:        The GeneXpert MRSA Assay (FDA approved for NASAL specimens only), is one component of a comprehensive MRSA colonization surveillance program. It is not intended to diagnose MRSA infection nor to guide or monitor treatment for MRSA infections. RESULT CALLED TO, READ BACK BY AND VERIFIED WITH: W.WORK RN [phone] 05/28/15 E.GADDY      Other results: EKG 05/28/15: Normal sinus rhythm with premature atrial complexes  Imaging: Ct Abdomen Pelvis W Contrast  05/27/2015   CLINICAL DATA:  Status post percutaneous cholecystostomy.  EXAM: CT ABDOMEN AND PELVIS WITH CONTRAST  TECHNIQUE: Multidetector CT imaging of the abdomen and pelvis was performed using the  standard protocol following bolus administration of intravenous contrast.  CONTRAST:  100 cc of Omnipaque 300  COMPARISON:  04/25/2015  FINDINGS: Lower chest: Dependent changes are noted within the posterior right lung base.  Hepatobiliary: A percutaneous cholecystostomy tube is identified and appears well suture rated within the gallbladder. The gallbladder appears decompressed. Small fluid collection ventral to the right hepatic lobe measures 2.3 x 3.0 x 4.5 cm, image 37/ series 2. This is mixed attenuation may represent a small biloma and/or hematoma. There is no significant biliary dilatation. There appears to be thrombosis of the portal vein  branch to the lateral segment of the left lobe and medial segment of left lobe. The hepatic veins appear patent.  Pancreas: Fatty replacement of the pancreas.  Spleen: Normal appearance of the spleen.  Adrenals/Urinary Tract: Left adrenal gland hypertrophy noted. Right adrenal gland appears normal. Unremarkable appearance of the kidneys. Urinary bladder appears normal.  Stomach/Bowel: The stomach is within normal limits. The small bowel loops have a normal course and caliber. No obstruction. Normal appearance of the colon.  Vascular/Lymphatic: Calcified atherosclerotic disease involves the abdominal aorta. There is an infrarenal abdominal aortic aneurysm which measures 3.7 cm, image 50/series 2. No enlarged retroperitoneal or mesenteric adenopathy. No enlarged pelvic or inguinal lymph nodes.  Reproductive: Previous hysterectomy.  No adnexal mass.  Other: None.  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  IMPRESSION: 1. Interval percutaneous cholecystostomy with decompression of the gallbladder. There is no evidence for biliary obstruction. 2. Small fluid collection ventral to the right lobe of liver is identified. This is mixed attenuation and may represent a small biloma and/or small hematoma. No large hematoma is identified at this time. 3. 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 4. Aortic atherosclerosis 5. Infrarenal abdominal aortic aneurysm. Recommend followup by ultrasound in 2 years. This recommendation follows ACR consensus guidelines: White Paper of the ACR Incidental Findings Committee II on Vascular Findings. J Am Coll Radiol 2013; 10:789-794. 6. Critical Value/emergent results were called by telephone at the time of interpretation on 05/27/2015 at 7:44 pm to Dr. Carmin Muskrat , who verbally acknowledged these results.   Electronically Signed   By: Kerby Moors M.D.    On: 05/27/2015 19:44   Ir Sinus/fist Tube Chk-non Gi  05/27/2015   CLINICAL DATA:  Hemobilia, status post cholecystostomy, tachycardia, diaphoresis  EXAM: Fluoroscopic injection of the existing cholecystostomy  Date:  6/21/20166/21/2016 3:38 pm  Radiologist:  M. Daryll Brod, MD  Guidance:  Fluoroscopic  FLUOROSCOPY TIME:  54 seconds, 50 mGy  MEDICATIONS AND MEDICAL HISTORY: None.  ANESTHESIA/SEDATION: None.  CONTRAST:  64mL OMNIPAQUE IOHEXOL 300 MG/ML  SOLN  COMPLICATIONS: None immediate  PROCEDURE: Informed consent was obtained from the patient following explanation of the procedure, risks, benefits and alternatives. The patient understands, agrees and consents for the procedure. All questions were addressed. A time out was performed.  Under sterile conditions, the existing cholecystostomy was injected with contrast under fluoroscopy. Images obtained for documentation.  Cholangiogram: Stable position of the cholecystostomy tube within the gallbladder. Filling defects throughout the gallbladder could represent blood clots and/or stones. Cystic duct is patent. Contrast does reach the common hepatic duct. No communication to be any adjacent vascular structure to account for the hemobilia.  IMPRESSION: Cholecystostomy in good position.  Patent cystic duct.  Gallbladder diffuse filling defect compatible with blood clot versus cholelithiasis.   Electronically  Signed   By: Jerilynn Mages.  Shick M.D.   On: 05/27/2015 16:26   Dg Chest Port 1 View  05/27/2015   CLINICAL DATA:  Abdominal pain and tachycardia.  EXAM: PORTABLE CHEST - 1 VIEW  COMPARISON:  05/25/2015  FINDINGS: Portable view of the chest demonstrates low lung volumes. Difficult to exclude vascular congestion. Heart size is accentuated by the low lung volumes. Negative for pneumothorax.  IMPRESSION: Low lung volumes without focal disease. Difficult to exclude vascular congestion.   Electronically Signed   By: Markus Daft M.D.   On: 05/27/2015 17:54        Medications:    Infusions: . amiodarone 30 mg/hr (05/28/15 0636)    Scheduled Medications: . antiseptic oral rinse  7 mL Mouth Rinse q12n4p  . chlorhexidine  15 mL Mouth Rinse BID  . Chlorhexidine Gluconate Cloth  6 each Topical Q0600  . mometasone-formoterol  2 puff Inhalation BID  . mupirocin ointment  1 application Nasal BID  . piperacillin-tazobactam (ZOSYN)  IV  3.375 g Intravenous 3 times per day  . sertraline  100 mg Oral BID  . tiotropium  18 mcg Inhalation Daily  . topiramate  50 mg Oral BID  . vancomycin  1,250 mg Intravenous Q12H    PRN Medications: albuterol, ALPRAZolam, HYDROcodone-acetaminophen   Assessment/ Plan:    Patient is a 74 year old woman with history of HTN, paroxysmal atrial fibrillation, COPD and acute cholecystitis in May 2016 who presented with abdominal pain with bloody drainage from cholecystotomy tube, found to have acute portal vein thrombosis.  Active Problems:   Essential hypertension   SVT (supraventricular tachycardia)   Atrial fibrillation with RVR   Portal vein thrombosis   Acute portal vein thrombosis: CT abd/pelvis on admission revealed acute thrombosis of the portal vein branches to the lateral and medial segment of the left lobe. Abdominal pain and elevated liver enzymes and bilirubin likely secondary to portal vein thrombosis. Patient is s/p cholecystostomy placement by IR on 04/26/15 and reported bloody drainage from chole tube (see below). Portal vein thrombosis likely secondary to recent procedure. No history of thromboembolic complications, although patient does have history of paroxysmal  Afib.  -Appreciate IR recommendations and management of cholecystostomy complications -Gastroenterology consulted for further workup of portal vein thrombosis -Start therapeutic Lovenox, pharmacy to dose. Plan to anticoagulate for 3-6 months or longer if needed for Afib -Norco 5-325 2 tabs q6h prn pain -Daily CMP, CBC, and INR  S/p acute  cholecystitis, possible bloody drainage from cholecystotomy tube: Patient reported 5 days of bloody drainage from cholecystotomy tube. Currently only bilious drainage present. Patient has acute anemia with Hgb drop from 12.2 to 10.4 in past 3 days. Fluoroscopic injection of cholecystostomy on 05/25/15 revealed stable position of chole tube and blood clots in gallbladder. CT abd/pelvis with no evidence of intra-abdominal bleed.  -Monitor daily CBC and INR -Appreciate IR recommendations and management of cholecystostomy complications -Pt may need imaging of gallbladder drain tract if bleeding recurs  Atrial fibrillation with RVR: Patient maintaining normal sinus rhythm on amiodarone drip. History of paroxysmal Afib, not on anticoagulation. Pt had two episodes of SVT versus Afib with RVR on presentation, resolved with adenosine. Acute illness and hypokalemia are likely provoking factors. CHADS2-VASC score at least 7, not on anticoagulation. -Cardiology consulted, appreciate recommendations -Continue amiodraone gtt -Treat portal vein thrombosis as above -Replete potassium as below -Telemetry monitoring -Pt may benefit from long-term anticoagulation if low concern for recurrent bleeding  Sepsis: Presented with 3/4 SIRS  criteria (HR 154, RR 25, WBC 15.5) with no signs of infection on CXR. SIRS signs may be secondary to acute portal vein thrombosis rather than infection. No infectious source identified on CXR, CT abd/pelvis, or urinalysis. Patient now with normal heart rate and respiratory rate, and WBC slightly decreased.  -Continue vanc and Zosyn per pharmacy (day 1 of coverage) -Follow up blood cultures from 05/27/15 -Daily CBC  HTN: Soft BPs on admission, now normotensive. - Hold home HCTZ 12.5 mg daily and nifedipine 90 mg daily  Hypokalemia: Potassium 2.8 on admission, asymptomatic. Patient had hypokalemia during last admission.  -Potassium chloride 10 meq every hour for 6 doses -Monitor with  daily CMP  Diastolic heart failure: Last TTE 04/26/2015 w/ EF of 55-60% and grade 1 diastolic dysfunction  COPD: At home, patient on Advair 1 puff bid, Spiriva 18 mch inh daily, and albuterol 1-2 puff q6hprn - Continue on Dulera1 puff bid, Spiriva 18 mch inh daily, and albuterol 1-2 puff q6h prn wheezing or dyspnea  Fibromyalgia- At home patient on norco 7.5-325mg  QID prn, amitriptyline 50mg  qhs, carisoprodol 350mg  qd prn -Continue Norco 5-325 2 tabs q6h prn pain -Hold home amitriptyline and carisoprodol   OSA: No active issues - Continue CPAP nightly  Anxiety and depression: No active issues. - Continue home zoloft 100mg  BID and xanax 0.5mg  BID oprn  Migraines: No active issues. - Continue home Topamax 50 mg BID  FEN - Diet: Heart-healthy - Electrolytes: Monitor and replete PRN. Replete potassium as above.  - IVFs: None  DVT ppx: Therapeutic Lovenox for portal vein thrombosis  CODE-- FULL  Dispo: Disposition is deferred at this time, awaiting improvement of current medical problems.   The patient does have a current PCP Bertha Stakes, MD) and does need an [facility] hospital follow-up appointment after discharge.  The patient does not have transportation limitations that hinder transportation to clinic appointments.  SERVICE NEEDED AT [place]         Y = Yes, Blank = No PT:   OT:   RN:   Equipment:   Other:      Length of Stay: 1 day(s)   Signed: Betsey Amen, MS4

## 2015-05-28 NOTE — Progress Notes (Signed)
ANTICOAGULATION CONSULT NOTE - Initial Consult  Pharmacy Consult:  Lovenox Indication:  Portal vein thrombosis  Allergies  Allergen Reactions  . Tetracycline Other (See Comments)    REACTION: stomach upset  . Flonase [Fluticasone] Cough  . Lisinopril Cough    Patient Measurements: Height: 5\' 3"  (160 cm) Weight: 228 lb 2.8 oz (103.5 kg) IBW/kg (Calculated) : 52.4  Vital Signs: Temp: 97.4 F (36.3 C) (06/22 1200) Temp Source: Oral (06/22 1200) BP: 137/79 mmHg (06/22 0358) Pulse Rate: 133 (06/22 0358)  Labs:  Recent Labs  05/27/15 1600 05/28/15 0235  HGB 11.3* 10.6*  HCT 33.4* 31.4*  PLT 236 207  CREATININE 0.68 0.59    Estimated Creatinine Clearance: 68.8 mL/min (by C-G formula based on Cr of 0.59).   Medical History: Past Medical History  Diagnosis Date  . COPD (chronic obstructive pulmonary disease)     O2 dependent. Followed by Dr. Joya Gaskins  . Fibromyalgia   . Hypertension   . OSA (obstructive sleep apnea)     Sleep study done April 02, 2008 showed severe obstructive sleep apnea.  . Diastolic dysfunction     Grade I diastolic dysfunction as shown on ECHO Dec 2011.  EF of 65-70%.  Marland Kitchen COPD (chronic obstructive pulmonary disease)     Followed by Dr. Joya Gaskins  . Dyslipidemia   . Osteopenia     DXA scan done on 10/23/2009 showed osteopenia of AP spine (young adult T-score -1.3), osteopenia of left femur neck (young adult T-score -1.8), and normal bone density of right femur neck (young adult T-score -0.8), with a FRAX estimate of the 10-year probability of a major osteoporotic fracture of 9.7% and probability of hip fracture 1.6%.  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 Estanislado Pandy on 11/10/2004.  MRA of the head on 03/09/2012 showed no evidence for recanalization.  . Osteoarthrosis involving multiple sites   . Chronic back pain   . Depression   . Anxiety   . Anemia   . GERD  (gastroesophageal reflux disease)   . Meniere disease     Followed by ENT Dr. Silvestre Moment  . Diverticulosis of colon   . Multiple pigmented nevi   . Hypokalemia   . S/P hysterectomy 1975  . Pneumonia 11/2004  . Knee pain   . Dysphagia   . Urosepsis 11/04/2010    Klebsiella pneumoniae  . Breast pain   . Allergic rhinitis   . Fibromyalgia   . 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     . Urinary incontinence       Assessment: 40 YOF known to pharmacy from antibiotic dosing for rule out sepsis.  Patient's CT showed portal vein thrombosis but anticoagulation was held due to bleeding from cholecystostomy tube site.  Now with new Afib with RVR and no contraindication to starting anticoagulation per documentation.  Pharmacy consulted to start Lovenox with plan to transition to a [facility].  Patient's renal function has been stable.   Goal of Therapy:  Anti-Xa level 0.6-1 units/ml 4hrs after LMWH dose given Monitor platelets by anticoagulation protocol: Yes  Vanc trough 15-20 mcg/mL    Plan:  - Lovenox 100mg  SQ Q12H - CBC Q72H while on Lovenox - Monitor closely for s/sx of  bleeding - Vanc 1250mg  IV Q12H (should be 750mg  q12h per nomogram) >> VT tomorrow at 0530 - Zosyn 3.375g Q8H, 4 hr infusion - Monitor renal fxn, clinical progress, vanc trough in AM    Hale Chalfin D. Mina Marble, PharmD, BCPS Pager:  [phone] 05/28/2015, 1:53 PM

## 2015-05-28 NOTE — Progress Notes (Signed)
Subjective: Continues to have abdominal pain RMQ and LLQ. Nausea is better. No SOB or chills. No other complaints currently. No longer having any bleeding at the cholecystostomy bag. In NSR currently on amio drip. Vitals normal.   Objective: Vital signs in last 24 hours: Filed Vitals:   05/28/15 0140 05/28/15 0353 05/28/15 0358 05/28/15 0700  BP: 141/82  137/79   Pulse: 130  133   Temp:  98.3 F (36.8 C)  97.6 F (36.4 C)  TempSrc:  Axillary  Oral  Resp: 21  20   Height:      Weight:      SpO2: 96%  98%    Weight change:   Intake/Output Summary (Last 24 hours) at 05/28/15 1033 Last data filed at 05/28/15 0700  Gross per 24 hour  Intake 772.67 ml  Output    460 ml  Net 312.67 ml   Vitals reviewed. General: resting in bed, NAD HEENT: PERRL, EOMI, no scleral icterus Cardiac: RRR, no rubs, murmurs or gallops Pulm: clear to auscultation bilaterally on anterior field, no wheezes, rales, or rhonchi Abd: soft, cholecystostomy tube site has some dry blood but no active bleeding. Has tenderness around this area. Also has tenderness diffusely on other areas of the abdomen Ext: warm and well perfused, no pedal edema Neuro: alert and oriented X3, cranial nerves II-XII grossly intact, strength and sensation to light touch equal in bilateral upper and lower extremities  Lab Results: Basic Metabolic Panel:  Recent Labs Lab 05/27/15 1600 05/28/15 0235  NA 132* 132*  K 2.8* 2.9*  CL 97* 102  CO2 23 23  GLUCOSE 119* 122*  BUN 13 6  CREATININE 0.68 0.59  CALCIUM 8.9 8.2*  MG 1.8  --    Liver Function Tests:  Recent Labs Lab 05/27/15 1600 05/28/15 0235  AST 143* 68*  ALT 523* 334*  ALKPHOS 249* 228*  BILITOT 3.1* 2.4*  PROT 6.4* 5.2*  ALBUMIN 2.5* 2.1*    Recent Labs Lab 05/25/15 1006 05/27/15 1600  LIPASE 14* 11*   No results for input(s): AMMONIA in the last 168 hours. CBC:  Recent Labs Lab 05/25/15 1006 05/27/15 1600 05/28/15 0235  WBC 8.3 15.5*  14.6*  NEUTROABS 7.2 12.1*  --   HGB 12.2 11.3* 10.6*  HCT 36.7 33.4* 31.4*  MCV 98.4 96.5 95.7  PLT 210 236 207   Cardiac Enzymes:  Recent Labs Lab 05/25/15 1006  TROPONINI <0.03  Urine Drug Screen: Drugs of Abuse  No results found for: LABOPIA, COCAINSCRNUR, LABBENZ, AMPHETMU, THCU, LABBARB  Alcohol Level: No results for input(s): ETH in the last 168 hours. Urinalysis:  Recent Labs Lab 05/27/15 1643  COLORURINE AMBER*  LABSPEC 1.016  PHURINE 5.5  GLUCOSEU NEGATIVE  HGBUR NEGATIVE  BILIRUBINUR MODERATE*  KETONESUR NEGATIVE  PROTEINUR NEGATIVE  UROBILINOGEN 0.2  NITRITE NEGATIVE  LEUKOCYTESUR SMALL*   Misc. Labs: Micro Results: Recent Results (from the past 240 hour(s))  MRSA PCR Screening     Status: Abnormal   Collection Time: 05/27/15 12:59 AM  Result Value Ref Range Status   MRSA by PCR POSITIVE (A) NEGATIVE Final    Comment:        The GeneXpert MRSA Assay (FDA approved for NASAL specimens only), is one component of a comprehensive MRSA colonization surveillance program. It is not intended to diagnose MRSA infection nor to guide or monitor treatment for MRSA infections. RESULT CALLED TO, READ BACK BY AND VERIFIED WITH: W.WORK RN [phone] 05/28/15 E.GADDY  Studies/Results: Ct Abdomen Pelvis W Contrast  05/27/2015   CLINICAL DATA:  Status post percutaneous cholecystostomy.  EXAM: CT ABDOMEN AND PELVIS WITH CONTRAST  TECHNIQUE: Multidetector CT imaging of the abdomen and pelvis was performed using the standard protocol following bolus administration of intravenous contrast.  CONTRAST:  100 cc of Omnipaque 300  COMPARISON:  04/25/2015  FINDINGS: Lower chest: Dependent changes are noted within the posterior right lung base.  Hepatobiliary: A percutaneous cholecystostomy tube is identified and appears well suture rated within the gallbladder. The gallbladder appears decompressed. Small fluid collection ventral to the right hepatic lobe measures 2.3 x 3.0 x 4.5  cm, image 37/ series 2. This is mixed attenuation may represent a small biloma and/or hematoma. There is no significant biliary dilatation. 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 The hepatic veins appear patent.  Pancreas: Fatty replacement of the pancreas.  Spleen: Normal appearance of the spleen.  Adrenals/Urinary Tract: Left adrenal gland hypertrophy noted. Right adrenal gland appears normal. Unremarkable appearance of the kidneys. Urinary bladder appears normal.  Stomach/Bowel: The stomach is within normal limits. The small bowel loops have a normal course and caliber. No obstruction. Normal appearance of the colon.  Vascular/Lymphatic: Calcified atherosclerotic disease involves the abdominal aorta. There is an infrarenal abdominal aortic aneurysm which measures 3.7 cm, image 50/series 2. No enlarged retroperitoneal or mesenteric adenopathy. No enlarged pelvic or inguinal lymph nodes.  Reproductive: Previous hysterectomy.  No adnexal mass.  Other: None.  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  IMPRESSION: 1. Interval percutaneous cholecystostomy with decompression of the gallbladder. There is no evidence for biliary obstruction. 2. Small fluid collection ventral to the right lobe of liver is identified. This is mixed attenuation and may represent a small biloma and/or small hematoma. No large hematoma is identified at this time. 3. 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 4. Aortic atherosclerosis 5. Infrarenal abdominal aortic aneurysm. Recommend followup by ultrasound in 2 years. This recommendation follows ACR consensus guidelines: White Paper of the ACR Incidental Findings Committee II on Vascular Findings. J Am Coll Radiol 2013; 10:789-794. 6. Critical Value/emergent results were  called by telephone at the time of interpretation on 05/27/2015 at 7:44 pm to Dr. Carmin Muskrat , who verbally acknowledged these results.   Electronically Signed   By: Kerby Moors M.D.   On: 05/27/2015 19:44   Ir Sinus/fist Tube Chk-non Gi  05/27/2015   CLINICAL DATA:  Hemobilia, status post cholecystostomy, tachycardia, diaphoresis  EXAM: Fluoroscopic injection of the existing cholecystostomy  Date:  6/21/20166/21/2016 3:38 pm  Radiologist:  M. Daryll Brod, MD  Guidance:  Fluoroscopic  FLUOROSCOPY TIME:  54 seconds, 50 mGy  MEDICATIONS AND MEDICAL HISTORY: None.  ANESTHESIA/SEDATION: None.  CONTRAST:  63mL OMNIPAQUE IOHEXOL 300 MG/ML  SOLN  COMPLICATIONS: None immediate  PROCEDURE: Informed consent was obtained from the patient following explanation of the procedure, risks, benefits and alternatives. The patient understands, agrees and consents for the procedure. All questions were addressed. A time out was performed.  Under sterile conditions, the existing cholecystostomy was injected with contrast under fluoroscopy. Images obtained for documentation.  Cholangiogram: Stable position of the cholecystostomy tube within the gallbladder. Filling defects throughout the gallbladder could represent blood clots and/or stones. Cystic duct is patent. Contrast does reach the common  hepatic duct. No communication to be any adjacent vascular structure to account for the hemobilia.  IMPRESSION: Cholecystostomy in good position.  Patent cystic duct.  Gallbladder diffuse filling defect compatible with blood clot versus cholelithiasis.   Electronically Signed   By: Jerilynn Mages.  Shick M.D.   On: 05/27/2015 16:26   Dg Chest Port 1 View  05/27/2015   CLINICAL DATA:  Abdominal pain and tachycardia.  EXAM: PORTABLE CHEST - 1 VIEW  COMPARISON:  05/25/2015  FINDINGS: Portable view of the chest demonstrates low lung volumes. Difficult to exclude vascular congestion. Heart size is accentuated by the low lung volumes. Negative for  pneumothorax.  IMPRESSION: Low lung volumes without focal disease. Difficult to exclude vascular congestion.   Electronically Signed   By: Markus Daft M.D.   On: 05/27/2015 17:54   Medications: I have reviewed the patient's current medications. Scheduled Meds: . antiseptic oral rinse  7 mL Mouth Rinse q12n4p  . chlorhexidine  15 mL Mouth Rinse BID  . Chlorhexidine Gluconate Cloth  6 each Topical Q0600  . mometasone-formoterol  2 puff Inhalation BID  . mupirocin ointment  1 application Nasal BID  . piperacillin-tazobactam (ZOSYN)  IV  3.375 g Intravenous 3 times per day  . sertraline  100 mg Oral BID  . tiotropium  18 mcg Inhalation Daily  . topiramate  50 mg Oral BID  . vancomycin  1,250 mg Intravenous Q12H   Continuous Infusions: . sodium chloride 100 mL/hr at 05/28/15 0228  . amiodarone 30 mg/hr (05/28/15 0636)   PRN Meds:.albuterol, ALPRAZolam, HYDROcodone-acetaminophen Assessment/Plan: Active Problems:   Essential hypertension   Hepatic vein thrombosis   SVT (supraventricular tachycardia)   Atrial fibrillation with RVR  Acute/recent Portal vein thrombosis -in the setting of cholecystostomy placement on 04/2015 for acute choelcystitis Presented with abdominal pain, on going bleeding seen at cholecystostomy site for last few days. CT abdomen showing portal vein thrombosis seen  on lateral and medial segment of left lobe. Likely 2/2 to recent procedure of cholecystostomy insertion but could be other causes as well has LFT elevation but down compared to 6/19 , White count (but afebrile), and tachycardia (initially was in SVT then to Afib then now NSR after amio) likely 2/2 to this. - initially did not anticoag with image finding because of bleeding. No active bleeding currenlty - appreciate IR recs: start anticoag as not actively bleeding. If bleeding recurs, may need GB drain tract injection/imaging -GI consulted for recs about why this patient would have portal vein thrombosis. -  trend LFT's as patient is at risk of liver failure.  Sepsis criteria - no clear source of infection. No resp symptoms, no dysuria, does have ab tenderness likely from the portal vein thrombosis.  BP stable. Afebrile now. On vanc+zosyn currently.  Tachycardia and white count can also be 2/2 to Portal vein thrombosis.  - follow up bcx - cont vanc+zosyn for now. If no surce of infection, then dc/ abx.   Afib with RVR likely 2/2 to sepsis or Acute Portal vein thrombosis (was initially in SVT, s/p adenosine), now on amio drip - in NSR now.  - appreciate card recs: cont amio drip for now - needs to be anticoagulated.  - replete K+ and Mag as needed.   Hypokalemia and hypomag- replete as needed  HTN - at home on HCTZ 12.5mg  daily and nifedipine 90mg  daily.  - hold for now, resume home meds if BP goes up.  CHF - last TTE 04/2015 w/ EF 55-60% with  grade 1 diastolic dysfunction - euvolemic now. Monitor.  COPD - o2 depended - on advair, spiriva, and albuterol daily ath ome - cont home meds  OSA - cont cpap qhs  Anxiety and depression - cont home zoloft and xanax  migraine - cont home topamax    Dispo: Disposition is deferred at this time, awaiting improvement of current medical problems.  Anticipated discharge in approximately 1-2 day(s).   The patient does have a current PCP Bertha Stakes, MD) and does need an [facility] hospital follow-up appointment after discharge.  The patient does have transportation limitations that hinder transportation to clinic appointments.  .Services Needed at time of discharge: Y = Yes, Blank = No PT:   OT:   RN:   Equipment:   Other:     LOS: 1 day   Dellia Nims, MD 05/28/2015, 10:33 AM

## 2015-05-28 NOTE — Consult Note (Signed)
Referring Provider: Dr. Ellwood Dense Primary Care Physician:  Axel Filler, MD Primary Gastroenterologist:  Althia Forts  Reason for Consultation:  Portal Vein Thrombosis  HPI: Jennifer [name] is a 74 y.o. female being seen for a consult due to a diagnosis of acute portal vein thrombosis who had a cholecystostomy tub placed in May for acute cholecystitis. She was sent to the hospital by her nursing home yesterday due to bloody drainage in her GB tube and tachycardia. CT showed a PVT and pt placed on Lovenox. Bilious fluid in drain bag. LFTs elevated on admit yesterday with TB 3.1, ALP 249, AST 143, ALT 523 but they are improving today.  Patient on Vanco/Zosyn for possible sepsis. Denies history of cirrhosis or hepatitis. Denies family history of liver disease. Daughter at bedside.    Past Medical History  Diagnosis Date  . COPD (chronic obstructive pulmonary disease)     O2 dependent. Followed by Dr. Joya Gaskins  . Fibromyalgia   . Hypertension   . OSA (obstructive sleep apnea)     Sleep study done April 02, 2008 showed severe obstructive sleep apnea.  . Diastolic dysfunction     Grade I diastolic dysfunction as shown on ECHO Dec 2011.  EF of 65-70%.  Marland Kitchen COPD (chronic obstructive pulmonary disease)     Followed by Dr. Joya Gaskins  . Dyslipidemia   . Osteopenia     DXA scan done on 10/23/2009 showed osteopenia of AP spine (young adult T-score -1.3), osteopenia of left femur neck (young adult T-score -1.8), and normal bone density of right femur neck (young adult T-score -0.8), with a FRAX estimate of the 10-year probability of a major osteoporotic fracture of 9.7% and probability of hip fracture 1.6%.  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 Estanislado Pandy on 11/10/2004.  MRA of the head on 03/09/2012 showed no evidence for recanalization.  . Osteoarthrosis involving multiple sites   . Chronic back pain   . Depression   . Anxiety    . Anemia   . GERD (gastroesophageal reflux disease)   . Meniere disease     Followed by ENT Dr. Silvestre Moment  . Diverticulosis of colon   . Multiple pigmented nevi   . Hypokalemia   . S/P hysterectomy 1975  . Pneumonia 11/2004  . Knee pain   . Dysphagia   . Urosepsis 11/04/2010    Klebsiella pneumoniae  . Breast pain   . Allergic rhinitis   . Fibromyalgia   . 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     . Urinary incontinence     Past Surgical History  Procedure Laterality Date  . Abdominal hysterectomy  1975  . 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 Estanislado Pandy on 11/10/2004.    Prior to Admission medications   Medication Sig Start Date End Date Taking? Authorizing Provider  albuterol (PROAIR HFA) 108 (90 BASE) MCG/ACT inhaler INHALE 2 PUFFS BY MOUTH INTO LUNGS FOUR TIMES DAILY AS NEEDED Patient taking differently: Inhale 1-2 puffs into the lungs every 6 (six) hours as needed for wheezing or shortness of breath.  10/14/14  Yes Elsie Stain, MD  ALPRAZolam Duanne Moron) 0.5 MG tablet Take 1  tablet (0.5 mg total) by mouth 2 (two) times daily as needed for anxiety. 05/02/15  Yes Rushil Sherrye Payor, MD  amitriptyline (ELAVIL) 50 MG tablet Take 1 tablet (50 mg total) by mouth at bedtime. 01/03/15  Yes Bertha Stakes, MD  amoxicillin-clavulanate (AUGMENTIN) 875-125 MG per tablet Take 1 tablet by mouth every 12 (twelve) hours. 05/02/15  Yes Rushil Sherrye Payor, MD  aspirin 81 MG EC tablet Take 81 mg by mouth daily.     Yes Historical Provider, MD  carisoprodol (SOMA) 350 MG tablet Take 1 tablet (350 mg total) by mouth daily as needed for muscle spasms. 01/03/15  Yes Bertha Stakes, MD  Cholecalciferol  (VITAMIN D3) 1000 UNITS CAPS Take 1 capsule (1,000 Units total) by mouth daily. 01/23/15  Yes Bertha Stakes, MD  CINNAMON PO Take 1 tablet by mouth 2 (two) times daily.    Yes Historical Provider, MD  dextromethorphan-guaiFENesin (MUCINEX DM) 30-600 MG per 12 hr tablet Take 1 tablet by mouth 2 (two) times daily.   Yes Historical Provider, MD  docusate sodium (COLACE) 100 MG capsule Take 100 mg by mouth daily as needed for mild constipation or moderate constipation.   Yes Historical Provider, MD  Fluticasone-Salmeterol (ADVAIR DISKUS) 250-50 MCG/DOSE AEPB Inhale 1 puff into the lungs 2 (two) times daily. 10/14/14  Yes Elsie Stain, MD  hydrochlorothiazide (HYDRODIURIL) 25 MG tablet Take 0.5 tablets (12.5 mg total) by mouth daily. 03/07/14  Yes Bertha Stakes, MD  HYDROcodone-acetaminophen (NORCO/VICODIN) 5-325 MG per tablet Take 2 tablets by mouth every 4 (four) hours as needed. Patient taking differently: Take 2 tablets by mouth every 4 (four) hours as needed for moderate pain.  05/25/15  Yes Tanna Furry, MD  hydroxypropyl methylcellulose (ISOPTO TEARS) 2.5 % ophthalmic solution Place 2 drops into both eyes 3 (three) times daily as needed. Dry eye   Yes Historical Provider, MD  ibuprofen (ADVIL,MOTRIN) 200 MG tablet Take 200 mg by mouth every 6 (six) hours as needed for moderate pain. For pain    Yes Historical Provider, MD  meclizine (ANTIVERT) 12.5 MG tablet Take 1 tablet (12.5 mg total) by mouth 2 (two) times daily as needed for dizziness. 11/20/14  Yes Bertha Stakes, MD  Multiple Vitamin (MULTIVITAMIN) tablet Take 1 tablet by mouth daily.   Yes Historical Provider, MD  NIFEdipine (ADALAT CC) 90 MG 24 hr tablet Take 1 tablet (90 mg total) by mouth daily. 09/06/14  Yes Bertha Stakes, MD  omeprazole (PRILOSEC) 20 MG capsule Take 1 capsule (20 mg total) by mouth 2 (two) times daily. 05/06/15  Yes Oval Linsey, MD  ondansetron (ZOFRAN ODT) 4 MG disintegrating tablet Take 1 tablet (4 mg total) by mouth every 8  (eight) hours as needed for nausea. 05/25/15  Yes Tanna Furry, MD  potassium chloride SA (K-DUR,KLOR-CON) 20 MEQ tablet Take 20 mEq by mouth 2 (two) times daily.   Yes Historical Provider, MD  ranitidine (ZANTAC) 150 MG tablet Take 1 tablet (150 mg total) by mouth at bedtime. 03/05/15  Yes Bertha Stakes, MD  Red Yeast Rice Extract (RED YEAST RICE PO) Take 1 tablet by mouth 2 (two) times daily.    Yes Historical Provider, MD  sertraline (ZOLOFT) 100 MG tablet Take 1 tablet (100 mg total) by mouth 2 (two) times daily. 09/06/14  Yes Bertha Stakes, MD  tiotropium (SPIRIVA HANDIHALER) 18 MCG inhalation capsule Place 1 capsule (18 mcg total) into inhaler and inhale daily. 10/14/14  Yes Elsie Stain, MD  topiramate (TOPAMAX) 50 MG  tablet Take 1 tablet (50 mg total) by mouth 2 (two) times daily. 04/02/15  Yes Bertha Stakes, MD  triamcinolone cream (KENALOG) 0.1 % APPLY TOPICALLY TO THE AFFECTED AREA OF HAND TWICE DAILY AS NEEDED Patient not taking: Reported on 05/25/2015 03/07/14   Bertha Stakes, MD    Scheduled Meds: . antiseptic oral rinse  7 mL Mouth Rinse q12n4p  . chlorhexidine  15 mL Mouth Rinse BID  . Chlorhexidine Gluconate Cloth  6 each Topical Q0600  . [START ON 05/29/2015] enoxaparin (LOVENOX) injection  100 mg Subcutaneous Q12H  . magnesium sulfate 1 - 4 g bolus IVPB  2 g Intravenous Once  . mometasone-formoterol  2 puff Inhalation BID  . mupirocin ointment  1 application Nasal BID  . piperacillin-tazobactam (ZOSYN)  IV  3.375 g Intravenous 3 times per day  . sertraline  100 mg Oral BID  . tiotropium  18 mcg Inhalation Daily  . topiramate  50 mg Oral BID  . vancomycin  1,250 mg Intravenous Q12H   Continuous Infusions: . amiodarone 30 mg/hr (05/28/15 0636)   PRN Meds:.albuterol, ALPRAZolam, HYDROcodone-acetaminophen  Allergies as of 05/27/2015 - Review Complete 05/27/2015  Allergen Reaction Noted  . Tetracycline Other (See Comments) 12/29/2006  . Flonase [fluticasone] Cough 08/12/2012  .  Lisinopril Cough 01/06/2011    Family History  Problem Relation Age of Onset  . Ulcers Mother   . Ovarian cancer Sister 50  . Cervical cancer Daughter   . Cervical cancer Daughter   . Lung cancer Neg Hx   . Colon cancer Neg Hx   . Heart attack Father   . Cerebral aneurysm Sister 49    History   Social History  . Marital Status: Widowed    Spouse Name: N/A  . Number of Children: N/A  . Years of Education: N/A   Occupational History  . Not on file.   Social History Main Topics  . Smoking status: Former Smoker -- 0.50 packs/day for 30 years    Types: Cigarettes    Quit date: 12/07/2008  . Smokeless tobacco: Former Systems developer    Types: [place] date: 12/06/1978  . Alcohol Use: No  . Drug Use: No  . Sexual Activity: No   Other Topics Concern  . Not on file   Social History Narrative   Financial assistance approved for 100% discount after Medicare pays for [facility] only, not eligible for [facility] card. Per Bonna Gains    Review of Systems: All negative except as stated above in HPI.  Physical Exam: Vital signs: Filed Vitals:   05/28/15 1200  BP: 137/79  Pulse: 133  Temp: 97.4 F (36.3 C)  Resp: 20     General:   Elderly, Alert,  Well-developed, well-nourished, no acute distress HEENT: anicteric sclera, oropharynx clear Neck: supple, nontender Lungs:  Clear throughout to auscultation.   No wheezes, crackles, or rhonchi. No acute distress. Heart:  Regular rate and rhythm; no murmurs, clicks, rubs,  or gallops. Abdomen: diffusely tender with guarding to light palpation, soft, nondistended, +BS  Rectal:  Deferred Ext: no edema Skin: no rash Psych: normal mood, affect  GI:  Lab Results:  Recent Labs  05/27/15 1600 05/28/15 0235  WBC 15.5* 14.6*  HGB 11.3* 10.6*  HCT 33.4* 31.4*  PLT 236 207   BMET  Recent Labs  05/27/15 1600 05/28/15 0235 05/28/15 1627  NA 132* 132* 134*  K 2.8* 2.9* 3.0*  CL 97* 102 104  CO2 23 23 23  GLUCOSE 119* 122* 134*   BUN 13 6 8   CREATININE 0.68 0.59 0.55  CALCIUM 8.9 8.2* 8.3*   LFT  Recent Labs  05/28/15 0235  PROT 5.2*  ALBUMIN 2.1*  AST 68*  ALT 334*  ALKPHOS 228*  BILITOT 2.4*   PT/INR No results for input(s): LABPROT, INR in the last 72 hours.   Studies/Results: Ct Abdomen Pelvis W Contrast  05/27/2015   CLINICAL DATA:  Status post percutaneous cholecystostomy.  EXAM: CT ABDOMEN AND PELVIS WITH CONTRAST  TECHNIQUE: Multidetector CT imaging of the abdomen and pelvis was performed using the standard protocol following bolus administration of intravenous contrast.  CONTRAST:  100 cc of Omnipaque 300  COMPARISON:  04/25/2015  FINDINGS: Lower chest: Dependent changes are noted within the posterior right lung base.  Hepatobiliary: A percutaneous cholecystostomy tube is identified and appears well suture rated within the gallbladder. The gallbladder appears decompressed. Small fluid collection ventral to the right hepatic lobe measures 2.3 x 3.0 x 4.5 cm, image 37/ series 2. This is mixed attenuation may represent a small biloma and/or hematoma. There is no significant biliary dilatation. 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 The hepatic veins appear patent.  Pancreas: Fatty replacement of the pancreas.  Spleen: Normal appearance of the spleen.  Adrenals/Urinary Tract: Left adrenal gland hypertrophy noted. Right adrenal gland appears normal. Unremarkable appearance of the kidneys. Urinary bladder appears normal.  Stomach/Bowel: The stomach is within normal limits. The small bowel loops have a normal course and caliber. No obstruction. Normal appearance of the colon.  Vascular/Lymphatic: Calcified atherosclerotic disease involves the abdominal aorta. There is an infrarenal abdominal aortic aneurysm which measures 3.7 cm, image 50/series 2. No enlarged retroperitoneal or mesenteric adenopathy. No enlarged pelvic or  inguinal lymph nodes.  Reproductive: Previous hysterectomy.  No adnexal mass.  Other: None.  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  IMPRESSION: 1. Interval percutaneous cholecystostomy with decompression of the gallbladder. There is no evidence for biliary obstruction. 2. Small fluid collection ventral to the right lobe of liver is identified. This is mixed attenuation and may represent a small biloma and/or small hematoma. No large hematoma is identified at this time. 3. 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 4. Aortic atherosclerosis 5. Infrarenal abdominal aortic aneurysm. Recommend followup by ultrasound in 2 years. This recommendation follows ACR consensus guidelines: White Paper of the ACR Incidental Findings Committee II on Vascular Findings. J Am Coll Radiol 2013; 10:789-794. 6. Critical Value/emergent results were called by telephone at the time of interpretation on 05/27/2015 at 7:44 pm to Dr. Carmin Muskrat , who verbally acknowledged these results.   Electronically Signed   By: Kerby Moors M.D.   On: 05/27/2015 19:44   Ir Sinus/fist Tube Chk-non Gi  05/27/2015   CLINICAL DATA:  Hemobilia, status post cholecystostomy, tachycardia, diaphoresis  EXAM: Fluoroscopic injection of the existing cholecystostomy  Date:  6/21/20166/21/2016 3:38 pm  Radiologist:  M. Daryll Brod, MD  Guidance:  Fluoroscopic  FLUOROSCOPY TIME:  54 seconds, 50 mGy  MEDICATIONS AND MEDICAL HISTORY: None.  ANESTHESIA/SEDATION: None.  CONTRAST:  9mL OMNIPAQUE IOHEXOL 300 MG/ML  SOLN  COMPLICATIONS: None immediate  PROCEDURE: Informed consent was obtained from the patient following explanation of the procedure, risks, benefits and alternatives. The patient understands, agrees  and consents for the procedure. All questions were addressed. A time out was performed.  Under sterile conditions, the  existing cholecystostomy was injected with contrast under fluoroscopy. Images obtained for documentation.  Cholangiogram: Stable position of the cholecystostomy tube within the gallbladder. Filling defects throughout the gallbladder could represent blood clots and/or stones. Cystic duct is patent. Contrast does reach the common hepatic duct. No communication to be any adjacent vascular structure to account for the hemobilia.  IMPRESSION: Cholecystostomy in good position.  Patent cystic duct.  Gallbladder diffuse filling defect compatible with blood clot versus cholelithiasis.   Electronically Signed   By: Jerilynn Mages.  Shick M.D.   On: 05/27/2015 16:26   Dg Chest Port 1 View  05/27/2015   CLINICAL DATA:  Abdominal pain and tachycardia.  EXAM: PORTABLE CHEST - 1 VIEW  COMPARISON:  05/25/2015  FINDINGS: Portable view of the chest demonstrates low lung volumes. Difficult to exclude vascular congestion. Heart size is accentuated by the low lung volumes. Negative for pneumothorax.  IMPRESSION: Low lung volumes without focal disease. Difficult to exclude vascular congestion.   Electronically Signed   By: Markus Daft M.D.   On: 05/27/2015 17:54    Impression/Plan: 74 yo with acute portal vein thrombosis of unclear etiology. No evidence of malignancy on recent CT scan. DDx of acute PVT is broad but no evidence of pancreatitis, malignancy, or cirrhosis. Doubt inflammatory bowel disease. I think her PVT is related to her recent GB surgery but may need a Doppler U/S in 2-3 weeks to further evaluate her hepatic and portal flow. May need a hypercoagulable workup as an outpt by hematology. Check AFP. Agree with anticoagulation. Abdominal pain due to PVT. Continue supportive care.    LOS: 1 day   Maries C.  05/28/2015, 6:04 PM  Pager [phone]  If no answer or after 5 PM call [phone]

## 2015-05-28 NOTE — Procedures (Signed)
Pt placed on hospital CPAP machine with her home tubing and mask.  Pt is resting well and is comfortable.  RT will continue to monitor pt.

## 2015-05-28 NOTE — Progress Notes (Signed)
Pt. Is now on CPAP set at 17cm H2O per home settings with her FFM from home. Pt. Is tolerating CPAP well at this time without any complications.

## 2015-05-28 NOTE — Progress Notes (Signed)
UR COMPLETED  

## 2015-05-28 NOTE — Progress Notes (Signed)
SUBJECTIVE:  No complaints of CP or SOB  OBJECTIVE:   Vitals:   Filed Vitals:   05/28/15 0140 05/28/15 0353 05/28/15 0358 05/28/15 0700  BP: 141/82  137/79   Pulse: 130  133   Temp:  98.3 F (36.8 C)  97.6 F (36.4 C)  TempSrc:  Axillary  Oral  Resp: 21  20   Height:      Weight:      SpO2: 96%  98%    I&O's:   Intake/Output Summary (Last 24 hours) at 05/28/15 1018 Last data filed at 05/28/15 0700  Gross per 24 hour  Intake 772.67 ml  Output    460 ml  Net 312.67 ml   TELEMETRY: Reviewed telemetry pt in NSR:     PHYSICAL EXAM General:  WD, WN WF in NAD Heart:  RRR with no M/R/G Lungs:  CTA bilaterally  LABS: Basic Metabolic Panel:  Recent Labs  05/27/15 1600 05/28/15 0235  NA 132* 132*  K 2.8* 2.9*  CL 97* 102  CO2 23 23  GLUCOSE 119* 122*  BUN 13 6  CREATININE 0.68 0.59  CALCIUM 8.9 8.2*  MG 1.8  --    Liver Function Tests:  Recent Labs  05/27/15 1600 05/28/15 0235  AST 143* 68*  ALT 523* 334*  ALKPHOS 249* 228*  BILITOT 3.1* 2.4*  PROT 6.4* 5.2*  ALBUMIN 2.5* 2.1*    Recent Labs  05/27/15 1600  LIPASE 11*   CBC:  Recent Labs  05/27/15 1600 05/28/15 0235  WBC 15.5* 14.6*  NEUTROABS 12.1*  --   HGB 11.3* 10.6*  HCT 33.4* 31.4*  MCV 96.5 95.7  PLT 236 207   Cardiac Enzymes: No results for input(s): CKTOTAL, CKMB, CKMBINDEX, TROPONINI in the last 72 hours. BNP: Invalid input(s): POCBNP D-Dimer: No results for input(s): DDIMER in the last 72 hours. Hemoglobin A1C: No results for input(s): HGBA1C in the last 72 hours. Fasting Lipid Panel: No results for input(s): CHOL, HDL, LDLCALC, TRIG, CHOLHDL, LDLDIRECT in the last 72 hours. Thyroid Function Tests: No results for input(s): TSH, T4TOTAL, T3FREE, THYROIDAB in the last 72 hours.  Invalid input(s): FREET3 Anemia Panel: No results for input(s): VITAMINB12, FOLATE, FERRITIN, TIBC, IRON, RETICCTPCT in the last 72 hours. Coag Panel:   Lab Results  Component Value Date     INR 1.63* 04/26/2015   INR 1.0 07/07/2009   INR 1.1 05/09/2007    RADIOLOGY: Ct Abdomen Pelvis W Contrast  05/27/2015   CLINICAL DATA:  Status post percutaneous cholecystostomy.  EXAM: CT ABDOMEN AND PELVIS WITH CONTRAST  TECHNIQUE: Multidetector CT imaging of the abdomen and pelvis was performed using the standard protocol following bolus administration of intravenous contrast.  CONTRAST:  100 cc of Omnipaque 300  COMPARISON:  04/25/2015  FINDINGS: Lower chest: Dependent changes are noted within the posterior right lung base.  Hepatobiliary: A percutaneous cholecystostomy tube is identified and appears well suture rated within the gallbladder. The gallbladder appears decompressed. Small fluid collection ventral to the right hepatic lobe measures 2.3 x 3.0 x 4.5 cm, image 37/ series 2. This is mixed attenuation may represent a small biloma and/or hematoma. There is no significant biliary dilatation. 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 The hepatic veins appear patent.  Pancreas: Fatty replacement of the pancreas.  Spleen: Normal appearance of the spleen.  Adrenals/Urinary Tract: Left adrenal gland hypertrophy noted. Right adrenal  gland appears normal. Unremarkable appearance of the kidneys. Urinary bladder appears normal.  Stomach/Bowel: The stomach is within normal limits. The small bowel loops have a normal course and caliber. No obstruction. Normal appearance of the colon.  Vascular/Lymphatic: Calcified atherosclerotic disease involves the abdominal aorta. There is an infrarenal abdominal aortic aneurysm which measures 3.7 cm, image 50/series 2. No enlarged retroperitoneal or mesenteric adenopathy. No enlarged pelvic or inguinal lymph nodes.  Reproductive: Previous hysterectomy.  No adnexal mass.  Other: None.  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   IMPRESSION: 1. Interval percutaneous cholecystostomy with decompression of the gallbladder. There is no evidence for biliary obstruction. 2. Small fluid collection ventral to the right lobe of liver is identified. This is mixed attenuation and may represent a small biloma and/or small hematoma. No large hematoma is identified at this time. 3. 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 4. Aortic atherosclerosis 5. Infrarenal abdominal aortic aneurysm. Recommend followup by ultrasound in 2 years. This recommendation follows ACR consensus guidelines: White Paper of the ACR Incidental Findings Committee II on Vascular Findings. J Am Coll Radiol 2013; 10:789-794. 6. Critical Value/emergent results were called by telephone at the time of interpretation on 05/27/2015 at 7:44 pm to Dr. Carmin Muskrat , who verbally acknowledged these results.   Electronically Signed   By: Kerby Moors M.D.   On: 05/27/2015 19:44   Ir Sinus/fist Tube Chk-non Gi  05/27/2015   CLINICAL DATA:  Hemobilia, status post cholecystostomy, tachycardia, diaphoresis  EXAM: Fluoroscopic injection of the existing cholecystostomy  Date:  6/21/20166/21/2016 3:38 pm  Radiologist:  M. Daryll Brod, MD  Guidance:  Fluoroscopic  FLUOROSCOPY TIME:  54 seconds, 50 mGy  MEDICATIONS AND MEDICAL HISTORY: None.  ANESTHESIA/SEDATION: None.  CONTRAST:  56mL OMNIPAQUE IOHEXOL 300 MG/ML  SOLN  COMPLICATIONS: None immediate  PROCEDURE: Informed consent was obtained from the patient following explanation of the procedure, risks, benefits and alternatives. The patient understands, agrees and consents for the procedure. All questions were addressed. A time out was performed.  Under sterile conditions, the existing cholecystostomy was injected with contrast under fluoroscopy. Images obtained for documentation.  Cholangiogram: Stable position of the cholecystostomy tube within the  gallbladder. Filling defects throughout the gallbladder could represent blood clots and/or stones. Cystic duct is patent. Contrast does reach the common hepatic duct. No communication to be any adjacent vascular structure to account for the hemobilia.  IMPRESSION: Cholecystostomy in good position.  Patent cystic duct.  Gallbladder diffuse filling defect compatible with blood clot versus cholelithiasis.   Electronically Signed   By: Jerilynn Mages.  Shick M.D.   On: 05/27/2015 16:26   Dg Chest Port 1 View  05/27/2015   CLINICAL DATA:  Abdominal pain and tachycardia.  EXAM: PORTABLE CHEST - 1 VIEW  COMPARISON:  05/25/2015  FINDINGS: Portable view of the chest demonstrates low lung volumes. Difficult to exclude vascular congestion. Heart size is accentuated by the low lung volumes. Negative for pneumothorax.  IMPRESSION: Low lung volumes without focal disease. Difficult to exclude vascular congestion.   Electronically Signed   By: Markus Daft M.D.   On: 05/27/2015 17:54   Dg Chest Port 1 View  05/25/2015   CLINICAL DATA:  Acute chest pain.  EXAM: PORTABLE CHEST - 1 VIEW  COMPARISON:  Apr 25, 2015.  FINDINGS: Stable cardiomediastinal silhouette. No pneumothorax or pleural effusion is noted. Both lungs are  clear. The visualized skeletal structures are unremarkable.  IMPRESSION: No acute cardiopulmonary abnormality seen.   Electronically Signed   By: Marijo Conception, M.D.   On: 05/25/2015 10:20   Ir Cholangiogram Existing Tube  05/27/2015   CLINICAL DATA:  Hemobilia. Examination of the drainage to for the cholecystostomy tube demonstrates predominately serosanguineous fluid and a few scattered very dark gold blood clots. There is no bright red blood in the drainage bag.  EXAM: CHOLANGIOGRAM VIA EXISTING CATHETER  PROCEDURE: Contrast was injected into the cholecystostomy tube and imaging was obtained.  FINDINGS: There are filling defects in the gallbladder and cystic duct compatible with thrombus. The cystic duct is patent.   IMPRESSION: The cystic duct is patent. Blood clots are present in the gallbladder. Recommendations are for drain capping and resuming bag drainage tomorrow.   Electronically Signed   By: Marybelle Killings M.D.   On: 05/27/2015 07:51      ASSESSMENT/PLAN: 1.  Atrial fibrillation with RVR - no converted to NSR in the 80's.  Continue IV Amio for now.   2D echo 04/2015 with normal LVF and mild LAE.  Keep K+>4 2.  Hypokalemia - replete per IM 3.  HTN - controlled 4.  O2 dependent COPD 5.  Abdominal pain with bloody drainage from cholecystotomy tube and Acute Portal Vein Thrombosis - not on anticoagulation currently due to acute bleeding from cholecystostomy tube. 6.  Sepsis most likely from abdomen with recent acute cholecystitis   Sueanne Margarita, MD  05/28/2015  10:18 AM

## 2015-05-28 NOTE — Clinical Social Work Note (Signed)
Clinical Social Work Assessment  Patient Details  Name: Jennifer [name] MRN: [id number] Date of Birth: 1941/01/08  Date of referral:  05/28/15               Reason for consult:  Facility Placement                Housing/Transportation Living arrangements for the past 2 months:  [place] (Pt was home then went to a SNF ) Source of Information:  Patient Patient Interpreter Needed:  None Criminal Activity/Legal Involvement Pertinent to Current Situation/Hospitalization:  No - Comment as needed Significant Relationships:  Adult Children Lives with:  Self Do you feel safe going back to the place where you live?  Yes Need for family participation in patient care:  No (Coment)  Care giving concerns:  N/A   Facilities manager / plan:  CSW met the pt at bedside. CSW introduced self and purpose of the visit. CSW discussed SNF rehab. Pt reported that she was receiving rehab prior to her admission. Pt reported that she would like to return to [facility]. Pt reported that she did not have any questions at this time. CSW will continue to follow this pt and assist with discharge as needed.   Employment status:  Retired Nurse, adult PT Recommendations:  Not assessed at this time Information / Referral to community resources:   (N/A)  Patient/Family's Response to care: Pt acknowledged the staff's hard work with caring for her.    Patient/Family's Understanding of and Emotional Response to Diagnosis, Current Treatment, and Prognosis:  Pt expressed feelings of fear. Pt reported not fully understanding what was occuring with her body. Pt reported feeling comfort about coming to the hospital, so the doctor can aid in resolving her problem.   Emotional Assessment Appearance:  Appears stated age Attitude/Demeanor/Rapport:   (Sense of Humor) Affect (typically observed):  Appropriate, Pleasant Orientation:  Oriented to Situation, Oriented to   Time, Oriented to Place, Oriented to Self Alcohol / Substance use:  Not Applicable Psych involvement (Current and /or in the community):  No (Comment)  Discharge Needs  Concerns to be addressed:  Denies Needs/Concerns at this time Readmission within the last 30 days:  No Current discharge risk:  None Barriers to Discharge:  No Barriers Identified   Jennifer Litsey, LCSW 05/28/2015, 12:34 PM

## 2015-05-28 NOTE — Progress Notes (Signed)
Pt.'s CPAP was set up at 17cm H2O per pt.'s home settings with a FFM (what pt. Wears at home) Pt. Was unable to tolerate our FFM. Pt.'s daughter is going to [facility] to get pt.'s mask & tubing.

## 2015-05-28 NOTE — Progress Notes (Signed)
Nutrition Brief Note  Patient identified on the Malnutrition Screening Tool (MST) Report. Weight loss has been gradual and not significant for the time frame.   Wt Readings from Last 15 Encounters:  05/27/15 228 lb 2.8 oz (103.5 kg)  05/06/15 221 lb (100.245 kg)  05/02/15 220 lb 14.4 oz (100.2 kg)  01/22/15 239 lb 1.6 oz (108.455 kg)  01/08/15 239 lb (108.41 kg)  12/12/14 239 lb (108.41 kg)  10/14/14 239 lb (108.41 kg)  07/03/14 236 lb 14.4 oz (107.457 kg)  06/05/14 238 lb 8 oz (108.183 kg)  04/03/14 244 lb 12.8 oz (111.041 kg)  01/08/14 249 lb 6.4 oz (113.127 kg)  10/31/13 247 lb (112.038 kg)  09/11/13 251 lb 12.8 oz (114.216 kg)  08/15/13 250 lb 6.4 oz (113.581 kg)  06/15/13 253 lb 9.6 oz (115.032 kg)    Body mass index is 40.43 kg/(m^2). Patient meets criteria for class 3, extreme/morbid obesity based on current BMI.   Current diet order is NPO. Labs and medications reviewed.   No nutrition interventions warranted at this time. If nutrition issues arise, please consult RD.   Molli Barrows, RD, LDN, [place] Pager [phone] After Hours Pager [phone]

## 2015-05-28 NOTE — Consult Note (Signed)
Referring Physician: No referring provider defined for this encounter. Primary Physician: Axel Filler, MD Primary Cardiologist: None Reason for Consultation: A. fib with RVR  HPI:  This 74 year old female with complicated past medical history including hypertension questionable A. fib in the past, COPD well as many other issues who was recently treated for sepsis secondary to cholecystitis. She was deemed not to be a good surgical candidate therefore will a cystectomy tube was placed. Patient presented today with your nausea, vomiting and abdominal pain. She also was noted to have bloody drainage out of her cholecystostomy tube. In the emergency department CT abdomen and pelvis was notable for for possible hematoma or biloma as well as portal vein thrombosis.  Patient went into SVT/A. fib with RVR in the emergency department with heart rates up to 220 bpm. She underwent DC CV as well as received 2 pushes of IV adenosine 6 mg each. She was started on amiodarone drip and by the time of my arrival her heart rate was ~133 bpm In the emergency department patient also received 1.5 L of normal saline, Vanco and Zosyn and was started on 60 mEq of potassium 10 meq/hr IV  The time of my evaluation patient was complaining of severe abdominal pain and overall not feeling well.  Patient denied active symptoms of chest pain, shortness of breath, syncope or presyncope, lower extremity edema, PND or orthopnea, denied feeling with her heart is racing  Review of Systems:  12 systems were reviewed nad were negative except mentioned in the HPI    Past Medical History  Diagnosis Date  . COPD (chronic obstructive pulmonary disease)     O2 dependent. Followed by Dr. Joya Gaskins  . Fibromyalgia   . Hypertension   . OSA (obstructive sleep apnea)     Sleep study done April 02, 2008 showed severe obstructive sleep apnea.  . Diastolic dysfunction     Grade I diastolic dysfunction as shown on ECHO Dec 2011.   EF of 65-70%.  Marland Kitchen COPD (chronic obstructive pulmonary disease)     Followed by Dr. Joya Gaskins  . Dyslipidemia   . Osteopenia     DXA scan done on 10/23/2009 showed osteopenia of AP spine (young adult T-score -1.3), osteopenia of left femur neck (young adult T-score -1.8), and normal bone density of right femur neck (young adult T-score -0.8), with a FRAX estimate of the 10-year probability of a major osteoporotic fracture of 9.7% and probability of hip fracture 1.6%.  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 Estanislado Pandy on 11/10/2004.  MRA of the head on 03/09/2012 showed no evidence for recanalization.  . Osteoarthrosis involving multiple sites   . Chronic back pain   . Depression   . Anxiety   . Anemia   . GERD (gastroesophageal reflux disease)   . Meniere disease     Followed by ENT Dr. Silvestre Moment  . Diverticulosis of colon   . Multiple pigmented nevi   . Hypokalemia   . S/P hysterectomy 1975  . Pneumonia 11/2004  . Knee pain   . Dysphagia   . Urosepsis 11/04/2010    Klebsiella pneumoniae  . Breast pain   . Allergic rhinitis   . Fibromyalgia   . 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     . Urinary incontinence    Past Surgical History  Procedure Laterality Date  . Abdominal hysterectomy  1975  . 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 Estanislado Pandy on 11/10/2004.     Current Medications: . sodium chloride  1,000 mL Intravenous Once  . digoxin  0.5 mg Intravenous STAT  . mometasone-formoterol  2 puff Inhalation BID  . piperacillin-tazobactam (ZOSYN)  IV  3.375 g Intravenous 3 times per day  . potassium  chloride  10 mEq Intravenous Q1 Hr x 6  . sertraline  100 mg Oral BID  . tiotropium  18 mcg Inhalation Daily  . topiramate  50 mg Oral BID  . vancomycin  1,250 mg Intravenous Q12H   Infusions:  . sodium chloride 100 mL/hr at 05/28/15 0012    Prescriptions prior to admission  Medication Sig Dispense Refill Last Dose  . albuterol (PROAIR HFA) 108 (90 BASE) MCG/ACT inhaler INHALE 2 PUFFS BY MOUTH INTO LUNGS FOUR TIMES DAILY AS NEEDED (Patient taking differently: Inhale 1-2 puffs into the lungs every 6 (six) hours as needed for wheezing or shortness of breath. ) 3 Inhaler 6 Past Month at Unknown time  . ALPRAZolam (XANAX) 0.5 MG tablet Take 1 tablet (0.5 mg total) by mouth 2 (two) times daily as needed for anxiety. 20 tablet 0 05/27/2015 at Unknown time  . amitriptyline (ELAVIL) 50 MG tablet Take 1 tablet (50 mg total) by mouth at bedtime. 30 tablet 3 05/26/2015 at Unknown time  . amoxicillin-clavulanate (AUGMENTIN) 875-125 MG per tablet Take 1 tablet by mouth every 12 (twelve) hours. 1 tablet 0 05/27/2015 at Unknown time  . aspirin 81 MG EC tablet Take 81 mg by mouth daily.     05/27/2015 at Unknown time  . carisoprodol (SOMA) 350 MG tablet Take 1 tablet (350 mg total) by mouth daily as needed for muscle spasms. 30 tablet 3 05/26/2015 at Unknown time  . Cholecalciferol (VITAMIN D3) 1000 UNITS CAPS Take 1 capsule (1,000 Units total) by mouth daily. 30 capsule 2 05/27/2015 at Unknown time  . CINNAMON PO Take 1 tablet by mouth 2 (two) times daily.    05/27/2015 at Unknown time  . dextromethorphan-guaiFENesin (MUCINEX DM) 30-600 MG per 12 hr tablet Take 1 tablet by mouth 2 (two) times daily.   05/27/2015 at Unknown time  . docusate sodium (COLACE) 100 MG capsule Take 100 mg by mouth daily as needed for mild constipation or moderate constipation.   prn at Unknown time  . Fluticasone-Salmeterol (ADVAIR DISKUS) 250-50 MCG/DOSE AEPB Inhale 1 puff into the lungs 2 (two) times daily. 180 each 4 05/27/2015 at Unknown  time  . hydrochlorothiazide (HYDRODIURIL) 25 MG tablet Take 0.5 tablets (12.5 mg total) by mouth daily. 45 tablet 3 05/27/2015 at Unknown time  . HYDROcodone-acetaminophen (NORCO/VICODIN) 5-325 MG per tablet Take 2 tablets by mouth every 4 (four) hours as needed. (Patient taking differently: Take 2 tablets by mouth every 4 (four) hours as needed for moderate pain. ) 10 tablet 0 prn at Unknown time  . hydroxypropyl methylcellulose (ISOPTO TEARS) 2.5 % ophthalmic solution Place 2 drops into both eyes 3 (three) times daily as needed. Dry eye   prn at Unknown time  . ibuprofen (ADVIL,MOTRIN) 200 MG tablet Take 200 mg by mouth every 6 (six) hours as needed for moderate pain. For pain    prn at Unknown time  . meclizine (  ANTIVERT) 12.5 MG tablet Take 1 tablet (12.5 mg total) by mouth 2 (two) times daily as needed for dizziness. 180 tablet 0 prn at Unknown time  . Multiple Vitamin (MULTIVITAMIN) tablet Take 1 tablet by mouth daily.   05/27/2015 at Unknown time  . NIFEdipine (ADALAT CC) 90 MG 24 hr tablet Take 1 tablet (90 mg total) by mouth daily. 90 tablet 2 05/27/2015 at Unknown time  . omeprazole (PRILOSEC) 20 MG capsule Take 1 capsule (20 mg total) by mouth 2 (two) times daily. 180 capsule 3 05/27/2015 at Unknown time  . ondansetron (ZOFRAN ODT) 4 MG disintegrating tablet Take 1 tablet (4 mg total) by mouth every 8 (eight) hours as needed for nausea. 20 tablet 0 prn at Unknown time  . potassium chloride SA (K-DUR,KLOR-CON) 20 MEQ tablet Take 20 mEq by mouth 2 (two) times daily.   05/27/2015 at Unknown time  . ranitidine (ZANTAC) 150 MG tablet Take 1 tablet (150 mg total) by mouth at bedtime. 90 tablet 1 05/26/2015 at Unknown time  . Red Yeast Rice Extract (RED YEAST RICE PO) Take 1 tablet by mouth 2 (two) times daily.    05/27/2015 at Unknown time  . sertraline (ZOLOFT) 100 MG tablet Take 1 tablet (100 mg total) by mouth 2 (two) times daily. 180 tablet 1 05/27/2015 at Unknown time  . tiotropium (SPIRIVA  HANDIHALER) 18 MCG inhalation capsule Place 1 capsule (18 mcg total) into inhaler and inhale daily. 90 capsule 4 05/27/2015 at Unknown time  . topiramate (TOPAMAX) 50 MG tablet Take 1 tablet (50 mg total) by mouth 2 (two) times daily. 60 tablet 2 05/27/2015 at Unknown time  . triamcinolone cream (KENALOG) 0.1 % APPLY TOPICALLY TO THE AFFECTED AREA OF HAND TWICE DAILY AS NEEDED (Patient not taking: Reported on 05/25/2015) 45 g 1 Not Taking at Unknown time     Allergies  Allergen Reactions  . Tetracycline Other (See Comments)    REACTION: stomach upset  . Flonase [Fluticasone] Cough  . Lisinopril Cough    History   Social History  . Marital Status: Widowed    Spouse Name: N/A  . Number of Children: N/A  . Years of Education: N/A   Occupational History  . Not on file.   Social History Main Topics  . Smoking status: Former Smoker -- 0.50 packs/day for 30 years    Types: Cigarettes    Quit date: 12/07/2008  . Smokeless tobacco: Former Systems developer    Types: [place] date: 12/06/1978  . Alcohol Use: No  . Drug Use: No  . Sexual Activity: No   Other Topics Concern  . Not on file   Social History Narrative   Financial assistance approved for 100% discount after Medicare pays for [facility] only, not eligible for [facility] card. Per Bonna Gains    Family History  Problem Relation Age of Onset  . Ulcers Mother   . Ovarian cancer Sister 42  . Cervical cancer Daughter   . Cervical cancer Daughter   . Lung cancer Neg Hx   . Colon cancer Neg Hx   . Heart attack Father   . Cerebral aneurysm Sister 66   Family Status  Relation Status Death Age  . Mother Deceased 30  . Sister Deceased Age 54  . Daughter Alive   . Daughter Alive   . Father Deceased 50s    Died of heart attack  . Brother Alive     PHYSICAL EXAM: Filed Vitals:   05/27/15  2338  BP: 141/82  Pulse: 133  Temp:   Resp: 23    No intake or output data in the 24 hours ending 05/28/15 0014  General:  Well appearing. No  respiratory difficulty HEENT: normal Neck: supple. no JVD. Carotids 2+ bilat; no bruits. No lymphadenopathy or thryomegaly appreciated. Cor: PMI nondisplaced. Regular rate & rhythm. No rubs, gallops or murmurs. Lungs: clear Abdomen: The tender to mild palpation or percussion. Extremities: no cyanosis, clubbing, rash, edema Neuro: alert & oriented x 3, cranial nerves grossly intact. moves all 4 extremities w/o difficulty. Affect pleasant.  Limited bedside echocardiogram: Grossly normal RV and LV size, hyperdynamic, inferior vena cava is normal size with appears mildly decreased respiratory variation  Results for orders placed or performed during the hospital encounter of 05/27/15 (from the past 24 hour(s))  CBC with Differential     Status: Abnormal   Collection Time: 05/27/15  4:00 PM  Result Value Ref Range   WBC 15.5 (H) 4.0 - 10.5 K/uL   RBC 3.46 (L) 3.87 - 5.11 MIL/uL   Hemoglobin 11.3 (L) 12.0 - 15.0 g/dL   HCT 33.4 (L) 36.0 - 46.0 %   MCV 96.5 78.0 - 100.0 fL   MCH 32.7 26.0 - 34.0 pg   MCHC 33.8 30.0 - 36.0 g/dL   RDW 14.1 11.5 - 15.5 %   Platelets 236 150 - 400 K/uL   Neutrophils Relative % 79 (H) 43 - 77 %   Neutro Abs 12.1 (H) 1.7 - 7.7 K/uL   Lymphocytes Relative 10 (L) 12 - 46 %   Lymphs Abs 1.6 0.7 - 4.0 K/uL   Monocytes Relative 11 3 - 12 %   Monocytes Absolute 1.7 (H) 0.1 - 1.0 K/uL   Eosinophils Relative 0 0 - 5 %   Eosinophils Absolute 0.0 0.0 - 0.7 K/uL   Basophils Relative 0 0 - 1 %   Basophils Absolute 0.0 0.0 - 0.1 K/uL  Comprehensive metabolic panel     Status: Abnormal   Collection Time: 05/27/15  4:00 PM  Result Value Ref Range   Sodium 132 (L) 135 - 145 mmol/L   Potassium 2.8 (L) 3.5 - 5.1 mmol/L   Chloride 97 (L) 101 - 111 mmol/L   CO2 23 22 - 32 mmol/L   Glucose, Bld 119 (H) 65 - 99 mg/dL   BUN 13 6 - 20 mg/dL   Creatinine, Ser 0.68 0.44 - 1.00 mg/dL   Calcium 8.9 8.9 - 10.3 mg/dL   Total Protein 6.4 (L) 6.5 - 8.1 g/dL   Albumin 2.5 (L) 3.5 -  5.0 g/dL   AST 143 (H) 15 - 41 U/L   ALT 523 (H) 14 - 54 U/L   Alkaline Phosphatase 249 (H) 38 - 126 U/L   Total Bilirubin 3.1 (H) 0.3 - 1.2 mg/dL   GFR calc non Af Amer >60 >60 mL/min   GFR calc Af Amer >60 >60 mL/min   Anion gap 12 5 - 15  Lipase, blood     Status: Abnormal   Collection Time: 05/27/15  4:00 PM  Result Value Ref Range   Lipase 11 (L) 22 - 51 U/L  Magnesium     Status: None   Collection Time: 05/27/15  4:00 PM  Result Value Ref Range   Magnesium 1.8 1.7 - 2.4 mg/dL  Urinalysis, Routine w reflex microscopic (not at [facility])     Status: Abnormal   Collection Time: 05/27/15  4:43 PM  Result Value Ref Range  Color, Urine AMBER (A) YELLOW   APPearance HAZY (A) CLEAR   Specific Gravity, Urine 1.016 1.005 - 1.030   pH 5.5 5.0 - 8.0   Glucose, UA NEGATIVE NEGATIVE mg/dL   Hgb urine dipstick NEGATIVE NEGATIVE   Bilirubin Urine MODERATE (A) NEGATIVE   Ketones, ur NEGATIVE NEGATIVE mg/dL   Protein, ur NEGATIVE NEGATIVE mg/dL   Urobilinogen, UA 0.2 0.0 - 1.0 mg/dL   Nitrite NEGATIVE NEGATIVE   Leukocytes, UA SMALL (A) NEGATIVE  Urine microscopic-add on     Status: Abnormal   Collection Time: 05/27/15  4:43 PM  Result Value Ref Range   Squamous Epithelial / LPF MANY (A) RARE   WBC, UA 0-2 <3 WBC/hpf   Bacteria, UA RARE RARE   Casts GRANULAR CAST (A) NEGATIVE   Urine-Other MUCOUS PRESENT   I-Stat CG4 Lactic Acid, ED  (not at  [facility])     Status: None   Collection Time: 05/27/15  6:01 PM  Result Value Ref Range   Lactic Acid, Venous 0.95 0.5 - 2.0 mmol/L  I-Stat CG4 Lactic Acid, ED  (not at  [facility])     Status: None   Collection Time: 05/27/15  8:54 PM  Result Value Ref Range   Lactic Acid, Venous 0.97 0.5 - 2.0 mmol/L   Radiology:  Ct Abdomen Pelvis W Contrast  05/27/2015   CLINICAL DATA:  Status post percutaneous cholecystostomy.  EXAM: CT ABDOMEN AND PELVIS WITH CONTRAST  TECHNIQUE: Multidetector CT imaging of the abdomen and pelvis was performed using the standard  protocol following bolus administration of intravenous contrast.  CONTRAST:  100 cc of Omnipaque 300  COMPARISON:  04/25/2015  FINDINGS: Lower chest: Dependent changes are noted within the posterior right lung base.  Hepatobiliary: A percutaneous cholecystostomy tube is identified and appears well suture rated within the gallbladder. The gallbladder appears decompressed. Small fluid collection ventral to the right hepatic lobe measures 2.3 x 3.0 x 4.5 cm, image 37/ series 2. This is mixed attenuation may represent a small biloma and/or hematoma. There is no significant biliary dilatation. 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 The hepatic veins appear patent.  Pancreas: Fatty replacement of the pancreas.  Spleen: Normal appearance of the spleen.  Adrenals/Urinary Tract: Left adrenal gland hypertrophy noted. Right adrenal gland appears normal. Unremarkable appearance of the kidneys. Urinary bladder appears normal.  Stomach/Bowel: The stomach is within normal limits. The small bowel loops have a normal course and caliber. No obstruction. Normal appearance of the colon.  Vascular/Lymphatic: Calcified atherosclerotic disease involves the abdominal aorta. There is an infrarenal abdominal aortic aneurysm which measures 3.7 cm, image 50/series 2. No enlarged retroperitoneal or mesenteric adenopathy. No enlarged pelvic or inguinal lymph nodes.  Reproductive: Previous hysterectomy.  No adnexal mass.  Other: None.  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  IMPRESSION: 1. Interval percutaneous cholecystostomy with decompression of the gallbladder. There is no evidence for biliary obstruction. 2. Small fluid collection ventral to the right lobe of liver is identified. This is mixed attenuation and may represent a small biloma and/or small hematoma. No large hematoma is identified at this  time. 3. 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 4. Aortic atherosclerosis 5. Infrarenal abdominal aortic aneurysm. Recommend followup by ultrasound in 2 years. This recommendation follows ACR  consensus guidelines: White Paper of the ACR Incidental Findings Committee II on Vascular Findings. J Am Coll Radiol 2013; 10:789-794. 6. Critical Value/emergent results were called by telephone at the time of interpretation on 05/27/2015 at 7:44 pm to Dr. Carmin Muskrat , who verbally acknowledged these results.   Electronically Signed   By: Kerby Moors M.D.   On: 05/27/2015 19:44   Ir Sinus/fist Tube Chk-non Gi  05/27/2015   CLINICAL DATA:  Hemobilia, status post cholecystostomy, tachycardia, diaphoresis  EXAM: Fluoroscopic injection of the existing cholecystostomy  Date:  6/21/20166/21/2016 3:38 pm  Radiologist:  M. Daryll Brod, MD  Guidance:  Fluoroscopic  FLUOROSCOPY TIME:  54 seconds, 50 mGy  MEDICATIONS AND MEDICAL HISTORY: None.  ANESTHESIA/SEDATION: None.  CONTRAST:  45mL OMNIPAQUE IOHEXOL 300 MG/ML  SOLN  COMPLICATIONS: None immediate  PROCEDURE: Informed consent was obtained from the patient following explanation of the procedure, risks, benefits and alternatives. The patient understands, agrees and consents for the procedure. All questions were addressed. A time out was performed.  Under sterile conditions, the existing cholecystostomy was injected with contrast under fluoroscopy. Images obtained for documentation.  Cholangiogram: Stable position of the cholecystostomy tube within the gallbladder. Filling defects throughout the gallbladder could represent blood clots and/or stones. Cystic duct is patent. Contrast does reach the common hepatic duct. No communication to be any adjacent vascular structure to account for the hemobilia.  IMPRESSION: Cholecystostomy in good position.  Patent cystic duct.  Gallbladder diffuse  filling defect compatible with blood clot versus cholelithiasis.   Electronically Signed   By: Jerilynn Mages.  Shick M.D.   On: 05/27/2015 16:26   Dg Chest Port 1 View  05/27/2015   CLINICAL DATA:  Abdominal pain and tachycardia.  EXAM: PORTABLE CHEST - 1 VIEW  COMPARISON:  05/25/2015  FINDINGS: Portable view of the chest demonstrates low lung volumes. Difficult to exclude vascular congestion. Heart size is accentuated by the low lung volumes. Negative for pneumothorax.  IMPRESSION: Low lung volumes without focal disease. Difficult to exclude vascular congestion.   Electronically Signed   By: Markus Daft M.D.   On: 05/27/2015 17:54   EKG with A. fib RVR heart rate 140 bpm  ASSESSMENT: Atrial fibrillation with RVR  PLAN/DISCUSSION: Patient is currently in A. fib with RVR. She has questionable history of atrial fibrillation according to her report. She has multiple driving factors for A. fib including: Intra-abdominal sepsis, severe pain, severe hypokalemia.  - I have ordered digoxin 500 g 1 IV - Continue amiodarone drip for now - Patient will benefit from anticoagulation once the bloody biliary discharged has been sorted out, she also has portal vein thromboses therefore she will need to be anti-coagulated anyway - Please aggressively treat pain - Patient appears to be reasonably volume resuscitated, she may benefit from another 1-2 L of IV crystalloids but not much more than that - Please aggressively supplement hypokalemia (my guess patient has at least 200 mEq of potassium deficit). Check magnesium level and supplement hypomagnesemia if present  Cardiology will follow up  Inez Pilgrim, MD 05/28/2015 12:14 AM

## 2015-05-28 NOTE — Progress Notes (Signed)
Referring Physician(s): TS  Subjective:  Perc chole drain placed 5/21 in IR Pt was seen 6/19 with blood in drain bag Injection performed showed good position with clots in GB: capped and re opened following day. Returned 6/21 with continued bleeding into bag CT: revealed good position Now admitted to Hospital with SVT and cardiology evaluating pt Asked to come by to see pt Make any recommendations  Dr Earleen Newport has reviewed chart and imaging Agrees chole drain is in good position  Pt feeling better today Output from drain is bilious   Allergies: Tetracycline; Flonase; and Lisinopril  Medications: Prior to Admission medications   Medication Sig Start Date End Date Taking? Authorizing Provider  albuterol (PROAIR HFA) 108 (90 BASE) MCG/ACT inhaler INHALE 2 PUFFS BY MOUTH INTO LUNGS FOUR TIMES DAILY AS NEEDED Patient taking differently: Inhale 1-2 puffs into the lungs every 6 (six) hours as needed for wheezing or shortness of breath.  10/14/14  Yes Elsie Stain, MD  ALPRAZolam Duanne Moron) 0.5 MG tablet Take 1 tablet (0.5 mg total) by mouth 2 (two) times daily as needed for anxiety. 05/02/15  Yes Rushil Sherrye Payor, MD  amitriptyline (ELAVIL) 50 MG tablet Take 1 tablet (50 mg total) by mouth at bedtime. 01/03/15  Yes Bertha Stakes, MD  amoxicillin-clavulanate (AUGMENTIN) 875-125 MG per tablet Take 1 tablet by mouth every 12 (twelve) hours. 05/02/15  Yes Rushil Sherrye Payor, MD  aspirin 81 MG EC tablet Take 81 mg by mouth daily.     Yes Historical Provider, MD  carisoprodol (SOMA) 350 MG tablet Take 1 tablet (350 mg total) by mouth daily as needed for muscle spasms. 01/03/15  Yes Bertha Stakes, MD  Cholecalciferol (VITAMIN D3) 1000 UNITS CAPS Take 1 capsule (1,000 Units total) by mouth daily. 01/23/15  Yes Bertha Stakes, MD  CINNAMON PO Take 1 tablet by mouth 2 (two) times daily.    Yes Historical Provider, MD  dextromethorphan-guaiFENesin (MUCINEX DM) 30-600 MG per 12 hr tablet Take 1 tablet by mouth  2 (two) times daily.   Yes Historical Provider, MD  docusate sodium (COLACE) 100 MG capsule Take 100 mg by mouth daily as needed for mild constipation or moderate constipation.   Yes Historical Provider, MD  Fluticasone-Salmeterol (ADVAIR DISKUS) 250-50 MCG/DOSE AEPB Inhale 1 puff into the lungs 2 (two) times daily. 10/14/14  Yes Elsie Stain, MD  hydrochlorothiazide (HYDRODIURIL) 25 MG tablet Take 0.5 tablets (12.5 mg total) by mouth daily. 03/07/14  Yes Bertha Stakes, MD  HYDROcodone-acetaminophen (NORCO/VICODIN) 5-325 MG per tablet Take 2 tablets by mouth every 4 (four) hours as needed. Patient taking differently: Take 2 tablets by mouth every 4 (four) hours as needed for moderate pain.  05/25/15  Yes Tanna Furry, MD  hydroxypropyl methylcellulose (ISOPTO TEARS) 2.5 % ophthalmic solution Place 2 drops into both eyes 3 (three) times daily as needed. Dry eye   Yes Historical Provider, MD  ibuprofen (ADVIL,MOTRIN) 200 MG tablet Take 200 mg by mouth every 6 (six) hours as needed for moderate pain. For pain    Yes Historical Provider, MD  meclizine (ANTIVERT) 12.5 MG tablet Take 1 tablet (12.5 mg total) by mouth 2 (two) times daily as needed for dizziness. 11/20/14  Yes Bertha Stakes, MD  Multiple Vitamin (MULTIVITAMIN) tablet Take 1 tablet by mouth daily.   Yes Historical Provider, MD  NIFEdipine (ADALAT CC) 90 MG 24 hr tablet Take 1 tablet (90 mg total) by mouth daily. 09/06/14  Yes Bertha Stakes, MD  omeprazole ([place])  20 MG capsule Take 1 capsule (20 mg total) by mouth 2 (two) times daily. 05/06/15  Yes Oval Linsey, MD  ondansetron (ZOFRAN ODT) 4 MG disintegrating tablet Take 1 tablet (4 mg total) by mouth every 8 (eight) hours as needed for nausea. 05/25/15  Yes Tanna Furry, MD  potassium chloride SA (K-DUR,KLOR-CON) 20 MEQ tablet Take 20 mEq by mouth 2 (two) times daily.   Yes Historical Provider, MD  ranitidine (ZANTAC) 150 MG tablet Take 1 tablet (150 mg total) by mouth at bedtime. 03/05/15  Yes  Bertha Stakes, MD  Red Yeast Rice Extract (RED YEAST RICE PO) Take 1 tablet by mouth 2 (two) times daily.    Yes Historical Provider, MD  sertraline (ZOLOFT) 100 MG tablet Take 1 tablet (100 mg total) by mouth 2 (two) times daily. 09/06/14  Yes Bertha Stakes, MD  tiotropium (SPIRIVA HANDIHALER) 18 MCG inhalation capsule Place 1 capsule (18 mcg total) into inhaler and inhale daily. 10/14/14  Yes Elsie Stain, MD  topiramate (TOPAMAX) 50 MG tablet Take 1 tablet (50 mg total) by mouth 2 (two) times daily. 04/02/15  Yes Bertha Stakes, MD  triamcinolone cream (KENALOG) 0.1 % APPLY TOPICALLY TO THE AFFECTED AREA OF HAND TWICE DAILY AS NEEDED Patient not taking: Reported on 05/25/2015 03/07/14   Bertha Stakes, MD     Vital Signs: BP 137/79 mmHg  Pulse 133  Temp(Src) 97.6 F (36.4 C) (Oral)  Resp 20  Ht 5\' 3"  (1.6 m)  Wt 228 lb 2.8 oz (103.5 kg)  BMI 40.43 kg/m2  SpO2 96%  Physical Exam  Abdominal: Soft. Bowel sounds are normal. She exhibits distension. There is tenderness.  Skin: Skin is warm and dry.  Site of chole drain is clean and dry Output of drain bilious 160 cc recorded yesterday 100 cc in bag now      Imaging: Ct Abdomen Pelvis W Contrast  05/27/2015   CLINICAL DATA:  Status post percutaneous cholecystostomy.  EXAM: CT ABDOMEN AND PELVIS WITH CONTRAST  TECHNIQUE: Multidetector CT imaging of the abdomen and pelvis was performed using the standard protocol following bolus administration of intravenous contrast.  CONTRAST:  100 cc of Omnipaque 300  COMPARISON:  04/25/2015  FINDINGS: Lower chest: Dependent changes are noted within the posterior right lung base.  Hepatobiliary: A percutaneous cholecystostomy tube is identified and appears well suture rated within the gallbladder. The gallbladder appears decompressed. Small fluid collection ventral to the right hepatic lobe measures 2.3 x 3.0 x 4.5 cm, image 37/ series 2. This is mixed attenuation may represent a small biloma and/or hematoma.  There is no significant biliary dilatation. 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 The hepatic veins appear patent.  Pancreas: Fatty replacement of the pancreas.  Spleen: Normal appearance of the spleen.  Adrenals/Urinary Tract: Left adrenal gland hypertrophy noted. Right adrenal gland appears normal. Unremarkable appearance of the kidneys. Urinary bladder appears normal.  Stomach/Bowel: The stomach is within normal limits. The small bowel loops have a normal course and caliber. No obstruction. Normal appearance of the colon.  Vascular/Lymphatic: Calcified atherosclerotic disease involves the abdominal aorta. There is an infrarenal abdominal aortic aneurysm which measures 3.7 cm, image 50/series 2. No enlarged retroperitoneal or mesenteric adenopathy. No enlarged pelvic or inguinal lymph nodes.  Reproductive: Previous hysterectomy.  No adnexal mass.  Other: None.  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  IMPRESSION: 1. Interval percutaneous cholecystostomy with decompression of the gallbladder. There is no evidence for biliary obstruction. 2. Small fluid collection ventral to the right lobe of liver is identified. This is mixed attenuation and may represent a small biloma and/or small hematoma. No large hematoma is identified at this time. 3. 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 4. Aortic atherosclerosis 5. Infrarenal abdominal aortic aneurysm. Recommend followup by ultrasound in 2 years. This recommendation follows ACR consensus guidelines: White Paper of the ACR Incidental Findings Committee II on Vascular Findings. J Am Coll Radiol 2013; 10:789-794. 6. Critical Value/emergent results were called by telephone at the time of interpretation on 05/27/2015 at 7:44 pm to Dr. Carmin Muskrat ,  who verbally acknowledged these results.   Electronically Signed   By: Kerby Moors M.D.   On: 05/27/2015 19:44   Ir Sinus/fist Tube Chk-non Gi  05/27/2015   CLINICAL DATA:  Hemobilia, status post cholecystostomy, tachycardia, diaphoresis  EXAM: Fluoroscopic injection of the existing cholecystostomy  Date:  6/21/20166/21/2016 3:38 pm  Radiologist:  M. Daryll Brod, MD  Guidance:  Fluoroscopic  FLUOROSCOPY TIME:  54 seconds, 50 mGy  MEDICATIONS AND MEDICAL HISTORY: None.  ANESTHESIA/SEDATION: None.  CONTRAST:  4mL OMNIPAQUE IOHEXOL 300 MG/ML  SOLN  COMPLICATIONS: None immediate  PROCEDURE: Informed consent was obtained from the patient following explanation of the procedure, risks, benefits and alternatives. The patient understands, agrees and consents for the procedure. All questions were addressed. A time out was performed.  Under sterile conditions, the existing cholecystostomy was injected with contrast under fluoroscopy. Images obtained for documentation.  Cholangiogram: Stable position of the cholecystostomy tube within the gallbladder. Filling defects throughout the gallbladder could represent blood clots and/or stones. Cystic duct is patent. Contrast does reach the common hepatic duct. No communication to be any adjacent vascular structure to account for the hemobilia.  IMPRESSION: Cholecystostomy in good position.  Patent cystic duct.  Gallbladder diffuse filling defect compatible with blood clot versus cholelithiasis.   Electronically Signed   By: Jerilynn Mages.  Shick M.D.   On: 05/27/2015 16:26   Dg Chest Port 1 View  05/27/2015   CLINICAL DATA:  Abdominal pain and tachycardia.  EXAM: PORTABLE CHEST - 1 VIEW  COMPARISON:  05/25/2015  FINDINGS: Portable view of the chest demonstrates low lung volumes. Difficult to exclude vascular congestion. Heart size is accentuated by the low lung volumes. Negative for pneumothorax.  IMPRESSION: Low lung volumes without focal disease. Difficult to exclude vascular  congestion.   Electronically Signed   By: Markus Daft M.D.   On: 05/27/2015 17:54   Dg Chest Port 1 View  05/25/2015   CLINICAL DATA:  Acute chest pain.  EXAM: PORTABLE CHEST - 1 VIEW  COMPARISON:  Apr 25, 2015.  FINDINGS: Stable cardiomediastinal silhouette. No pneumothorax or pleural effusion is noted. Both lungs are clear. The visualized skeletal structures are unremarkable.  IMPRESSION: No acute cardiopulmonary abnormality seen.   Electronically Signed   By: Marijo Conception, M.D.   On: 05/25/2015 10:20   Ir Cholangiogram Existing Tube  05/27/2015   CLINICAL DATA:  Hemobilia. Examination of the drainage to for the cholecystostomy tube demonstrates predominately serosanguineous fluid and a few scattered very dark gold blood clots. There is no bright red blood in the drainage bag.  EXAM: CHOLANGIOGRAM VIA EXISTING CATHETER  PROCEDURE: Contrast was injected into the cholecystostomy tube and imaging was obtained.  FINDINGS:  There are filling defects in the gallbladder and cystic duct compatible with thrombus. The cystic duct is patent.  IMPRESSION: The cystic duct is patent. Blood clots are present in the gallbladder. Recommendations are for drain capping and resuming bag drainage tomorrow.   Electronically Signed   By: Marybelle Killings M.D.   On: 05/27/2015 07:51    Labs:  CBC:  Recent Labs  05/01/15 0340 05/25/15 1006 05/27/15 1600 05/28/15 0235  WBC 16.1* 8.3 15.5* 14.6*  HGB 13.0 12.2 11.3* 10.6*  HCT 36.8 36.7 33.4* 31.4*  PLT PLATELET CLUMPS NOTED ON SMEAR, COUNT APPEARS ADEQUATE 210 236 207    COAGS:  Recent Labs  04/26/15 1110  INR 1.63*  APTT 42*    BMP:  Recent Labs  05/02/15 0445 05/25/15 1006 05/27/15 1600 05/28/15 0235  NA 134* 136 132* 132*  K 3.7 3.4* 2.8* 2.9*  CL 98* 107 97* 102  CO2 26 22 23 23   GLUCOSE 109* 202* 119* 122*  BUN 8 10 13 6   CALCIUM 9.2 9.3 8.9 8.2*  CREATININE 0.75 0.58 0.68 0.59  GFRNONAA >60 >60 >60 >60  GFRAA >60 >60 >60 >60    LIVER  FUNCTION TESTS:  Recent Labs  04/27/15 0617 05/25/15 1006 05/27/15 1600 05/28/15 0235  BILITOT 0.4 1.6* 3.1* 2.4*  AST 16 1065* 143* 68*  ALT 13* 493* 523* 334*  ALKPHOS 79 240* 249* 228*  PROT 5.7* 7.0 6.4* 5.2*  ALBUMIN 2.3* 3.0* 2.5* 2.1*    Assessment and Plan:  Chole drain placed 04/26/15 Must remain 6-8 weeks Plan per [facility]  Drain is in good position Draining well; bilious material May anticoagulate pt now if needed- no contraindication per Dr Earleen Newport If bleeding recurs may need GB drain tract injection/imaging   Signed: Ayauna Mcnay A 05/28/2015, 12:05 PM   I spent a total of 15 Minutes in face to face in clinical consultation/evaluation, greater than 50% of which was counseling/coordinating care for chole drain

## 2015-05-29 DIAGNOSIS — I714 Abdominal aortic aneurysm, without rupture, unspecified: Secondary | ICD-10-CM | POA: Diagnosis present

## 2015-05-29 DIAGNOSIS — E876 Hypokalemia: Secondary | ICD-10-CM

## 2015-05-29 DIAGNOSIS — I480 Paroxysmal atrial fibrillation: Secondary | ICD-10-CM

## 2015-05-29 DIAGNOSIS — K838 Other specified diseases of biliary tract: Secondary | ICD-10-CM | POA: Insufficient documentation

## 2015-05-29 DIAGNOSIS — A419 Sepsis, unspecified organism: Secondary | ICD-10-CM

## 2015-05-29 LAB — COMPREHENSIVE METABOLIC PANEL
ALK PHOS: 195 U/L — AB (ref 38–126)
ALT: 195 U/L — ABNORMAL HIGH (ref 14–54)
AST: 26 U/L (ref 15–41)
Albumin: 1.9 g/dL — ABNORMAL LOW (ref 3.5–5.0)
Anion gap: 6 (ref 5–15)
BILIRUBIN TOTAL: 1.2 mg/dL (ref 0.3–1.2)
BUN: 5 mg/dL — ABNORMAL LOW (ref 6–20)
CHLORIDE: 104 mmol/L (ref 101–111)
CO2: 24 mmol/L (ref 22–32)
Calcium: 8.6 mg/dL — ABNORMAL LOW (ref 8.9–10.3)
Creatinine, Ser: 0.57 mg/dL (ref 0.44–1.00)
GLUCOSE: 129 mg/dL — AB (ref 65–99)
POTASSIUM: 3.2 mmol/L — AB (ref 3.5–5.1)
Sodium: 134 mmol/L — ABNORMAL LOW (ref 135–145)
Total Protein: 5.5 g/dL — ABNORMAL LOW (ref 6.5–8.1)

## 2015-05-29 LAB — CBC WITH DIFFERENTIAL/PLATELET
Basophils Absolute: 0 10*3/uL (ref 0.0–0.1)
Basophils Relative: 0 % (ref 0–1)
EOS ABS: 0 10*3/uL (ref 0.0–0.7)
Eosinophils Relative: 0 % (ref 0–5)
HCT: 28.7 % — ABNORMAL LOW (ref 36.0–46.0)
Hemoglobin: 9.5 g/dL — ABNORMAL LOW (ref 12.0–15.0)
LYMPHS ABS: 1.3 10*3/uL (ref 0.7–4.0)
Lymphocytes Relative: 10 % — ABNORMAL LOW (ref 12–46)
MCH: 32.3 pg (ref 26.0–34.0)
MCHC: 33.1 g/dL (ref 30.0–36.0)
MCV: 97.6 fL (ref 78.0–100.0)
Monocytes Absolute: 1.6 10*3/uL — ABNORMAL HIGH (ref 0.1–1.0)
Monocytes Relative: 12 % (ref 3–12)
NEUTROS PCT: 78 % — AB (ref 43–77)
Neutro Abs: 10.2 10*3/uL — ABNORMAL HIGH (ref 1.7–7.7)
Platelets: 210 10*3/uL (ref 150–400)
RBC: 2.94 MIL/uL — AB (ref 3.87–5.11)
RDW: 14.6 % (ref 11.5–15.5)
WBC: 13.1 10*3/uL — AB (ref 4.0–10.5)

## 2015-05-29 LAB — VANCOMYCIN, TROUGH: Vancomycin Tr: 13 ug/mL (ref 10.0–20.0)

## 2015-05-29 LAB — CBC
HEMATOCRIT: 30.3 % — AB (ref 36.0–46.0)
HEMOGLOBIN: 9.9 g/dL — AB (ref 12.0–15.0)
MCH: 32.1 pg (ref 26.0–34.0)
MCHC: 32.7 g/dL (ref 30.0–36.0)
MCV: 98.4 fL (ref 78.0–100.0)
Platelets: 221 10*3/uL (ref 150–400)
RBC: 3.08 MIL/uL — AB (ref 3.87–5.11)
RDW: 14.6 % (ref 11.5–15.5)
WBC: 13.1 10*3/uL — AB (ref 4.0–10.5)

## 2015-05-29 LAB — PROTIME-INR
INR: 1.48 (ref 0.00–1.49)
PROTHROMBIN TIME: 18 s — AB (ref 11.6–15.2)

## 2015-05-29 LAB — HIV ANTIBODY (ROUTINE TESTING W REFLEX): HIV Screen 4th Generation wRfx: NONREACTIVE

## 2015-05-29 LAB — PHOSPHORUS: Phosphorus: 2.5 mg/dL (ref 2.5–4.6)

## 2015-05-29 LAB — TSH: TSH: 2.895 u[IU]/mL (ref 0.350–4.500)

## 2015-05-29 MED ORDER — RIVAROXABAN 20 MG PO TABS: 20.0000 mg | ORAL_TABLET | Freq: Every day | ORAL | Status: DC

## 2015-05-29 MED ORDER — SODIUM CHLORIDE 0.9 % IV SOLN
1500.0000 mg | Freq: Two times a day (BID) | INTRAVENOUS | Status: DC
  Filled 2015-05-29: qty 1500

## 2015-05-29 MED ORDER — CARISOPRODOL 350 MG PO TABS
350.0000 mg | ORAL_TABLET | Freq: Once | ORAL | Status: AC
  Administered 2015-05-29: 350 mg via ORAL
  Filled 2015-05-29: qty 1

## 2015-05-29 MED ORDER — IOHEXOL 300 MG/ML  SOLN
100.0000 mL | Freq: Once | INTRAMUSCULAR | Status: AC | PRN
  Administered 2015-05-29: 100 mL via INTRAVENOUS

## 2015-05-29 MED ORDER — AMIODARONE HCL 200 MG PO TABS
200.0000 mg | ORAL_TABLET | Freq: Two times a day (BID) | ORAL | Status: DC
  Administered 2015-05-29 – 2015-05-30 (×3): 200 mg via ORAL
  Filled 2015-05-29 (×5): qty 1

## 2015-05-29 MED ORDER — RIVAROXABAN 15 MG PO TABS
15.0000 mg | ORAL_TABLET | Freq: Two times a day (BID) | ORAL | Status: DC
  Administered 2015-05-29 – 2015-05-30 (×3): 15 mg via ORAL
  Filled 2015-05-29 (×4): qty 1

## 2015-05-29 NOTE — Progress Notes (Signed)
Pt. Arrived to the unit. Pt. Is alert and oriented with no signs of distress noted. Pt. Vitals appear stable with no skin issues noted. Educated pt. On use of staff numbers, room telephone and call bell. Call light within reach. Orders released. No further needs noted at this time. 

## 2015-05-29 NOTE — Progress Notes (Signed)
Patient ID: Jennifer [name], female   DOB: 08-Apr-1941, 74 y.o.   MRN: [id number]    Referring Physician(s): IM service  Subjective: Pt still with RUQ discomfort but improved slightly; occ nausea; no further hemobilia   Allergies: Tetracycline; Flonase; and Lisinopril  Medications: Prior to Admission medications   Medication Sig Start Date End Date Taking? Authorizing Provider  albuterol (PROAIR HFA) 108 (90 BASE) MCG/ACT inhaler INHALE 2 PUFFS BY MOUTH INTO LUNGS FOUR TIMES DAILY AS NEEDED Patient taking differently: Inhale 1-2 puffs into the lungs every 6 (six) hours as needed for wheezing or shortness of breath.  10/14/14  Yes Elsie Stain, MD  ALPRAZolam Duanne Moron) 0.5 MG tablet Take 1 tablet (0.5 mg total) by mouth 2 (two) times daily as needed for anxiety. 05/02/15  Yes Rushil Sherrye Payor, MD  amitriptyline (ELAVIL) 50 MG tablet Take 1 tablet (50 mg total) by mouth at bedtime. 01/03/15  Yes Bertha Stakes, MD  amoxicillin-clavulanate (AUGMENTIN) 875-125 MG per tablet Take 1 tablet by mouth every 12 (twelve) hours. 05/02/15  Yes Rushil Sherrye Payor, MD  aspirin 81 MG EC tablet Take 81 mg by mouth daily.     Yes Historical Provider, MD  carisoprodol (SOMA) 350 MG tablet Take 1 tablet (350 mg total) by mouth daily as needed for muscle spasms. 01/03/15  Yes Bertha Stakes, MD  Cholecalciferol (VITAMIN D3) 1000 UNITS CAPS Take 1 capsule (1,000 Units total) by mouth daily. 01/23/15  Yes Bertha Stakes, MD  CINNAMON PO Take 1 tablet by mouth 2 (two) times daily.    Yes Historical Provider, MD  dextromethorphan-guaiFENesin (MUCINEX DM) 30-600 MG per 12 hr tablet Take 1 tablet by mouth 2 (two) times daily.   Yes Historical Provider, MD  docusate sodium (COLACE) 100 MG capsule Take 100 mg by mouth daily as needed for mild constipation or moderate constipation.   Yes Historical Provider, MD  Fluticasone-Salmeterol (ADVAIR DISKUS) 250-50 MCG/DOSE AEPB Inhale 1 puff into the lungs 2 (two) times daily. 10/14/14  Yes  Elsie Stain, MD  hydrochlorothiazide (HYDRODIURIL) 25 MG tablet Take 0.5 tablets (12.5 mg total) by mouth daily. 03/07/14  Yes Bertha Stakes, MD  HYDROcodone-acetaminophen (NORCO/VICODIN) 5-325 MG per tablet Take 2 tablets by mouth every 4 (four) hours as needed. Patient taking differently: Take 2 tablets by mouth every 4 (four) hours as needed for moderate pain.  05/25/15  Yes Tanna Furry, MD  hydroxypropyl methylcellulose (ISOPTO TEARS) 2.5 % ophthalmic solution Place 2 drops into both eyes 3 (three) times daily as needed. Dry eye   Yes Historical Provider, MD  ibuprofen (ADVIL,MOTRIN) 200 MG tablet Take 200 mg by mouth every 6 (six) hours as needed for moderate pain. For pain    Yes Historical Provider, MD  meclizine (ANTIVERT) 12.5 MG tablet Take 1 tablet (12.5 mg total) by mouth 2 (two) times daily as needed for dizziness. 11/20/14  Yes Bertha Stakes, MD  Multiple Vitamin (MULTIVITAMIN) tablet Take 1 tablet by mouth daily.   Yes Historical Provider, MD  NIFEdipine (ADALAT CC) 90 MG 24 hr tablet Take 1 tablet (90 mg total) by mouth daily. 09/06/14  Yes Bertha Stakes, MD  omeprazole (PRILOSEC) 20 MG capsule Take 1 capsule (20 mg total) by mouth 2 (two) times daily. 05/06/15  Yes Oval Linsey, MD  ondansetron (ZOFRAN ODT) 4 MG disintegrating tablet Take 1 tablet (4 mg total) by mouth every 8 (eight) hours as needed for nausea. 05/25/15  Yes Tanna Furry, MD  potassium chloride SA (K-DUR,KLOR-CON) 20  MEQ tablet Take 20 mEq by mouth 2 (two) times daily.   Yes Historical Provider, MD  ranitidine (ZANTAC) 150 MG tablet Take 1 tablet (150 mg total) by mouth at bedtime. 03/05/15  Yes Bertha Stakes, MD  Red Yeast Rice Extract (RED YEAST RICE PO) Take 1 tablet by mouth 2 (two) times daily.    Yes Historical Provider, MD  sertraline (ZOLOFT) 100 MG tablet Take 1 tablet (100 mg total) by mouth 2 (two) times daily. 09/06/14  Yes Bertha Stakes, MD  tiotropium (SPIRIVA HANDIHALER) 18 MCG inhalation capsule Place 1  capsule (18 mcg total) into inhaler and inhale daily. 10/14/14  Yes Elsie Stain, MD  topiramate (TOPAMAX) 50 MG tablet Take 1 tablet (50 mg total) by mouth 2 (two) times daily. 04/02/15  Yes Bertha Stakes, MD  triamcinolone cream (KENALOG) 0.1 % APPLY TOPICALLY TO THE AFFECTED AREA OF HAND TWICE DAILY AS NEEDED Patient not taking: Reported on 05/25/2015 03/07/14   Bertha Stakes, MD     Vital Signs: BP 118/76 mmHg  Pulse 82  Temp(Src) 98.1 F (36.7 C) (Oral)  Resp 22  Ht 5\' 3"  (1.6 m)  Wt 228 lb 2.8 oz (103.5 kg)  BMI 40.43 kg/m2  SpO2 94%  Physical Exam pt awake/alert; GB drain intact, cap noted on drain and removed, placed to gravity bag drainage and irrigated gently with 5 cc's sterile NS without difficulty or blood return; output 200 cc's  Imaging: Ct Abdomen Pelvis W Contrast  05/27/2015   CLINICAL DATA:  Status post percutaneous cholecystostomy.  EXAM: CT ABDOMEN AND PELVIS WITH CONTRAST  TECHNIQUE: Multidetector CT imaging of the abdomen and pelvis was performed using the standard protocol following bolus administration of intravenous contrast.  CONTRAST:  100 cc of Omnipaque 300  COMPARISON:  04/25/2015  FINDINGS: Lower chest: Dependent changes are noted within the posterior right lung base.  Hepatobiliary: A percutaneous cholecystostomy tube is identified and appears well suture rated within the gallbladder. The gallbladder appears decompressed. Small fluid collection ventral to the right hepatic lobe measures 2.3 x 3.0 x 4.5 cm, image 37/ series 2. This is mixed attenuation may represent a small biloma and/or hematoma. There is no significant biliary dilatation. 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 The hepatic veins appear patent.  Pancreas: Fatty replacement of the pancreas.  Spleen: Normal appearance of the spleen.  Adrenals/Urinary Tract: Left adrenal gland hypertrophy noted. Right adrenal  gland appears normal. Unremarkable appearance of the kidneys. Urinary bladder appears normal.  Stomach/Bowel: The stomach is within normal limits. The small bowel loops have a normal course and caliber. No obstruction. Normal appearance of the colon.  Vascular/Lymphatic: Calcified atherosclerotic disease involves the abdominal aorta. There is an infrarenal abdominal aortic aneurysm which measures 3.7 cm, image 50/series 2. No enlarged retroperitoneal or mesenteric adenopathy. No enlarged pelvic or inguinal lymph nodes.  Reproductive: Previous hysterectomy.  No adnexal mass.  Other: None.  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  IMPRESSION: 1. Interval percutaneous cholecystostomy with decompression of the gallbladder. There is no evidence for biliary obstruction. 2. Small fluid collection ventral to the right lobe of liver is identified. This is mixed attenuation and may represent a small biloma and/or small hematoma. No large hematoma is identified at this time. 3. 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 4. Aortic atherosclerosis 5. Infrarenal abdominal aortic aneurysm. Recommend followup by ultrasound in 2 years. This recommendation follows ACR consensus guidelines: White Paper of the ACR Incidental Findings Committee II on Vascular Findings. J Am Coll Radiol 2013; 10:789-794. 6. Critical Value/emergent results were called by telephone at the time of interpretation on 05/27/2015 at 7:44 pm to Dr. Carmin Muskrat , who verbally acknowledged these results.   Electronically Signed   By: Kerby Moors M.D.   On: 05/27/2015 19:44   Ir Sinus/fist Tube Chk-non Gi  05/27/2015   CLINICAL DATA:  Hemobilia, status post cholecystostomy, tachycardia, diaphoresis  EXAM: Fluoroscopic injection of the existing cholecystostomy  Date:  6/21/20166/21/2016 3:38 pm  Radiologist:  M. Daryll Brod,  MD  Guidance:  Fluoroscopic  FLUOROSCOPY TIME:  54 seconds, 50 mGy  MEDICATIONS AND MEDICAL HISTORY: None.  ANESTHESIA/SEDATION: None.  CONTRAST:  54mL OMNIPAQUE IOHEXOL 300 MG/ML  SOLN  COMPLICATIONS: None immediate  PROCEDURE: Informed consent was obtained from the patient following explanation of the procedure, risks, benefits and alternatives. The patient understands, agrees and consents for the procedure. All questions were addressed. A time out was performed.  Under sterile conditions, the existing cholecystostomy was injected with contrast under fluoroscopy. Images obtained for documentation.  Cholangiogram: Stable position of the cholecystostomy tube within the gallbladder. Filling defects throughout the gallbladder could represent blood clots and/or stones. Cystic duct is patent. Contrast does reach the common hepatic duct. No communication to be any adjacent vascular structure to account for the hemobilia.  IMPRESSION: Cholecystostomy in good position.  Patent cystic duct.  Gallbladder diffuse filling defect compatible with blood clot versus cholelithiasis.   Electronically Signed   By: Jerilynn Mages.  Shick M.D.   On: 05/27/2015 16:26   Dg Chest Port 1 View  05/27/2015   CLINICAL DATA:  Abdominal pain and tachycardia.  EXAM: PORTABLE CHEST - 1 VIEW  COMPARISON:  05/25/2015  FINDINGS: Portable view of the chest demonstrates low lung volumes. Difficult to exclude vascular congestion. Heart size is accentuated by the low lung volumes. Negative for pneumothorax.  IMPRESSION: Low lung volumes without focal disease. Difficult to exclude vascular congestion.   Electronically Signed   By: Markus Daft M.D.   On: 05/27/2015 17:54   Ir Cholangiogram Existing Tube  05/27/2015   CLINICAL DATA:  Hemobilia. Examination of the drainage to for the cholecystostomy tube demonstrates predominately serosanguineous fluid and a few scattered very dark gold blood clots. There is no bright red blood in the drainage bag.  EXAM:  CHOLANGIOGRAM VIA EXISTING CATHETER  PROCEDURE: Contrast was injected into the cholecystostomy tube and imaging was obtained.  FINDINGS: There are filling defects in the gallbladder and cystic duct compatible with thrombus. The cystic duct is patent.  IMPRESSION: The cystic duct is patent. Blood clots are present in the gallbladder. Recommendations are for drain capping and resuming bag drainage tomorrow.   Electronically Signed   By: Marybelle Killings M.D.   On: 05/27/2015 07:51    Labs:  CBC:  Recent Labs  05/25/15 1006 05/27/15 1600 05/28/15 0235 05/29/15 0530  WBC 8.3 15.5* 14.6* 13.1*  HGB 12.2 11.3* 10.6* 9.5*  HCT 36.7 33.4* 31.4* 28.7*  PLT 210 236 207 210    COAGS:  Recent Labs  04/26/15 1110  INR 1.63*  APTT 42*    BMP:  Recent Labs  05/27/15 1600 05/28/15 0235 05/28/15 1627 05/29/15 0530  NA 132* 132* 134* 134*  K 2.8* 2.9* 3.0* 3.2*  CL  97* 102 104 104  CO2 23 23 23 24   GLUCOSE 119* 122* 134* 129*  BUN 13 6 8  5*  CALCIUM 8.9 8.2* 8.3* 8.6*  CREATININE 0.68 0.59 0.55 0.57  GFRNONAA >60 >60 >60 >60  GFRAA >60 >60 >60 >60    LIVER FUNCTION TESTS:  Recent Labs  05/25/15 1006 05/27/15 1600 05/28/15 0235 05/29/15 0530  BILITOT 1.6* 3.1* 2.4* 1.2  AST 1065* 143* 68* 26  ALT 493* 523* 334* 195*  ALKPHOS 240* 249* 228* 195*  PROT 7.0 6.4* 5.2* 5.5*  ALBUMIN 3.0* 2.5* 2.1* 1.9*    Assessment and Plan:  S/p perc GB drain 04/26/15; recent hemobilia but drain inj revealed good placement; 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  Signed: D. Rowe Robert 05/29/2015, 11:12 AM   I spent a total of 15 minutes  in face to face in clinical consultation/evaluation, greater than 50% of which was counseling/coordinating care for GB drain

## 2015-05-29 NOTE — Progress Notes (Signed)
SUBJECTIVE:  Denies chest pain or SOB  OBJECTIVE:   Vitals:   Filed Vitals:   05/29/15 0246 05/29/15 0250 05/29/15 0800 05/29/15 0938  BP:  118/76    Pulse:  82    Temp: 98.3 F (36.8 C)  98.1 F (36.7 C)   TempSrc: Oral  Oral   Resp:  22    Height:      Weight:      SpO2:  97%  94%   I&O's:   Intake/Output Summary (Last 24 hours) at 05/29/15 1056 Last data filed at 05/29/15 0630  Gross per 24 hour  Intake 2902.07 ml  Output    455 ml  Net 2447.07 ml   TELEMETRY: Reviewed telemetry pt in NSR:     PHYSICAL EXAM General: Well developed, well nourished, in no acute distress Head: Eyes PERRLA, No xanthomas.   Normal cephalic and atramatic  Lungs:   Clear bilaterally to auscultation and percussion. Heart:   HRRR S1 S2 Pulses are 2+ & equal. Abdomen: Bowel sounds are positive, abdomen soft and non-tender without masses  Extremities:   No clubbing, cyanosis or edema.  DP +1 Neuro: Alert and oriented X 3. Psych:  Good affect, responds appropriately   LABS: Basic Metabolic Panel:  Recent Labs  05/27/15 1600  05/28/15 1627 05/29/15 0530  NA 132*  < > 134* 134*  K 2.8*  < > 3.0* 3.2*  CL 97*  < > 104 104  CO2 23  < > 23 24  GLUCOSE 119*  < > 134* 129*  BUN 13  < > 8 5*  CREATININE 0.68  < > 0.55 0.57  CALCIUM 8.9  < > 8.3* 8.6*  MG 1.8  --  1.7  --   PHOS  --   --   --  2.5  < > = values in this interval not displayed. Liver Function Tests:  Recent Labs  05/28/15 0235 05/29/15 0530  AST 68* 26  ALT 334* 195*  ALKPHOS 228* 195*  BILITOT 2.4* 1.2  PROT 5.2* 5.5*  ALBUMIN 2.1* 1.9*    Recent Labs  05/27/15 1600  LIPASE 11*   CBC:  Recent Labs  05/27/15 1600 05/28/15 0235 05/29/15 0530  WBC 15.5* 14.6* 13.1*  NEUTROABS 12.1*  --  10.2*  HGB 11.3* 10.6* 9.5*  HCT 33.4* 31.4* 28.7*  MCV 96.5 95.7 97.6  PLT 236 207 210   Cardiac Enzymes: No results for input(s): CKTOTAL, CKMB, CKMBINDEX, TROPONINI in the last 72 hours. BNP: Invalid  input(s): POCBNP D-Dimer: No results for input(s): DDIMER in the last 72 hours. Hemoglobin A1C: No results for input(s): HGBA1C in the last 72 hours. Fasting Lipid Panel: No results for input(s): CHOL, HDL, LDLCALC, TRIG, CHOLHDL, LDLDIRECT in the last 72 hours. Thyroid Function Tests: No results for input(s): TSH, T4TOTAL, T3FREE, THYROIDAB in the last 72 hours.  Invalid input(s): FREET3 Anemia Panel: No results for input(s): VITAMINB12, FOLATE, FERRITIN, TIBC, IRON, RETICCTPCT in the last 72 hours. Coag Panel:   Lab Results  Component Value Date   INR 1.63* 04/26/2015   INR 1.0 07/07/2009   INR 1.1 05/09/2007    RADIOLOGY: Ct Abdomen Pelvis W Contrast  05/27/2015   CLINICAL DATA:  Status post percutaneous cholecystostomy.  EXAM: CT ABDOMEN AND PELVIS WITH CONTRAST  TECHNIQUE: Multidetector CT imaging of the abdomen and pelvis was performed using the standard protocol following bolus administration of intravenous contrast.  CONTRAST:  100 cc of Omnipaque 300  COMPARISON:  04/25/2015  FINDINGS: Lower chest: Dependent changes are noted within the posterior right lung base.  Hepatobiliary: A percutaneous cholecystostomy tube is identified and appears well suture rated within the gallbladder. The gallbladder appears decompressed. Small fluid collection ventral to the right hepatic lobe measures 2.3 x 3.0 x 4.5 cm, image 37/ series 2. This is mixed attenuation may represent a small biloma and/or hematoma. There is no significant biliary dilatation. 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 The hepatic veins appear patent.  Pancreas: Fatty replacement of the pancreas.  Spleen: Normal appearance of the spleen.  Adrenals/Urinary Tract: Left adrenal gland hypertrophy noted. Right adrenal gland appears normal. Unremarkable appearance of the kidneys. Urinary bladder appears normal.  Stomach/Bowel: The stomach is  within normal limits. The small bowel loops have a normal course and caliber. No obstruction. Normal appearance of the colon.  Vascular/Lymphatic: Calcified atherosclerotic disease involves the abdominal aorta. There is an infrarenal abdominal aortic aneurysm which measures 3.7 cm, image 50/series 2. No enlarged retroperitoneal or mesenteric adenopathy. No enlarged pelvic or inguinal lymph nodes.  Reproductive: Previous hysterectomy.  No adnexal mass.  Other: None.  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  IMPRESSION: 1. Interval percutaneous cholecystostomy with decompression of the gallbladder. There is no evidence for biliary obstruction. 2. Small fluid collection ventral to the right lobe of liver is identified. This is mixed attenuation and may represent a small biloma and/or small hematoma. No large hematoma is identified at this time. 3. 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 4. Aortic atherosclerosis 5. Infrarenal abdominal aortic aneurysm. Recommend followup by ultrasound in 2 years. This recommendation follows ACR consensus guidelines: White Paper of the ACR Incidental Findings Committee II on Vascular Findings. J Am Coll Radiol 2013; 10:789-794. 6. Critical Value/emergent results were called by telephone at the time of interpretation on 05/27/2015 at 7:44 pm to Dr. Carmin Muskrat , who verbally acknowledged these results.   Electronically Signed   By: Kerby Moors M.D.   On: 05/27/2015 19:44   Ir Sinus/fist Tube Chk-non Gi  05/27/2015   CLINICAL DATA:  Hemobilia, status post cholecystostomy, tachycardia, diaphoresis  EXAM: Fluoroscopic injection of the existing cholecystostomy  Date:  6/21/20166/21/2016 3:38 pm  Radiologist:  M. Daryll Brod, MD  Guidance:  Fluoroscopic  FLUOROSCOPY TIME:  54 seconds, 50 mGy  MEDICATIONS AND MEDICAL HISTORY: None.   ANESTHESIA/SEDATION: None.  CONTRAST:  86mL OMNIPAQUE IOHEXOL 300 MG/ML  SOLN  COMPLICATIONS: None immediate  PROCEDURE: Informed consent was obtained from the patient following explanation of the procedure, risks, benefits and alternatives. The patient understands, agrees and consents for the procedure. All questions were addressed. A time out was performed.  Under sterile conditions, the existing cholecystostomy was injected with contrast under fluoroscopy. Images obtained for documentation.  Cholangiogram: Stable position of the cholecystostomy tube within the gallbladder. Filling defects throughout the gallbladder could represent blood clots and/or stones. Cystic duct is patent. Contrast does reach the common hepatic duct. No communication to be any adjacent vascular structure to account for the hemobilia.  IMPRESSION: Cholecystostomy in good position.  Patent cystic duct.  Gallbladder diffuse filling defect compatible with blood clot versus cholelithiasis.   Electronically Signed   By: Jerilynn Mages.  Shick M.D.   On: 05/27/2015 16:26   Dg Chest Port 1  View  05/27/2015   CLINICAL DATA:  Abdominal pain and tachycardia.  EXAM: PORTABLE CHEST - 1 VIEW  COMPARISON:  05/25/2015  FINDINGS: Portable view of the chest demonstrates low lung volumes. Difficult to exclude vascular congestion. Heart size is accentuated by the low lung volumes. Negative for pneumothorax.  IMPRESSION: Low lung volumes without focal disease. Difficult to exclude vascular congestion.   Electronically Signed   By: Markus Daft M.D.   On: 05/27/2015 17:54   Dg Chest Port 1 View  05/25/2015   CLINICAL DATA:  Acute chest pain.  EXAM: PORTABLE CHEST - 1 VIEW  COMPARISON:  Apr 25, 2015.  FINDINGS: Stable cardiomediastinal silhouette. No pneumothorax or pleural effusion is noted. Both lungs are clear. The visualized skeletal structures are unremarkable.  IMPRESSION: No acute cardiopulmonary abnormality seen.   Electronically Signed   By: Marijo Conception,  M.D.   On: 05/25/2015 10:20   Ir Cholangiogram Existing Tube  05/27/2015   CLINICAL DATA:  Hemobilia. Examination of the drainage to for the cholecystostomy tube demonstrates predominately serosanguineous fluid and a few scattered very dark gold blood clots. There is no bright red blood in the drainage bag.  EXAM: CHOLANGIOGRAM VIA EXISTING CATHETER  PROCEDURE: Contrast was injected into the cholecystostomy tube and imaging was obtained.  FINDINGS: There are filling defects in the gallbladder and cystic duct compatible with thrombus. The cystic duct is patent.  IMPRESSION: The cystic duct is patent. Blood clots are present in the gallbladder. Recommendations are for drain capping and resuming bag drainage tomorrow.   Electronically Signed   By: Marybelle Killings M.D.   On: 05/27/2015 07:51    ASSESSMENT/PLAN: 1. Atrial fibrillation with RVR - now converted to NSR in the 80's. Change Amio to 200mg  BID. 2D echo 04/2015 with normal LVF and mild LAE. Keep K+>4.  Check TSH.  LFTs elevated but trending down and AST is now normal.  Will need to follow closely while loading Amio.  Will need PFTs with DLCO prior to discharge for baseline.  No history of PAF in the past so this may be due to acute illness and may not have to be on Amio long term. 2. Hypokalemia - replete per IM 3. HTN - controlled 4. O2 dependent COPD 5. Abdominal pain with bloody drainage from cholecystotomy tube and Acute Portal Vein Thrombosis - Now on full dose Lovenox.  Agree with starting warfarin per pharmacy 6. Sepsis most likely from abdomen with recent acute cholecystitis   Sueanne Margarita, MD  05/29/2015  10:56 AM

## 2015-05-29 NOTE — Evaluation (Signed)
Physical Therapy Evaluation Patient Details Name: Jennifer [name] MRN: [id number] DOB: July 20, 1941 Today's Date: 05/29/2015   History of Present Illness  pt is a 74 y/o female recently discharged with cholesistotomy drain to SNF.  Now returns with bleeding  from drain and abdominal pain.  Clinical Impression  Pt is appropriate for PT rehab at SNF to get her ready to d/c home to family's assist.  Presently, pt is fairly deconditioned and weak.  Pt signoff from acute PT in anticipation of pt's discharge back to SNF today.     Follow Up Recommendations SNF    Equipment Recommendations  None recommended by PT    Recommendations for Other Services       Precautions / Restrictions Precautions Precautions: Fall      Mobility  Bed Mobility Overal bed mobility: Needs Assistance Bed Mobility: Supine to Sit;Sit to Supine     Supine to sit: Min assist (rail) Sit to supine: Min assist   General bed mobility comments: Min to reposition  Transfers Overall transfer level: Needs assistance Equipment used: 1 person hand held assist Transfers: Sit to/from Stand Sit to Stand: Min assist         General transfer comment: stability assist  Ambulation/Gait Ambulation/Gait assistance: Min assist;Mod assist Ambulation Distance (Feet): 25 Feet Assistive device: 1 person hand held assist Gait Pattern/deviations: Step-through pattern     General Gait Details: unsteady throughout , but worsening with fatigue  Stairs            Wheelchair Mobility    Modified Rankin (Stroke Patients Only)       Balance Overall balance assessment: Needs assistance Sitting-balance support: No upper extremity supported Sitting balance-Leahy Scale: Fair     Standing balance support: Single extremity supported;Bilateral upper extremity supported Standing balance-Leahy Scale: Poor                               Pertinent Vitals/Pain Pain Assessment: Faces Faces Pain Scale: Hurts  little more Pain Location: tube site and abdomen Pain Descriptors / Indicators: Grimacing;Discomfort Pain Intervention(s): Monitored during session    Home Living Family/patient expects to be discharged to:: Skilled nursing facility                      Prior Function Level of Independence: Independent with assistive device(s)         Comments: pt used cane all the time due to bad knees.  one fall in last 3 months - she tripped over something     Hand Dominance        Extremity/Trunk Assessment   Upper Extremity Assessment: Overall WFL for tasks assessed;Generalized weakness           Lower Extremity Assessment: Generalized weakness         Communication   Communication: No difficulties  Cognition Arousal/Alertness: Awake/alert Behavior During Therapy: WFL for tasks assessed/performed Overall Cognitive Status: Within Functional Limits for tasks assessed                      General Comments General comments (skin integrity, edema, etc.): Notable trunk and LE weakness.    Exercises        Assessment/Plan    PT Assessment All further PT needs can be met in the next venue of care  PT Diagnosis Difficulty walking;Generalized weakness   PT Problem List Decreased strength;Decreased activity tolerance;Decreased balance;Decreased mobility;Decreased knowledge of  use of DME;Pain  PT Treatment Interventions     PT Goals (Current goals can be found in the Care Plan section) Acute Rehab PT Goals Patient Stated Goal: Get back to my family PT Goal Formulation: All assessment and education complete, DC therapy    Frequency     Barriers to discharge        Co-evaluation               End of Session   Activity Tolerance: Patient tolerated treatment well;Patient limited by fatigue Patient left: in bed;with call bell/phone within reach Nurse Communication: Mobility status         Time: 1200-1218 PT Time Calculation (min) (ACUTE ONLY):  18 min   Charges:   PT Evaluation $Initial PT Evaluation Tier I: 1 Procedure     PT G Codes:        Jennifer [name], Jennifer [name] 05/29/2015, 12:26 PM

## 2015-05-29 NOTE — Progress Notes (Addendum)
Subjective: Continues to have abdominal pain RMQ and LLQ. Nausea is better. No SOB or chills. No other complaints currently. No longer having any bleeding at the cholecystostomy bag. In NSR currently on amio drip. Vitals normal.   Objective: Vital signs in last 24 hours: Filed Vitals:   05/28/15 2059 05/28/15 2324 05/29/15 0246 05/29/15 0250  BP:  111/84  118/76  Pulse:  78  82  Temp:  97.1 F (36.2 C) 98.3 F (36.8 C)   TempSrc:  Axillary Oral   Resp:  16  22  Height:      Weight:      SpO2: 100% 94%  97%   Weight change:   Intake/Output Summary (Last 24 hours) at 05/29/15 0828 Last data filed at 05/29/15 0630  Gross per 24 hour  Intake 2902.07 ml  Output    455 ml  Net 2447.07 ml   Vitals reviewed. General: resting in bed, NAD HEENT: PERRL, EOMI, no scleral icterus Cardiac: RRR, no rubs, murmurs or gallops Pulm: clear to auscultation bilaterally on anterior field, no wheezes, rales, or rhonchi Abd: soft, cholecystostomy tube site has some dry blood but no active bleeding. Has tenderness around this area. Also has tenderness diffusely on other areas of the abdomen Ext: warm and well perfused, no pedal edema Neuro: alert and oriented X3, cranial nerves II-XII grossly intact, strength and sensation to light touch equal in bilateral upper and lower extremities  Lab Results: Basic Metabolic Panel:  Recent Labs Lab 05/27/15 1600  05/28/15 1627 05/29/15 0530  NA 132*  < > 134* 134*  K 2.8*  < > 3.0* 3.2*  CL 97*  < > 104 104  CO2 23  < > 23 24  GLUCOSE 119*  < > 134* 129*  BUN 13  < > 8 5*  CREATININE 0.68  < > 0.55 0.57  CALCIUM 8.9  < > 8.3* 8.6*  MG 1.8  --  1.7  --   PHOS  --   --   --  2.5  < > = values in this interval not displayed. Liver Function Tests:  Recent Labs Lab 05/28/15 0235 05/29/15 0530  AST 68* 26  ALT 334* 195*  ALKPHOS 228* 195*  BILITOT 2.4* 1.2  PROT 5.2* 5.5*  ALBUMIN 2.1* 1.9*    Recent Labs Lab 05/25/15 1006  05/27/15 1600  LIPASE 14* 11*   No results for input(s): AMMONIA in the last 168 hours. CBC:  Recent Labs Lab 05/27/15 1600 05/28/15 0235 05/29/15 0530  WBC 15.5* 14.6* 13.1*  NEUTROABS 12.1*  --  10.2*  HGB 11.3* 10.6* 9.5*  HCT 33.4* 31.4* 28.7*  MCV 96.5 95.7 97.6  PLT 236 207 210   Cardiac Enzymes:  Recent Labs Lab 05/25/15 1006  TROPONINI <0.03  Urine Drug Screen: Drugs of Abuse  No results found for: LABOPIA, COCAINSCRNUR, LABBENZ, AMPHETMU, THCU, LABBARB  Alcohol Level: No results for input(s): ETH in the last 168 hours. Urinalysis:  Recent Labs Lab 05/27/15 1643  COLORURINE AMBER*  LABSPEC 1.016  PHURINE 5.5  GLUCOSEU NEGATIVE  HGBUR NEGATIVE  BILIRUBINUR MODERATE*  KETONESUR NEGATIVE  PROTEINUR NEGATIVE  UROBILINOGEN 0.2  NITRITE NEGATIVE  LEUKOCYTESUR SMALL*   Misc. Labs: Micro Results: Recent Results (from the past 240 hour(s))  MRSA PCR Screening     Status: Abnormal   Collection Time: 05/27/15 12:59 AM  Result Value Ref Range Status   MRSA by PCR POSITIVE (A) NEGATIVE Final    Comment:  The GeneXpert MRSA Assay (FDA approved for NASAL specimens only), is one component of a comprehensive MRSA colonization surveillance program. It is not intended to diagnose MRSA infection nor to guide or monitor treatment for MRSA infections. RESULT CALLED TO, READ BACK BY AND VERIFIED WITH: W.WORK RN [phone] 05/28/15 E.GADDY   Blood Culture (routine x 2)     Status: None (Preliminary result)   Collection Time: 05/27/15  5:55 PM  Result Value Ref Range Status   Specimen Description BLOOD RIGHT HAND  Final   Special Requests BOTTLES DRAWN AEROBIC AND ANAEROBIC 5CC  Final   Culture NO GROWTH < 24 HOURS  Final   Report Status PENDING  Incomplete  Blood Culture (routine x 2)     Status: None (Preliminary result)   Collection Time: 05/27/15  5:58 PM  Result Value Ref Range Status   Specimen Description BLOOD LEFT HAND  Final   Special Requests  BOTTLES DRAWN AEROBIC ONLY 2CC  Final   Culture NO GROWTH < 24 HOURS  Final   Report Status PENDING  Incomplete   Studies/Results: Ct Abdomen Pelvis W Contrast  05/27/2015   CLINICAL DATA:  Status post percutaneous cholecystostomy.  EXAM: CT ABDOMEN AND PELVIS WITH CONTRAST  TECHNIQUE: Multidetector CT imaging of the abdomen and pelvis was performed using the standard protocol following bolus administration of intravenous contrast.  CONTRAST:  100 cc of Omnipaque 300  COMPARISON:  04/25/2015  FINDINGS: Lower chest: Dependent changes are noted within the posterior right lung base.  Hepatobiliary: A percutaneous cholecystostomy tube is identified and appears well suture rated within the gallbladder. The gallbladder appears decompressed. Small fluid collection ventral to the right hepatic lobe measures 2.3 x 3.0 x 4.5 cm, image 37/ series 2. This is mixed attenuation may represent a small biloma and/or hematoma. There is no significant biliary dilatation. 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 The hepatic veins appear patent.  Pancreas: Fatty replacement of the pancreas.  Spleen: Normal appearance of the spleen.  Adrenals/Urinary Tract: Left adrenal gland hypertrophy noted. Right adrenal gland appears normal. Unremarkable appearance of the kidneys. Urinary bladder appears normal.  Stomach/Bowel: The stomach is within normal limits. The small bowel loops have a normal course and caliber. No obstruction. Normal appearance of the colon.  Vascular/Lymphatic: Calcified atherosclerotic disease involves the abdominal aorta. There is an infrarenal abdominal aortic aneurysm which measures 3.7 cm, image 50/series 2. No enlarged retroperitoneal or mesenteric adenopathy. No enlarged pelvic or inguinal lymph nodes.  Reproductive: Previous hysterectomy.  No adnexal mass.  Other: None.  Musculoskeletal: Degenerative disc disease noted  within the lower thoracic and lumbar spine.  IMPRESSION: 1. Interval percutaneous cholecystostomy with decompression of the gallbladder. There is no evidence for biliary obstruction. 2. Small fluid collection ventral to the right lobe of liver is identified. This is mixed attenuation and may represent a small biloma and/or small hematoma. No large hematoma is identified at this time. 3. 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 4. Aortic atherosclerosis 5. Infrarenal abdominal aortic aneurysm. Recommend followup by ultrasound in 2 years. This recommendation follows ACR consensus guidelines: White Paper of the ACR Incidental Findings Committee II on Vascular Findings. J Am Coll Radiol 2013; 10:789-794. 6. Critical Value/emergent results were called by telephone at the time of interpretation on 05/27/2015 at 7:44 pm to  Dr. Carmin Muskrat , who verbally acknowledged these results.   Electronically Signed   By: Kerby Moors M.D.   On: 05/27/2015 19:44   Ir Sinus/fist Tube Chk-non Gi  05/27/2015   CLINICAL DATA:  Hemobilia, status post cholecystostomy, tachycardia, diaphoresis  EXAM: Fluoroscopic injection of the existing cholecystostomy  Date:  6/21/20166/21/2016 3:38 pm  Radiologist:  M. Daryll Brod, MD  Guidance:  Fluoroscopic  FLUOROSCOPY TIME:  54 seconds, 50 mGy  MEDICATIONS AND MEDICAL HISTORY: None.  ANESTHESIA/SEDATION: None.  CONTRAST:  74mL OMNIPAQUE IOHEXOL 300 MG/ML  SOLN  COMPLICATIONS: None immediate  PROCEDURE: Informed consent was obtained from the patient following explanation of the procedure, risks, benefits and alternatives. The patient understands, agrees and consents for the procedure. All questions were addressed. A time out was performed.  Under sterile conditions, the existing cholecystostomy was injected with contrast under fluoroscopy. Images obtained for documentation.  Cholangiogram: Stable position of  the cholecystostomy tube within the gallbladder. Filling defects throughout the gallbladder could represent blood clots and/or stones. Cystic duct is patent. Contrast does reach the common hepatic duct. No communication to be any adjacent vascular structure to account for the hemobilia.  IMPRESSION: Cholecystostomy in good position.  Patent cystic duct.  Gallbladder diffuse filling defect compatible with blood clot versus cholelithiasis.   Electronically Signed   By: Jerilynn Mages.  Shick M.D.   On: 05/27/2015 16:26   Dg Chest Port 1 View  05/27/2015   CLINICAL DATA:  Abdominal pain and tachycardia.  EXAM: PORTABLE CHEST - 1 VIEW  COMPARISON:  05/25/2015  FINDINGS: Portable view of the chest demonstrates low lung volumes. Difficult to exclude vascular congestion. Heart size is accentuated by the low lung volumes. Negative for pneumothorax.  IMPRESSION: Low lung volumes without focal disease. Difficult to exclude vascular congestion.   Electronically Signed   By: Markus Daft M.D.   On: 05/27/2015 17:54   Medications: I have reviewed the patient's current medications. Scheduled Meds: . antiseptic oral rinse  7 mL Mouth Rinse q12n4p  . chlorhexidine  15 mL Mouth Rinse BID  . Chlorhexidine Gluconate Cloth  6 each Topical Q0600  . enoxaparin (LOVENOX) injection  100 mg Subcutaneous Q12H  . mometasone-formoterol  2 puff Inhalation BID  . mupirocin ointment  1 application Nasal BID  . piperacillin-tazobactam (ZOSYN)  IV  3.375 g Intravenous 3 times per day  . potassium chloride  40 mEq Oral BID  . sertraline  100 mg Oral BID  . tiotropium  18 mcg Inhalation Daily  . topiramate  50 mg Oral BID  . vancomycin  1,250 mg Intravenous Q12H   Continuous Infusions: . amiodarone 30 mg/hr (05/29/15 0100)   PRN Meds:.albuterol, ALPRAZolam, HYDROcodone-acetaminophen Assessment/Plan: Active Problems:   Essential hypertension   SVT (supraventricular tachycardia)   Atrial fibrillation with RVR   Portal vein  thrombosis  Acute/recent Portal vein thrombosis -in the setting of cholecystostomy placement on 04/2015 for acute choelcystitis Presented with abdominal pain, on going bleeding seen at cholecystostomy site for last few days. CT abdomen showing portal vein thrombosis seen  on lateral and medial segment of left lobe. Likely 2/2 to recent procedure of cholecystostomy insertion but could be other causes as well Initially had significant LFT elevation on 6/19 when presented to the ED. Then on admission she still has LFT's but down from 6/19. LFTs are trending down further. Leukocytosis also trending down, remains afebrile. and tachycardia (initially was in SVT then to Afib then now NSR after amio) likely 2/2 to  this but improving. - appreciate IR recs: start anticoag as not actively bleeding. If bleeding recurs, may need GB drain tract injection/imaging -GI consulted for recs about why this patient would have portal vein thrombosis. No evidence of malignancy, pancreatitis, or cirrhosis. They also think it's related to the GB surgery. - GI Recommended AFP, hypercoag panel outpatient, and Doppler u/s in 2-3 weeks to further evaluate her hepatic/portal flow. - will switch off lovenox and start Xarelto today after INR is collected and Child Pugh's score is calculated. - tube has to stay for 6 weeks. Was placed on 04/26/15. Has appt to f/up with surgery Dr. Hulen Skains on 06/03/15.   Sepsis criteria - no clear source of infection. No resp symptoms, no dysuria, does have ab tenderness likely from the portal vein thrombosis.  BP stable. Afebrile now. On vanc+zosyn currently.  Tachycardia and white count can also be 2/2 to Portal vein thrombosis.  - follow up bcx - negative for 36+hours now, will d/c vanc+zosyn  Afib with RVR - CHADS2 vasc 3 (htn, age, chf) likely 2/2 to sepsis or Acute Portal vein thrombosis (was initially in SVT, s/p adenosine), now on amio drip - in NSR now.  - appreciate card recs. - will switch  to PO amio  - needs to be anticoagulated. On lovenox now, will do Xarelto  - replete K+ and Mag as needed.   Hypokalemia and hypomag- replete as needed  HTN - at home on HCTZ 12.5mg  daily and nifedipine 90mg  daily.  - hold for now, resume home meds if BP goes up.  CHF - last TTE 04/2015 w/ EF 55-60% with grade 1 diastolic dysfunction - euvolemic now. Monitor.  COPD - o2 depended - on advair, spiriva, and albuterol daily ath ome - cont home meds  OSA - cont cpap qhs  Anxiety and depression - cont home zoloft and xanax  migraine - cont home topamax    Dispo: Disposition is deferred at this time, awaiting improvement of current medical problems.  Anticipated discharge in approximately 1-2 day(s).   The patient does have a current PCP Bertha Stakes, MD) and does need an [facility] hospital follow-up appointment after discharge.  The patient does have transportation limitations that hinder transportation to clinic appointments.  .Services Needed at time of discharge: Y = Yes, Blank = No PT:   OT:   RN:   Equipment:   Other:     LOS: 2 days   Dellia Nims, MD 05/29/2015, 8:28 AM

## 2015-05-29 NOTE — Progress Notes (Signed)
ANTIBIOTIC CONSULT NOTE - FOLLOW UP  Pharmacy Consult:  Vancomycin / Zosyn Indication:  Rule out sepsis  Allergies  Allergen Reactions  . Tetracycline Other (See Comments)    REACTION: stomach upset  . Flonase [Fluticasone] Cough  . Lisinopril Cough    Patient Measurements: Height: 5\' 3"  (160 cm) Weight: 228 lb 2.8 oz (103.5 kg) IBW/kg (Calculated) : 52.4  Vital Signs: Temp: 98.1 F (36.7 C) (06/23 0800) Temp Source: Oral (06/23 0800) BP: 118/76 mmHg (06/23 0250) Pulse Rate: 82 (06/23 0250) Intake/Output from previous day: 06/22 0701 - 06/23 0700 In: 2902.1 [P.O.:720; I.V.:1882.1; IV Piggyback:300] Out: 455 [Urine:250; Drains:205]  Labs:  Recent Labs  05/27/15 1600 05/28/15 0235 05/28/15 1627 05/29/15 0530  WBC 15.5* 14.6*  --  13.1*  HGB 11.3* 10.6*  --  9.5*  PLT 236 207  --  210  CREATININE 0.68 0.59 0.55 0.57   Estimated Creatinine Clearance: 68.8 mL/min (by C-G formula based on Cr of 0.57).  Recent Labs  05/29/15 0530  [place] [address]     Microbiology: Recent Results (from the past 720 hour(s))  MRSA PCR Screening     Status: Abnormal   Collection Time: 05/27/15 12:59 AM  Result Value Ref Range Status   MRSA by PCR POSITIVE (A) NEGATIVE Final    Comment:        The GeneXpert MRSA Assay (FDA approved for NASAL specimens only), is one component of a comprehensive MRSA colonization surveillance program. It is not intended to diagnose MRSA infection nor to guide or monitor treatment for MRSA infections. RESULT CALLED TO, READ BACK BY AND VERIFIED WITH: W.WORK RN [phone] 05/28/15 E.GADDY   Blood Culture (routine x 2)     Status: None (Preliminary result)   Collection Time: 05/27/15  5:55 PM  Result Value Ref Range Status   Specimen Description BLOOD RIGHT HAND  Final   Special Requests BOTTLES DRAWN AEROBIC AND ANAEROBIC 5CC  Final   Culture NO GROWTH < 24 HOURS  Final   Report Status PENDING  Incomplete  Blood Culture (routine x 2)     Status:  None (Preliminary result)   Collection Time: 05/27/15  5:58 PM  Result Value Ref Range Status   Specimen Description BLOOD LEFT HAND  Final   Special Requests BOTTLES DRAWN AEROBIC ONLY 2CC  Final   Culture NO GROWTH < 24 HOURS  Final   Report Status PENDING  Incomplete      Assessment: 44 YOF continues on vancomycin and Zosyn for rule out sepsis.  Patient's renal function has been stable.  Vancomycin trough is slightly sub-therapeutic.  Noted plan to discontinue antibiotics if blood cultures remain negative.  Vanc 6/21 >> Zosyn 6/21 >>  6/23 VT = 13 mcg/mL on 1250mg  q12 >> 1500mg  q12  6/21 MRSA - positive 6/21 BCx x2 - NGTD   Goal of Therapy:  Anti-Xa level 0.6-1 units/ml 4hrs after LMWH dose given Monitor platelets by anticoagulation protocol: Yes  Vanc trough 15-20 mcg/mL   Plan:  - Change vanc to 1500mg  IV Q12H - Zosyn 3.375g Q8H, 4 hr infusion - Monitor renal fxn, clinical progress, abx LOT - Lovenox 100mg  SQ Q12H - CBC Q72H while on Lovenox - Monitor closely for s/sx of bleeding   Corinn Stoltzfus D. Mina Marble, PharmD, BCPS Pager:  [phone] 05/29/2015, 10:28 AM

## 2015-05-29 NOTE — Clinical Social Work Note (Signed)
CSW faxed over the pt's clinicals, so [employer] can start insurance authorization. CSW is awaiting to hear back from [place] regarding insurance authorization.   Hardy, MSW, [place]

## 2015-05-29 NOTE — Discharge Instructions (Signed)
Information on my medicine - XARELTO (rivaroxaban)  This medication education was reviewed with me or my healthcare representative as part of my discharge preparation.  The pharmacist that spoke with me during my hospital stay was:  Saundra Shelling, [place]? Xarelto was prescribed to treat blood clots that may have been found in the veins of your legs (deep vein thrombosis) or in your lungs (pulmonary embolism) and to reduce the risk of them occurring again.  What do you need to know about Xarelto? The starting dose is one 15 mg tablet taken TWICE daily with food for the FIRST 21 DAYS then on (enter date)  06/19/15  the dose is changed to one 20 mg tablet taken ONCE A DAY with your evening meal.  DO NOT stop taking Xarelto without talking to the health care provider who prescribed the medication.  Refill your prescription for 20 mg tablets before you run out.  After discharge, you should have regular check-up appointments with your healthcare provider that is prescribing your Xarelto.  In the future your dose may need to be changed if your kidney function changes by a significant amount.  What do you do if you miss a dose? If you are taking Xarelto TWICE DAILY and you miss a dose, take it as soon as you remember. You may take two 15 mg tablets (total 30 mg) at the same time then resume your regularly scheduled 15 mg twice daily the next day.  If you are taking Xarelto ONCE DAILY and you miss a dose, take it as soon as you remember on the same day then continue your regularly scheduled once daily regimen the next day. Do not take two doses of Xarelto at the same time.   Important Safety Information Xarelto is a blood thinner medicine that can cause bleeding. You should call your healthcare provider right away if you experience any of the following: ? Bleeding from an injury or your nose that does not stop. ? Unusual colored urine (red or dark brown) or  unusual colored stools (red or black). ? Unusual bruising for unknown reasons. ? A serious fall or if you hit your head (even if there is no bleeding).  Some medicines may interact with Xarelto and might increase your risk of bleeding while on Xarelto. To help avoid this, consult your healthcare provider or pharmacist prior to using any new prescription or non-prescription medications, including herbals, vitamins, non-steroidal anti-inflammatory drugs (NSAIDs) and supplements.  This website has more information on Xarelto: [email or web address]

## 2015-05-29 NOTE — Progress Notes (Signed)
Dear Doctor Ellwood Dense: This patient has been identified as a candidate for PICC for the following reason (s): IV therapy over 48 hours and restarts due to phlebitis and infiltration in 24 hours If you agree, please write an order for the indicated device. For any questions contact the Vascular Access Team at [phone] if no answer, please leave a message.  Thank you for supporting the early vascular access assessment program.

## 2015-05-29 NOTE — Progress Notes (Signed)
ANTICOAGULATION CONSULT NOTE - Initial Consult  Pharmacy Consult:  Xarelto Indication:  New Afib + Portal vein thrombosis  Allergies  Allergen Reactions  . Tetracycline Other (See Comments)    REACTION: stomach upset  . Flonase [Fluticasone] Cough  . Lisinopril Cough    Patient Measurements: Height: 5\' 3"  (160 cm) Weight: 228 lb 2.8 oz (103.5 kg) IBW/kg (Calculated) : 52.4  Vital Signs: Temp: 98.1 F (36.7 C) (06/23 0800) Temp Source: Oral (06/23 0800) BP: 146/64 mmHg (06/23 1201) Pulse Rate: 80 (06/23 1201)  Labs:  Recent Labs  05/28/15 0235 05/28/15 1627 05/29/15 0530 05/29/15 1225  HGB 10.6*  --  9.5* 9.9*  HCT 31.4*  --  28.7* 30.3*  PLT 207  --  210 221  LABPROT  --   --   --  18.0*  INR  --   --   --  1.48  CREATININE 0.59 0.55 0.57  --     Estimated Creatinine Clearance: 68.8 mL/min (by C-G formula based on Cr of 0.57).   Medical History: Past Medical History  Diagnosis Date  . COPD (chronic obstructive pulmonary disease)     O2 dependent. Followed by Dr. Joya Gaskins  . Fibromyalgia   . Hypertension   . OSA (obstructive sleep apnea)     Sleep study done April 02, 2008 showed severe obstructive sleep apnea.  . Diastolic dysfunction     Grade I diastolic dysfunction as shown on ECHO Dec 2011.  EF of 65-70%.  Marland Kitchen COPD (chronic obstructive pulmonary disease)     Followed by Dr. Joya Gaskins  . Dyslipidemia   . Osteopenia     DXA scan done on 10/23/2009 showed osteopenia of AP spine (young adult T-score -1.3), osteopenia of left femur neck (young adult T-score -1.8), and normal bone density of right femur neck (young adult T-score -0.8), with a FRAX estimate of the 10-year probability of a major osteoporotic fracture of 9.7% and probability of hip fracture 1.6%.  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 Estanislado Pandy on 11/10/2004.  MRA of the head on 03/09/2012 showed no evidence for  recanalization.  . Osteoarthrosis involving multiple sites   . Chronic back pain   . Depression   . Anxiety   . Anemia   . GERD (gastroesophageal reflux disease)   . Meniere disease     Followed by ENT Dr. Silvestre Moment  . Diverticulosis of colon   . Multiple pigmented nevi   . Hypokalemia   . S/P hysterectomy 1975  . Pneumonia 11/2004  . Knee pain   . Dysphagia   . Urosepsis 11/04/2010    Klebsiella pneumoniae  . Breast pain   . Allergic rhinitis   . Fibromyalgia   . 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     . Urinary incontinence       Assessment: 46 YOF with new Afib and portal vein thrombosis to transition from Lovenox to Xarelto.  Patient with recent bleeding from cholecystostomy tube.  Her renal function is stable; LFTs are improving; hemoglobin/hematocrit are relatively stable; and platelet count is WNL.     Goal of Therapy:  Full anticoagulation Monitor platelets by anticoagulation protocol: Yes    Plan:  - Lovenox already discontinued -  Xarelto 15mg  PO BIDWM x 21 days (first dose at dinner), then on 06/19/15 start 20mg  PO daily with supper - Pharmacy will sign off and follow peripherally.  Thank you for the consult!    Mckinze Poirier D. Mina Marble, PharmD, BCPS Pager:  [phone] 05/29/2015, 1:52 PM

## 2015-05-29 NOTE — Consult Note (Signed)
Pharmacy students rounding with [facility] service asked to comment on anti-coagulation.   Patient NM is a 74 year old female admitted on 05/27/15 for bleeding from her cholecystectomy tube. Additionally, patient found to have portal vein thrombosis possibly secondary to recent cholecystectomy procedure. CT on admission revealed acute thrombosis of portal vein branches to the lateral and medial segment of the left lobe of the liver.   Patient will require anticoagulation treatment of her portal vein thrombosis. Additionally, because of her atrial fibrillation, patient must remain on anticoagulation for prophylaxis against stroke.   Plan by team is to initiate rivaroxaban (Xarelto), at 15 mg PO BID for 21 days for treatment of portal vein thrombosis. She will then be transitioned to 20 mg PO daily for prevention of stroke in the setting of her atrial fibrillation while also providing secondary prophylaxis against recurrence of portal vein thrombosis.   Hypercoagulable panel work-up at present time may result in spurious results due to the fact that patient is suffering from current thromboembolic disease and is on anti-coagulant therapy. Since patient will remain on anti-coagulation therapy for atrial fibrillation, any such results (hypercoagulable workup panel) may not alter course of therapy.   Emphasis must be made upon administration of drug with food at discharge to receiving skilled nursing facility. If not administered with food, bioavailability of rivaroxaban may be decreased. Dosing recommendations at present time reflect adequacy of renal function (Clcr is 68.53mL/min). Periodic (every 3-4 months) re-evaluation of renal function will permit dosage adjustment should Clcr fall to 15 - 41mL/min (dose would then be 15mg  rivaroxaban PO daily with food).   Philis Fendt, PharmD Candidate [place], PharmD Candidate [facility]

## 2015-05-29 NOTE — Progress Notes (Signed)
Subjective:    No acute events overnight. Patient reports decreased abdominal pain, and she is tolerating a regular diet. No further bloody drainage seen from cholecystostomy tube. Patient denies any fever/chills, nausea/vomiting, diarrhea, chest pain, or shortness of breath.    Objective:    Vital Signs:   Temp:  [97.1 F (36.2 C)-98.3 F (36.8 C)] 98.1 F (36.7 C) (06/23 0800) Pulse Rate:  [78-85] 82 (06/23 0250) Resp:  [16-25] 22 (06/23 0250) BP: (111-146)/(71-84) 118/76 mmHg (06/23 0250) SpO2:  [92 %-100 %] 94 % (06/23 0938)    Intake/Output:   Intake/Output Summary (Last 24 hours) at 05/29/15 1127 Last data filed at 05/29/15 0630  Gross per 24 hour  Intake 2902.07 ml  Output    455 ml  Net 2447.07 ml      Physical Exam: Gen: Lying in bed in no acute distress, appears comfortable. Heart: Regular rate and rhythm, no murmurs/rubs/gallops Lungs: CTAB in anterior fields, no wheeze or crackles. Normal work of breathing on room air. Abdomen: Soft, nondistended. Mild diffuse tenderness to palpation. Active bowel sounds. Cholecystostomy drain in place with clean/dry/intact dressing. Chole drain site with no erythema or warmth. Bilious drainage in cholecystostomy bag, no signs of blood.  Extremities: Warm, well perfused, no pitting edema. SCDs in place.  Neuro: Alert, appropriate, no gross focal deficits  Labs:  Basic Metabolic Panel:  Recent Labs Lab 05/25/15 1006 05/27/15 1600 05/28/15 0235 05/28/15 1627 05/29/15 0530  NA 136 132* 132* 134* 134*  K 3.4* 2.8* 2.9* 3.0* 3.2*  CL 107 97* 102 104 104  CO2 22 23 23 23 24   GLUCOSE 202* 119* 122* 134* 129*  BUN 10 13 6 8  5*  CREATININE 0.58 0.68 0.59 0.55 0.57  CALCIUM 9.3 8.9 8.2* 8.3* 8.6*  MG  --  1.8  --  1.7  --   PHOS  --   --   --   --  2.5    Liver Function Tests:  Recent Labs Lab 05/25/15 1006 05/27/15 1600 05/28/15 0235 05/29/15 0530  AST 1065* 143* 68* 26  ALT 493* 523* 334* 195*  ALKPHOS  240* 249* 228* 195*  BILITOT 1.6* 3.1* 2.4* 1.2  PROT 7.0 6.4* 5.2* 5.5*  ALBUMIN 3.0* 2.5* 2.1* 1.9*    Recent Labs Lab 05/25/15 1006 05/27/15 1600  LIPASE 14* 11*    CBC:  Recent Labs Lab 05/25/15 1006 05/27/15 1600 05/28/15 0235 05/29/15 0530  WBC 8.3 15.5* 14.6* 13.1*  NEUTROABS 7.2 12.1*  --  10.2*  HGB 12.2 11.3* 10.6* 9.5*  HCT 36.7 33.4* 31.4* 28.7*  MCV 98.4 96.5 95.7 97.6  PLT 210 236 207 210    Cardiac Enzymes:  Recent Labs Lab 05/25/15 1006  TROPONINI <0.03    Microbiology: Results for orders placed or performed during the hospital encounter of 05/27/15  MRSA PCR Screening     Status: Abnormal   Collection Time: 05/27/15 12:59 AM  Result Value Ref Range Status   MRSA by PCR POSITIVE (A) NEGATIVE Final    Comment:        The GeneXpert MRSA Assay (FDA approved for NASAL specimens only), is one component of a comprehensive MRSA colonization surveillance program. It is not intended to diagnose MRSA infection nor to guide or monitor treatment for MRSA infections. RESULT CALLED TO, READ BACK BY AND VERIFIED WITH: W.WORK RN [phone] 05/28/15 E.GADDY   Blood Culture (routine x 2)     Status: None (Preliminary result)   Collection Time: 05/27/15  5:55 PM  Result Value Ref Range Status   Specimen Description BLOOD RIGHT HAND  Final   Special Requests BOTTLES DRAWN AEROBIC AND ANAEROBIC 5CC  Final   Culture NO GROWTH 2 DAYS  Final   Report Status PENDING  Incomplete  Blood Culture (routine x 2)     Status: None (Preliminary result)   Collection Time: 05/27/15  5:58 PM  Result Value Ref Range Status   Specimen Description BLOOD LEFT HAND  Final   Special Requests BOTTLES DRAWN AEROBIC ONLY 2CC  Final   Culture NO GROWTH 2 DAYS  Final   Report Status PENDING  Incomplete   Imaging: Ct Abdomen Pelvis W Contrast  05/27/2015   CLINICAL DATA:  Status post percutaneous cholecystostomy.  EXAM: CT ABDOMEN AND PELVIS WITH CONTRAST  TECHNIQUE: Multidetector CT  imaging of the abdomen and pelvis was performed using the standard protocol following bolus administration of intravenous contrast.  CONTRAST:  100 cc of Omnipaque 300  COMPARISON:  04/25/2015  FINDINGS: Lower chest: Dependent changes are noted within the posterior right lung base.  Hepatobiliary: A percutaneous cholecystostomy tube is identified and appears well suture rated within the gallbladder. The gallbladder appears decompressed. Small fluid collection ventral to the right hepatic lobe measures 2.3 x 3.0 x 4.5 cm, image 37/ series 2. This is mixed attenuation may represent a small biloma and/or hematoma. There is no significant biliary dilatation. 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 The hepatic veins appear patent.  Pancreas: Fatty replacement of the pancreas.  Spleen: Normal appearance of the spleen.  Adrenals/Urinary Tract: Left adrenal gland hypertrophy noted. Right adrenal gland appears normal. Unremarkable appearance of the kidneys. Urinary bladder appears normal.  Stomach/Bowel: The stomach is within normal limits. The small bowel loops have a normal course and caliber. No obstruction. Normal appearance of the colon.  Vascular/Lymphatic: Calcified atherosclerotic disease involves the abdominal aorta. There is an infrarenal abdominal aortic aneurysm which measures 3.7 cm, image 50/series 2. No enlarged retroperitoneal or mesenteric adenopathy. No enlarged pelvic or inguinal lymph nodes.  Reproductive: Previous hysterectomy.  No adnexal mass.  Other: None.  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  IMPRESSION: 1. Interval percutaneous cholecystostomy with decompression of the gallbladder. There is no evidence for biliary obstruction. 2. Small fluid collection ventral to the right lobe of liver is identified. This is mixed attenuation and may represent a small  biloma and/or small hematoma. No large hematoma is identified at this time. 3. 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 4. Aortic atherosclerosis 5. Infrarenal abdominal aortic aneurysm. Recommend followup by ultrasound in 2 years. This recommendation follows ACR consensus guidelines: White Paper of the ACR Incidental Findings Committee II on Vascular Findings. J Am Coll Radiol 2013; 10:789-794. 6. Critical Value/emergent results were called by telephone at the time of interpretation on 05/27/2015 at 7:44 pm to Dr. Carmin Muskrat , who verbally acknowledged these results.   Electronically Signed   By: Kerby Moors M.D.   On: 05/27/2015 19:44   Ir Sinus/fist Tube Chk-non Gi  05/27/2015   CLINICAL DATA:  Hemobilia, status post cholecystostomy, tachycardia, diaphoresis  EXAM: Fluoroscopic injection of the existing cholecystostomy  Date:  6/21/20166/21/2016 3:38 pm  Radiologist:  M. Daryll Brod, MD  Guidance:  Fluoroscopic  FLUOROSCOPY TIME:  54 seconds, 50 mGy  MEDICATIONS AND MEDICAL HISTORY: None.  ANESTHESIA/SEDATION: None.  CONTRAST:  24mL OMNIPAQUE IOHEXOL 300 MG/ML  SOLN  COMPLICATIONS: None immediate  PROCEDURE: Informed consent was obtained from the patient following explanation of the procedure, risks, benefits and alternatives. The patient understands, agrees and consents for the procedure. All questions were addressed. A time out was performed.  Under sterile conditions, the existing cholecystostomy was injected with contrast under fluoroscopy. Images obtained for documentation.  Cholangiogram: Stable position of the cholecystostomy tube within the gallbladder. Filling defects throughout the gallbladder could represent blood clots and/or stones. Cystic duct is patent. Contrast does reach the common hepatic duct. No communication to be any adjacent vascular structure to account for the hemobilia.  IMPRESSION:  Cholecystostomy in good position.  Patent cystic duct.  Gallbladder diffuse filling defect compatible with blood clot versus cholelithiasis.   Electronically Signed   By: Jerilynn Mages.  Shick M.D.   On: 05/27/2015 16:26   Dg Chest Port 1 View  05/27/2015   CLINICAL DATA:  Abdominal pain and tachycardia.  EXAM: PORTABLE CHEST - 1 VIEW  COMPARISON:  05/25/2015  FINDINGS: Portable view of the chest demonstrates low lung volumes. Difficult to exclude vascular congestion. Heart size is accentuated by the low lung volumes. Negative for pneumothorax.  IMPRESSION: Low lung volumes without focal disease. Difficult to exclude vascular congestion.   Electronically Signed   By: Markus Daft M.D.   On: 05/27/2015 17:54      Medications:    Infusions: None    Scheduled Medications: . amiodarone  200 mg Oral BID  . Chlorhexidine Gluconate Cloth  6 each Topical Q0600  . enoxaparin (LOVENOX) injection  100 mg Subcutaneous Q12H  . mometasone-formoterol  2 puff Inhalation BID  . mupirocin ointment  1 application Nasal BID  . potassium chloride  40 mEq Oral BID  . sertraline  100 mg Oral BID  . tiotropium  18 mcg Inhalation Daily  . topiramate  50 mg Oral BID    PRN Medications: albuterol, ALPRAZolam, HYDROcodone-acetaminophen   Assessment/ Plan:     Patient is a 74 year old woman with history of HTN, COPD, and acute cholecystitis in May 2016 who presented with abdominal pain with bloody drainage from cholecystotomy tube, found to have acute portal vein thrombosis.  Principal Problem:   Portal vein thrombosis Active Problems:   Essential hypertension   Morbid obesity   SVT (supraventricular tachycardia)   Atrial fibrillation with RVR   Abdominal aortic aneurysm   Hemobilia  Acute portal vein thrombosis: Improved abdominal pain and LFTs and WBC are downtrending. Acute PVT likely secondary to recent cholecystostomy procedure. CT on admission revealed acute thrombosis of the portal vein branches to the  lateral and medial segment of the left lobe. Patient is s/p cholecystostomy placement by IR on 04/26/15 and reported bloody drainage from chole tube (see below). No history of thromboembolic complications, although patient has paroxysmal Afib.  -Appreciate GI recommendations for further workup of acute portal vein thrombosis -Per GI, pt needs doppler ultrasound in 2-3 weeks to further evaluate hepatic and portal flow -Follow up AFP -Consider outpatient hypercoagulation workup -Appreciate IR recommendations and management of cholecystostomy complications -Continue therapeutic Lovenox until transition to Xarelto or other direct oral anticoagulant (DOAC) today -Recheck CBC and INR, plan to calculate Child-Pugh's score and start DOAC if indicated -Norco 5-325 2 tabs q6h prn pain -Daily CMP, CBC, and INR  S/p acute cholecystitis, possible bloody drainage from cholecystotomy tube: No signs of  bloody drainage from cholecystostomy tube. However, Hgb continues to drop 12.2>>10.3>>9. 5 today. Likely secondary to recent acute bleed, as patient reported 5 days of bloody chole tube drainage on admission. Fluoroscopic injection of cholecystostomy on 05/25/15 revealed stable position of chole tube and blood clots in gallbladder. CT abd/pelvis with no evidence of intra-abdominal bleed.  -Recheck CBC and INR in PM -Appreciate IR recommendations and management of cholecystostomy complications -Pt may need imaging of gallbladder drain tract if bleeding recurs  Atrial fibrillation with RVR: Patient maintaining normal sinus rhythm on amiodarone drip. Paroxysmal Afib first episode during last admission in May 2016. She also had SVT on admission, resolved with adenosine.  cute illness and hypokalemia are likely provoking factors. CHADS2-VASC score at least 7, not on anticoagulation. -Appreciate Cardiology recommendations -Change amiodarone drip to amiodarone 200 mg PO BID -Anticoagulation as above - patient will need  long-term anticoagulation with DOAC or warfarin -Check TSH and PFTs with DLCO given amiodarone use -Treat portal vein thrombosis as above -Replete potassium as below -Telemetry monitoring  Sepsis: Presented with 3/4 SIRS criteria (HR 154, RR 25, WBC 15.5) with no signs of infection on CXR. SIRS signs may be secondary to acute portal vein thrombosis rather than infection. No infectious source identified on CXR, CT abd/pelvis, or urinalysis. Patient now in normal sinus rhythm with normal respiratory rate, and WBC slightly decreased.  -Blood cultures from 05/27/15 - negative for growth x 48 hours -Discontinue vanc and Zosyn   HTN: Soft BPs on admission, now normotensive. -Continue to hold home HCTZ 12.5 mg daily and nifedipine 90 mg daily  Hypokalemia: Last potassium 3.2, up from 2.8 on admission, asymptomatic. Patient had hypokalemia during last admission.  -Continue potassium chloride 40 mEq PO BID -Monitor with daily CMP  Diastolic heart failure: Last TTE 04/26/2015 w/ EF of 55-60% and grade 1 diastolic dysfunction. No active issues.   COPD: At home, patient on Advair 1 puff bid, Spiriva 18 mch inh daily, and albuterol 1-2 puff q6hprn - Continue on Dulera1 puff bid, Spiriva 18 mch inh daily, and albuterol 1-2 puff q6h prn wheezing or dyspnea  Fibromyalgia- At home patient on norco 7.5-325mg  QID prn, amitriptyline 50mg  qhs, carisoprodol 350mg  qd prn -Continue Norco 5-325 2 tabs q6h prn pain -Hold home amitriptyline and carisoprodol   OSA: No active issues - Continue CPAP nightly  Anxiety and depression: No active issues. - Continue home zoloft 100mg  BID and xanax 0.5mg  BID oprn  Migraines: No active issues. - Continue home Topamax 50 mg BID  FEN - Diet: Heart-healthy - Electrolytes: Monitor and replete PRN. Replete potassium as above.  - IVFs: None  DVT ppx: Therapeutic Lovenox for portal vein thrombosis  CODE: FULL  Dispo: Disposition is deferred at this time, awaiting  improvement of current medical problems.   The patient does have a current PCP Bertha Stakes, MD) and does need an [facility] hospital follow-up appointment after discharge.  The patient does not have transportation limitations that hinder transportation to clinic appointments.   SERVICE NEEDED AT [place]         Y = Yes, Blank = No PT:   OT:   RN:   Equipment:   Other:      Length of Stay: 2 day(s)   Signed: Betsey Amen, MS4

## 2015-05-29 NOTE — Progress Notes (Signed)
  PROGRESS NOTE MEDICINE TEACHING ATTENDING   Day 2 of stay Patient name: Jennifer [name]   Medical record number: [id number] Date of birth: Jan 30, 1941   Patient sitting up in bed eating breakfast. She reports much less pain abdomen today.   Blood pressure 118/76, pulse 82, temperature 98.1 F (36.7 C), temperature source Oral, resp. rate 22, height $RemoveBe'5\' 3"'[id number]$  (1.6 m), weight 228 lb 2.8 oz (103.5 kg), SpO2 94 %. The patient is alert and oriented, comfortable, in no acute distress. PERRL, EOMI. Heart exhibits regular rate and rhythm, no murmurs. Lungs are clear to auscultation. Abdomensoft, cholecystostomy tube no active bleeding. Mild tenderness + There is no pedal edema and good pedal pulses. There are no gross focal neurological deficits apparent.   Assessment/Plan  Portal Vein Thrombosis secondary to recent GB procedure - Evaluated by GI and opinion concurs that no other liver pathology possibly exists (pancreatitis, malignancy or cirrhosis) other than the recent procedure that gave rise to this thrombosis. She is on lovenox for anticoagulation and would be transistioned to an oral anticoagulant. She expresses reluctance to be on coumadin as she does not want regular clinic follow ups. We will consider a DOAC for her. Encouraged the patient to talk to her daughter about this and we will ask pharmacy to have a discussion about their possible options. Follow up with doppler USG in 2-3 weeks. LFTs trending down to normal. Drainage now bilious.  Pain control - adequate. The patient is on her home [facility].  Paroxysmal Afib - CHADS2VASC 3 (htn, age, chf) Now converted to NSR. IV amio has been transitioned to PO. We will check TSH.   Hypokalemia - We will replete to keep K >4.  Sepsis - the patient was empirically started on Vanc+ zocyn because she met sepsis criteria. Her tachycardia and whilte cound can be attributed to acute reactive process. Her RUQ tenderness is improved. She is afebrile. We will continue  until blood cultures are negative and then stop antibiotics.  Rest per Dr Encarnacion Slates note.  I have discussed the care of this patient with my IM team residents. Please see the resident note for details.  [place], [place] 05/29/2015, 11:26 AM.

## 2015-05-29 NOTE — Progress Notes (Signed)
Pt to TX to [id number], VSS, called report. Family aware of [facility].

## 2015-05-29 NOTE — Consult Note (Signed)
Pharmacy students rounding with [place] [facility] service asked to comment on IV to oral conversion of Amiodarone.   Patient NM is a 74 year old female admitted on 05/27/15 for bloody drainage from cholecystectomy tube. Additionally, she was found to have newly diagnosed atrial fibrillation. She was started on Amiodarone drip for rate control. Patient was continued on drip from 6/21 to 6/23, at which time, team requested that she be switched to oral.   With patients initiated on amiodarone therapy, goal load is 10g. Patient received 1410 mg of IV amiodarone. This is equivalent to 2820 mg of oral amiodarone. Per cardiology, patient is to be started on 200 mg PO BID on 05/29/15. Patient will receive her total load of 10g after 18 days of this oral regimen. Starting on 06/16/15, patient can then be switched from 200 mg BID to 200 mg once daily of oral amiodarone.   Because of her new regimen, patient will require outpatient monitoring. Liver function tests (LFTs), thyroid function tests (TSH and Free T4), and pulmonary function tests (PFTs) should be assessed every 3-6 months. Additionally, patient should be instructed to report any visual changes and to be aware that amiodarone can increase photosensitivity.    Myrtie Cruise, PharmD Candidate, [place], PharmD Candidate, [facility]

## 2015-05-29 NOTE — Care Management Note (Addendum)
Case Management Note  Patient Details  Name: Jennifer [name] MRN: [id number] Date of Birth: 1941/04/12  Subjective/Objective:             Pt from SNF([facility]) admitted with tachycardia and bleeding noted in biliary tube , recently admitted in May for acute cholecystitis, which was treated with a cholecystostomy tube and was to be operated upon in 6 weeks.   Action/Plan: Return to SNF  When medically stable. CM to f/u with dispositions needs.  Expected Discharge Date:                  Expected Discharge Plan:  [place] (Pt is from [place])  In-House Referral:  Clinical Social Work  Discharge planning Services  CM Consult  Post Acute Care Choice:    Choice offered to:     DME Arranged:    DME Agency:     HH Arranged:    [place] Agency:     Status of Service:  In process, will continue to follow  Medicare Important Message Given:    Date Medicare IM Given:    Medicare IM give by:    Date Additional Medicare IM Given:    Additional Medicare Important Message give by:     If discussed at [place] of Stay Meetings, dates discussed:    Additional Comments: Lars Pinks (daughter) [phone] Sharin Mons, RN 05/29/2015, 12:14 PM

## 2015-05-30 DIAGNOSIS — I81 Portal vein thrombosis: Secondary | ICD-10-CM

## 2015-05-30 LAB — COMPREHENSIVE METABOLIC PANEL
ALBUMIN: 2 g/dL — AB (ref 3.5–5.0)
ALK PHOS: 183 U/L — AB (ref 38–126)
ALT: 135 U/L — AB (ref 14–54)
AST: 20 U/L (ref 15–41)
Anion gap: 5 (ref 5–15)
BUN: <5 mg/dL — ABNORMAL LOW (ref 6–20)
CALCIUM: 8.9 mg/dL (ref 8.9–10.3)
CO2: 26 mmol/L (ref 22–32)
Chloride: 106 mmol/L (ref 101–111)
Creatinine, Ser: 0.58 mg/dL (ref 0.44–1.00)
GFR calc non Af Amer: >60 mL/min (ref >60)
Glucose, Bld: 105 mg/dL — ABNORMAL HIGH (ref 65–99)
POTASSIUM: 4 mmol/L (ref 3.5–5.1)
SODIUM: 137 mmol/L (ref 135–145)
Total Bilirubin: 1 mg/dL (ref 0.3–1.2)
Total Protein: 5.6 g/dL — ABNORMAL LOW (ref 6.5–8.1)

## 2015-05-30 LAB — CBC
HCT: 29.7 % — ABNORMAL LOW (ref 36.0–46.0)
Hemoglobin: 9.7 g/dL — ABNORMAL LOW (ref 12.0–15.0)
MCH: 32.3 pg (ref 26.0–34.0)
MCHC: 32.7 g/dL (ref 30.0–36.0)
MCV: 99 fL (ref 78.0–100.0)
Platelets: 230 10*3/uL (ref 150–400)
RBC: 3 MIL/uL — AB (ref 3.87–5.11)
RDW: 14.8 % (ref 11.5–15.5)
WBC: 12.5 10*3/uL — AB (ref 4.0–10.5)

## 2015-05-30 LAB — MAGNESIUM: MAGNESIUM: 2 mg/dL (ref 1.7–2.4)

## 2015-05-30 LAB — HEMOGLOBIN A1C
HEMOGLOBIN A1C: 6 % — AB (ref 4.8–5.6)
MEAN PLASMA GLUCOSE: 126 mg/dL

## 2015-05-30 LAB — AFP TUMOR MARKER: AFP-Tumor Marker: 3.4 ng/mL (ref 0.0–8.3)

## 2015-05-30 MED ORDER — HYDROCODONE-ACETAMINOPHEN 5-325 MG PO TABS: 1.0000 | ORAL_TABLET | ORAL | Status: DC | PRN

## 2015-05-30 MED ORDER — ALPRAZOLAM 0.5 MG PO TABS: 0.5000 mg | ORAL_TABLET | Freq: Two times a day (BID) | ORAL | Status: DC | PRN

## 2015-05-30 MED ORDER — MAGNESIUM HYDROXIDE 400 MG/5ML PO SUSP
15.0000 mL | Freq: Once | ORAL | Status: AC
  Administered 2015-05-30: 15 mL via ORAL
  Filled 2015-05-30: qty 30

## 2015-05-30 MED ORDER — RIVAROXABAN 15 MG PO TABS: 15.0000 mg | ORAL_TABLET | Freq: Two times a day (BID) | ORAL | Status: DC

## 2015-05-30 MED ORDER — RIVAROXABAN 20 MG PO TABS: 20.0000 mg | ORAL_TABLET | Freq: Every day | ORAL | Status: DC

## 2015-05-30 MED ORDER — POTASSIUM CHLORIDE CRYS ER 20 MEQ PO TBCR: 40.0000 meq | EXTENDED_RELEASE_TABLET | Freq: Two times a day (BID) | ORAL | Status: DC

## 2015-05-30 MED ORDER — MUPIROCIN 2 % EX OINT: 1.0000 "application " | TOPICAL_OINTMENT | Freq: Two times a day (BID) | CUTANEOUS | Status: DC

## 2015-05-30 MED ORDER — BISACODYL 10 MG RE SUPP
10.0000 mg | Freq: Once | RECTAL | Status: AC
  Administered 2015-05-30: 10 mg via RECTAL
  Filled 2015-05-30: qty 1

## 2015-05-30 MED ORDER — POLYETHYLENE GLYCOL 3350 17 G PO PACK
17.0000 g | PACK | Freq: Once | ORAL | Status: AC
  Administered 2015-05-30: 17 g via ORAL
  Filled 2015-05-30: qty 1

## 2015-05-30 MED ORDER — ALPRAZOLAM 0.5 MG PO TABS: 0.5000 mg | ORAL_TABLET | Freq: Three times a day (TID) | ORAL | Status: DC | PRN

## 2015-05-30 MED ORDER — AMIODARONE HCL 200 MG PO TABS: 200.0000 mg | ORAL_TABLET | Freq: Two times a day (BID) | ORAL | Status: DC

## 2015-05-30 NOTE — Progress Notes (Signed)
Subjective:  Vitals stable. Eating breakfast. Has some abdominal pain still but overall feeling better. No new complaints.   Objective: Vital signs in last 24 hours: Filed Vitals:   05/29/15 1520 05/29/15 2107 05/30/15 0528 05/30/15 0813  BP: 128/68 139/66 158/71   Pulse: 81 86 87   Temp: 98.5 F (36.9 C) 98.7 F (37.1 C) 97.5 F (36.4 C)   TempSrc: Oral Oral Oral   Resp: 16 18 18    Height:      Weight:      SpO2: 97% 99% 97% 98%   Weight change:   Intake/Output Summary (Last 24 hours) at 05/30/15 0924 Last data filed at 05/30/15 0919  Gross per 24 hour  Intake    480 ml  Output    800 ml  Net   -320 ml   Vitals reviewed. General: sitting in chair, NAD HEENT: PERRL, EOMI, no scleral icterus Cardiac: RRR, no rubs, murmurs or gallops Pulm: clear to auscultation bilaterally on anterior field, no wheezes, rales, or rhonchi Abd: soft, cholecystostomy tube appears intact, no bleeding. Has mod tenderness on lower abdomen. Ext: warm and well perfused, no pedal edema Neuro: alert and oriented X3, cranial nerves II-XII grossly intact, strength and sensation to light touch equal in bilateral upper and lower extremities  Lab Results: Basic Metabolic Panel:  Recent Labs Lab 05/28/15 1627 05/29/15 0530 05/30/15 0530  NA 134* 134* 137  K 3.0* 3.2* 4.0  CL 104 104 106  CO2 23 24 26   GLUCOSE 134* 129* 105*  BUN 8 5* <5*  CREATININE 0.55 0.57 0.58  CALCIUM 8.3* 8.6* 8.9  MG 1.7  --  2.0  PHOS  --  2.5  --    Liver Function Tests:  Recent Labs Lab 05/29/15 0530 05/30/15 0530  AST 26 20  ALT 195* 135*  ALKPHOS 195* 183*  BILITOT 1.2 1.0  PROT 5.5* 5.6*  ALBUMIN 1.9* 2.0*    Recent Labs Lab 05/25/15 1006 05/27/15 1600  LIPASE 14* 11*   No results for input(s): AMMONIA in the last 168 hours. CBC:  Recent Labs Lab 05/27/15 1600  05/29/15 0530 05/29/15 1225 05/30/15 0530  WBC 15.5*  < > 13.1* 13.1* 12.5*  NEUTROABS 12.1*  --  10.2*  --   --   HGB  11.3*  < > 9.5* 9.9* 9.7*  HCT 33.4*  < > 28.7* 30.3* 29.7*  MCV 96.5  < > 97.6 98.4 99.0  PLT 236  < > 210 221 230  < > = values in this interval not displayed. Cardiac Enzymes:  Recent Labs Lab 05/25/15 1006  TROPONINI <0.03  Urine Drug Screen: Urinalysis:  Recent Labs Lab 05/27/15 1643  COLORURINE AMBER*  LABSPEC 1.016  PHURINE 5.5  GLUCOSEU NEGATIVE  HGBUR NEGATIVE  BILIRUBINUR MODERATE*  KETONESUR NEGATIVE  PROTEINUR NEGATIVE  UROBILINOGEN 0.2  NITRITE NEGATIVE  LEUKOCYTESUR SMALL*   Misc. Labs: Micro Results: Recent Results (from the past 240 hour(s))  MRSA PCR Screening     Status: Abnormal   Collection Time: 05/27/15 12:59 AM  Result Value Ref Range Status   MRSA by PCR POSITIVE (A) NEGATIVE Final    Comment:        The GeneXpert MRSA Assay (FDA approved for NASAL specimens only), is one component of a comprehensive MRSA colonization surveillance program. It is not intended to diagnose MRSA infection nor to guide or monitor treatment for MRSA infections. RESULT CALLED TO, READ BACK BY AND VERIFIED WITH: W.WORK RN [phone]  05/28/15 E.GADDY   Blood Culture (routine x 2)     Status: None (Preliminary result)   Collection Time: 05/27/15  5:55 PM  Result Value Ref Range Status   Specimen Description BLOOD RIGHT HAND  Final   Special Requests BOTTLES DRAWN AEROBIC AND ANAEROBIC 5CC  Final   Culture NO GROWTH 2 DAYS  Final   Report Status PENDING  Incomplete  Blood Culture (routine x 2)     Status: None (Preliminary result)   Collection Time: 05/27/15  5:58 PM  Result Value Ref Range Status   Specimen Description BLOOD LEFT HAND  Final   Special Requests BOTTLES DRAWN AEROBIC ONLY 2CC  Final   Culture NO GROWTH 2 DAYS  Final   Report Status PENDING  Incomplete   Studies/Results: No results found. Medications: I have reviewed the patient's current medications. Scheduled Meds: . amiodarone  200 mg Oral BID  . Chlorhexidine Gluconate Cloth  6 each Topical  Q0600  . mometasone-formoterol  2 puff Inhalation BID  . mupirocin ointment  1 application Nasal BID  . potassium chloride  40 mEq Oral BID  . rivaroxaban  15 mg Oral BID WC   Followed by  . [START ON 06/19/2015] rivaroxaban  20 mg Oral Q supper  . sertraline  100 mg Oral BID  . tiotropium  18 mcg Inhalation Daily  . topiramate  50 mg Oral BID   Continuous Infusions:   PRN Meds:.albuterol, ALPRAZolam, HYDROcodone-acetaminophen Assessment/Plan: Principal Problem:   Portal vein thrombosis Active Problems:   Essential hypertension   Morbid obesity   SVT (supraventricular tachycardia)   Atrial fibrillation with RVR   Abdominal aortic aneurysm   Hemobilia  Acute/recent Portal vein thrombosis -in the setting of cholecystostomy placement on 04/2015 for acute choelcystitis Presented with abdominal pain, on going bleeding seen at cholecystostomy site for last few days. CT abdomen showed portal vein thrombosis seen  on lateral and medial segment of left lobe. This is Likely 2/2 to recent procedure of cholecystostomy insertion but could be other causes as well  Initially had significant LFT elevation on 6/19 when presented to the ED. Then on admission she still has LFT's but down from 6/19. LFTs are trending down further. Leukocytosis also trending down, remains afebrile. and tachycardia (initially was in SVT then to Afib then now NSR after amio) likely 2/2 to this but now resolved.  - appreciate IR recs: ok with anticoag as not actively bleeding. If bleeding recurs, may need GB drain tract injection/imaging - GI consulted for recs about why this patient would have portal vein thrombosis. No evidence of malignancy, pancreatitis, or cirrhosis. They also think it's related to the GB surgery. GI Recommended AFP (3.4), hypercoag panel outpatient, and Doppler u/s in 2-3 weeks to further evaluate her hepatic/portal flow. - switched from Lovenox to Xarelto on 05/30/2015 for patient preference for NOAC  over coumadin.  - Chole tube has to stay for 6 weeks. Was placed on 04/26/15. Has appt to f/up with surgery Dr. Hulen Skains on 06/03/15.   Sepsis criteria - no clear source of infection. No resp symptoms, no dysuria, does have ab tenderness likely from the portal vein thrombosis.  BP stable. Afebrile now. - d/ced abx on 6/23 as bcx negative for 48 hours.  Afib with RVR - CHADS2 vasc 3 (htn, age, chf) likely 2/2 to sepsis or Acute Portal vein thrombosis (was initially in SVT, s/p adenosine),  Was on amio drip, converted to PO amio per cards as was in NSR. -  appreciate card recs. - will switch to PO amio  - needs to be anticoagulated. Was On lovenox >> Xarelto lifetime. - repleted K+ and Mag as needed.   Hypokalemia and hypomag- replete as needed  HTN - at home on HCTZ 12.5mg  daily and nifedipine 90mg  daily.  - resume home meds as BP going up today in 150's.  CHF - last TTE 04/2015 w/ EF 55-60% with grade 1 diastolic dysfunction - euvolemic now. Monitor.  COPD - o2 depended - on advair, spiriva, and albuterol daily ath ome - cont home meds  OSA - cont cpap qhs  Anxiety and depression - cont home zoloft and xanax  migraine - cont home topamax  Pre-diabetes - hgba1c 6.0 - counseled on lifestyle modification.    Dispo: Disposition is deferred at this time, awaiting improvement of current medical problems.  Anticipated discharge in approximately 1-2 day(s).   The patient does have a current PCP Bertha Stakes, MD) and does need an [facility] hospital follow-up appointment after discharge.  The patient does have transportation limitations that hinder transportation to clinic appointments.  .Services Needed at time of discharge: Y = Yes, Blank = No PT:   OT:   RN:   Equipment:   Other:     LOS: 3 days   Jennifer Nims, MD 05/30/2015, 9:24 AM

## 2015-05-30 NOTE — Discharge Summary (Signed)
Name: Jennifer [name] MRN: [id number] DOB: February 10, 1941 74 y.o. PCP: Bertha Stakes, MD  Date of Admission: 05/27/2015  3:08 PM Date of Discharge: 05/30/2015 Attending Physician: Madilyn Fireman, MD  Discharge Diagnosis:  Principal Problem:   Portal vein thrombosis Active Problems:   Essential hypertension   Morbid obesity   SVT (supraventricular tachycardia)   Atrial fibrillation with RVR   Abdominal aortic aneurysm   Hemobilia  Discharge Medications:   Medication List    STOP taking these medications        amoxicillin-clavulanate 875-125 MG per tablet  Commonly known as:  AUGMENTIN     ibuprofen 200 MG tablet  Commonly known as:  ADVIL,MOTRIN     ondansetron 4 MG disintegrating tablet  Commonly known as:  ZOFRAN ODT     triamcinolone cream 0.1 %  Commonly known as:  KENALOG      TAKE these medications        albuterol 108 (90 BASE) MCG/ACT inhaler  Commonly known as:  PROAIR HFA  INHALE 2 PUFFS BY MOUTH INTO LUNGS FOUR TIMES DAILY AS NEEDED     ALPRAZolam 0.5 MG tablet  Commonly known as:  XANAX  Take 1 tablet (0.5 mg total) by mouth 3 (three) times daily as needed for anxiety.     amiodarone 200 MG tablet  Commonly known as:  PACERONE  Take 1 tablet (200 mg total) by mouth 2 (two) times daily.     amitriptyline 50 MG tablet  Commonly known as:  ELAVIL  Take 1 tablet (50 mg total) by mouth at bedtime.     aspirin 81 MG EC tablet  Take 81 mg by mouth daily.     carisoprodol 350 MG tablet  Commonly known as:  SOMA  Take 1 tablet (350 mg total) by mouth daily as needed for muscle spasms.     CINNAMON PO  Take 1 tablet by mouth 2 (two) times daily.     dextromethorphan-guaiFENesin 30-600 MG per 12 hr tablet  Commonly known as:  MUCINEX DM  Take 1 tablet by mouth 2 (two) times daily.     docusate sodium 100 MG capsule  Commonly known as:  COLACE  Take 100 mg by mouth daily as needed for mild constipation or moderate constipation.     Fluticasone-Salmeterol 250-50 MCG/DOSE Aepb  Commonly known as:  ADVAIR DISKUS  Inhale 1 puff into the lungs 2 (two) times daily.     hydrochlorothiazide 25 MG tablet  Commonly known as:  HYDRODIURIL  Take 0.5 tablets (12.5 mg total) by mouth daily.     HYDROcodone-acetaminophen 5-325 MG per tablet  Commonly known as:  NORCO/VICODIN  Take 1-2 tablets by mouth every 4 (four) hours as needed for moderate pain.     hydroxypropyl methylcellulose / hypromellose 2.5 % ophthalmic solution  Commonly known as:  ISOPTO TEARS / GONIOVISC  Place 2 drops into both eyes 3 (three) times daily as needed. Dry eye     meclizine 12.5 MG tablet  Commonly known as:  ANTIVERT  Take 1 tablet (12.5 mg total) by mouth 2 (two) times daily as needed for dizziness.     multivitamin tablet  Take 1 tablet by mouth daily.     mupirocin ointment 2 %  Commonly known as:  BACTROBAN  Place 1 application into the nose 2 (two) times daily.     NIFEdipine 90 MG 24 hr tablet  Commonly known as:  ADALAT CC  Take 1 tablet (90 mg total)  by mouth daily.     omeprazole 20 MG capsule  Commonly known as:  PRILOSEC  Take 1 capsule (20 mg total) by mouth 2 (two) times daily.     potassium chloride SA 20 MEQ tablet  Commonly known as:  K-DUR,KLOR-CON  Take 20 mEq by mouth 2 (two) times daily.     potassium chloride SA 20 MEQ tablet  Commonly known as:  K-DUR,KLOR-CON  Take 2 tablets (40 mEq total) by mouth 2 (two) times daily.     ranitidine 150 MG tablet  Commonly known as:  ZANTAC  Take 1 tablet (150 mg total) by mouth at bedtime.     RED YEAST RICE PO  Take 1 tablet by mouth 2 (two) times daily.     Rivaroxaban 15 MG Tabs tablet  Commonly known as:  XARELTO  Take 1 tablet (15 mg total) by mouth 2 (two) times daily with a meal.     rivaroxaban 20 MG Tabs tablet  Commonly known as:  XARELTO  Take 1 tablet (20 mg total) by mouth daily with supper.  Start taking on:  06/19/2015     sertraline 100 MG tablet    Commonly known as:  ZOLOFT  Take 1 tablet (100 mg total) by mouth 2 (two) times daily.     tiotropium 18 MCG inhalation capsule  Commonly known as:  SPIRIVA HANDIHALER  Place 1 capsule (18 mcg total) into inhaler and inhale daily.     topiramate 50 MG tablet  Commonly known as:  TOPAMAX  Take 1 tablet (50 mg total) by mouth 2 (two) times daily.     Vitamin D3 1000 UNITS Caps  Take 1 capsule (1,000 Units total) by mouth daily.        Disposition and follow-up:   Jennifer [name] was discharged from [facility] in Stable condition.  At the hospital follow up visit please address:  1.  We have started Xarelto for her Afib and also for Portal vein thrombosis. This will be at 15 mg PO BID for 21 days then 20mg  daily. She needs to be on this indefinitely.   Please make sure this is administered with food as the bioavailability may decrease if not taken with food.  Needs to f/up with surgery Dr. Hulen Skains for cholecystostomy tube take out/further recs about her cholecystitis.  Needs to f/up with PCP when ready to be discharged from SNF.   Needs outpatient PFT ASAP as we started her on amio for Afib.   Has hgba1c of 6.0. Continue outpatient discussion about lifestyle modifications.   May consider hypercoag study outpatient.  Needs doppler U/s in 2-3 weeks to further evaluate her hepatic and portal flow.  2.  Labs / imaging needed at time of follow-up: CMET in 3-4 days to trend LFTs.  3.  Pending labs/ test needing follow-up:   Follow-up Appointments: Follow-up Information    Follow up with Judeth Horn, MD On 06/03/2015.   Specialty:  General Surgery   Why:  @8 :30 am.   Contact information:   [address] [place] [address] [phone]       Follow up with Truitt Merle, NP On 06/03/2015.   Specialties:  Nurse Practitioner, Interventional Cardiology, Cardiology, Radiology   Why:  Appointment with provider at 11 AM, please arrive 15 minutes early  for paperwork   Contact information:   [address] [place] [address] [phone]       Discharge  Instructions:   Consultations: Treatment Team:  Wilford Corner, MD  Procedures Performed:  Ct Abdomen Pelvis W Contrast  05/27/2015   CLINICAL DATA:  Status post percutaneous cholecystostomy.  EXAM: CT ABDOMEN AND PELVIS WITH CONTRAST  TECHNIQUE: Multidetector CT imaging of the abdomen and pelvis was performed using the standard protocol following bolus administration of intravenous contrast.  CONTRAST:  100 cc of Omnipaque 300  COMPARISON:  04/25/2015  FINDINGS: Lower chest: Dependent changes are noted within the posterior right lung base.  Hepatobiliary: A percutaneous cholecystostomy tube is identified and appears well suture rated within the gallbladder. The gallbladder appears decompressed. Small fluid collection ventral to the right hepatic lobe measures 2.3 x 3.0 x 4.5 cm, image 37/ series 2. This is mixed attenuation may represent a small biloma and/or hematoma. There is no significant biliary dilatation. 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 The hepatic veins appear patent.  Pancreas: Fatty replacement of the pancreas.  Spleen: Normal appearance of the spleen.  Adrenals/Urinary Tract: Left adrenal gland hypertrophy noted. Right adrenal gland appears normal. Unremarkable appearance of the kidneys. Urinary bladder appears normal.  Stomach/Bowel: The stomach is within normal limits. The small bowel loops have a normal course and caliber. No obstruction. Normal appearance of the colon.  Vascular/Lymphatic: Calcified atherosclerotic disease involves the abdominal aorta. There is an infrarenal abdominal aortic aneurysm which measures 3.7 cm, image 50/series 2. No enlarged retroperitoneal or mesenteric adenopathy. No enlarged pelvic or inguinal lymph nodes.  Reproductive: Previous  hysterectomy.  No adnexal mass.  Other: None.  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  IMPRESSION: 1. Interval percutaneous cholecystostomy with decompression of the gallbladder. There is no evidence for biliary obstruction. 2. Small fluid collection ventral to the right lobe of liver is identified. This is mixed attenuation and may represent a small biloma and/or small hematoma. No large hematoma is identified at this time. 3. 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 4. Aortic atherosclerosis 5. Infrarenal abdominal aortic aneurysm. Recommend followup by ultrasound in 2 years. This recommendation follows ACR consensus guidelines: White Paper of the ACR Incidental Findings Committee II on Vascular Findings. J Am Coll Radiol 2013; 10:789-794. 6. Critical Value/emergent results were called by telephone at the time of interpretation on 05/27/2015 at 7:44 pm to Dr. Carmin Muskrat , who verbally acknowledged these results.   Electronically Signed   By: Kerby Moors M.D.   On: 05/27/2015 19:44   Ir Sinus/fist Tube Chk-non Gi  05/27/2015   CLINICAL DATA:  Hemobilia, status post cholecystostomy, tachycardia, diaphoresis  EXAM: Fluoroscopic injection of the existing cholecystostomy  Date:  6/21/20166/21/2016 3:38 pm  Radiologist:  M. Daryll Brod, MD  Guidance:  Fluoroscopic  FLUOROSCOPY TIME:  54 seconds, 50 mGy  MEDICATIONS AND MEDICAL HISTORY: None.  ANESTHESIA/SEDATION: None.  CONTRAST:  64mL OMNIPAQUE IOHEXOL 300 MG/ML  SOLN  COMPLICATIONS: None immediate  PROCEDURE: Informed consent was obtained from the patient following explanation of the procedure, risks, benefits and alternatives. The patient understands, agrees and consents for the procedure. All questions were addressed. A time out was performed.  Under sterile conditions, the existing cholecystostomy was injected with  contrast under fluoroscopy. Images obtained for documentation.  Cholangiogram: Stable position of the cholecystostomy tube within the gallbladder. Filling defects throughout the gallbladder could represent  blood clots and/or stones. Cystic duct is patent. Contrast does reach the common hepatic duct. No communication to be any adjacent vascular structure to account for the hemobilia.  IMPRESSION: Cholecystostomy in good position.  Patent cystic duct.  Gallbladder diffuse filling defect compatible with blood clot versus cholelithiasis.   Electronically Signed   By: Jerilynn Mages.  Shick M.D.   On: 05/27/2015 16:26   Dg Chest Port 1 View  05/27/2015   CLINICAL DATA:  Abdominal pain and tachycardia.  EXAM: PORTABLE CHEST - 1 VIEW  COMPARISON:  05/25/2015  FINDINGS: Portable view of the chest demonstrates low lung volumes. Difficult to exclude vascular congestion. Heart size is accentuated by the low lung volumes. Negative for pneumothorax.  IMPRESSION: Low lung volumes without focal disease. Difficult to exclude vascular congestion.   Electronically Signed   By: Markus Daft M.D.   On: 05/27/2015 17:54   Dg Chest Port 1 View  05/25/2015   CLINICAL DATA:  Acute chest pain.  EXAM: PORTABLE CHEST - 1 VIEW  COMPARISON:  Apr 25, 2015.  FINDINGS: Stable cardiomediastinal silhouette. No pneumothorax or pleural effusion is noted. Both lungs are clear. The visualized skeletal structures are unremarkable.  IMPRESSION: No acute cardiopulmonary abnormality seen.   Electronically Signed   By: Marijo Conception, M.D.   On: 05/25/2015 10:20   Ir Cholangiogram Existing Tube  05/27/2015   CLINICAL DATA:  Hemobilia. Examination of the drainage to for the cholecystostomy tube demonstrates predominately serosanguineous fluid and a few scattered very dark gold blood clots. There is no bright red blood in the drainage bag.  EXAM: CHOLANGIOGRAM VIA EXISTING CATHETER  PROCEDURE: Contrast was injected into the cholecystostomy tube and imaging was  obtained.  FINDINGS: There are filling defects in the gallbladder and cystic duct compatible with thrombus. The cystic duct is patent.  IMPRESSION: The cystic duct is patent. Blood clots are present in the gallbladder. Recommendations are for drain capping and resuming bag drainage tomorrow.   Electronically Signed   By: Marybelle Killings M.D.   On: 05/27/2015 07:51    Admission HPI:   Pt is a 74 y/o F w/ PMHx of HTN, CHF, COPD, and fibromyalgia who presents with abd pain and bloody drainage from cholecystostomy tube. She was last admitted on 5/20 for acute cholecystitis w/placement of cholecystostomy drainage tube as she was not a good candidate for cholecystectomy. Last evaluated in the ED on 6/19 for abd pain w/ blood in drainage bag. Choloangiogram at that time found that the cystic duct was patent and the drain was then capped and she was to be re evaluated in 24 hours by radiology to reinstitute her drainage bag. However today while at her SNF she had increased bloody drainage and was noted to be tachycardic so then came to the ED. She has has been nauseous and vomited a small "thumbsize " amt of blood yesterday. She has difficulty eating and drinking due to abd pain but has been able to tolerate po and liquids. She had dyspnea, fevers, and night sweats. Abd pain is a dull ache located diffusely across her abdomen that is worsened with movement. Her last BM was Saturday. CT of abd and pelvis in the ED revealed an acute thrombosis of the branches of the portal vein to the lateral and medial segment of the left lobe. IR was consulted and recommended holding off on anticoagulation in setting of current bleeding from cholecystostomy tube and they will see him in the morning for evaluation.  While  in the ED pt become tachycardic up to the 250's, tele revealed SVT. She was given adenosine twice with resolution of tachycardia. She then had another episode of SVT that self resolved. She was then started on an amio  gtt. She was also started on vanc and zosyn.   Hospital Course by problem list: Principal Problem:   Portal vein thrombosis Active Problems:   Essential hypertension   Morbid obesity   SVT (supraventricular tachycardia)   Atrial fibrillation with RVR   Abdominal aortic aneurysm   Hemobilia   74 yo female with recent cholecystostomy bag placed for cholecystitis here with acute portal vein thrombosis.  Acute/recent Portal vein thrombosis -in the setting of cholecystostomy placement on 04/2015 for acute choelcystitis Presented with abdominal pain, on going bleeding seen at cholecystostomy site for last few days. CT abdomen showed portal vein thrombosis seen on lateral and medial segment of left lobe. This is Likely 2/2 to recent procedure of cholecystostomy insertion but could be other causes as well  Initially had significant LFT elevation on 6/19 when presented to the ED. Then on admission she still has LFT's but down from 6/19. LFTs are trending down further. Leukocytosis also trending down, remains afebrile. and tachycardia (initially was in SVT then to Afib then now NSR after amio) likely 2/2 to this but now resolved.  - appreciate IR recs: ok with anticoag as not actively bleeding. If bleeding recurs, may need GB drain tract injection/imaging - GI consulted for recs about why this patient would have portal vein thrombosis. No evidence of malignancy, pancreatitis, or cirrhosis. They also think it's related to the GB surgery. GI Recommended AFP (3.4), hypercoag panel outpatient, and Doppler u/s in 2-3 weeks to further evaluate her hepatic/portal flow. - switched from Lovenox to Xarelto on 05/30/2015 for patient preference for NOAC over coumadin.  - Chole tube has to stay for 6 weeks. Was placed on 04/26/15. Has appt to f/up with surgery Dr. Hulen Skains on 06/03/15.   Sepsis criteria - no clear source of infection. No resp symptoms, no dysuria, does have ab tenderness likely from the portal vein  thrombosis.  BP stable. Afebrile now. - d/ced abx on 6/23 as bcx negative for 48 hours.  Afib with RVR - CHADS2 vasc 3 (htn, age, chf) likely 2/2 to sepsis or Acute Portal vein thrombosis (was initially in SVT, s/p adenosine),  Was on amio drip, converted to PO amio per cards as was in NSR. - appreciate card recs. - will switch to PO amio  - needs to be anticoagulated. Was On lovenox >> Xarelto lifetime. - repleted K+ and Mag as needed.   Hypokalemia and hypomag- replete as needed  HTN - at home on HCTZ 12.5mg  daily and nifedipine 90mg  daily.  - resume home meds as BP going up today in 150's.  CHF - last TTE 04/2015 w/ EF 55-60% with grade 1 diastolic dysfunction - euvolemic here.  COPD - o2 depended - on advair, spiriva, and albuterol daily ath ome - cont home meds  OSA - cont cpap qhs  Anxiety and depression - cont home zoloft and xanax  migraine - cont home topamax  Pre-diabetes - hgba1c 6.0 - counseled on lifestyle modification.    Discharge Vitals:   BP 142/87 mmHg  Pulse 94  Temp(Src) 97.8 F (36.6 C) (Oral)  Resp 18  Ht 5\' 3"  (1.6 m)  Wt 228 lb 2.8 oz (103.5 kg)  BMI 40.43 kg/m2  SpO2 97%  Discharge Labs:  Results for  orders placed or performed during the hospital encounter of 05/27/15 (from the past 24 hour(s))  Comprehensive metabolic panel     Status: Abnormal   Collection Time: 05/30/15  5:30 AM  Result Value Ref Range   Sodium 137 135 - 145 mmol/L   Potassium 4.0 3.5 - 5.1 mmol/L   Chloride 106 101 - 111 mmol/L   CO2 26 22 - 32 mmol/L   Glucose, Bld 105 (H) 65 - 99 mg/dL   BUN <5 (L) 6 - 20 mg/dL   Creatinine, Ser 0.58 0.44 - 1.00 mg/dL   Calcium 8.9 8.9 - 10.3 mg/dL   Total Protein 5.6 (L) 6.5 - 8.1 g/dL   Albumin 2.0 (L) 3.5 - 5.0 g/dL   AST 20 15 - 41 U/L   ALT 135 (H) 14 - 54 U/L   Alkaline Phosphatase 183 (H) 38 - 126 U/L   Total Bilirubin 1.0 0.3 - 1.2 mg/dL   GFR calc non Af Amer >60 >60 mL/min   GFR calc Af Amer >60 >60 mL/min    Anion gap 5 5 - 15  CBC     Status: Abnormal   Collection Time: 05/30/15  5:30 AM  Result Value Ref Range   WBC 12.5 (H) 4.0 - 10.5 K/uL   RBC 3.00 (L) 3.87 - 5.11 MIL/uL   Hemoglobin 9.7 (L) 12.0 - 15.0 g/dL   HCT 29.7 (L) 36.0 - 46.0 %   MCV 99.0 78.0 - 100.0 fL   MCH 32.3 26.0 - 34.0 pg   MCHC 32.7 30.0 - 36.0 g/dL   RDW 14.8 11.5 - 15.5 %   Platelets 230 150 - 400 K/uL  Magnesium     Status: None   Collection Time: 05/30/15  5:30 AM  Result Value Ref Range   Magnesium 2.0 1.7 - 2.4 mg/dL    Signed: Dellia Nims, MD 05/30/2015, 1:51 PM    Services Ordered on Discharge:  Equipment Ordered on Discharge:

## 2015-05-30 NOTE — Progress Notes (Signed)
No new complaints.  Comfortable. No significant change on exam except abdominal tenderness improved.  Has not had BM since 18th. Laxative regimen for Bm.  Discharge to SNF okay today after the patient has a BM. I have discussed this patient with Dr Naaman Plummer and Dr Genene Churn.  Madilyn Fireman MD MPH 05/30/2015 1:47 PM

## 2015-05-30 NOTE — Clinical Social Work Note (Signed)
CSW informed by Lynelle Smoke at [facility] that insurance authorization has been approved. CSW informed by MD that the pt will discharge this morning. CSW provided a handoff to the current CSW. This CSW will sign off.   [place], MSW, [place]

## 2015-05-30 NOTE — Progress Notes (Signed)
Patient ID: Jennifer [name], female   DOB: [birth year], 74 y.o.   MRN: [id number] [facility] Gastroenterology Progress Note  Jennifer [name] 74 y.o. 19-Feb-1941   Subjective: Sitting in bedside chair. Eating breakfast. Feels sore in abdomen.  Objective: Vital signs in last 24 hours: Filed Vitals:   05/30/15 0528  BP: 158/71  Pulse: 87  Temp: 97.5 F (36.4 C)  Resp: 18    Physical Exam: Gen: alert, no acute distress, elderly Abd: diffuse tenderness with guarding, nondistended, soft  Lab Results:  Recent Labs  05/28/15 1627 05/29/15 0530 05/30/15 0530  NA 134* 134* 137  K 3.0* 3.2* 4.0  CL 104 104 106  CO2 23 24 26   GLUCOSE 134* 129* 105*  BUN 8 5* <5*  CREATININE 0.55 0.57 0.58  CALCIUM 8.3* 8.6* 8.9  MG 1.7  --  2.0  PHOS  --  2.5  --     Recent Labs  05/29/15 0530 05/30/15 0530  AST 26 20  ALT 195* 135*  ALKPHOS 195* 183*  BILITOT 1.2 1.0  PROT 5.5* 5.6*  ALBUMIN 1.9* 2.0*    Recent Labs  05/27/15 1600  05/29/15 0530 05/29/15 1225 05/30/15 0530  WBC 15.5*  < > 13.1* 13.1* 12.5*  NEUTROABS 12.1*  --  10.2*  --   --   HGB 11.3*  < > 9.5* 9.9* 9.7*  HCT 33.4*  < > 28.7* 30.3* 29.7*  MCV 96.5  < > 97.6 98.4 99.0  PLT 236  < > 210 221 230  < > = values in this interval not displayed.  Recent Labs  05/29/15 1225  LABPROT 18.0*  INR 1.48      Assessment/Plan: Acute Portal Vein Thrombosis on anticoagulation. AFP normal. LFTs improving. See consult note for recs. No new recs. Will sign off. Call if questions. F/U with GI prn.   [place] C. 05/30/2015, 9:44 AM  Pager [phone]  If no answer or after 5 PM call [phone]

## 2015-05-30 NOTE — Progress Notes (Signed)
Subjective:    No acute events overnight. Patient has stable abdominal pain. She is tolerating a regular diet with no nausea/vomiting. She denies any chest pain, shortness of breath, or light-headedness. She does endorse constipation with last bowel movement about 6 days ago.    Objective:    Vital Signs:   Temp:  [97.5 F (36.4 C)-98.7 F (37.1 C)] 97.5 F (36.4 C) (06/24 0528) Pulse Rate:  [81-87] 87 (06/24 0528) Resp:  [16-18] 18 (06/24 0528) BP: (128-158)/(66-71) 158/71 mmHg (06/24 0528) SpO2:  [97 %-99 %] 98 % (06/24 0813) Last BM Date: 05/24/15  Intake/Output:   Intake/Output Summary (Last 24 hours) at 05/30/15 1227 Last data filed at 05/30/15 0919  Gross per 24 hour  Intake    480 ml  Output    800 ml  Net   -320 ml      Physical Exam: Gen: Sitting up in chair in no acute distress. Heart: Regular rate and rhythm, no murmurs/rubs/gallops Lungs: CTAB in anterior fields, no wheeze or crackles. Normal work of breathing on room air. Abdomen: Soft, nondistended. Mild diffuse tenderness to palpation. Active bowel sounds. Cholecystostomy drain in place with clean/dry/intact dressing. Chole drain site with no erythema or warmth. Bilious drainage in cholecystostomy bag with no signs of blood.  Extremities: Warm, well perfused, no pitting edema. SCDs in place.  Neuro: Alert, appropriate, no gross focal deficits  Labs:  Basic Metabolic Panel:  Recent Labs Lab 05/27/15 1600 05/28/15 0235 05/28/15 1627 05/29/15 0530 05/30/15 0530  NA 132* 132* 134* 134* 137  K 2.8* 2.9* 3.0* 3.2* 4.0  CL 97* 102 104 104 106  CO2 23 23 23 24 26   GLUCOSE 119* 122* 134* 129* 105*  BUN 13 6 8  5* <5*  CREATININE 0.68 0.59 0.55 0.57 0.58  CALCIUM 8.9 8.2* 8.3* 8.6* 8.9  MG 1.8  --  1.7  --  2.0  PHOS  --   --   --  2.5  --     Liver Function Tests:  Recent Labs Lab 05/25/15 1006 05/27/15 1600 05/28/15 0235 05/29/15 0530 05/30/15 0530  AST 1065* 143* 68* 26 20  ALT 493*  523* 334* 195* 135*  ALKPHOS 240* 249* 228* 195* 183*  BILITOT 1.6* 3.1* 2.4* 1.2 1.0  PROT 7.0 6.4* 5.2* 5.5* 5.6*  ALBUMIN 3.0* 2.5* 2.1* 1.9* 2.0*    Recent Labs Lab 05/25/15 1006 05/27/15 1600  LIPASE 14* 11*     CBC:  Recent Labs Lab 05/25/15 1006 05/27/15 1600 05/28/15 0235 05/29/15 0530 05/29/15 1225 05/30/15 0530  WBC 8.3 15.5* 14.6* 13.1* 13.1* 12.5*  NEUTROABS 7.2 12.1*  --  10.2*  --   --   HGB 12.2 11.3* 10.6* 9.5* 9.9* 9.7*  HCT 36.7 33.4* 31.4* 28.7* 30.3* 29.7*  MCV 98.4 96.5 95.7 97.6 98.4 99.0  PLT 210 236 207 210 221 230    Cardiac Enzymes:  Recent Labs Lab 05/25/15 1006  TROPONINI <0.03     Microbiology: Results for orders placed or performed during the hospital encounter of 05/27/15  MRSA PCR Screening     Status: Abnormal   Collection Time: 05/27/15 12:59 AM  Result Value Ref Range Status   MRSA by PCR POSITIVE (A) NEGATIVE Final    Comment:        The GeneXpert MRSA Assay (FDA approved for NASAL specimens only), is one component of a comprehensive MRSA colonization surveillance program. It is not intended to diagnose MRSA infection nor to guide or  monitor treatment for MRSA infections. RESULT CALLED TO, READ BACK BY AND VERIFIED WITH: W.WORK RN [phone] 05/28/15 E.GADDY   Blood Culture (routine x 2)     Status: None (Preliminary result)   Collection Time: 05/27/15  5:55 PM  Result Value Ref Range Status   Specimen Description BLOOD RIGHT HAND  Final   Special Requests BOTTLES DRAWN AEROBIC AND ANAEROBIC 5CC  Final   Culture NO GROWTH 2 DAYS  Final   Report Status PENDING  Incomplete  Blood Culture (routine x 2)     Status: None (Preliminary result)   Collection Time: 05/27/15  5:58 PM  Result Value Ref Range Status   Specimen Description BLOOD LEFT HAND  Final   Special Requests BOTTLES DRAWN AEROBIC ONLY 2CC  Final   Culture NO GROWTH 2 DAYS  Final   Report Status PENDING  Incomplete    Coagulation Studies:  Recent  Labs  05/29/15 1225  LABPROT 18.0*  INR 1.48     Medications:    Infusions: None    Scheduled Medications: . amiodarone  200 mg Oral BID  . Chlorhexidine Gluconate Cloth  6 each Topical Q0600  . mometasone-formoterol  2 puff Inhalation BID  . mupirocin ointment  1 application Nasal BID  . potassium chloride  40 mEq Oral BID  . rivaroxaban  15 mg Oral BID WC   Followed by  . [START ON 06/19/2015] rivaroxaban  20 mg Oral Q supper  . sertraline  100 mg Oral BID  . tiotropium  18 mcg Inhalation Daily  . topiramate  50 mg Oral BID    PRN Medications: albuterol, ALPRAZolam, HYDROcodone-acetaminophen   Assessment/ Plan:    Patient is a 74 year old woman with history of HTN, COPD, and acute cholecystitis in May 2016 who presented with abdominal pain with bloody drainage from cholecystotomy tube, found to have acute portal vein thrombosis.  Principal Problem:   Portal vein thrombosis Active Problems:   Essential hypertension   Morbid obesity   SVT (supraventricular tachycardia)   Atrial fibrillation with RVR   Abdominal aortic aneurysm   Hemobilia  Acute portal vein thrombosis: Improved abdominal pain, and WBC continue to downtrend. Acute PVT likely secondary to recent cholecystostomy procedure. CT on admission revealed acute thrombosis of the portal vein branches to the lateral and medial segment of the left lobe. Patient is s/p cholecystostomy placement by IR on 04/26/15 and reported bloody drainage from chole tube (see below). No history of thromboembolic complications, although patient has paroxysmal Afib. AFP within normal limits.  -Appreciate GI recommendations for further workup of acute portal vein thrombosis -Switched from Lovenox to Xarelto on 6/24 Continue Xarelto 15 mg BID for 21 days, then 20 mg daily -Per GI, pt needs doppler ultrasound in 2-3 weeks to further evaluate hepatic and portal flow -Consider outpatient hypercoagulation workup per GI -Appreciate IR  recommendations and management of cholecystostomy complications -Norco 9-604 2 tabs q6h prn pain  S/p acute cholecystitis, possible bloody drainage from cholecystotomy tube: No signs of bloody drainage from cholecystostomy tube. However, Hgb continues to drop 12.2>>10.3>>9. 5 today. Likely secondary to recent acute bleed, as patient reported 5 days of bloody chole tube drainage on admission. Fluoroscopic injection of cholecystostomy on 05/25/15 revealed stable position of chole tube and blood clots in gallbladder. CT abd/pelvis with no evidence of intra-abdominal bleed.  -Appreciate IR recommendations and management of cholecystostomy complications -Pt may need imaging of gallbladder drain tract if bleeding recurs -Patient needs cholecystostomy for 6 weeks (  placed on 04/26/15). Patient to follow up with Dr. Hulen Skains from general surgery on 06/03/15  Atrial fibrillation with RVR: Patient maintaining normal sinus rhythm on amiodarone PO. She has history of paroxysmal Afib with first episode during last admission in May 2016. She also had SVT on admission, resolved with adenosine. Acute illness and hypokalemia are likely provoking factors. CHADS2-VASC score of 3 (HTN, age, CHF), not on anticoagulation at home. -Appreciate Cardiology recommendations -Continue amiodarone 200 mg PO BID  -Switched from Lovenox to Xarelto as above. Continue Xarelto 15 mg BID for 21 days, then 20 mg daily -TSH within normal limits -Check PFTs with DLCO as outpatient  -Treat portal vein thrombosis as above  Sepsis: Presented with 3/4 SIRS criteria (HR 154, RR 25, WBC 15.5) with no signs of infection on CXR. SIRS signs may be secondary to acute portal vein thrombosis rather than infection. No infectious source identified on CXR, CT abd/pelvis, or urinalysis. Patient now in normal sinus rhythm with no signs of sepsis. -Blood cultures from 05/27/15 - negative for growth x 48 hours -Discontinued vanc and Zosyn   HTN: Soft BPs on  admission, now slightly hypertensive with systolic BPs in 791T. -Restart home HCTZ 12.5 mg daily and nifedipine 90 mg daily at discharge  Hypokalemia: Resolved with potassium 4.0 this AM.  -Monitor with daily CMP and replete PRN  Diastolic heart failure: Last TTE 04/26/2015 w/ EF of 55-60% and grade 1 diastolic dysfunction. No active issues.   COPD: At home, patient on Advair 1 puff bid, Spiriva 18 mch inh daily, and albuterol 1-2 puff q6hprn - Continue on Dulera1 puff bid, Spiriva 18 mch inh daily, and albuterol 1-2 puff q6h prn wheezing or dyspnea  Fibromyalgia- At home patient on norco 7.5-325mg  QID prn, amitriptyline 50mg  qhs, carisoprodol 350mg  qd prn -Continue Norco 5-325 2 tabs q6h prn pain -Hold home amitriptyline and carisoprodol   OSA: No active issues - Continue CPAP nightly  Anxiety and depression: No active issues. - Continue home zoloft 100mg  BID and xanax 0.5mg  BID oprn  Migraines: No active issues. - Continue home Topamax 50 mg BID  Pre-diabetes: HbA1c 6.0 during admission. - Counseled on life-style modifications - Heart healthy diet   FEN - Diet: Heart-healthy - Electrolytes: Monitor and replete PRN. Replete potassium as above.  - IVFs: None  DVT ppx: Therapeutic Lovenox for portal vein thrombosis  CODE: FULL  Dispo: Discharge to SNF anticipated today.   The patient does have a current PCP Bertha Stakes, MD) and does need an [facility] hospital follow-up appointment after discharge.  The patient does not have transportation limitations that hinder transportation to clinic appointments.   SERVICE NEEDED AT [place]         Y = Yes, Blank = No PT:   OT:   RN:   Equipment:   Other:      Length of Stay: 3 day(s)   Signed: Betsey Amen, MS4

## 2015-05-30 NOTE — Care Management Note (Signed)
Case Management Note  Patient Details  Name: Jennifer [name] MRN: [id number] Date of Birth: 1941/08/28  Subjective/Objective:      Patient for dc to [facility] SNF today.              Action/Plan:   Expected Discharge Date:                  Expected Discharge Plan:  [place] (Pt is from [place])  In-House Referral:  Clinical Social Work  Discharge planning Services  CM Consult  Post Acute Care Choice:    Choice offered to:     DME Arranged:    DME Agency:     HH Arranged:    [place] Agency:     Status of Service:  Completed, signed off  Medicare Important Message Given:  Yes Date Medicare IM Given:  05/30/15 Medicare IM give by:  Tomi Bamberger RN Date Additional Medicare IM Given:    Additional Medicare Important Message give by:     If discussed at [place] of Stay Meetings, dates discussed:    Additional Comments:  Zenon Mayo, RN 05/30/2015, 6:08 PM

## 2015-05-30 NOTE — Clinical Social Work Note (Signed)
Per MD patient ready to DC back to [facility]. RN, patient/family, and facility notified of patient's DC. RN given number for report. DC packet on patient's chart. Ambulance transport will be requested for patient by RN once the patient has passed a BM. CSW signing off at this time.   Liz Beach MSW, [place], [place], [phone]

## 2015-06-01 LAB — CULTURE, BLOOD (ROUTINE X 2)
Culture: NO GROWTH
Culture: NO GROWTH

## 2015-06-02 ENCOUNTER — Encounter (HOSPITAL_COMMUNITY): Payer: Self-pay | Admitting: Emergency Medicine

## 2015-06-02 ENCOUNTER — Emergency Department (HOSPITAL_COMMUNITY): Payer: Medicare HMO

## 2015-06-02 ENCOUNTER — Inpatient Hospital Stay (HOSPITAL_COMMUNITY)
Admission: EM | Admit: 2015-06-02 | Discharge: 2015-06-10 | DRG: 908 | Disposition: A | Discharge status: Skilled Nursing Facility | Payer: Medicare HMO | Attending: Internal Medicine | Admitting: Internal Medicine

## 2015-06-02 ENCOUNTER — Non-Acute Institutional Stay (SKILLED_NURSING_FACILITY): Payer: Medicare HMO | Admitting: Adult Health

## 2015-06-02 DIAGNOSIS — K819 Cholecystitis, unspecified: Secondary | ICD-10-CM

## 2015-06-02 DIAGNOSIS — B965 Pseudomonas (aeruginosa) (mallei) (pseudomallei) as the cause of diseases classified elsewhere: Secondary | ICD-10-CM | POA: Diagnosis present

## 2015-06-02 DIAGNOSIS — K831 Obstruction of bile duct: Secondary | ICD-10-CM | POA: Insufficient documentation

## 2015-06-02 DIAGNOSIS — I4891 Unspecified atrial fibrillation: Secondary | ICD-10-CM

## 2015-06-02 DIAGNOSIS — Z9981 Dependence on supplemental oxygen: Secondary | ICD-10-CM

## 2015-06-02 DIAGNOSIS — J439 Emphysema, unspecified: Secondary | ICD-10-CM

## 2015-06-02 DIAGNOSIS — K91840 Postprocedural hemorrhage and hematoma of a digestive system organ or structure following a digestive system procedure: Principal | ICD-10-CM | POA: Diagnosis present

## 2015-06-02 DIAGNOSIS — I10 Essential (primary) hypertension: Secondary | ICD-10-CM | POA: Diagnosis present

## 2015-06-02 DIAGNOSIS — T45515A Adverse effect of anticoagulants, initial encounter: Secondary | ICD-10-CM | POA: Diagnosis present

## 2015-06-02 DIAGNOSIS — E876 Hypokalemia: Secondary | ICD-10-CM | POA: Diagnosis not present

## 2015-06-02 DIAGNOSIS — F329 Major depressive disorder, single episode, unspecified: Secondary | ICD-10-CM

## 2015-06-02 DIAGNOSIS — R519 Headache, unspecified: Secondary | ICD-10-CM

## 2015-06-02 DIAGNOSIS — T8579XA Infection and inflammatory reaction due to other internal prosthetic devices, implants and grafts, initial encounter: Secondary | ICD-10-CM | POA: Diagnosis present

## 2015-06-02 DIAGNOSIS — R42 Dizziness and giddiness: Secondary | ICD-10-CM | POA: Diagnosis not present

## 2015-06-02 DIAGNOSIS — F411 Generalized anxiety disorder: Secondary | ICD-10-CM | POA: Diagnosis present

## 2015-06-02 DIAGNOSIS — R1011 Right upper quadrant pain: Secondary | ICD-10-CM

## 2015-06-02 DIAGNOSIS — K219 Gastro-esophageal reflux disease without esophagitis: Secondary | ICD-10-CM | POA: Diagnosis not present

## 2015-06-02 DIAGNOSIS — I5030 Unspecified diastolic (congestive) heart failure: Secondary | ICD-10-CM | POA: Diagnosis present

## 2015-06-02 DIAGNOSIS — T819XXA Unspecified complication of procedure, initial encounter: Secondary | ICD-10-CM | POA: Diagnosis present

## 2015-06-02 DIAGNOSIS — K810 Acute cholecystitis: Secondary | ICD-10-CM

## 2015-06-02 DIAGNOSIS — Z79899 Other long term (current) drug therapy: Secondary | ICD-10-CM

## 2015-06-02 DIAGNOSIS — R51 Headache: Secondary | ICD-10-CM

## 2015-06-02 DIAGNOSIS — M797 Fibromyalgia: Secondary | ICD-10-CM

## 2015-06-02 DIAGNOSIS — E669 Obesity, unspecified: Secondary | ICD-10-CM | POA: Diagnosis present

## 2015-06-02 DIAGNOSIS — I471 Supraventricular tachycardia: Secondary | ICD-10-CM | POA: Diagnosis not present

## 2015-06-02 DIAGNOSIS — B952 Enterococcus as the cause of diseases classified elsewhere: Secondary | ICD-10-CM | POA: Diagnosis present

## 2015-06-02 DIAGNOSIS — G4733 Obstructive sleep apnea (adult) (pediatric): Secondary | ICD-10-CM | POA: Diagnosis present

## 2015-06-02 DIAGNOSIS — I480 Paroxysmal atrial fibrillation: Secondary | ICD-10-CM | POA: Diagnosis present

## 2015-06-02 DIAGNOSIS — E785 Hyperlipidemia, unspecified: Secondary | ICD-10-CM | POA: Diagnosis present

## 2015-06-02 DIAGNOSIS — F32A Depression, unspecified: Secondary | ICD-10-CM

## 2015-06-02 DIAGNOSIS — R109 Unspecified abdominal pain: Secondary | ICD-10-CM

## 2015-06-02 DIAGNOSIS — I5032 Chronic diastolic (congestive) heart failure: Secondary | ICD-10-CM | POA: Diagnosis present

## 2015-06-02 DIAGNOSIS — T819XXD Unspecified complication of procedure, subsequent encounter: Secondary | ICD-10-CM

## 2015-06-02 DIAGNOSIS — Z87891 Personal history of nicotine dependence: Secondary | ICD-10-CM

## 2015-06-02 DIAGNOSIS — Z86718 Personal history of other venous thrombosis and embolism: Secondary | ICD-10-CM

## 2015-06-02 DIAGNOSIS — H8109 Meniere's disease, unspecified ear: Secondary | ICD-10-CM | POA: Diagnosis present

## 2015-06-02 DIAGNOSIS — I820 Budd-Chiari syndrome: Secondary | ICD-10-CM | POA: Diagnosis not present

## 2015-06-02 DIAGNOSIS — R7309 Other abnormal glucose: Secondary | ICD-10-CM | POA: Diagnosis present

## 2015-06-02 DIAGNOSIS — Z6841 Body Mass Index (BMI) 40.0 and over, adult: Secondary | ICD-10-CM

## 2015-06-02 DIAGNOSIS — Z7951 Long term (current) use of inhaled steroids: Secondary | ICD-10-CM

## 2015-06-02 DIAGNOSIS — K8011 Calculus of gallbladder with chronic cholecystitis with obstruction: Secondary | ICD-10-CM | POA: Diagnosis present

## 2015-06-02 DIAGNOSIS — Z7901 Long term (current) use of anticoagulants: Secondary | ICD-10-CM

## 2015-06-02 DIAGNOSIS — Z7982 Long term (current) use of aspirin: Secondary | ICD-10-CM

## 2015-06-02 DIAGNOSIS — E871 Hypo-osmolality and hyponatremia: Secondary | ICD-10-CM | POA: Diagnosis present

## 2015-06-02 DIAGNOSIS — G43909 Migraine, unspecified, not intractable, without status migrainosus: Secondary | ICD-10-CM | POA: Diagnosis present

## 2015-06-02 DIAGNOSIS — R7303 Prediabetes: Secondary | ICD-10-CM | POA: Diagnosis present

## 2015-06-02 MED ORDER — IOHEXOL 300 MG/ML  SOLN
50.0000 mL | Freq: Once | INTRAMUSCULAR | Status: AC | PRN
  Administered 2015-06-02: 10 mL via ORAL

## 2015-06-02 MED ORDER — ONDANSETRON HCL 4 MG/2ML IJ SOLN
4.0000 mg | Freq: Once | INTRAMUSCULAR | Status: AC
  Administered 2015-06-03: 4 mg via INTRAVENOUS
  Filled 2015-06-02: qty 2

## 2015-06-02 MED ORDER — FENTANYL CITRATE (PF) 100 MCG/2ML IJ SOLN
50.0000 ug | INTRAMUSCULAR | Status: DC | PRN
  Administered 2015-06-03 (×3): 50 ug via INTRAVENOUS
  Filled 2015-06-02 (×3): qty 2

## 2015-06-02 NOTE — Progress Notes (Signed)
Patient ID: Jennifer [name], female   DOB: 1941/06/24, 74 y.o.   MRN: [id number]  Jennifer [name] living [place]     Allergies  Allergen Reactions  . Tetracycline Other (See Comments)    REACTION: stomach upset  . Flonase [Fluticasone] Cough  . Lisinopril Cough       Chief Complaint  Patient presents with  . Hospitalization Follow-up    HPI:  She has been hospitalized for portal vein thrombus with history of afib. She has been placed on xarelto long term therapy. She is due to see Dr. Michail Sermon on 06-03-15: for follow up and possible removal of her chole drain. She is here for short term rehab with her go to return back home.   Past Medical History  Diagnosis Date  . COPD (chronic obstructive pulmonary disease)     O2 dependent. Followed by Dr. Joya Gaskins  . Fibromyalgia   . Hypertension   . OSA (obstructive sleep apnea)     Sleep study done April 02, 2008 showed severe obstructive sleep apnea.  . Diastolic dysfunction     Grade I diastolic dysfunction as shown on ECHO Dec 2011.  EF of 65-70%.  Marland Kitchen COPD (chronic obstructive pulmonary disease)     Followed by Dr. Joya Gaskins  . Dyslipidemia   . Osteopenia     DXA scan done on 10/23/2009 showed osteopenia of AP spine (young adult T-score -1.3), osteopenia of left femur neck (young adult T-score -1.8), and normal bone density of right femur neck (young adult T-score -0.8), with a FRAX estimate of the 10-year probability of a major osteoporotic fracture of 9.7% and probability of hip fracture 1.6%.  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 Estanislado Pandy on 11/10/2004.  MRA of the head on 03/09/2012 showed no evidence for recanalization.  . Osteoarthrosis involving multiple sites   . Chronic back pain   . Depression   . Anxiety   . Anemia   . GERD (gastroesophageal reflux disease)   . Meniere disease     Followed by ENT Dr. Silvestre Moment  . Diverticulosis of colon   . Multiple  pigmented nevi   . Hypokalemia   . S/P hysterectomy 1975  . Pneumonia 11/2004  . Knee pain   . Dysphagia   . Urosepsis 11/04/2010    Klebsiella pneumoniae  . Breast pain   . Allergic rhinitis   . Fibromyalgia   . 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     . Urinary incontinence     Past Surgical History  Procedure Laterality Date  . Abdominal hysterectomy  1975  . 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 Estanislado Pandy on 11/10/2004.    VITAL SIGNS BP 120/67 mmHg  Pulse 92  Ht $R'5\' 3"'SY$  (1.6 m)  Wt 224 lb (101.606 kg)  BMI 39.69 kg/m2   Outpatient Encounter Prescriptions as of 06/02/2015  Medication Sig  . albuterol (PROAIR HFA) 108 (90 BASE) MCG/ACT inhaler nhale 1-2 puffs into the lungs every 6 (six) hours as needed for wheezing or shortness of breath. )  . ALPRAZolam (XANAX) 0.5 MG tablet Take 1 tablet (0.5 mg total) by mouth 3 (three) times daily as  needed for anxiety.  Marland Kitchen amiodarone (PACERONE) 200 MG tablet Take 1 tablet (200 mg total) by mouth 2 (two) times daily.  Marland Kitchen amitriptyline (ELAVIL) 50 MG tablet Take 1 tablet (50 mg total) by mouth at bedtime.  Marland Kitchen aspirin 81 MG EC tablet Take 81 mg by mouth daily.    . carisoprodol (SOMA) 350 MG tablet Take 1 tablet (350 mg total) by mouth daily as needed for muscle spasms.  . Cholecalciferol (VITAMIN D3) 1000 UNITS CAPS Take 1 capsule (1,000 Units total) by mouth daily.  Marland Kitchen CINNAMON PO Take 1 tablet by mouth 2 (two) times daily.   Marland Kitchen dextromethorphan-guaiFENesin (MUCINEX DM) 30-600 MG per 12 hr tablet Take 1 tablet by mouth 2 (two) times daily.  Marland Kitchen docusate sodium (COLACE) 100 MG capsule Take 100 mg by mouth daily as needed for mild  constipation or moderate constipation.  . Fluticasone-Salmeterol (ADVAIR DISKUS) 250-50 MCG/DOSE AEPB Inhale 1 puff into the lungs 2 (two) times daily.  . hydrochlorothiazide (HYDRODIURIL) 25 MG tablet Take 0.5 tablets (12.5 mg total) by mouth daily.  Marland Kitchen HYDROcodone-acetaminophen (NORCO/VICODIN) 5-325 MG per tablet Take 1-2 tablets by mouth every 4 (four) hours as needed for moderate pain.  . hydroxypropyl methylcellulose (ISOPTO TEARS) 2.5 % ophthalmic solution Place 2 drops into both eyes 3 (three) times daily as needed. Dry eye  . meclizine (ANTIVERT) 12.5 MG tablet Take 1 tablet (12.5 mg total) by mouth 2 (two) times daily as needed for dizziness.  . Multiple Vitamin (MULTIVITAMIN) tablet Take 1 tablet by mouth daily.  . mupirocin ointment (BACTROBAN) 2 % Place 1 application into the nose 2 (two) times daily.  Marland Kitchen NIFEdipine (ADALAT CC) 90 MG 24 hr tablet Take 1 tablet (90 mg total) by mouth daily.  Marland Kitchen omeprazole (PRILOSEC) 20 MG capsule Take 1 capsule (20 mg total) by mouth 2 (two) times daily.  . potassium chloride SA (K-DUR,KLOR-CON) 20 MEQ tablet Take 20 mEq by mouth 2 (two) times daily.  . ranitidine (ZANTAC) 150 MG tablet Take 1 tablet (150 mg total) by mouth at bedtime.  . Red Yeast Rice Extract (RED YEAST RICE PO) Take 1 tablet by mouth 2 (two) times daily.   . Rivaroxaban (XARELTO) 15 MG TABS tablet Take 1 tablet (15 mg total) by mouth 2 (two) times daily with a meal.  . [START ON 06/19/2015] rivaroxaban (XARELTO) 20 MG TABS tablet Take 1 tablet (20 mg total) by mouth daily with supper.  . sertraline (ZOLOFT) 100 MG tablet Take 1 tablet (100 mg total) by mouth 2 (two) times daily.  Marland Kitchen tiotropium (SPIRIVA HANDIHALER) 18 MCG inhalation capsule Place 1 capsule (18 mcg total) into inhaler and inhale daily.  Marland Kitchen topiramate (TOPAMAX) 50 MG tablet Take 1 tablet (50 mg total) by mouth 2 (two) times daily.      SIGNIFICANT DIAGNOSTIC EXAMS  04-26-15: 2-d echo:  - Left ventricle: The cavity size  was normal. Wall thickness was normal. Systolic function was normal. The estimated ejection fraction was in the range of 55% to 60%. Wall motion was normal; there were no regional wall motion abnormalities. Doppler parameters are consistent with abnormal left ventricular relaxation (grade 1 diastolic dysfunction). - Ascending aorta: The ascending aorta was mildly dilated. - Left atrium: The atrium was mildly dilated.  05-25-15: chest x-ray; No acute cardiopulmonary abnormality seen.   05-27-15: cholangiogram: The cystic duct is patent. Blood clots are present in the gallbladder. Recommendations are for drain capping and resuming bag drainage tomorrow.  05-27-15: chest x-ray: Low  lung volumes without focal disease. Difficult to exclude vascular congestion.  05-27-15: ct of abdomen and pelvis: 1. Interval percutaneous cholecystostomy with decompression of the gallbladder. There is no evidence for biliary obstruction. 2. Small fluid collection ventral to the right lobe of liver is identified. This is mixed attenuation and may represent a small biloma and/or small hematoma. No large hematoma is identified at this time. 3. 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 4. Aortic atherosclerosis 5. Infrarenal abdominal aortic aneurysm. Recommend followup by ultrasound in 2 years.     LABS REVIEWED:   05-25-15: wbc 8.3; hgb 12.2; hct 36.7; mcv 98.4; plt 210; glucose 202; bun 10; creat 0.58; k+3.4; na++136; ast 1065; alt 493; alk phos 240; t bili 1.6; albumin 3.0 05-27-15: wbc 15.5; hgb 11.3; hct 33.4.; mcv 96.5; plt 236; glucose 119; bun 13; creat 0.68; k+2.8; na++ 132; mag 1.8; ast 143; alt 523; alk phos 249; t bili 3.1; albumin 2.5 05-29-15: wbc 13.3; hgb 9.5; hct 28.7; mcv 97.6; plt 210; glucose 129; bun 5; creat 0.57; k+3.2;  na++ 134; ast 26; alt 195; alk phos 195; t bili 1.2; albumin 1.9; phos 2.5; hgb a1c 6.0; HIV; nr;   tsh  2.895 AFP rumor 3.4  05-30-15: wbc 12.5; hgb 9.7; hct 29.7; mcv 99; plt 230; glucose 105; bun <5; creat 0.58; k+4.0; na++137; ast 20; alt 135; alk phos 183; t bili 1.0; albumin 2.0; mag 2.0      Review of Systems  Constitutional: Negative for malaise/fatigue.  Respiratory: Negative for cough and shortness of breath.   Cardiovascular: Negative for chest pain, palpitations and leg swelling.  Gastrointestinal: Negative for heartburn, abdominal pain, diarrhea and constipation.  Musculoskeletal: Negative for myalgias and joint pain.  Skin: Negative.   Psychiatric/Behavioral: The patient is not nervous/anxious.      Physical Exam  Constitutional: She is oriented to person, place, and time. No distress.  Obese   Neck: Neck supple. No JVD present. No thyromegaly present.  Cardiovascular: Normal rate, regular rhythm and intact distal pulses.   Respiratory: Effort normal and breath sounds normal. No respiratory distress.  GI: Soft. Bowel sounds are normal. She exhibits no distension. There is no tenderness.  Chole drain present   Musculoskeletal: She exhibits no edema.   Is able to move all extremities   Neurological: She is alert and oriented to person, place, and time.  Skin: Skin is warm and dry. She is not diaphoretic.       ASSESSMENT/ PLAN:  1. Portal vein thrombus: is on xarelto 15 mg twice daily for 3 weeks then 20 mg daily will continue to monitor her status   2. Afib: her rate is stable will continue amiodarone 200 mg twice daily asa 81 mg daily and xarelto daily will monitor her status.   3. Hypertension: will continue hctz 12.5 mg daily; procardia xl 90 mg daily and will monitor   4. COPD: will continue spiriva daily; advair 250/50 twice daily; albuterol 1-2 puffs every 6 hours as needed; mucinex dm twice daily  5. Hypokalemia: will continue k+ 20 meq twice daily  6. Jerrye Bushy: will continue prilosec 20 mg twice daily with zantac 150 mg nightly   7. Fibromyalgia: will  continue elavil 50 mg nightly;   8. Chronic headaches: will continue topamax 50 mg twice daily   9. Depression with anxiety: will continue zoloft 100 mg twice daily and xanax 0.25 mg  three times daily as needed   10. Acute cholecystitis: has a chole drain in place is due to see Dr. Hulen Skains for assessment of drain removal on 06-03-15.    Time spent with patient 50 minutes.      Ok Edwards NP [facility]  Contact [phone] Monday through Friday 8am- 5pm  After hours call [phone]

## 2015-06-02 NOTE — ED Notes (Signed)
Patient reports blood in drainage tube that's "not supposed to be there".  Patient states she had the tube placed 1 month ago on May 27 due to gallbladder issues.  Patient currently reports epigastric pain. Reports abdominal pain which began tonight.  Reports she is unsure how long the issues with her drainage tube have been going on.  Patient reports also "clammy feeling" and "breaking out into sweats".

## 2015-06-02 NOTE — ED Provider Notes (Signed)
CSN: [id number]     Arrival date & time 06/02/15  2123 History   First MD Initiated Contact with Patient 06/02/15 2310     Chief Complaint  Patient presents with  . Abdominal Pain     (Consider location/radiation/quality/duration/timing/severity/associated sxs/prior Treatment) HPI  This is a 74 year old female with multiple medical problems. She had a biliary drain placed in May of this year for acute cholecystitis because she was not deemed to be a good candidate for cholecystectomy. She returned to the ED on the 21st of this month with complaints of abdominal pain, nausea, vomiting and bloody drainage from her biliary drain. She was found to have portal vein thrombosis and was started on Xarelto.   Her bag was draining bile earlier today but after dinner this evening it began to pass blood and she developed abdominal pain. The abdominal pain is generalized but more prominent in the lower abdomen. She rates it a 7 out of 10. It is worse with movement or palpation. She has nausea but has not been vomiting. She has not been febrile.  Past Medical History  Diagnosis Date  . COPD (chronic obstructive pulmonary disease)     O2 dependent. Followed by Dr. Joya Gaskins  . Fibromyalgia   . Hypertension   . OSA (obstructive sleep apnea)     Sleep study done April 02, 2008 showed severe obstructive sleep apnea.  . Diastolic dysfunction     Grade I diastolic dysfunction as shown on ECHO Dec 2011.  EF of 65-70%.  Marland Kitchen COPD (chronic obstructive pulmonary disease)     Followed by Dr. Joya Gaskins  . Dyslipidemia   . Osteopenia     DXA scan done on 10/23/2009 showed osteopenia of AP spine (young adult T-score -1.3), osteopenia of left femur neck (young adult T-score -1.8), and normal bone density of right femur neck (young adult T-score -0.8), with a FRAX estimate of the 10-year probability of a major osteoporotic fracture of 9.7% and probability of hip fracture 1.6%.  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 Estanislado Pandy on 11/10/2004.  MRA of the head on 03/09/2012 showed no evidence for recanalization.  . Osteoarthrosis involving multiple sites   . Chronic back pain   . Depression   . Anxiety   . Anemia   . GERD (gastroesophageal reflux disease)   . Meniere disease     Followed by ENT Dr. Silvestre Moment  . Diverticulosis of colon   . Multiple pigmented nevi   . Hypokalemia   . S/P hysterectomy 1975  . Pneumonia 11/2004  . Knee pain   . Dysphagia   . Urosepsis 11/04/2010    Klebsiella pneumoniae  . Breast pain   . Allergic rhinitis   . Fibromyalgia   . 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     . Urinary incontinence    Past Surgical History  Procedure Laterality Date  . Abdominal hysterectomy  1975  . 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 Estanislado Pandy on 11/10/2004.   Family History  Problem Relation Age of Onset  . Ulcers Mother   . Ovarian cancer Sister 75  . Cervical cancer Daughter   .  Cervical cancer Daughter   . Lung cancer Neg Hx   . Colon cancer Neg Hx   . Heart attack Father   . Cerebral aneurysm Sister 32   History  Substance Use Topics  . Smoking status: Former Smoker -- 0.50 packs/day for 30 years    Types: Cigarettes    Quit date: 12/07/2008  . Smokeless tobacco: Former Systems developer    Types: [place] date: 12/06/1978  . Alcohol Use: No   OB History    No data available     Review of Systems  All other systems reviewed and are negative.   Allergies  Tetracycline; Flonase; and Lisinopril  Home Medications   Prior to Admission medications   Medication Sig Start Date End Date  Taking? Authorizing Provider  albuterol (PROAIR HFA) 108 (90 BASE) MCG/ACT inhaler INHALE 2 PUFFS BY MOUTH INTO LUNGS FOUR TIMES DAILY AS NEEDED Patient taking differently: Inhale 2 puffs into the lungs every 6 (six) hours as needed for wheezing or shortness of breath.  10/14/14  Yes Elsie Stain, MD  ALPRAZolam Duanne Moron) 0.5 MG tablet Take 1 tablet (0.5 mg total) by mouth 3 (three) times daily as needed for anxiety. 05/30/15  Yes Tasrif Ahmed, MD  amiodarone (PACERONE) 200 MG tablet Take 1 tablet (200 mg total) by mouth 2 (two) times daily. 05/30/15  Yes Tasrif Ahmed, MD  amitriptyline (ELAVIL) 50 MG tablet Take 1 tablet (50 mg total) by mouth at bedtime. 01/03/15  Yes Bertha Stakes, MD  aspirin 81 MG EC tablet Take 81 mg by mouth daily.     Yes Historical Provider, MD  carisoprodol (SOMA) 350 MG tablet Take 1 tablet (350 mg total) by mouth daily as needed for muscle spasms. 01/03/15  Yes Bertha Stakes, MD  Cholecalciferol (VITAMIN D3) 1000 UNITS CAPS Take 1 capsule (1,000 Units total) by mouth daily. 01/23/15  Yes Bertha Stakes, MD  CINNAMON PO Take 1 tablet by mouth 2 (two) times daily.    Yes Historical Provider, MD  dextromethorphan-guaiFENesin (MUCINEX DM) 30-600 MG per 12 hr tablet Take 1 tablet by mouth 2 (two) times daily.   Yes Historical Provider, MD  docusate sodium (COLACE) 100 MG capsule Take 100 mg by mouth daily as needed for mild constipation or moderate constipation.   Yes Historical Provider, MD  Fluticasone-Salmeterol (ADVAIR DISKUS) 250-50 MCG/DOSE AEPB Inhale 1 puff into the lungs 2 (two) times daily. 10/14/14  Yes Elsie Stain, MD  hydrochlorothiazide (HYDRODIURIL) 25 MG tablet Take 0.5 tablets (12.5 mg total) by mouth daily. 03/07/14  Yes Bertha Stakes, MD  HYDROcodone-acetaminophen (NORCO/VICODIN) 5-325 MG per tablet Take 1-2 tablets by mouth every 4 (four) hours as needed for moderate pain. Patient taking differently: Take 2 tablets by mouth every 4 (four) hours as needed for  moderate pain.  05/30/15  Yes Tasrif Ahmed, MD  hydroxypropyl methylcellulose (ISOPTO TEARS) 2.5 % ophthalmic solution Place 2 drops into both eyes 3 (three) times daily as needed. Dry eye   Yes Historical Provider, MD  ibuprofen (ADVIL,MOTRIN) 200 MG tablet Take 200 mg by mouth every 6 (six) hours as needed (for moderate pain).   Yes Historical Provider, MD  meclizine (ANTIVERT) 12.5 MG tablet Take 1 tablet (12.5 mg total) by mouth 2 (two) times daily as needed for dizziness. 11/20/14  Yes Bertha Stakes, MD  Multiple Vitamin (MULTIVITAMIN) tablet Take 1 tablet by mouth daily.   Yes Historical Provider, MD  NIFEdipine (ADALAT CC) 90 MG 24 hr tablet Take  1 tablet (90 mg total) by mouth daily. 09/06/14  Yes Bertha Stakes, MD  omeprazole (PRILOSEC) 20 MG capsule Take 1 capsule (20 mg total) by mouth 2 (two) times daily. 05/06/15  Yes Oval Linsey, MD  potassium chloride SA (K-DUR,KLOR-CON) 20 MEQ tablet Take 2 tablets (40 mEq total) by mouth 2 (two) times daily. Patient taking differently: Take 20 mEq by mouth 2 (two) times daily.  05/30/15  Yes Tasrif Ahmed, MD  Red Yeast Rice Extract (RED YEAST RICE PO) Take 1 tablet by mouth 2 (two) times daily.    Yes Historical Provider, MD  Rivaroxaban (XARELTO) 15 MG TABS tablet Take 1 tablet (15 mg total) by mouth 2 (two) times daily with a meal. 05/30/15  Yes Tasrif Ahmed, MD  sertraline (ZOLOFT) 100 MG tablet Take 1 tablet (100 mg total) by mouth 2 (two) times daily. 09/06/14  Yes Bertha Stakes, MD  tiotropium (SPIRIVA HANDIHALER) 18 MCG inhalation capsule Place 1 capsule (18 mcg total) into inhaler and inhale daily. 10/14/14  Yes Elsie Stain, MD  topiramate (TOPAMAX) 50 MG tablet Take 1 tablet (50 mg total) by mouth 2 (two) times daily. 04/02/15  Yes Bertha Stakes, MD  mupirocin ointment (BACTROBAN) 2 % Place 1 application into the nose 2 (two) times daily. Patient not taking: Reported on 06/02/2015 05/30/15   Dellia Nims, MD  ranitidine (ZANTAC) 150 MG tablet Take  1 tablet (150 mg total) by mouth at bedtime. Patient not taking: Reported on 06/02/2015 03/05/15   Bertha Stakes, MD  rivaroxaban (XARELTO) 20 MG TABS tablet Take 1 tablet (20 mg total) by mouth daily with supper. 06/19/15   Tasrif Ahmed, MD   BP 179/90 mmHg  Pulse 93  Temp(Src) 98.4 F (36.9 C) (Oral)  Resp 23  Ht 5\' 3"  (1.6 m)  Wt 222 lb (100.699 kg)  BMI 39.34 kg/m2  SpO2 94%   Physical Exam  General: Well-developed, well-nourished female in no acute distress; appearance consistent with age of record HENT: normocephalic; atraumatic Eyes: pupils equal, round and reactive to light; extraocular muscles intact Neck: supple Heart: regular rate and rhythm Lungs: clear to auscultation bilaterally Abdomen: soft; nondistended; diffuse tenderness most prominent in the left lower quadrant; biliary drain in right upper quadrant draining dark sanguinous fluid; bowel sounds present Extremities: No deformity; full range of motion; pulses normal Neurologic: Awake, alert and oriented; motor function intact in all extremities and symmetric; no facial droop; hard of hearing Skin: Warm and dry Psychiatric: Normal mood and affect    ED Course  Procedures (including critical care time)   MDM  Nursing notes and vitals signs, including pulse oximetry, reviewed.  Summary of this visit's results, reviewed by myself:  Labs:  Results for orders placed or performed during the hospital encounter of 06/02/15 (from the past 24 hour(s))  Comprehensive metabolic panel     Status: Abnormal   Collection Time: 06/02/15 11:51 PM  Result Value Ref Range   Sodium 133 (L) 135 - 145 mmol/L   Potassium 3.9 3.5 - 5.1 mmol/L   Chloride 100 (L) 101 - 111 mmol/L   CO2 25 22 - 32 mmol/L   Glucose, Bld 188 (H) 65 - 99 mg/dL   BUN 12 6 - 20 mg/dL   Creatinine, Ser 0.73 0.44 - 1.00 mg/dL   Calcium 9.3 8.9 - 10.3 mg/dL   Total Protein 7.2 6.5 - 8.1 g/dL   Albumin 2.8 (L) 3.5 - 5.0 g/dL   AST 188 (H) 15 - 41 U/L  ALT 94 (H) 14 - 54 U/L   Alkaline Phosphatase 566 (H) 38 - 126 U/L   Total Bilirubin 1.5 (H) 0.3 - 1.2 mg/dL   GFR calc non Af Amer >60 >60 mL/min   GFR calc Af Amer >60 >60 mL/min   Anion gap 8 5 - 15  Lipase, blood     Status: Abnormal   Collection Time: 06/02/15 11:51 PM  Result Value Ref Range   Lipase 11 (L) 22 - 51 U/L  CBC with Differential/Platelet     Status: Abnormal   Collection Time: 06/02/15 11:51 PM  Result Value Ref Range   WBC 15.4 (H) 4.0 - 10.5 K/uL   RBC 3.41 (L) 3.87 - 5.11 MIL/uL   Hemoglobin 11.0 (L) 12.0 - 15.0 g/dL   HCT 33.8 (L) 36.0 - 46.0 %   MCV 99.1 78.0 - 100.0 fL   MCH 32.3 26.0 - 34.0 pg   MCHC 32.5 30.0 - 36.0 g/dL   RDW 14.4 11.5 - 15.5 %   Platelets 412 (H) 150 - 400 K/uL   Neutrophils Relative % 79 (H) 43 - 77 %   Lymphocytes Relative 12 12 - 46 %   Monocytes Relative 9 3 - 12 %   Eosinophils Relative 0 0 - 5 %   Basophils Relative 0 0 - 1 %   Neutro Abs 12.2 (H) 1.7 - 7.7 K/uL   Lymphs Abs 1.8 0.7 - 4.0 K/uL   Monocytes Absolute 1.4 (H) 0.1 - 1.0 K/uL   Eosinophils Absolute 0.0 0.0 - 0.7 K/uL   Basophils Absolute 0.0 0.0 - 0.1 K/uL   WBC Morphology MILD LEFT SHIFT (1-5% METAS, OCC MYELO, OCC BANDS)   Urinalysis, Routine w reflex microscopic (not at [facility])     Status: Abnormal   Collection Time: 06/03/15 12:27 AM  Result Value Ref Range   Color, Urine YELLOW YELLOW   APPearance CLOUDY (A) CLEAR   Specific Gravity, Urine 1.010 1.005 - 1.030   pH 7.0 5.0 - 8.0   Glucose, UA NEGATIVE NEGATIVE mg/dL   Hgb urine dipstick NEGATIVE NEGATIVE   Bilirubin Urine NEGATIVE NEGATIVE   Ketones, ur NEGATIVE NEGATIVE mg/dL   Protein, ur NEGATIVE NEGATIVE mg/dL   Urobilinogen, UA 1.0 0.0 - 1.0 mg/dL   Nitrite NEGATIVE NEGATIVE   Leukocytes, UA TRACE (A) NEGATIVE  Urine microscopic-add on     Status: None   Collection Time: 06/03/15 12:27 AM  Result Value Ref Range   WBC, UA 0-2 <3 WBC/hpf   Bacteria, UA RARE RARE    Imaging Studies: Ct Abdomen  Pelvis W Contrast  06/03/2015   CLINICAL DATA:  Epigastric abdominal pain. History of bloody drainage from cholecystostomy tube.  EXAM: CT ABDOMEN AND PELVIS WITH CONTRAST  TECHNIQUE: Multidetector CT imaging of the abdomen and pelvis was performed using the standard protocol following bolus administration of intravenous contrast.  CONTRAST:  153mL OMNIPAQUE IOHEXOL 300 MG/ML  SOLN  COMPARISON:  Most recent CT 05/27/2015  FINDINGS: Minimal atelectasis at the left lung base.  Percutaneous cholecystostomy tube in place. Do intraluminal hyperdensities, 1 in the gallbladder neck, 1 adjacent to the cholecystostomy tube, may reflect gallstones. There is mild interval distention the gallbladder compared to prior CT. Unchanged degree of extrahepatic biliary prominence, the common bile duct measures 1.4 cm, the contents of the common bile duct appear increased in density compared to prior. Small fluid collection just anterior to the cholecystostomy tube in the liver is slightly larger than on prior exam,  currently 3.3 x 2.5 x 4.4 cm, previously 2.3 x 3.0 x 4.5 cm. There is minimal surrounding soft tissue stranding. There is probable thrombosis of portal venous branches in the left lobe of the liver that appears similar to prior exam.  Fatty atrophy of the pancreas, unchanged from prior. Left adrenal hypertrophy unchanged, right adrenal gland is normal. Spleen is normal. Kidneys demonstrate symmetric enhancement and excretion on delayed phase imaging.  Stomach physiologically distended. There are no dilated or thickened bowel loops. Moderate stool throughout the colon. Diverticulosis of the distal colon without diverticulitis. Sigmoid colon is tortuous. There is no free air or ascites.  Or unchanged aneurysm of the infrarenal abdominal aorta measuring up to 3.7 cm. There is a centric mural thrombus and diffuse atheromatous calcification. No retroperitoneal adenopathy.  Within the pelvis the bladder is physiologically  distended. Uterus is surgically absent. Multiple pelvic phleboliths. No pelvic free fluid. No adnexal mass.  Scoliosis and degenerative change throughout the spine. There are no acute or suspicious osseous abnormalities.  IMPRESSION: 1. Cholecystostomy tube remains in place. Unchanged biliary prominence, however the density in the common bile duct is increased compared to prior, which may reflect complex fluid, hemorrhage or infection. Alternatively, vicarious excretion of contrast related to recent prior CT is considered, but fell is likely. 2. Small fluid collection adjacent to the right lobe of the liver has minimally increased in size from prior exam, there is a small amount of surrounding inflammation. Hematoma or biloma again considered. Given the surrounding inflammation, superimposed infection is not excluded. 3. 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 4. Infrarenal abdominal aortic aneurysm. Follow-up ultrasound in 2 years recommended.   Electronically Signed   By: Jeb Levering M.D.   On: 06/03/2015 02:42   3:24 AM Patient just began vomiting again. Phenergan ordered. Zosyn ordered for possible infection. We'll have admitted to the teaching service at [facility].    Shanon Rosser, MD 06/03/15 [id number]

## 2015-06-03 ENCOUNTER — Encounter (HOSPITAL_COMMUNITY): Payer: Self-pay

## 2015-06-03 ENCOUNTER — Encounter: Payer: Medicare HMO | Admitting: Nurse Practitioner

## 2015-06-03 ENCOUNTER — Emergency Department (HOSPITAL_COMMUNITY): Payer: Medicare HMO

## 2015-06-03 ENCOUNTER — Inpatient Hospital Stay (HOSPITAL_COMMUNITY): Payer: Medicare HMO

## 2015-06-03 DIAGNOSIS — T889XXA Complication of surgical and medical care, unspecified, initial encounter: Secondary | ICD-10-CM | POA: Diagnosis not present

## 2015-06-03 DIAGNOSIS — I480 Paroxysmal atrial fibrillation: Secondary | ICD-10-CM | POA: Diagnosis present

## 2015-06-03 DIAGNOSIS — I4891 Unspecified atrial fibrillation: Secondary | ICD-10-CM

## 2015-06-03 DIAGNOSIS — Z978 Presence of other specified devices: Secondary | ICD-10-CM

## 2015-06-03 DIAGNOSIS — Z86718 Personal history of other venous thrombosis and embolism: Secondary | ICD-10-CM | POA: Diagnosis not present

## 2015-06-03 DIAGNOSIS — B9689 Other specified bacterial agents as the cause of diseases classified elsewhere: Secondary | ICD-10-CM | POA: Diagnosis not present

## 2015-06-03 DIAGNOSIS — E669 Obesity, unspecified: Secondary | ICD-10-CM | POA: Diagnosis present

## 2015-06-03 DIAGNOSIS — Z9889 Other specified postprocedural states: Secondary | ICD-10-CM | POA: Diagnosis not present

## 2015-06-03 DIAGNOSIS — I471 Supraventricular tachycardia: Secondary | ICD-10-CM | POA: Diagnosis not present

## 2015-06-03 DIAGNOSIS — I5030 Unspecified diastolic (congestive) heart failure: Secondary | ICD-10-CM

## 2015-06-03 DIAGNOSIS — F418 Other specified anxiety disorders: Secondary | ICD-10-CM

## 2015-06-03 DIAGNOSIS — I5032 Chronic diastolic (congestive) heart failure: Secondary | ICD-10-CM | POA: Diagnosis present

## 2015-06-03 DIAGNOSIS — Z6841 Body Mass Index (BMI) 40.0 and over, adult: Secondary | ICD-10-CM | POA: Diagnosis not present

## 2015-06-03 DIAGNOSIS — J449 Chronic obstructive pulmonary disease, unspecified: Secondary | ICD-10-CM

## 2015-06-03 DIAGNOSIS — Z7951 Long term (current) use of inhaled steroids: Secondary | ICD-10-CM | POA: Diagnosis not present

## 2015-06-03 DIAGNOSIS — G43909 Migraine, unspecified, not intractable, without status migrainosus: Secondary | ICD-10-CM | POA: Diagnosis present

## 2015-06-03 DIAGNOSIS — I10 Essential (primary) hypertension: Secondary | ICD-10-CM

## 2015-06-03 DIAGNOSIS — G4733 Obstructive sleep apnea (adult) (pediatric): Secondary | ICD-10-CM

## 2015-06-03 DIAGNOSIS — F329 Major depressive disorder, single episode, unspecified: Secondary | ICD-10-CM | POA: Diagnosis present

## 2015-06-03 DIAGNOSIS — B952 Enterococcus as the cause of diseases classified elsewhere: Secondary | ICD-10-CM | POA: Diagnosis present

## 2015-06-03 DIAGNOSIS — Z79899 Other long term (current) drug therapy: Secondary | ICD-10-CM | POA: Diagnosis not present

## 2015-06-03 DIAGNOSIS — R1011 Right upper quadrant pain: Secondary | ICD-10-CM

## 2015-06-03 DIAGNOSIS — R42 Dizziness and giddiness: Secondary | ICD-10-CM | POA: Diagnosis not present

## 2015-06-03 DIAGNOSIS — M797 Fibromyalgia: Secondary | ICD-10-CM

## 2015-06-03 DIAGNOSIS — B965 Pseudomonas (aeruginosa) (mallei) (pseudomallei) as the cause of diseases classified elsewhere: Secondary | ICD-10-CM | POA: Diagnosis present

## 2015-06-03 DIAGNOSIS — R7309 Other abnormal glucose: Secondary | ICD-10-CM | POA: Diagnosis present

## 2015-06-03 DIAGNOSIS — T45515A Adverse effect of anticoagulants, initial encounter: Secondary | ICD-10-CM | POA: Diagnosis present

## 2015-06-03 DIAGNOSIS — Z7982 Long term (current) use of aspirin: Secondary | ICD-10-CM | POA: Diagnosis not present

## 2015-06-03 DIAGNOSIS — Z7901 Long term (current) use of anticoagulants: Secondary | ICD-10-CM | POA: Diagnosis not present

## 2015-06-03 DIAGNOSIS — T8579XA Infection and inflammatory reaction due to other internal prosthetic devices, implants and grafts, initial encounter: Secondary | ICD-10-CM | POA: Diagnosis present

## 2015-06-03 DIAGNOSIS — Z87891 Personal history of nicotine dependence: Secondary | ICD-10-CM | POA: Diagnosis not present

## 2015-06-03 DIAGNOSIS — H8109 Meniere's disease, unspecified ear: Secondary | ICD-10-CM | POA: Diagnosis present

## 2015-06-03 DIAGNOSIS — Z9989 Dependence on other enabling machines and devices: Secondary | ICD-10-CM

## 2015-06-03 DIAGNOSIS — T889XXS Complication of surgical and medical care, unspecified, sequela: Secondary | ICD-10-CM | POA: Diagnosis not present

## 2015-06-03 DIAGNOSIS — I820 Budd-Chiari syndrome: Secondary | ICD-10-CM | POA: Diagnosis not present

## 2015-06-03 DIAGNOSIS — Z9981 Dependence on supplemental oxygen: Secondary | ICD-10-CM

## 2015-06-03 DIAGNOSIS — F411 Generalized anxiety disorder: Secondary | ICD-10-CM | POA: Diagnosis present

## 2015-06-03 DIAGNOSIS — R7303 Prediabetes: Secondary | ICD-10-CM | POA: Diagnosis present

## 2015-06-03 DIAGNOSIS — K219 Gastro-esophageal reflux disease without esophagitis: Secondary | ICD-10-CM | POA: Diagnosis present

## 2015-06-03 DIAGNOSIS — K8011 Calculus of gallbladder with chronic cholecystitis with obstruction: Secondary | ICD-10-CM | POA: Diagnosis present

## 2015-06-03 DIAGNOSIS — K838 Other specified diseases of biliary tract: Secondary | ICD-10-CM | POA: Diagnosis not present

## 2015-06-03 DIAGNOSIS — E785 Hyperlipidemia, unspecified: Secondary | ICD-10-CM | POA: Diagnosis present

## 2015-06-03 DIAGNOSIS — E876 Hypokalemia: Secondary | ICD-10-CM | POA: Diagnosis not present

## 2015-06-03 DIAGNOSIS — K91840 Postprocedural hemorrhage and hematoma of a digestive system organ or structure following a digestive system procedure: Secondary | ICD-10-CM | POA: Diagnosis present

## 2015-06-03 DIAGNOSIS — E871 Hypo-osmolality and hyponatremia: Secondary | ICD-10-CM | POA: Diagnosis present

## 2015-06-03 DIAGNOSIS — R109 Unspecified abdominal pain: Secondary | ICD-10-CM | POA: Diagnosis present

## 2015-06-03 DIAGNOSIS — T889XXD Complication of surgical and medical care, unspecified, subsequent encounter: Secondary | ICD-10-CM | POA: Diagnosis not present

## 2015-06-03 DIAGNOSIS — J439 Emphysema, unspecified: Secondary | ICD-10-CM | POA: Diagnosis present

## 2015-06-03 DIAGNOSIS — K810 Acute cholecystitis: Secondary | ICD-10-CM | POA: Diagnosis not present

## 2015-06-03 DIAGNOSIS — T819XXA Unspecified complication of procedure, initial encounter: Secondary | ICD-10-CM | POA: Diagnosis present

## 2015-06-03 DIAGNOSIS — Z9049 Acquired absence of other specified parts of digestive tract: Secondary | ICD-10-CM | POA: Diagnosis not present

## 2015-06-03 LAB — URINALYSIS, ROUTINE W REFLEX MICROSCOPIC
Bilirubin Urine: NEGATIVE
Glucose, UA: NEGATIVE mg/dL
Hgb urine dipstick: NEGATIVE
Ketones, ur: NEGATIVE mg/dL
Nitrite: NEGATIVE
Protein, ur: NEGATIVE mg/dL
Specific Gravity, Urine: 1.01 (ref 1.005–1.030)
UROBILINOGEN UA: 1 mg/dL (ref 0.0–1.0)
pH: 7 (ref 5.0–8.0)

## 2015-06-03 LAB — CBC WITH DIFFERENTIAL/PLATELET
Basophils Absolute: 0 10*3/uL (ref 0.0–0.1)
Basophils Relative: 0 % (ref 0–1)
EOS ABS: 0 10*3/uL (ref 0.0–0.7)
EOS PCT: 0 % (ref 0–5)
HCT: 33.8 % — ABNORMAL LOW (ref 36.0–46.0)
Hemoglobin: 11 g/dL — ABNORMAL LOW (ref 12.0–15.0)
LYMPHS ABS: 1.8 10*3/uL (ref 0.7–4.0)
Lymphocytes Relative: 12 % (ref 12–46)
MCH: 32.3 pg (ref 26.0–34.0)
MCHC: 32.5 g/dL (ref 30.0–36.0)
MCV: 99.1 fL (ref 78.0–100.0)
MONO ABS: 1.4 10*3/uL — AB (ref 0.1–1.0)
Monocytes Relative: 9 % (ref 3–12)
NEUTROS PCT: 79 % — AB (ref 43–77)
Neutro Abs: 12.2 10*3/uL — ABNORMAL HIGH (ref 1.7–7.7)
Platelets: 412 10*3/uL — ABNORMAL HIGH (ref 150–400)
RBC: 3.41 MIL/uL — AB (ref 3.87–5.11)
RDW: 14.4 % (ref 11.5–15.5)
WBC: 15.4 10*3/uL — ABNORMAL HIGH (ref 4.0–10.5)

## 2015-06-03 LAB — CBC
HEMATOCRIT: 33 % — AB (ref 36.0–46.0)
Hemoglobin: 11.1 g/dL — ABNORMAL LOW (ref 12.0–15.0)
MCH: 32.5 pg (ref 26.0–34.0)
MCHC: 33.6 g/dL (ref 30.0–36.0)
MCV: 96.5 fL (ref 78.0–100.0)
PLATELETS: 449 10*3/uL — AB (ref 150–400)
RBC: 3.42 MIL/uL — ABNORMAL LOW (ref 3.87–5.11)
RDW: 14.2 % (ref 11.5–15.5)
WBC: 16 10*3/uL — AB (ref 4.0–10.5)

## 2015-06-03 LAB — COMPREHENSIVE METABOLIC PANEL
ALK PHOS: 566 U/L — AB (ref 38–126)
ALT: 253 U/L — ABNORMAL HIGH (ref 14–54)
ALT: 94 U/L — ABNORMAL HIGH (ref 14–54)
AST: 188 U/L — ABNORMAL HIGH (ref 15–41)
AST: 522 U/L — AB (ref 15–41)
Albumin: 2.5 g/dL — ABNORMAL LOW (ref 3.5–5.0)
Albumin: 2.8 g/dL — ABNORMAL LOW (ref 3.5–5.0)
Alkaline Phosphatase: 738 U/L — ABNORMAL HIGH (ref 38–126)
Anion gap: 10 (ref 5–15)
Anion gap: 8 (ref 5–15)
BILIRUBIN TOTAL: 1.5 mg/dL — AB (ref 0.3–1.2)
BILIRUBIN TOTAL: 2.8 mg/dL — AB (ref 0.3–1.2)
BUN: 12 mg/dL (ref 6–20)
BUN: 7 mg/dL (ref 6–20)
CALCIUM: 8.9 mg/dL (ref 8.9–10.3)
CHLORIDE: 102 mmol/L (ref 101–111)
CO2: 22 mmol/L (ref 22–32)
CO2: 25 mmol/L (ref 22–32)
CREATININE: 0.73 mg/dL (ref 0.44–1.00)
Calcium: 9.3 mg/dL (ref 8.9–10.3)
Chloride: 100 mmol/L — ABNORMAL LOW (ref 101–111)
Creatinine, Ser: 0.6 mg/dL (ref 0.44–1.00)
GFR calc Af Amer: >60 mL/min (ref >60)
GLUCOSE: 174 mg/dL — AB (ref 65–99)
Glucose, Bld: 188 mg/dL — ABNORMAL HIGH (ref 65–99)
POTASSIUM: 3.3 mmol/L — AB (ref 3.5–5.1)
Potassium: 3.9 mmol/L (ref 3.5–5.1)
Sodium: 133 mmol/L — ABNORMAL LOW (ref 135–145)
Sodium: 134 mmol/L — ABNORMAL LOW (ref 135–145)
Total Protein: 7.2 g/dL (ref 6.5–8.1)
Total Protein: 7.5 g/dL (ref 6.5–8.1)

## 2015-06-03 LAB — GRAM STAIN

## 2015-06-03 LAB — URINE MICROSCOPIC-ADD ON

## 2015-06-03 LAB — LIPASE, BLOOD: Lipase: 11 U/L — ABNORMAL LOW (ref 22–51)

## 2015-06-03 MED ORDER — AMITRIPTYLINE HCL 50 MG PO TABS
50.0000 mg | ORAL_TABLET | Freq: Every day | ORAL | Status: DC
  Administered 2015-06-03 – 2015-06-09 (×7): 50 mg via ORAL
  Filled 2015-06-03 (×7): qty 1

## 2015-06-03 MED ORDER — AMIODARONE HCL 200 MG PO TABS
200.0000 mg | ORAL_TABLET | Freq: Two times a day (BID) | ORAL | Status: DC
  Administered 2015-06-03 – 2015-06-10 (×14): 200 mg via ORAL
  Filled 2015-06-03 (×14): qty 1

## 2015-06-03 MED ORDER — MOMETASONE FURO-FORMOTEROL FUM 100-5 MCG/ACT IN AERO
2.0000 | INHALATION_SPRAY | Freq: Two times a day (BID) | RESPIRATORY_TRACT | Status: DC
  Administered 2015-06-03 – 2015-06-10 (×13): 2 via RESPIRATORY_TRACT
  Filled 2015-06-03: qty 8.8

## 2015-06-03 MED ORDER — TIOTROPIUM BROMIDE MONOHYDRATE 18 MCG IN CAPS
18.0000 ug | ORAL_CAPSULE | Freq: Every day | RESPIRATORY_TRACT | Status: DC
  Administered 2015-06-03 – 2015-06-08 (×5): 18 ug via RESPIRATORY_TRACT
  Filled 2015-06-03 (×2): qty 5

## 2015-06-03 MED ORDER — PANTOPRAZOLE SODIUM 40 MG PO TBEC
40.0000 mg | DELAYED_RELEASE_TABLET | Freq: Every day | ORAL | Status: DC
Start: 2015-06-03 — End: 2015-06-10
  Administered 2015-06-03 – 2015-06-10 (×7): 40 mg via ORAL
  Filled 2015-06-03 (×7): qty 1

## 2015-06-03 MED ORDER — ALPRAZOLAM 0.5 MG PO TABS
0.5000 mg | ORAL_TABLET | Freq: Three times a day (TID) | ORAL | Status: DC | PRN
  Administered 2015-06-03 – 2015-06-10 (×11): 0.5 mg via ORAL
  Filled 2015-06-03 (×13): qty 1

## 2015-06-03 MED ORDER — SODIUM CHLORIDE 0.9 % IJ SOLN
3.0000 mL | Freq: Two times a day (BID) | INTRAMUSCULAR | Status: DC
  Administered 2015-06-03 – 2015-06-08 (×4): 3 mL via INTRAVENOUS

## 2015-06-03 MED ORDER — IOHEXOL 300 MG/ML  SOLN
100.0000 mL | Freq: Once | INTRAMUSCULAR | Status: AC | PRN
  Administered 2015-06-03: 100 mL via INTRAVENOUS

## 2015-06-03 MED ORDER — NIFEDIPINE ER OSMOTIC RELEASE 90 MG PO TB24
90.0000 mg | ORAL_TABLET | Freq: Every day | ORAL | Status: DC
  Administered 2015-06-03 – 2015-06-10 (×7): 90 mg via ORAL
  Filled 2015-06-03 (×9): qty 1

## 2015-06-03 MED ORDER — PIPERACILLIN-TAZOBACTAM 3.375 G IVPB
3.3750 g | Freq: Three times a day (TID) | INTRAVENOUS | Status: DC
  Administered 2015-06-03 – 2015-06-10 (×20): 3.375 g via INTRAVENOUS
  Filled 2015-06-03 (×23): qty 50

## 2015-06-03 MED ORDER — BISACODYL 10 MG RE SUPP
10.0000 mg | Freq: Every day | RECTAL | Status: DC
  Administered 2015-06-03 – 2015-06-08 (×4): 10 mg via RECTAL
  Filled 2015-06-03 (×8): qty 1

## 2015-06-03 MED ORDER — HYDROCHLOROTHIAZIDE 25 MG PO TABS
12.5000 mg | ORAL_TABLET | Freq: Every day | ORAL | Status: DC
  Administered 2015-06-03 – 2015-06-05 (×3): 12.5 mg via ORAL
  Filled 2015-06-03 (×3): qty 1

## 2015-06-03 MED ORDER — FLEET ENEMA 7-19 GM/118ML RE ENEM
1.0000 | ENEMA | Freq: Every day | RECTAL | Status: DC | PRN
  Filled 2015-06-03: qty 1

## 2015-06-03 MED ORDER — MORPHINE SULFATE 2 MG/ML IJ SOLN
1.0000 mg | INTRAMUSCULAR | Status: DC | PRN
  Administered 2015-06-03 – 2015-06-04 (×3): 1 mg via INTRAVENOUS
  Filled 2015-06-03 (×3): qty 1

## 2015-06-03 MED ORDER — SODIUM CHLORIDE 0.9 % IV SOLN
INTRAVENOUS | Status: DC
  Administered 2015-06-03: 04:00:00 via INTRAVENOUS

## 2015-06-03 MED ORDER — SENNOSIDES-DOCUSATE SODIUM 8.6-50 MG PO TABS
1.0000 | ORAL_TABLET | Freq: Two times a day (BID) | ORAL | Status: DC
  Administered 2015-06-03 – 2015-06-10 (×14): 1 via ORAL
  Filled 2015-06-03 (×14): qty 1

## 2015-06-03 MED ORDER — SODIUM CHLORIDE 0.9 % IV SOLN: INTRAVENOUS | Status: AC

## 2015-06-03 MED ORDER — ALBUTEROL SULFATE (2.5 MG/3ML) 0.083% IN NEBU
3.0000 mL | INHALATION_SOLUTION | Freq: Four times a day (QID) | RESPIRATORY_TRACT | Status: DC | PRN
Start: 2015-06-03 — End: 2015-06-10

## 2015-06-03 MED ORDER — PIPERACILLIN-TAZOBACTAM 3.375 G IVPB 30 MIN
3.3750 g | Freq: Once | INTRAVENOUS | Status: AC
  Administered 2015-06-03: 3.375 g via INTRAVENOUS
  Filled 2015-06-03: qty 50

## 2015-06-03 MED ORDER — PROMETHAZINE HCL 25 MG/ML IJ SOLN
12.5000 mg | Freq: Once | INTRAMUSCULAR | Status: AC
  Administered 2015-06-03: 12.5 mg via INTRAVENOUS
  Filled 2015-06-03 (×2): qty 1

## 2015-06-03 MED ORDER — PROMETHAZINE HCL 25 MG/ML IJ SOLN
12.5000 mg | Freq: Four times a day (QID) | INTRAMUSCULAR | Status: DC | PRN
  Administered 2015-06-03 – 2015-06-04 (×5): 12.5 mg via INTRAVENOUS
  Filled 2015-06-03 (×5): qty 1

## 2015-06-03 MED ORDER — SERTRALINE HCL 100 MG PO TABS
100.0000 mg | ORAL_TABLET | Freq: Two times a day (BID) | ORAL | Status: DC
  Administered 2015-06-03 – 2015-06-10 (×14): 100 mg via ORAL
  Filled 2015-06-03 (×14): qty 1

## 2015-06-03 MED ORDER — TOPIRAMATE 25 MG PO TABS
50.0000 mg | ORAL_TABLET | Freq: Two times a day (BID) | ORAL | Status: DC
  Administered 2015-06-03 – 2015-06-10 (×14): 50 mg via ORAL
  Filled 2015-06-03 (×17): qty 2

## 2015-06-03 NOTE — Care Management (Signed)
UR completed . Magdalen Spatz RN BSN [phone]

## 2015-06-03 NOTE — H&P (Signed)
Date: 06/03/2015               Patient Name:  Jennifer [name] MRN: [id number]  DOB: 11/11/1941 Age / Sex: 74 y.o., female   PCP: Bertha Stakes, MD         Medical Service: Internal Medicine Teaching Service          Attending Physician: Dr. Madilyn Fireman, MD    First Contact: Dr. Genene Churn Pager: [phone]  Second Contact: Dr. Naaman Plummer Pager: [phone]       After Hours (After 5p/  First Contact Pager: [phone]  weekends / holidays): Second Contact Pager: [phone]   Chief Complaint: abd pain and bloody drainage from cholecystostomy tube  History of Present Illness: Pt is a 74 y/o F w/ PMHx of HTN, CHF, COPD, fibromyalgia, afib w/ RVR on xarelto, cholecystostomy tube placement on 5/20  for acute cholecystitis and recent admission for acute portal vein thrombosis  who presents with abd pain and bloody drainage from cholecystostomy tube. Pt was recently discharged on 6/24 after she was admitted for the same reason and found to have a portal vein thrombosis. She was also found to be in afib w/ RVR and started on amiodarone and xarelto. On 6/27 pt developed abdominal pain and started noticing bloody drainage in her bag. Pt states that abd pain is 8/10 in severity and feels like a dull pain. She compares it to how she felt when she had her acute cholecystitis.She has not had a bowel movement since discharge on Friday and feels like she has gas. Denies fevers. She felt nauseous this morning and had one episode of vomiting while at [facility] ED. She then went to the [facility] ED at which time CT of abd/pelvis revealed a small fluid collection adjacent to the rt lobe of the liver w/ small amt of surround inflammation that could be a hematoma vs biloma vs superimposed infection. Surgery was contacted and recommended starting zosyn, and they will come evaluate her today.         Meds: Current Facility-Administered Medications  Medication Dose Route Frequency Provider Last Rate Last Dose  . 0.9 %   sodium chloride infusion   Intravenous STAT Shanon Rosser, MD 125 mL/hr at 06/03/15 0334    . fentaNYL (SUBLIMAZE) injection 50 mcg  50 mcg Intravenous Q1H PRN Shanon Rosser, MD   50 mcg at 06/03/15 0421    Allergies: Allergies as of 06/02/2015 - Review Complete 06/02/2015  Allergen Reaction Noted  . Tetracycline Other (See Comments) 12/29/2006  . Flonase [fluticasone] Cough 08/12/2012  . Lisinopril Cough 01/06/2011   Past Medical History  Diagnosis Date  . COPD (chronic obstructive pulmonary disease)     O2 dependent. Followed by Dr. Joya Gaskins  . Fibromyalgia   . Hypertension   . OSA (obstructive sleep apnea)     Sleep study done April 02, 2008 showed severe obstructive sleep apnea.  . Diastolic dysfunction     Grade I diastolic dysfunction as shown on ECHO Dec 2011.  EF of 65-70%.  Marland Kitchen COPD (chronic obstructive pulmonary disease)     Followed by Dr. Joya Gaskins  . Dyslipidemia   . Osteopenia     DXA scan done on 10/23/2009 showed osteopenia of AP spine (young adult T-score -1.3), osteopenia of left femur neck (young adult T-score -1.8), and normal bone density of right femur neck (young adult T-score -0.8), with a FRAX estimate of the 10-year probability of a major osteoporotic fracture of 9.7% and probability of hip fracture  1.6%.  . 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 Estanislado Pandy on 11/10/2004.  MRA of the head on 03/09/2012 showed no evidence for recanalization.  . Osteoarthrosis involving multiple sites   . Chronic back pain   . Depression   . Anxiety   . Anemia   . GERD (gastroesophageal reflux disease)   . Meniere disease     Followed by ENT Dr. Silvestre Moment  . Diverticulosis of colon   . Multiple pigmented nevi   . Hypokalemia   . S/P hysterectomy 1975  . Pneumonia 11/2004  . Knee pain   . Dysphagia   . Urosepsis 11/04/2010    Klebsiella pneumoniae  . Breast pain   . Allergic rhinitis   . Fibromyalgia     . 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     . Urinary incontinence    Past Surgical History  Procedure Laterality Date  . Abdominal hysterectomy  1975  . 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 Estanislado Pandy on 11/10/2004.   Family History  Problem Relation Age of Onset  . Ulcers Mother   . Ovarian cancer Sister 71  . Cervical cancer Daughter   . Cervical cancer Daughter   . Lung cancer Neg Hx   . Colon cancer Neg Hx   . Heart attack Father   . Cerebral aneurysm Sister 20   History   Social History  . Marital Status: Widowed    Spouse Name: N/A  . Number of Children: N/A  . Years of Education: N/A   Occupational History  . Not on file.   Social History Main Topics  . Smoking status: Former Smoker -- 0.50 packs/day for 30 years    Types: Cigarettes    Quit date: 12/07/2008  . Smokeless tobacco: Former Systems developer    Types: [employer] date: 12/06/1978  . Alcohol Use: No  . Drug Use: No  . Sexual Activity: No   Other Topics Concern  . Not on file   Social History Narrative   Financial assistance approved for 100% discount after Medicare pays for [facility] only, not eligible for [facility] card. Per Bonna Gains    Review of Systems: Pertinent items are noted in HPI.  Physical Exam: Blood pressure 167/96, pulse 93, temperature 98.5 F (36.9 C), temperature source Oral, resp. rate 25, height 5\' 3"  (1.6 m), weight 222 lb (100.699 kg), SpO2 94 %. Physical Exam  Constitutional: She appears well-developed and well-nourished.  Sleepy   HENT:  Head: Normocephalic.  Mouth/Throat: Oropharynx is clear and moist.  Cardiovascular: Normal rate, regular rhythm and intact distal  pulses.   No murmur heard. Pulmonary/Chest: Effort normal. No respiratory distress.  Abdominal: Soft. Bowel sounds are normal. There is tenderness (rt middle quadrant, biliary drain with blood in bag, dressing dry).  Musculoskeletal: She exhibits edema (trace pitting edema on left leg).  Skin: Skin is warm and dry. She is not diaphoretic.     Lab results: Basic Metabolic Panel:  Recent Labs  06/02/15 2351  NA 133*  K 3.9  CL 100*  CO2 25  GLUCOSE 188*  BUN 12  CREATININE 0.73  CALCIUM 9.3  Liver Function Tests:  Recent Labs  06/02/15 2351  AST 188*  ALT 94*  ALKPHOS 566*  BILITOT 1.5*  PROT 7.2  ALBUMIN 2.8*    Recent Labs  06/02/15 2351  LIPASE 11*   CBC:  Recent Labs  06/02/15 2351  WBC 15.4*  NEUTROABS 12.2*  HGB 11.0*  HCT 33.8*  MCV 99.1  PLT 412*   Urinalysis:  Recent Labs  06/03/15 0027  COLORURINE YELLOW  LABSPEC 1.010  PHURINE 7.0  GLUCOSEU NEGATIVE  HGBUR NEGATIVE  BILIRUBINUR NEGATIVE  KETONESUR NEGATIVE  PROTEINUR NEGATIVE  UROBILINOGEN 1.0  NITRITE NEGATIVE  LEUKOCYTESUR TRACE*    Imaging results:  Ct Abdomen Pelvis W Contrast  06/03/2015   CLINICAL DATA:  Epigastric abdominal pain. History of bloody drainage from cholecystostomy tube.  EXAM: CT ABDOMEN AND PELVIS WITH CONTRAST  TECHNIQUE: Multidetector CT imaging of the abdomen and pelvis was performed using the standard protocol following bolus administration of intravenous contrast.  CONTRAST:  176mL OMNIPAQUE IOHEXOL 300 MG/ML  SOLN  COMPARISON:  Most recent CT 05/27/2015  FINDINGS: Minimal atelectasis at the left lung base.  Percutaneous cholecystostomy tube in place. Do intraluminal hyperdensities, 1 in the gallbladder neck, 1 adjacent to the cholecystostomy tube, may reflect gallstones. There is mild interval distention the gallbladder compared to prior CT. Unchanged degree of extrahepatic biliary prominence, the common bile duct measures 1.4 cm, the contents of the common  bile duct appear increased in density compared to prior. Small fluid collection just anterior to the cholecystostomy tube in the liver is slightly larger than on prior exam, currently 3.3 x 2.5 x 4.4 cm, previously 2.3 x 3.0 x 4.5 cm. There is minimal surrounding soft tissue stranding. There is probable thrombosis of portal venous branches in the left lobe of the liver that appears similar to prior exam.  Fatty atrophy of the pancreas, unchanged from prior. Left adrenal hypertrophy unchanged, right adrenal gland is normal. Spleen is normal. Kidneys demonstrate symmetric enhancement and excretion on delayed phase imaging.  Stomach physiologically distended. There are no dilated or thickened bowel loops. Moderate stool throughout the colon. Diverticulosis of the distal colon without diverticulitis. Sigmoid colon is tortuous. There is no free air or ascites.  Or unchanged aneurysm of the infrarenal abdominal aorta measuring up to 3.7 cm. There is a centric mural thrombus and diffuse atheromatous calcification. No retroperitoneal adenopathy.  Within the pelvis the bladder is physiologically distended. Uterus is surgically absent. Multiple pelvic phleboliths. No pelvic free fluid. No adnexal mass.  Scoliosis and degenerative change throughout the spine. There are no acute or suspicious osseous abnormalities.  IMPRESSION: 1. Cholecystostomy tube remains in place. Unchanged biliary prominence, however the density in the common bile duct is increased compared to prior, which may reflect complex fluid, hemorrhage or infection. Alternatively, vicarious excretion of contrast related to recent prior CT is considered, but fell is likely. 2. Small fluid collection adjacent to the right lobe of the liver has minimally increased in size from prior exam, there is a small amount of surrounding inflammation. Hematoma or biloma again considered. Given the surrounding inflammation, superimposed infection is not excluded. 3. Unchanged  appearance of thrombus of the branches of the portal vein in the left hepatic lobe. 4. Infrarenal abdominal aortic aneurysm. Follow-up ultrasound in 2 years recommended.   Electronically Signed   By: Jeb Levering M.D.   On: 06/03/2015 02:42    Assessment & Plan by Problem: Active Problems:   Complication of previous biliary non-surgical procedure  Presumed infection from recent cholecystomy tube placement on 5/20 - Pt presenting with abdominal pain and acute bleeding from cholecystostomy site and into drainage bag x 1 day. CT abd/pelvis reveals small area of inflammation that could be a superimposed infection vs hematoma vs biloma. Pt is afebrile, has an elevated white cell count, and elevated transaminases in a cholestatic pattern.  During last admission IR recommended imaging of gallbladder drain tract if bleeding recurs. Her chole tube was suppose stay in place x 6 weeks, she has an appt today with Dr. Hulen Skains w/ sx for f/u. She was started on zosyn in the ED.  - admit to tele - cont IV zosyn - surgery to evaluate pt today - CMET - CBC - morphine 1mg  q4h prn for pain   Afib with RVR - CHADS2 vasc 3 (htn, age, chf) --likely 2/2 to sepsis or Acute Portal vein thrombosis  - cont amiodarone 200mg  BId - hold xarelto due to bleeding  HTN - at home on HCTZ 12.5mg  daily and nifedipine 90mg  daily.  - resume home meds   CHF - last TTE 04/2015 w/ EF 55-60% with grade 1 diastolic dysfunction  COPD--on advair 1 puff bid, spiriva 18 mch inh daily, and albuterol 1-2 puff q6hprn -cont dulera1 puff bid, spiriva 18 mch inh daily, and albuterol 1-2 puff q6hprn  Fibromyalgia- at home she takes norco 7.5-325mg  QID prn, amitriptyline 50mg  qhs, carisoprodol 350mg  qd prn - pain management as above  OSA- cont CPAP qhs  Anxiety and depression-- cont home zoloft 100mg  BID, elavil 50mg  qhs, and xanax 0.5mg  BID oprn  Migraines- cont home topamax 50mg  BID  FEN - diet- NPO  - NS at 75cc/hr  DVT  ppx-SCDs, hold xarelto in case of surgery  CODE-- FULL  Dispo: Disposition is deferred at this time, awaiting improvement of current medical problems.    The patient does have a current PCP Bertha Stakes, MD) and does need an [facility] hospital follow-up appointment after discharge.  The patient does not have transportation limitations that hinder transportation to clinic appointments.  Signed: Norman Herrlich, MD 06/03/2015, 5:42 AM

## 2015-06-03 NOTE — Progress Notes (Signed)
PT has home CPAP mask with her and needed machine. Set up by RT according to home setting of 17cmH2O with humidity. PT will put herself on when ready for nap or call for help.

## 2015-06-03 NOTE — Progress Notes (Signed)
ANTIBIOTIC CONSULT NOTE - INITIAL  Pharmacy Consult for Zosyn  Indication: Intra-abdominal infection  Allergies  Allergen Reactions  . Tetracycline Other (See Comments)    REACTION: stomach upset  . Flonase [Fluticasone] Cough  . Lisinopril Cough    Patient Measurements: Height: 5\' 3"  (160 cm) Weight: 235 lb 10.8 oz (106.9 kg) IBW/kg (Calculated) : 52.4  Vital Signs: Temp: 98.5 F (36.9 C) (06/28 0448) Temp Source: Oral (06/28 0438) BP: 167/96 mmHg (06/28 0430) Pulse Rate: 91 (06/28 0410)  Labs:  Recent Labs  06/02/15 2351  WBC 15.4*  HGB 11.0*  PLT 412*  CREATININE 0.73   Estimated Creatinine Clearance: 70.1 mL/min (by C-G formula based on Cr of 0.73).   Medical History: Past Medical History  Diagnosis Date  . COPD (chronic obstructive pulmonary disease)     O2 dependent. Followed by Dr. Joya Gaskins  . Fibromyalgia   . Hypertension   . OSA (obstructive sleep apnea)     Sleep study done April 02, 2008 showed severe obstructive sleep apnea.  . Diastolic dysfunction     Grade I diastolic dysfunction as shown on ECHO Dec 2011.  EF of 65-70%.  Marland Kitchen COPD (chronic obstructive pulmonary disease)     Followed by Dr. Joya Gaskins  . Dyslipidemia   . Osteopenia     DXA scan done on 10/23/2009 showed osteopenia of AP spine (young adult T-score -1.3), osteopenia of left femur neck (young adult T-score -1.8), and normal bone density of right femur neck (young adult T-score -0.8), with a FRAX estimate of the 10-year probability of a major osteoporotic fracture of 9.7% and probability of hip fracture 1.6%.  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 Estanislado Pandy on 11/10/2004.  MRA of the head on 03/09/2012 showed no evidence for recanalization.  . Osteoarthrosis involving multiple sites   . Chronic back pain   . Depression   . Anxiety   . Anemia   . GERD (gastroesophageal reflux disease)   . Meniere disease    Followed by ENT Dr. Silvestre Moment  . Diverticulosis of colon   . Multiple pigmented nevi   . Hypokalemia   . S/P hysterectomy 1975  . Pneumonia 11/2004  . Knee pain   . Dysphagia   . Urosepsis 11/04/2010    Klebsiella pneumoniae  . Breast pain   . Allergic rhinitis   . Fibromyalgia   . 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     . Urinary incontinence    Assessment: Zosyn per pharmacy for intra-abdominal infection, pt with recent cholecystomy tube placement in May, WBC mildly elevated, renal function ok, other labs as above.  Plan:  Zosyn 3.375G IV q8h to be infused over 4 hours Trend WBC, temp, renal function   Narda Bonds 06/03/2015,6:58 AM

## 2015-06-03 NOTE — Progress Notes (Signed)
Subjective:  Feeling generalize malaise, has nausea and vomitting, no fever or chills. Has ab pain. Had bleeding around the cholecystostomy tube insertion site and also draining blood. No active bleeding at the insertion site right now. Daughter is very frustrated about her bleeding. Wants answers.   Also stated discharge summary was not received by SNF. I told them that's impossible as they wouldn't know to give her xarelto without the discharge summary and it's policy to have d/c summary in hand before taking patient to any rehab.    Objective: Vital signs in last 24 hours: Filed Vitals:   06/03/15 0433 06/03/15 0438 06/03/15 0448 06/03/15 0823  BP:      Pulse:      Temp: 98.3 F (36.8 C) 98.3 F (36.8 C) 98.5 F (36.9 C)   TempSrc: Oral Oral    Resp:      Height:      Weight:      SpO2:    95%   Weight change:  No intake or output data in the 24 hours ending 06/03/15 0914 Vitals reviewed. General: resting in bed, appears to be in pain and frustrated HEENT: PERRL, EOMI, no scleral icterus Cardiac: RRR, no rubs, murmurs or gallops Pulm: clear to auscultation bilaterally, no wheezes, rales, or rhonchi Abd: cholecystostomy site has some dried blood but no active bleeding, covered by gauge. Cholecystostomy bag has ~100 cc blood in it right now. Has generalized tenderness on the abdomen, more than the RML.  Ext: warm and well perfused, no pedal edema Neuro: alert and oriented X3, cranial nerves II-XII grossly intact  Lab Results: Basic Metabolic Panel:  Recent Labs Lab 05/28/15 1627 05/29/15 0530 05/30/15 0530 06/02/15 2351  NA 134* 134* 137 133*  K 3.0* 3.2* 4.0 3.9  CL 104 104 106 100*  CO2 23 24 26 25   GLUCOSE 134* 129* 105* 188*  BUN 8 5* <5* 12  CREATININE 0.55 0.57 0.58 0.73  CALCIUM 8.3* 8.6* 8.9 9.3  MG 1.7  --  2.0  --   PHOS  --  2.5  --   --    Liver Function Tests:  Recent Labs Lab 05/30/15 0530 06/02/15 2351  AST 20 188*  ALT 135* 94*    ALKPHOS 183* 566*  BILITOT 1.0 1.5*  PROT 5.6* 7.2  ALBUMIN 2.0* 2.8*    Recent Labs Lab 05/27/15 1600 06/02/15 2351  LIPASE 11* 11*   No results for input(s): AMMONIA in the last 168 hours. CBC:  Recent Labs Lab 05/29/15 0530  05/30/15 0530 06/02/15 2351  WBC 13.1*  < > 12.5* 15.4*  NEUTROABS 10.2*  --   --  12.2*  HGB 9.5*  < > 9.7* 11.0*  HCT 28.7*  < > 29.7* 33.8*  MCV 97.6  < > 99.0 99.1  PLT 210  < > 230 412*  < > = values in this interval not displayed. Hemoglobin A1C:  Recent Labs Lab 05/29/15 0530  HGBA1C 6.0*   Fasting Lipid Panel: No results for input(s): CHOL, HDL, LDLCALC, TRIG, CHOLHDL, LDLDIRECT in the last 168 hours. Thyroid Function Tests:  Recent Labs Lab 05/29/15 1225  TSH 2.895   Coagulation:  Recent Labs Lab 05/29/15 1225  LABPROT 18.0*  INR 1.48   Anemia Panel: No results for input(s): VITAMINB12, FOLATE, FERRITIN, TIBC, IRON, RETICCTPCT in the last 168 hours. Urine Drug Screen: Drugs of Abuse  No results found for: LABOPIA, COCAINSCRNUR, LABBENZ, AMPHETMU, THCU, LABBARB  Alcohol Level: No results for  input(s): ETH in the last 168 hours. Urinalysis:  Recent Labs Lab 05/27/15 1643 06/03/15 0027  COLORURINE AMBER* YELLOW  LABSPEC 1.016 1.010  PHURINE 5.5 7.0  GLUCOSEU NEGATIVE NEGATIVE  HGBUR NEGATIVE NEGATIVE  BILIRUBINUR MODERATE* NEGATIVE  KETONESUR NEGATIVE NEGATIVE  PROTEINUR NEGATIVE NEGATIVE  UROBILINOGEN 0.2 1.0  NITRITE NEGATIVE NEGATIVE  LEUKOCYTESUR SMALL* TRACE*   Misc. Labs:  Micro Results: Recent Results (from the past 240 hour(s))  MRSA PCR Screening     Status: Abnormal   Collection Time: 05/27/15 12:59 AM  Result Value Ref Range Status   MRSA by PCR POSITIVE (A) NEGATIVE Final    Comment:        The GeneXpert MRSA Assay (FDA approved for NASAL specimens only), is one component of a comprehensive MRSA colonization surveillance program. It is not intended to diagnose MRSA infection nor to  guide or monitor treatment for MRSA infections. RESULT CALLED TO, READ BACK BY AND VERIFIED WITH: W.WORK RN [phone] 05/28/15 E.GADDY   Blood Culture (routine x 2)     Status: None   Collection Time: 05/27/15  5:55 PM  Result Value Ref Range Status   Specimen Description BLOOD RIGHT HAND  Final   Special Requests BOTTLES DRAWN AEROBIC AND ANAEROBIC 5CC  Final   Culture NO GROWTH 5 DAYS  Final   Report Status 06/01/2015 FINAL  Final  Blood Culture (routine x 2)     Status: None   Collection Time: 05/27/15  5:58 PM  Result Value Ref Range Status   Specimen Description BLOOD LEFT HAND  Final   Special Requests BOTTLES DRAWN AEROBIC ONLY 2CC  Final   Culture NO GROWTH 5 DAYS  Final   Report Status 06/01/2015 FINAL  Final   Studies/Results: Ct Abdomen Pelvis W Contrast  06/03/2015   CLINICAL DATA:  Epigastric abdominal pain. History of bloody drainage from cholecystostomy tube.  EXAM: CT ABDOMEN AND PELVIS WITH CONTRAST  TECHNIQUE: Multidetector CT imaging of the abdomen and pelvis was performed using the standard protocol following bolus administration of intravenous contrast.  CONTRAST:  137mL OMNIPAQUE IOHEXOL 300 MG/ML  SOLN  COMPARISON:  Most recent CT 05/27/2015  FINDINGS: Minimal atelectasis at the left lung base.  Percutaneous cholecystostomy tube in place. Do intraluminal hyperdensities, 1 in the gallbladder neck, 1 adjacent to the cholecystostomy tube, may reflect gallstones. There is mild interval distention the gallbladder compared to prior CT. Unchanged degree of extrahepatic biliary prominence, the common bile duct measures 1.4 cm, the contents of the common bile duct appear increased in density compared to prior. Small fluid collection just anterior to the cholecystostomy tube in the liver is slightly larger than on prior exam, currently 3.3 x 2.5 x 4.4 cm, previously 2.3 x 3.0 x 4.5 cm. There is minimal surrounding soft tissue stranding. There is probable thrombosis of portal venous  branches in the left lobe of the liver that appears similar to prior exam.  Fatty atrophy of the pancreas, unchanged from prior. Left adrenal hypertrophy unchanged, right adrenal gland is normal. Spleen is normal. Kidneys demonstrate symmetric enhancement and excretion on delayed phase imaging.  Stomach physiologically distended. There are no dilated or thickened bowel loops. Moderate stool throughout the colon. Diverticulosis of the distal colon without diverticulitis. Sigmoid colon is tortuous. There is no free air or ascites.  Or unchanged aneurysm of the infrarenal abdominal aorta measuring up to 3.7 cm. There is a centric mural thrombus and diffuse atheromatous calcification. No retroperitoneal adenopathy.  Within the pelvis the bladder  is physiologically distended. Uterus is surgically absent. Multiple pelvic phleboliths. No pelvic free fluid. No adnexal mass.  Scoliosis and degenerative change throughout the spine. There are no acute or suspicious osseous abnormalities.  IMPRESSION: 1. Cholecystostomy tube remains in place. Unchanged biliary prominence, however the density in the common bile duct is increased compared to prior, which may reflect complex fluid, hemorrhage or infection. Alternatively, vicarious excretion of contrast related to recent prior CT is considered, but fell is likely. 2. Small fluid collection adjacent to the right lobe of the liver has minimally increased in size from prior exam, there is a small amount of surrounding inflammation. Hematoma or biloma again considered. Given the surrounding inflammation, superimposed infection is not excluded. 3. 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 4. Infrarenal abdominal aortic aneurysm. Follow-up ultrasound in 2 years recommended.   Electronically Signed   By: Jeb Levering M.D.   On: 06/03/2015 02:42   Medications: I have reviewed the patient's current medications. Scheduled Meds: .  amiodarone  200 mg Oral BID  . amitriptyline  50 mg Oral QHS  . hydrochlorothiazide  12.5 mg Oral Daily  . mometasone-formoterol  2 puff Inhalation BID  . NIFEdipine  90 mg Oral Daily  . pantoprazole  40 mg Oral Daily  . piperacillin-tazobactam (ZOSYN)  IV  3.375 g Intravenous 3 times per day  . senna-docusate  1 tablet Oral BID  . sertraline  100 mg Oral BID  . sodium chloride  3 mL Intravenous Q12H  . tiotropium  18 mcg Inhalation Daily  . topiramate  50 mg Oral BID   Continuous Infusions: . sodium chloride     PRN Meds:.albuterol, ALPRAZolam, morphine injection, promethazine Assessment/Plan: Principal Problem:   Complication of previous biliary non-surgical procedure Active Problems:   ANXIETY DISORDER, GENERALIZED   Depression   Obstructive sleep apnea   Meniere's disease   Essential hypertension   Diastolic heart failure   COPD with emphysema   Gastroesophageal reflux disease   Paroxysmal atrial fibrillation   Hepatic vein thrombosis   Cholecystostomy drain infection  74 yo female with cholecystostomy bag placed for cholecystitis, recent portal vein thrombosis, afib, on anticoag here with cholecystostomy site bleeding.  Cholecystostomy tube bleeding with possible infection-had tube palced on 5/20, came last admission with bleeding as well, was found to have portal vein thrombosis, was placed on xarelto and discharged to rehab for portal vein thrombosis and also Afib indications. Had bleeding starting yesterday, also having nausea, vomitting, ab pain. LFTs are slightly up.  CT abdomen showed increased density in CBD which could be complex fluid vs hemorrhage or infection. Surgery team was notified about this patient and recommended Zosyn to cover for any infection and to stop xarelto. - her bleeding may have stopped or reduced after stopping xarelto. Continue to hold it for now - continue zosyn - called surgery again, awaiting their recs. - continue pain management with  morhpine 1mg  q4hrf or pain. - trend CBC- overall stable.   Recent portal vein thrombosis - hold xarelto for now as she had bleeding at cholecystostomy site. - hypercoag panel outpatient, doppler u/s in 2-3 weeks to further evaluate for hepatic/portal flow  Afib with RVR - chads2vas 3 - likely was initiated by sepsis/acute portal vein thrombosis last admisison - cont amio for now. - hold xarelto as above  HTN - at home on HCTZ 12.5mg  daily and nifedipine 90mg  daily.  - resume home meds  CHF - last TTE 04/2015 w/ EF 55-60% with grade 1 diastolic dysfunction  COPD--on advair 1 puff bid, spiriva 18 mch inh daily, and albuterol 1-2 puff q6hprn -cont dulera1 puff bid, spiriva 18 mch inh daily, and albuterol 1-2 puff q6hprn  Fibromyalgia- at home she takes norco 7.5-325mg  QID prn, amitriptyline 50mg  qhs, carisoprodol 350mg  qd prn - pain management as above  OSA- cont CPAP qhs  Anxiety and depression-- cont home zoloft 100mg  BID, elavil 50mg  qhs, and xanax 0.5mg  BID oprn  Migraines- cont home topamax 50mg  BID  Pre-diabetes -hgba1c 6.0 - lifestyle mod.  Diet: NPO until surgery evaluates pt. Code: full.  Dispo: Disposition is deferred at this time, awaiting improvement of current medical problems.  Anticipated discharge in approximately 1-2 day(s).   The patient does have a current PCP Bertha Stakes, MD) and does need an [facility] hospital follow-up appointment after discharge.  The patient does have transportation limitations that hinder transportation to clinic appointments.  .Services Needed at time of discharge: Y = Yes, Blank = No PT:   OT:   RN:   Equipment:   Other:     LOS: 0 days   Dellia Nims, MD 06/03/2015, 9:14 AM

## 2015-06-03 NOTE — Progress Notes (Signed)
Pt placed on telemetry and CCMD notified. Jennifer [name]

## 2015-06-03 NOTE — Progress Notes (Signed)
Nutrition Brief Note  Patient identified on the Malnutrition Screening Tool (MST) Report  Wt Readings from Last 15 Encounters:  06/03/15 235 lb 10.8 oz (106.9 kg)  06/02/15 224 lb (101.606 kg)  05/27/15 228 lb 2.8 oz (103.5 kg)  05/06/15 221 lb (100.245 kg)  05/02/15 220 lb 14.4 oz (100.2 kg)  01/22/15 239 lb 1.6 oz (108.455 kg)  01/08/15 239 lb (108.41 kg)  12/12/14 239 lb (108.41 kg)  10/14/14 239 lb (108.41 kg)  07/03/14 236 lb 14.4 oz (107.457 kg)  06/05/14 238 lb 8 oz (108.183 kg)  04/03/14 244 lb 12.8 oz (111.041 kg)  01/08/14 249 lb 6.4 oz (113.127 kg)  10/31/13 247 lb (112.038 kg)  09/11/13 251 lb 12.8 oz (114.216 kg)    Body mass index is 41.76 kg/(m^2). Patient meets criteria for extreme obesity, class III based on current BMI.   Current diet order is NPO (awaiting surgical evaluation for cholecystectomy site bleeding) , patient is consuming approximately n/a% of meals at this time. Labs and medications reviewed.   No nutrition interventions warranted at this time. If nutrition issues arise, please consult RD.   Willodean Leven A. Jimmye Norman, RD, LDN, CDE Pager: [phone] After hours Pager: [phone]

## 2015-06-03 NOTE — ED Notes (Signed)
Patient transported to CT 

## 2015-06-03 NOTE — Procedures (Signed)
-   Appropriately positioned cholecystostomy tube with end coiled and locked within the gallbladder fundus. No exchange performed.  - Percutaneous cholangiogram demonstrates complete opacification of the gallbladder lumen as well as the opacified portions of the cystic, common hepatic and common bile ducts with ill-defined filling defects favored to represent blood products.   PLAN: - Given the very minimal amount of residual largely nonocclusive thrombus within the central aspect of the left intrahepatic biliary system, would advise against continued aggressive anti coagulation.  If patient is liver function tests continued to rise, would consider ERCP for potential temporizing biliary stent placement.  - If patient continues to experience bloody drainage surrounding the cholecystostomy tube, this could be up sized to a 12 [place] percutaneous drain, however would advise this procedure be performed with sedation secondary to patient's inability to tolerate today's percutaneous cholangiogram.

## 2015-06-03 NOTE — Consult Note (Signed)
[facility] Surgery Consult Note  Jennifer [name] November 13, 1941  [id number].    Requesting MD: Dr. Ellwood Dense, S Chief Complaint/Reason for Consult: Bleeding at perc chole and bleeding out perc chole  HPI:  74 y/o F w/ PMHx multiple medical problems lending to her being a poor surgical candidate including COPD, OSA, diastolic dysfunction, afib w/ RVR recently started on Xarelto.  She recently had perc cholecystostomy tube placement on 04/25/15 for acute cholecystitis.  Pt was recently discharged on 05/30/15 after she was admitted for bleeding from perc chole tube and found to be in afib w/ RVR and questionable portal vein/hepatic thrombosis and started on amiodarone and xarelto.  While admitted a drain study was done on 05/25/15 which showed patent cystic duct, but contrast did not empty to duodenum concerning for obstruction of the CBD.  Apparently the tube was capped off, but gravity bag had to be re-initiated.  On 6/27 pt developed abdominal pain and started noticing lots bloody drainage in her bag.  Pt states that abd pain was 8/10 in severity and feels like a dull pain.  She compares it to how she felt when she had her acute cholecystitis.  She presented back to [facility] on 06/03/15 with abd pain, N/V, and bloody drainage from cholecystostomy tube.  She has not had a bowel movement since discharge on Friday and feels like she has gas.  Denies fevers/chills, CP/SOB.  Has been nauseated, one episode of vomiting [facility], but now dry heaving and spitting up phlegm.  At [facility] a CT of abd/pelvis revealed a small fluid collection adjacent to the Right lobe of the liver w/ small amt of surround inflammation that could be a hematoma vs biloma vs superimposed infection.  Patient started on Zosyn.  Transferred to [facility] on Internal medicine teaching service.  They have requested [place] to see the patient regarding the problems with the perc chole tube (Bleeding), her being symptomatic with N/V and abdominal pain with elevated alk  phos, bili and ALT/AST.     ROS: All systems reviewed and otherwise negative except for as above  Family History  Problem Relation Age of Onset  . Ulcers Mother   . Ovarian cancer Sister 31  . Cervical cancer Daughter   . Cervical cancer Daughter   . Lung cancer Neg Hx   . Colon cancer Neg Hx   . Heart attack Father   . Cerebral aneurysm Sister 65    Past Medical History  Diagnosis Date  . COPD (chronic obstructive pulmonary disease)     O2 dependent. Followed by Dr. Joya Gaskins  . Fibromyalgia   . Hypertension   . OSA (obstructive sleep apnea)     Sleep study done April 02, 2008 showed severe obstructive sleep apnea.  . Diastolic dysfunction     Grade I diastolic dysfunction as shown on ECHO Dec 2011.  EF of 65-70%.  Marland Kitchen COPD (chronic obstructive pulmonary disease)     Followed by Dr. Joya Gaskins  . Dyslipidemia   . Osteopenia     DXA scan done on 10/23/2009 showed osteopenia of AP spine (young adult T-score -1.3), osteopenia of left femur neck (young adult T-score -1.8), and normal bone density of right femur neck (young adult T-score -0.8), with a FRAX estimate of the 10-year probability of a major osteoporotic fracture of 9.7% and probability of hip fracture 1.6%.  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 Estanislado Pandy on 11/10/2004.  MRA of  the head on 03/09/2012 showed no evidence for recanalization.  . Osteoarthrosis involving multiple sites   . Chronic back pain   . Depression   . Anxiety   . Anemia   . GERD (gastroesophageal reflux disease)   . Meniere disease     Followed by ENT Dr. Silvestre Moment  . Diverticulosis of colon   . Multiple pigmented nevi   . Hypokalemia   . S/P hysterectomy 1975  . Pneumonia 11/2004  . Knee pain   . Dysphagia   . Urosepsis 11/04/2010    Klebsiella pneumoniae  . Breast pain   . Allergic rhinitis   . Fibromyalgia   . Cerebral ventriculomegaly 03/09/2012    MRI of the  brain on 03/09/2012 showed moderate ventricular enlargement out of proportion to the degree of cortical atrophy, most evident when compared with 2008, with a comment that normal  pressure hydrocephalus is not excluded.     . Urinary incontinence     Past Surgical History  Procedure Laterality Date  . Abdominal hysterectomy  1975  . 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 Estanislado Pandy on 11/10/2004.  Marland Kitchen Ct perc cholecystostomy  04/26/15    Dr. Anselm Pancoast    Social History:  reports that she quit smoking about 6 years ago. Her smoking use included Cigarettes. She has a 15 pack-year smoking history. She quit smokeless tobacco use about 36 years ago. Her smokeless tobacco use included Chew. She reports that she does not drink alcohol or use illicit drugs.  Allergies:  Allergies  Allergen Reactions  . Tetracycline Other (See Comments)    REACTION: stomach upset  . Flonase [Fluticasone] Cough  . Lisinopril Cough    Medications Prior to Admission  Medication Sig Dispense Refill  . albuterol (PROAIR HFA) 108 (90 BASE) MCG/ACT inhaler INHALE 2 PUFFS BY MOUTH INTO LUNGS FOUR TIMES DAILY AS NEEDED (Patient taking differently: Inhale 2 puffs into the lungs every 6 (six) hours as needed for wheezing or shortness of breath. ) 3 Inhaler 6  . ALPRAZolam (XANAX) 0.5 MG tablet Take 1 tablet (0.5 mg total) by mouth 3 (three) times daily as needed for anxiety. 20 tablet 0  . amiodarone (PACERONE) 200 MG tablet Take 1 tablet (200 mg total) by mouth 2 (two) times daily. 60 tablet 0  . amitriptyline (ELAVIL) 50 MG tablet Take 1 tablet (50 mg total) by mouth at bedtime. 30 tablet 3  . aspirin 81 MG EC tablet Take 81 mg by mouth daily.      . carisoprodol (SOMA) 350 MG tablet Take 1 tablet (350 mg total) by mouth daily as needed for muscle spasms. 30 tablet 3  . Cholecalciferol (VITAMIN D3) 1000 UNITS CAPS Take 1 capsule (1,000 Units  total) by mouth daily. 30 capsule 2  . CINNAMON PO Take 1 tablet by mouth 2 (two) times daily.     Marland Kitchen dextromethorphan-guaiFENesin (MUCINEX DM) 30-600 MG per 12 hr tablet Take 1 tablet by mouth 2 (two) times daily.    Marland Kitchen docusate sodium (COLACE) 100 MG capsule Take 100 mg by mouth daily as needed for mild constipation or moderate constipation.    . Fluticasone-Salmeterol (ADVAIR DISKUS) 250-50 MCG/DOSE AEPB Inhale 1 puff into the lungs 2 (two) times daily. 180 each 4  . hydrochlorothiazide (HYDRODIURIL) 25 MG tablet Take 0.5 tablets (12.5 mg total) by mouth daily. 45 tablet 3  . HYDROcodone-acetaminophen (NORCO/VICODIN) 5-325 MG per tablet Take 1-2  tablets by mouth every 4 (four) hours as needed for moderate pain. (Patient taking differently: Take 2 tablets by mouth every 4 (four) hours as needed for moderate pain. ) 15 tablet 0  . hydroxypropyl methylcellulose (ISOPTO TEARS) 2.5 % ophthalmic solution Place 2 drops into both eyes 3 (three) times daily as needed. Dry eye    . ibuprofen (ADVIL,MOTRIN) 200 MG tablet Take 200 mg by mouth every 6 (six) hours as needed (for moderate pain).    . meclizine (ANTIVERT) 12.5 MG tablet Take 1 tablet (12.5 mg total) by mouth 2 (two) times daily as needed for dizziness. 180 tablet 0  . Multiple Vitamin (MULTIVITAMIN) tablet Take 1 tablet by mouth daily.    Marland Kitchen NIFEdipine (ADALAT CC) 90 MG 24 hr tablet Take 1 tablet (90 mg total) by mouth daily. 90 tablet 2  . omeprazole (PRILOSEC) 20 MG capsule Take 1 capsule (20 mg total) by mouth 2 (two) times daily. 180 capsule 3  . potassium chloride SA (K-DUR,KLOR-CON) 20 MEQ tablet Take 2 tablets (40 mEq total) by mouth 2 (two) times daily. (Patient taking differently: Take 20 mEq by mouth 2 (two) times daily. ) 60 tablet 0  . Red Yeast Rice Extract (RED YEAST RICE PO) Take 1 tablet by mouth 2 (two) times daily.     . Rivaroxaban (XARELTO) 15 MG TABS tablet Take 1 tablet (15 mg total) by mouth 2 (two) times daily with a meal. 42  tablet 0  . sertraline (ZOLOFT) 100 MG tablet Take 1 tablet (100 mg total) by mouth 2 (two) times daily. 180 tablet 1  . tiotropium (SPIRIVA HANDIHALER) 18 MCG inhalation capsule Place 1 capsule (18 mcg total) into inhaler and inhale daily. 90 capsule 4  . topiramate (TOPAMAX) 50 MG tablet Take 1 tablet (50 mg total) by mouth 2 (two) times daily. 60 tablet 2  . [START ON 06/19/2015] rivaroxaban (XARELTO) 20 MG TABS tablet Take 1 tablet (20 mg total) by mouth daily with supper. 30 tablet 0    Blood pressure 161/91, pulse 91, temperature 98.5 F (36.9 C), temperature source Oral, resp. rate 25, height $RemoveBe'5\' 3"'cFXanBrMf$  (1.6 m), weight 106.9 kg (235 lb 10.8 oz), SpO2 95 %. Physical Exam: General: pleasant, WD/WN white female who is laying in bed in pain HEENT: head is normocephalic, atraumatic.  Sclera are noninjected.  PERRL.  Ears and nose without any masses or lesions.  Mouth is pink and moist  Heart: regular, rate, and rhythm.  No obvious murmurs, gallops, or rubs noted.  Palpable pedal pulses bilaterally Lungs: CTAB, no wheezes, rhonchi, or rales noted.  Respiratory effort nonlabored, good effort Abd: soft, tender in RUQ/epigastrium, +BS, no masses, perc chole tube in left abdomen, suture has been pulled out, bright red blood oozing from the skin, soaking the dressing, perc chole tube with bile and dark red blood MS: all 4 extremities are symmetrical with no cyanosis, clubbing, or edema. Skin: warm and dry with no masses, lesions, or rashes Psych: A&Ox3 with an appropriate affect.   Results for orders placed or performed during the hospital encounter of 06/02/15 (from the past 48 hour(s))  Comprehensive metabolic panel     Status: Abnormal   Collection Time: 06/02/15 11:51 PM  Result Value Ref Range   Sodium 133 (L) 135 - 145 mmol/L   Potassium 3.9 3.5 - 5.1 mmol/L   Chloride 100 (L) 101 - 111 mmol/L   CO2 25 22 - 32 mmol/L   Glucose, Bld 188 (H) 65 -  99 mg/dL   BUN 12 6 - 20 mg/dL   Creatinine,  Ser 0.73 0.44 - 1.00 mg/dL   Calcium 9.3 8.9 - 10.3 mg/dL   Total Protein 7.2 6.5 - 8.1 g/dL   Albumin 2.8 (L) 3.5 - 5.0 g/dL   AST 188 (H) 15 - 41 U/L   ALT 94 (H) 14 - 54 U/L   Alkaline Phosphatase 566 (H) 38 - 126 U/L   Total Bilirubin 1.5 (H) 0.3 - 1.2 mg/dL   GFR calc non Af Amer >60 >60 mL/min   GFR calc Af Amer >60 >60 mL/min    Comment: (NOTE) The eGFR has been calculated using the CKD EPI equation. This calculation has not been validated in all clinical situations. eGFR's persistently <60 mL/min signify possible Chronic Kidney Disease.    Anion gap 8 5 - 15  Lipase, blood     Status: Abnormal   Collection Time: 06/02/15 11:51 PM  Result Value Ref Range   Lipase 11 (L) 22 - 51 U/L  CBC with Differential/Platelet     Status: Abnormal   Collection Time: 06/02/15 11:51 PM  Result Value Ref Range   WBC 15.4 (H) 4.0 - 10.5 K/uL   RBC 3.41 (L) 3.87 - 5.11 MIL/uL   Hemoglobin 11.0 (L) 12.0 - 15.0 g/dL   HCT 33.8 (L) 36.0 - 46.0 %   MCV 99.1 78.0 - 100.0 fL   MCH 32.3 26.0 - 34.0 pg   MCHC 32.5 30.0 - 36.0 g/dL   RDW 14.4 11.5 - 15.5 %   Platelets 412 (H) 150 - 400 K/uL   Neutrophils Relative % 79 (H) 43 - 77 %   Lymphocytes Relative 12 12 - 46 %   Monocytes Relative 9 3 - 12 %   Eosinophils Relative 0 0 - 5 %   Basophils Relative 0 0 - 1 %   Neutro Abs 12.2 (H) 1.7 - 7.7 K/uL   Lymphs Abs 1.8 0.7 - 4.0 K/uL   Monocytes Absolute 1.4 (H) 0.1 - 1.0 K/uL   Eosinophils Absolute 0.0 0.0 - 0.7 K/uL   Basophils Absolute 0.0 0.0 - 0.1 K/uL   WBC Morphology MILD LEFT SHIFT (1-5% METAS, OCC MYELO, OCC BANDS)   Urinalysis, Routine w reflex microscopic (not at [facility])     Status: Abnormal   Collection Time: 06/03/15 12:27 AM  Result Value Ref Range   Color, Urine YELLOW YELLOW   APPearance CLOUDY (A) CLEAR   Specific Gravity, Urine 1.010 1.005 - 1.030   pH 7.0 5.0 - 8.0   Glucose, UA NEGATIVE NEGATIVE mg/dL   Hgb urine dipstick NEGATIVE NEGATIVE   Bilirubin Urine NEGATIVE  NEGATIVE   Ketones, ur NEGATIVE NEGATIVE mg/dL   Protein, ur NEGATIVE NEGATIVE mg/dL   Urobilinogen, UA 1.0 0.0 - 1.0 mg/dL   Nitrite NEGATIVE NEGATIVE   Leukocytes, UA TRACE (A) NEGATIVE  Urine microscopic-add on     Status: None   Collection Time: 06/03/15 12:27 AM  Result Value Ref Range   WBC, UA 0-2 <3 WBC/hpf   Bacteria, UA RARE RARE  Gram stain     Status: None   Collection Time: 06/03/15  3:36 AM  Result Value Ref Range   Specimen Description BILE    Special Requests NONE    Gram Stain      FEW WBC PRESENT, PREDOMINANTLY PMN MODERATE GRAM NEGATIVE RODS Gram Stain Report Called to,Read Back By and Verified With: T SPENCER,RN AT 1300 06/03/15 BY K BARR  Report Status 06/03/2015 FINAL   Comprehensive metabolic panel     Status: Abnormal   Collection Time: 06/03/15 10:11 AM  Result Value Ref Range   Sodium 134 (L) 135 - 145 mmol/L   Potassium 3.3 (L) 3.5 - 5.1 mmol/L   Chloride 102 101 - 111 mmol/L   CO2 22 22 - 32 mmol/L   Glucose, Bld 174 (H) 65 - 99 mg/dL   BUN 7 6 - 20 mg/dL   Creatinine, Ser 0.60 0.44 - 1.00 mg/dL   Calcium 8.9 8.9 - 10.3 mg/dL   Total Protein 7.5 6.5 - 8.1 g/dL   Albumin 2.5 (L) 3.5 - 5.0 g/dL   AST 522 (H) 15 - 41 U/L   ALT 253 (H) 14 - 54 U/L   Alkaline Phosphatase 738 (H) 38 - 126 U/L   Total Bilirubin 2.8 (H) 0.3 - 1.2 mg/dL   GFR calc non Af Amer >60 >60 mL/min   GFR calc Af Amer >60 >60 mL/min    Comment: (NOTE) The eGFR has been calculated using the CKD EPI equation. This calculation has not been validated in all clinical situations. eGFR's persistently <60 mL/min signify possible Chronic Kidney Disease.    Anion gap 10 5 - 15  CBC     Status: Abnormal   Collection Time: 06/03/15 10:11 AM  Result Value Ref Range   WBC 16.0 (H) 4.0 - 10.5 K/uL   RBC 3.42 (L) 3.87 - 5.11 MIL/uL   Hemoglobin 11.1 (L) 12.0 - 15.0 g/dL   HCT 33.0 (L) 36.0 - 46.0 %   MCV 96.5 78.0 - 100.0 fL   MCH 32.5 26.0 - 34.0 pg   MCHC 33.6 30.0 - 36.0 g/dL    RDW 14.2 11.5 - 15.5 %   Platelets 449 (H) 150 - 400 K/uL   Ct Abdomen Pelvis W Contrast  06/03/2015   CLINICAL DATA:  Epigastric abdominal pain. History of bloody drainage from cholecystostomy tube.  EXAM: CT ABDOMEN AND PELVIS WITH CONTRAST  TECHNIQUE: Multidetector CT imaging of the abdomen and pelvis was performed using the standard protocol following bolus administration of intravenous contrast.  CONTRAST:  11mL OMNIPAQUE IOHEXOL 300 MG/ML  SOLN  COMPARISON:  Most recent CT 05/27/2015  FINDINGS: Minimal atelectasis at the left lung base.  Percutaneous cholecystostomy tube in place. Do intraluminal hyperdensities, 1 in the gallbladder neck, 1 adjacent to the cholecystostomy tube, may reflect gallstones. There is mild interval distention the gallbladder compared to prior CT. Unchanged degree of extrahepatic biliary prominence, the common bile duct measures 1.4 cm, the contents of the common bile duct appear increased in density compared to prior. Small fluid collection just anterior to the cholecystostomy tube in the liver is slightly larger than on prior exam, currently 3.3 x 2.5 x 4.4 cm, previously 2.3 x 3.0 x 4.5 cm. There is minimal surrounding soft tissue stranding. There is probable thrombosis of portal venous branches in the left lobe of the liver that appears similar to prior exam.  Fatty atrophy of the pancreas, unchanged from prior. Left adrenal hypertrophy unchanged, right adrenal gland is normal. Spleen is normal. Kidneys demonstrate symmetric enhancement and excretion on delayed phase imaging.  Stomach physiologically distended. There are no dilated or thickened bowel loops. Moderate stool throughout the colon. Diverticulosis of the distal colon without diverticulitis. Sigmoid colon is tortuous. There is no free air or ascites.  Or unchanged aneurysm of the infrarenal abdominal aorta measuring up to 3.7 cm. There is a centric mural thrombus  and diffuse atheromatous calcification. No  retroperitoneal adenopathy.  Within the pelvis the bladder is physiologically distended. Uterus is surgically absent. Multiple pelvic phleboliths. No pelvic free fluid. No adnexal mass.  Scoliosis and degenerative change throughout the spine. There are no acute or suspicious osseous abnormalities.  IMPRESSION: 1. Cholecystostomy tube remains in place. Unchanged biliary prominence, however the density in the common bile duct is increased compared to prior, which may reflect complex fluid, hemorrhage or infection. Alternatively, vicarious excretion of contrast related to recent prior CT is considered, but fell is likely. 2. Small fluid collection adjacent to the right lobe of the liver has minimally increased in size from prior exam, there is a small amount of surrounding inflammation. Hematoma or biloma again considered. Given the surrounding inflammation, superimposed infection is not excluded. 3. 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 4. Infrarenal abdominal aortic aneurysm. Follow-up ultrasound in 2 years recommended.   Electronically Signed   By: Jeb Levering M.D.   On: 06/03/2015 02:42      Assessment/Plan H/o Acute cholecystitis - s/p perc chole tube 04/25/15 ?CBD obstruction - most recent cholangiogram (05/27/15) showed patent cystic duct, but did not show emptying into the duodenum Transaminitis 522/253 Elevated Alk Phos 738 Leukocytosis 16.0 Elevated bilirubin 1.5 ?Left hepatic vein/PV thrombosis? AFIB on Xarelto -I discussed the patient with Dr. Georgette Dover, Dr. Hulen Skains, and Dr. Pascal Lux.   -Hold Xarelto -Patient has frank blood and bile out perc chole tube.  Her suture from her perc chole tube was pulled out and she started bleeding around and through the tube.  IR can replace this suture to ensure the tube isn't removed.  There is a possibility that the bleeding is leaking from the skin back into around the liver or that bleeding from  gallbladder is leaking out around the tube creating a fluid collection around the liver.  This may need to be aspirated as well.   -Talked with Dr. Pascal Lux who reviewed the CT with me.  He notes that the PV is widely patent with only a small clot noted higher up in the left hepatic lobe.  This may have been a soft call and may not require anticoagulation specifically for this especially in light of all her bleeding issues.  She does have AFIB.  This may need to be a benefit vs harm discussion with the patient and the family.   -Dr Pascal Lux would recommend repeat drain study (cholangiogram through the perc chole drain) to evaluate for CBD obstruction.  She could have passed some sludge or stones which may be the cause of the LFT elevation.  If this is the case may need GI for ERCP consideration.  The cystic duct was patent on last imaging.   -If skin continues to bleed around tube could obtain "snow" from OR to the surface of the skin to help prevent bleeding of the skin. -Recheck labs in AM -NPO and hold blood thinners for possible IR procedure -If not going today could have sips of clears -Add dulcolax and enema PRN for constipation -Patient says no IV pain meds along help her pain, consider adding robaxin and/or IV tylenol since can't take much PO due to nausea. -No plans for surgical intervention at this time.  Would continue perc chole tube for 6-8 weeks before consideration of removal.  If consideration for surgery at that time is appropriate then Cards clearence would need to be obtained.  Nat Christen, [facility] Surgery 06/03/2015, 1:23 PM Pager: [phone]

## 2015-06-04 DIAGNOSIS — I5032 Chronic diastolic (congestive) heart failure: Secondary | ICD-10-CM

## 2015-06-04 DIAGNOSIS — Z01810 Encounter for preprocedural cardiovascular examination: Secondary | ICD-10-CM

## 2015-06-04 DIAGNOSIS — I820 Budd-Chiari syndrome: Secondary | ICD-10-CM

## 2015-06-04 DIAGNOSIS — T889XXD Complication of surgical and medical care, unspecified, subsequent encounter: Secondary | ICD-10-CM

## 2015-06-04 DIAGNOSIS — Z01818 Encounter for other preprocedural examination: Secondary | ICD-10-CM

## 2015-06-04 DIAGNOSIS — I480 Paroxysmal atrial fibrillation: Secondary | ICD-10-CM

## 2015-06-04 DIAGNOSIS — R7309 Other abnormal glucose: Secondary | ICD-10-CM

## 2015-06-04 LAB — LIPASE, BLOOD: Lipase: 11 U/L — ABNORMAL LOW (ref 22–51)

## 2015-06-04 LAB — COMPREHENSIVE METABOLIC PANEL
ALBUMIN: 2.2 g/dL — AB (ref 3.5–5.0)
ALT: 384 U/L — ABNORMAL HIGH (ref 14–54)
ANION GAP: 11 (ref 5–15)
AST: 416 U/L — ABNORMAL HIGH (ref 15–41)
Alkaline Phosphatase: 733 U/L — ABNORMAL HIGH (ref 38–126)
BILIRUBIN TOTAL: 5 mg/dL — AB (ref 0.3–1.2)
BUN: 8 mg/dL (ref 6–20)
CALCIUM: 8.9 mg/dL (ref 8.9–10.3)
CO2: 24 mmol/L (ref 22–32)
CREATININE: 0.53 mg/dL (ref 0.44–1.00)
Chloride: 96 mmol/L — ABNORMAL LOW (ref 101–111)
GFR calc Af Amer: >60 mL/min (ref >60)
GFR calc non Af Amer: >60 mL/min (ref >60)
Glucose, Bld: 141 mg/dL — ABNORMAL HIGH (ref 65–99)
Potassium: 2.2 mmol/L — CL (ref 3.5–5.1)
Sodium: 131 mmol/L — ABNORMAL LOW (ref 135–145)
TOTAL PROTEIN: 6.6 g/dL (ref 6.5–8.1)

## 2015-06-04 LAB — CBC
HCT: 28.7 % — ABNORMAL LOW (ref 36.0–46.0)
Hemoglobin: 9.9 g/dL — ABNORMAL LOW (ref 12.0–15.0)
MCH: 32.4 pg (ref 26.0–34.0)
MCHC: 34.5 g/dL (ref 30.0–36.0)
MCV: 93.8 fL (ref 78.0–100.0)
PLATELETS: 399 10*3/uL (ref 150–400)
RBC: 3.06 MIL/uL — ABNORMAL LOW (ref 3.87–5.11)
RDW: 13.9 % (ref 11.5–15.5)
WBC: 39.6 10*3/uL — ABNORMAL HIGH (ref 4.0–10.5)

## 2015-06-04 MED ORDER — POTASSIUM CHLORIDE 10 MEQ/100ML IV SOLN
10.0000 meq | INTRAVENOUS | Status: AC
  Administered 2015-06-04 (×5): 10 meq via INTRAVENOUS
  Filled 2015-06-04 (×5): qty 100

## 2015-06-04 MED ORDER — PANTOPRAZOLE SODIUM 20 MG PO TBEC
20.0000 mg | DELAYED_RELEASE_TABLET | Freq: Once | ORAL | Status: AC
  Administered 2015-06-04: 20 mg via ORAL
  Filled 2015-06-04: qty 1

## 2015-06-04 MED ORDER — MORPHINE SULFATE 2 MG/ML IJ SOLN
1.0000 mg | INTRAMUSCULAR | Status: DC | PRN
  Administered 2015-06-04 – 2015-06-08 (×12): 2 mg via INTRAVENOUS
  Filled 2015-06-04 (×12): qty 1

## 2015-06-04 NOTE — Progress Notes (Signed)
CRITICAL VALUE ALERT  Critical value received:  Gram positive cocci from bile culture  Date of notification:  06/04/15  Time of notification:  1020  Critical value read back: Yes  Nurse who received alert:  Morton Peters   MD notified (1st page):  Dr. Genene Churn  Time of first page:  1022  MD notified (2nd page):  Time of second page:  Responding MD:   Time MD responded: Awaiting response from page

## 2015-06-04 NOTE — Consult Note (Signed)
Reason for Consult: Cardiac clearance for surgery  Referring Physician: Dellia Nims, MD  PCP:  Axel Filler, MD  Primary Cardiologist: Dr. Johnsie Cancel, per pt request  Jennifer [name] is an 74 y.o. female.    Chief Complaint: Abdominal pain and bloody drainage from cholecystostomy tube  HPI:  Jennifer [name] has a past medical hx of HTN, CHF, COPD, OSA using CPAP nightly and fibromyalgia. She had been O2 dependent from 2005-2010, but now maintains O2 sats in mid 90's on room air per the daughters. She usually  resides with her 2 daughters, but has been at [facility] for rehab following recent hospitalizations. She was hospitalized on 04/25/2015 for acute cholecystitis and had a cholecystostomy placed as she was not felt to be a good candidate for cholecystectomy. She discharged to SNF and on 6/19 went to the ED with abdominal pain and blood in her drainage bag. She was subsequently admitted 05/27/2015-05/30/2015 for evaluation of her abdominal symptoms. While in the ED she became tachycardic with rate up to the 250's, tele revealed SVT. She was given adenosine X2 with resolution. She then had another episode of SVT that self resolved and Afib with RVR. Back in May PAF with wide complex seen by EP and not VT.   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 The patient was started on amiodarone 05/27/15 IV then to po and Xarelto. Chads2vasc score is at least 4---She converted to NSR. She was seen in consult by Fransico Him, MD.  She presented with rebleeding from the cholecystomy tube. She is being treated for possible GB infection with Zosyn (leukocytosis, abdominal pain/vomiting, increased LFTs).  Xarelto currently on hold in anticipation of possible surgery; last dose 06/02/2015.  Maintaining sinus rhythm on tele.  ECHO 04/26/2015- Impressions:  EF 55-60% - Normal LV function; grade 1 diastolic dysfunction; mild LAE; trace TR; mildly dilated ascending aorta (4.2 cm).   Past Medical History    Diagnosis Date  . COPD (chronic obstructive pulmonary disease)     O2 dependent. Followed by Dr. Joya Gaskins  . Fibromyalgia   . Hypertension   . OSA (obstructive sleep apnea)     Sleep study done April 02, 2008 showed severe obstructive sleep apnea.  . Diastolic dysfunction     Grade I diastolic dysfunction as shown on ECHO Dec 2011.  EF of 65-70%.  Marland Kitchen COPD (chronic obstructive pulmonary disease)     Followed by Dr. Joya Gaskins  . Dyslipidemia   . Osteopenia     DXA scan done on 10/23/2009 showed osteopenia of AP spine (young adult T-score -1.3), osteopenia of left femur neck (young adult T-score -1.8), and normal bone density of right femur neck (young adult T-score -0.8), with a FRAX estimate of the 10-year probability of a major osteoporotic fracture of 9.7% and probability of hip fracture 1.6%.  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 Estanislado Pandy on 11/10/2004.  MRA of the head on 03/09/2012 showed no evidence for recanalization.  . Osteoarthrosis involving multiple sites   . Chronic back pain   . Depression   . Anxiety   . Anemia   . GERD (gastroesophageal reflux disease)   . Meniere disease     Followed by ENT Dr. Silvestre Moment  . Diverticulosis of colon   . Multiple pigmented nevi   . Hypokalemia   . S/P hysterectomy 1975  . Pneumonia 11/2004  . Knee pain   .  Dysphagia   . Urosepsis 11/04/2010    Klebsiella pneumoniae  . Breast pain   . Allergic rhinitis   . Fibromyalgia   . 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     . Urinary incontinence     Past Surgical History  Procedure Laterality Date  . Abdominal hysterectomy  1975  . Aneurysm coiling  11/10/2004    S/P endovascular  obliteration of large right internal carotid intracranial aneurysm by Dr. Estanislado Pandy on 11/10/2004.  Marland Kitchen Ct perc cholecystostomy  04/26/15    Dr. Anselm Pancoast    Family History  Problem Relation Age of Onset  . Ulcers Mother   . Ovarian cancer Sister 39  . Cervical cancer Daughter   . Cervical cancer Daughter   . Lung cancer Neg Hx   . Colon cancer Neg Hx   . Heart attack Father   . Cerebral aneurysm Sister 67   Social History:  reports that she quit smoking about 6 years ago. Her smoking use included Cigarettes. She has a 15 pack-year smoking history. She quit smokeless tobacco use about 36 years ago. Her smokeless tobacco use included Chew. She reports that she does not drink alcohol or use illicit drugs.  Allergies:  Allergies  Allergen Reactions  . Tetracycline Other (See Comments)    REACTION: stomach upset  . Flonase [Fluticasone] Cough  . Lisinopril Cough   Outpatient Meds: No current facility-administered medications on file prior to encounter.   Current Outpatient Prescriptions on File Prior to Encounter  Medication Sig Dispense Refill  . albuterol (PROAIR HFA) 108 (90 BASE) MCG/ACT inhaler INHALE 2 PUFFS BY MOUTH INTO LUNGS FOUR TIMES DAILY AS NEEDED (Patient taking differently: Inhale 2 puffs into the lungs every 6 (six) hours as needed for wheezing or shortness of breath. ) 3 Inhaler 6  . ALPRAZolam (XANAX) 0.5 MG tablet Take 1 tablet (0.5 mg total) by mouth 3 (three) times daily as needed for anxiety. 20 tablet 0  . amiodarone (PACERONE) 200 MG tablet Take 1 tablet (200 mg total) by mouth 2 (two) times daily. 60 tablet 0  . amitriptyline (ELAVIL) 50 MG tablet Take 1 tablet (50 mg total) by mouth at bedtime. 30 tablet 3  . aspirin 81 MG EC tablet Take 81 mg by mouth daily.      . carisoprodol (SOMA) 350 MG tablet Take 1 tablet (350 mg total) by mouth daily as needed for muscle spasms. 30 tablet 3  . Cholecalciferol (VITAMIN D3) 1000 UNITS CAPS Take 1 capsule (1,000 Units total)  by mouth daily. 30 capsule 2  . CINNAMON PO Take 1 tablet by mouth 2 (two) times daily.     Marland Kitchen dextromethorphan-guaiFENesin (MUCINEX DM) 30-600 MG per 12 hr tablet Take 1 tablet by mouth 2 (two) times daily.    Marland Kitchen docusate sodium (COLACE) 100 MG capsule Take 100 mg by mouth daily as needed for mild constipation or moderate constipation.    . Fluticasone-Salmeterol (ADVAIR DISKUS) 250-50 MCG/DOSE AEPB Inhale 1 puff into the lungs 2 (two) times daily. 180 each 4  . hydrochlorothiazide (HYDRODIURIL) 25 MG tablet Take 0.5 tablets (12.5 mg total) by mouth daily. 45 tablet 3  . HYDROcodone-acetaminophen (NORCO/VICODIN) 5-325 MG per tablet Take 1-2 tablets by mouth every 4 (four) hours as needed for moderate pain. (  Patient taking differently: Take 2 tablets by mouth every 4 (four) hours as needed for moderate pain. ) 15 tablet 0  . hydroxypropyl methylcellulose (ISOPTO TEARS) 2.5 % ophthalmic solution Place 2 drops into both eyes 3 (three) times daily as needed. Dry eye    . meclizine (ANTIVERT) 12.5 MG tablet Take 1 tablet (12.5 mg total) by mouth 2 (two) times daily as needed for dizziness. 180 tablet 0  . Multiple Vitamin (MULTIVITAMIN) tablet Take 1 tablet by mouth daily.    Marland Kitchen NIFEdipine (ADALAT CC) 90 MG 24 hr tablet Take 1 tablet (90 mg total) by mouth daily. 90 tablet 2  . omeprazole (PRILOSEC) 20 MG capsule Take 1 capsule (20 mg total) by mouth 2 (two) times daily. 180 capsule 3  . potassium chloride SA (K-DUR,KLOR-CON) 20 MEQ tablet Take 2 tablets (40 mEq total) by mouth 2 (two) times daily. (Patient taking differently: Take 20 mEq by mouth 2 (two) times daily. ) 60 tablet 0  . Red Yeast Rice Extract (RED YEAST RICE PO) Take 1 tablet by mouth 2 (two) times daily.     . Rivaroxaban (XARELTO) 15 MG TABS tablet Take 1 tablet (15 mg total) by mouth 2 (two) times daily with a meal. 42 tablet 0  . sertraline (ZOLOFT) 100 MG tablet Take 1 tablet (100 mg total) by mouth 2 (two) times daily. 180 tablet 1  .  tiotropium (SPIRIVA HANDIHALER) 18 MCG inhalation capsule Place 1 capsule (18 mcg total) into inhaler and inhale daily. 90 capsule 4  . topiramate (TOPAMAX) 50 MG tablet Take 1 tablet (50 mg total) by mouth 2 (two) times daily. 60 tablet 2  . [START ON 06/19/2015] rivaroxaban (XARELTO) 20 MG TABS tablet Take 1 tablet (20 mg total) by mouth daily with supper. 30 tablet 0   Current Meds: Scheduled Meds: . amiodarone  200 mg Oral BID  . amitriptyline  50 mg Oral QHS  . bisacodyl  10 mg Rectal Daily  . hydrochlorothiazide  12.5 mg Oral Daily  . mometasone-formoterol  2 puff Inhalation BID  . NIFEdipine  90 mg Oral Daily  . pantoprazole  40 mg Oral Daily  . piperacillin-tazobactam (ZOSYN)  IV  3.375 g Intravenous 3 times per day  . senna-docusate  1 tablet Oral BID  . sertraline  100 mg Oral BID  . sodium chloride  3 mL Intravenous Q12H  . tiotropium  18 mcg Inhalation Daily  . topiramate  50 mg Oral BID   Continuous Infusions:  PRN Meds:.albuterol, ALPRAZolam, morphine injection, promethazine, sodium phosphate  albuterol, ALPRAZolam, morphine injection, promethazine, sodium phosphate  Results for orders placed or performed during the hospital encounter of 06/02/15 (from the past 48 hour(s))  Comprehensive metabolic panel     Status: Abnormal   Collection Time: 06/02/15 11:51 PM  Result Value Ref Range   Sodium 133 (L) 135 - 145 mmol/L   Potassium 3.9 3.5 - 5.1 mmol/L   Chloride 100 (L) 101 - 111 mmol/L   CO2 25 22 - 32 mmol/L   Glucose, Bld 188 (H) 65 - 99 mg/dL   BUN 12 6 - 20 mg/dL   Creatinine, Ser 0.73 0.44 - 1.00 mg/dL   Calcium 9.3 8.9 - 10.3 mg/dL   Total Protein 7.2 6.5 - 8.1 g/dL   Albumin 2.8 (L) 3.5 - 5.0 g/dL   AST 188 (H) 15 - 41 U/L   ALT 94 (H) 14 - 54 U/L   Alkaline Phosphatase 566 (H) 38 - 126  U/L   Total Bilirubin 1.5 (H) 0.3 - 1.2 mg/dL   GFR calc non Af Amer >60 >60 mL/min   GFR calc Af Amer >60 >60 mL/min    Comment: (NOTE) The eGFR has been calculated  using the CKD EPI equation. This calculation has not been validated in all clinical situations. eGFR's persistently <60 mL/min signify possible Chronic Kidney Disease.    Anion gap 8 5 - 15  Lipase, blood     Status: Abnormal   Collection Time: 06/02/15 11:51 PM  Result Value Ref Range   Lipase 11 (L) 22 - 51 U/L  CBC with Differential/Platelet     Status: Abnormal   Collection Time: 06/02/15 11:51 PM  Result Value Ref Range   WBC 15.4 (H) 4.0 - 10.5 K/uL   RBC 3.41 (L) 3.87 - 5.11 MIL/uL   Hemoglobin 11.0 (L) 12.0 - 15.0 g/dL   HCT 33.8 (L) 36.0 - 46.0 %   MCV 99.1 78.0 - 100.0 fL   MCH 32.3 26.0 - 34.0 pg   MCHC 32.5 30.0 - 36.0 g/dL   RDW 14.4 11.5 - 15.5 %   Platelets 412 (H) 150 - 400 K/uL   Neutrophils Relative % 79 (H) 43 - 77 %   Lymphocytes Relative 12 12 - 46 %   Monocytes Relative 9 3 - 12 %   Eosinophils Relative 0 0 - 5 %   Basophils Relative 0 0 - 1 %   Neutro Abs 12.2 (H) 1.7 - 7.7 K/uL   Lymphs Abs 1.8 0.7 - 4.0 K/uL   Monocytes Absolute 1.4 (H) 0.1 - 1.0 K/uL   Eosinophils Absolute 0.0 0.0 - 0.7 K/uL   Basophils Absolute 0.0 0.0 - 0.1 K/uL   WBC Morphology MILD LEFT SHIFT (1-5% METAS, OCC MYELO, OCC BANDS)   Urinalysis, Routine w reflex microscopic (not at [facility])     Status: Abnormal   Collection Time: 06/03/15 12:27 AM  Result Value Ref Range   Color, Urine YELLOW YELLOW   APPearance CLOUDY (A) CLEAR   Specific Gravity, Urine 1.010 1.005 - 1.030   pH 7.0 5.0 - 8.0   Glucose, UA NEGATIVE NEGATIVE mg/dL   Hgb urine dipstick NEGATIVE NEGATIVE   Bilirubin Urine NEGATIVE NEGATIVE   Ketones, ur NEGATIVE NEGATIVE mg/dL   Protein, ur NEGATIVE NEGATIVE mg/dL   Urobilinogen, UA 1.0 0.0 - 1.0 mg/dL   Nitrite NEGATIVE NEGATIVE   Leukocytes, UA TRACE (A) NEGATIVE  Urine microscopic-add on     Status: None   Collection Time: 06/03/15 12:27 AM  Result Value Ref Range   WBC, UA 0-2 <3 WBC/hpf   Bacteria, UA RARE RARE  Culture, body fluid-bottle     Status: None  (Preliminary result)   Collection Time: 06/03/15  3:36 AM  Result Value Ref Range   Specimen Description BILE    Special Requests NONE    Gram Stain      GRAM NEGATIVE RODS IN BOTH AEROBIC AND ANAEROBIC BOTTLES GRAM STAIN REVIEWED-AGREE WITH RESULT MVESTAL GRAM POSITIVE COCCI IN CHAINS ANAEROBIC BOTTLE ONLY    Culture PSEUDOMONAS AERUGINOSA    Report Status PENDING   Gram stain     Status: None   Collection Time: 06/03/15  3:36 AM  Result Value Ref Range   Specimen Description BILE    Special Requests NONE    Gram Stain      FEW WBC PRESENT, PREDOMINANTLY PMN MODERATE GRAM NEGATIVE RODS Gram Stain Report Called to,Read Back By and Verified  With: T SPENCER,RN AT 1300 06/03/15 BY K BARR    Report Status 06/03/2015 FINAL   Comprehensive metabolic panel     Status: Abnormal   Collection Time: 06/03/15 10:11 AM  Result Value Ref Range   Sodium 134 (L) 135 - 145 mmol/L   Potassium 3.3 (L) 3.5 - 5.1 mmol/L   Chloride 102 101 - 111 mmol/L   CO2 22 22 - 32 mmol/L   Glucose, Bld 174 (H) 65 - 99 mg/dL   BUN 7 6 - 20 mg/dL   Creatinine, Ser 0.60 0.44 - 1.00 mg/dL   Calcium 8.9 8.9 - 10.3 mg/dL   Total Protein 7.5 6.5 - 8.1 g/dL   Albumin 2.5 (L) 3.5 - 5.0 g/dL   AST 522 (H) 15 - 41 U/L   ALT 253 (H) 14 - 54 U/L   Alkaline Phosphatase 738 (H) 38 - 126 U/L   Total Bilirubin 2.8 (H) 0.3 - 1.2 mg/dL   GFR calc non Af Amer >60 >60 mL/min   GFR calc Af Amer >60 >60 mL/min    Comment: (NOTE) The eGFR has been calculated using the CKD EPI equation. This calculation has not been validated in all clinical situations. eGFR's persistently <60 mL/min signify possible Chronic Kidney Disease.    Anion gap 10 5 - 15  CBC     Status: Abnormal   Collection Time: 06/03/15 10:11 AM  Result Value Ref Range   WBC 16.0 (H) 4.0 - 10.5 K/uL   RBC 3.42 (L) 3.87 - 5.11 MIL/uL   Hemoglobin 11.1 (L) 12.0 - 15.0 g/dL   HCT 33.0 (L) 36.0 - 46.0 %   MCV 96.5 78.0 - 100.0 fL   MCH 32.5 26.0 - 34.0 pg    MCHC 33.6 30.0 - 36.0 g/dL   RDW 14.2 11.5 - 15.5 %   Platelets 449 (H) 150 - 400 K/uL   Ct Abdomen Pelvis W Contrast  06/03/2015   CLINICAL DATA:  Epigastric abdominal pain. History of bloody drainage from cholecystostomy tube.  EXAM: CT ABDOMEN AND PELVIS WITH CONTRAST  TECHNIQUE: Multidetector CT imaging of the abdomen and pelvis was performed using the standard protocol following bolus administration of intravenous contrast.  CONTRAST:  132m OMNIPAQUE IOHEXOL 300 MG/ML  SOLN  COMPARISON:  Most recent CT 05/27/2015  FINDINGS: Minimal atelectasis at the left lung base.  Percutaneous cholecystostomy tube in place. Do intraluminal hyperdensities, 1 in the gallbladder neck, 1 adjacent to the cholecystostomy tube, may reflect gallstones. There is mild interval distention the gallbladder compared to prior CT. Unchanged degree of extrahepatic biliary prominence, the common bile duct measures 1.4 cm, the contents of the common bile duct appear increased in density compared to prior. Small fluid collection just anterior to the cholecystostomy tube in the liver is slightly larger than on prior exam, currently 3.3 x 2.5 x 4.4 cm, previously 2.3 x 3.0 x 4.5 cm. There is minimal surrounding soft tissue stranding. There is probable thrombosis of portal venous branches in the left lobe of the liver that appears similar to prior exam.  Fatty atrophy of the pancreas, unchanged from prior. Left adrenal hypertrophy unchanged, right adrenal gland is normal. Spleen is normal. Kidneys demonstrate symmetric enhancement and excretion on delayed phase imaging.  Stomach physiologically distended. There are no dilated or thickened bowel loops. Moderate stool throughout the colon. Diverticulosis of the distal colon without diverticulitis. Sigmoid colon is tortuous. There is no free air or ascites.  Or unchanged aneurysm of the infrarenal abdominal  aorta measuring up to 3.7 cm. There is a centric mural thrombus and diffuse atheromatous  calcification. No retroperitoneal adenopathy.  Within the pelvis the bladder is physiologically distended. Uterus is surgically absent. Multiple pelvic phleboliths. No pelvic free fluid. No adnexal mass.  Scoliosis and degenerative change throughout the spine. There are no acute or suspicious osseous abnormalities.  IMPRESSION: 1. Cholecystostomy tube remains in place. Unchanged biliary prominence, however the density in the common bile duct is increased compared to prior, which may reflect complex fluid, hemorrhage or infection. Alternatively, vicarious excretion of contrast related to recent prior CT is considered, but fell is likely. 2. Small fluid collection adjacent to the right lobe of the liver has minimally increased in size from prior exam, there is a small amount of surrounding inflammation. Hematoma or biloma again considered. Given the surrounding inflammation, superimposed infection is not excluded. 3. 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 4. Infrarenal abdominal aortic aneurysm. Follow-up ultrasound in 2 years recommended.   Electronically Signed   By: Jeb Levering M.D.   On: 06/03/2015 02:42   Ir Cm Inj Any Colonic Tube W/fluoro  06/03/2015   CLINICAL DATA:  History of acute cholecystitis, post ultrasound fluoroscopic guided cholecystostomy tube placement on 04/26/2015.  Patient has been anticoagulated for concern of portal vein thrombosis involving the distal aspect of the intrahepatic portal venous system of the left lobe of the liver.  Since cholecystostomy tube placement, the patient has had persistent bloody output for which she underwent a fluoroscopic guided cholangiogram performed 05/25/19 16 which demonstrated multiple blood products within the gallbladder fossa.  Patient now with elevated LFTs with bloody output surrounding the cholecystostomy tube. Request made for repeat fluoroscopic guided cholangiogram peer  EXAM: GI TUBE  INJECTION  COMPARISON:  CT abdomen pelvis - 06/03/2015; 05/27/2015; 04/25/2015; ultrasound fluoroscopic guided cholecystostomy tube placement - 04/26/2015; cholangiogram via existing cholecystostomy tube -05/25/2015  CONTRAST:  65m OMNIPAQUE IOHEXOL 300 MG/ML SOLN - administered via the existing cholecystostomy tube  FLUOROSCOPY TIME:  1 minutes, 18 seconds (92 mGy)  TECHNIQUE: The patient was positioned supine on the fluoroscopy table. A preprocedural spot fluoroscopic image was obtained of the right upper abdominal quadrant an existing percutaneous cholecystostomy tube.  Multiple spot fluoroscopic and radiographic images were obtained of the right upper abdominal quadrant following the injection of small amount of contrast via the existing cholecystostomy tube.  Images were reviewed and the procedure was terminated. Note, patient became nauseous following the injection of a small amount of contrast resulting in significant image degradation.  The cholecystostomy tube was secured in place with a new Stat Lock device.  The cholecystostomy tube was flushed with a small amount of saline and reconnected to the gravity bag.  FINDINGS: Preprocedural spot fluoroscopic image demonstrates unchanged positioning of the cholecystostomy tube overlying the right upper abdominal quadrant.  Contrast injection demonstrates near complete replacement of the gallbladder lumen with ill-defined filling defects favored to represent blood products.  There is minimal opacification of the cystic duct which opacifies the common bile and hepatic ducts which likewise R field with occlusive debris favored to represent blood products. Note, the patient became nauseous following the injection of contrast via the cholecystostomy tube, resulting in significant image degradation.  IMPRESSION: 1. Appropriately positioned cholecystostomy tube with end coiled and locked within the gallbladder fundus. No exchange performed. 2. Percutaneous  cholangiogram demonstrates complete opacification of the gallbladder lumen as well as the opacified portions of the  cystic, common hepatic and common bile ducts with ill-defined filling defects favored to represent blood products.  PLAN: Given the very minimal amount of residual largely nonocclusive thrombus within the central aspect of the left intrahepatic biliary system, would advise against continued aggressive anti coagulation.  If patient is liver function tests continued to rise, would consider ERCP for potential temporizing biliary stent placement.  If patient continues to experience bloody drainage surrounding the cholecystostomy tube, this could be up sized to a 12 [place] percutaneous drain, however would advise this procedure be performed with sedation secondary to patient's inability to tolerate today's percutaneous cholangiogram.  Above findings discussed with Randon Goldsmith, CCS PA, at the time of procedure completion.   Electronically Signed   By: Sandi Mariscal M.D.   On: 06/03/2015 17:08    ROS:  General:no colds or fevers, no weight changes Skin:no rashes or ulcers HEENT:no blurred vision, no congestion, chronic dizziness due to meniere's dz CV:see HPI PUL:see HPI GI: +N/V, +abdominal pain GU:no hematuria, no dysuria JS:HFWYOVZ diffuse muscle and joint pain due to fibromyalgia Neuro:no syncope, no lightheadedness Endo:no diabetes, no thyroid disease   Blood pressure 155/79, pulse 105, temperature 98.5 F (36.9 C), temperature source Oral, resp. rate 20, height _0  (1.6 m), weight 235 lb 10.8 oz (106.9 kg), SpO2 95 %.  Wt Readings from Last 3 Encounters:  06/03/15 235 lb 10.8 oz (106.9 kg)  06/02/15 224 lb (101.606 kg)  05/27/15 228 lb 2.8 oz (103.5 kg)    PE: General: Sleepy, lying with wash cloth over face, answers direct questions, but not conversant Skin:Warm and dry, brisk capillary refill HEENT:normocephalic, sclera clear, mucus membranes moist Neck:supple, no JVD, no bruits    Heart:S1S2 RRR without murmur, gallup, rub or click Lungs:clear without rales, rhonchi, or wheezes CHY:IFOY, tender, hypoactive BS, do not palpate liver spleen or masses Ext:no lower ext edema, 2+ pedal pulses, 2+ radial pulses Neuro:alert and oriented, MAE, follows commands, + facial symmetry Tele: SR  Assessment/Plan Principal Problem:   Complication of previous biliary non-surgical procedure Active Problems:   ANXIETY DISORDER, GENERALIZED   Depression   Obstructive sleep apnea   Meniere's disease   Essential hypertension   Diastolic heart failure   COPD with emphysema   Gastroesophageal reflux disease   Paroxysmal atrial fibrillation   Hepatic vein thrombosis   Cholecystostomy drain infection   Prediabetes  1. Cardiac clearance low risk from cardiac perspective except for increased risk for atrial fibrillation. Continue amiodarone, consider IV if pt NPO for more than 24 hours. Resume Xarelto as soon as possible post op. Maintain potassium >=4. MD to see.  Claretha Cooper  Nurse Practitioner Certified [place] Pager [phone] or after 5pm or weekends call [phone] 06/04/2015, 1:09 PM   I seen and evaluated the patient along with Ms. Phylliss Bob, NP as well as Cecilie Kicks, NP-C. I agree with the findings, examination recommendations above. We are asked to see this very pleasant 73 year old woman who unfortunately has not done well with expectant management of her cholecystitis. Percutaneous drainage tube was, acute by hemobilia. Now appears to be necessary for her to undergo cholecystectomy. The patient did develop atrial fibrillation while sick during her last hospitalization and was started on amiodarone. This led to chemical conversion she's been on anticoagulation. Now with accommodation of a portal vein thrombus plus atrial fibrillation, full endocrine correlation is definitely warranted. As far as preoperative evaluation, her echocardiogram was  relatively normal, she has not had any anginal or  heart failure symptoms. She has been taking twice a day amiodarone and is therefore less likely to have atrial fibrillation. This would be the major concern postoperatively would be A. fib plus minus RVR. In that setting would treat postop A. fib with either IV amiodarone plus or minus IV beta blockers. From preoperative evaluation standpoint, she does not need any further cardiac evaluation for potential ischemia as this would only serve to delay her necessary surgery. No active ischemic symptoms.   We'll be available for assistance postoperatively if the need arises.  She would be a low risk patient for a high-intermediate risk surgery (intra-abdominal).  No risk factors of diabetes, renal insufficiency, prior CVA, coronary disease or heart failure. Recommendation would be to proceed with the OR monitoring for signs of possible A. fib RVR.  Restart anticoagulation with stable postop.  Replete K+  HARDING, Leonie Green, MD Leonie Man, M.D., M.S. Interventional Cardiologist   Pager # [phone]

## 2015-06-04 NOTE — Progress Notes (Signed)
Subjective:  Had cholangiogram yesterday per cholecystostomy tube with IR.there is a nonocclusive thrombus within central aspect of left intrahepatic biliary system, IR advices against anticoag for this given she is bleeding.  Saw opacification of gallbladder lumen as well as opacified portion of cystic, Common hepatic and CBD with ill-defined filling defect favoring blood products. Had a run of SVT last night 192 per RN note, but now in HR 105. Other vitals are stable, no fever.  Had blood clots inside the cholecystostomy bag.   Bile gram stain growing GNR. LFT's going up. Could be blood clot in the biliary system causing LFT elevation.   Continues to have some abdominal pain. Daughter had lot of questions and tried to answer most of them. I stated that we will get in touch with other consultants to see if we can give here more answers.   Objective: Vital signs in last 24 hours: Filed Vitals:   06/03/15 2258 06/03/15 2347 06/04/15 0546 06/04/15 0741  BP:  178/91 155/79   Pulse:  98 105   Temp: 98.2 F (36.8 C)  98.5 F (36.9 C)   TempSrc:   Oral   Resp:   20   Height:      Weight:      SpO2: 100% 95% 96% 95%   Weight change:   Intake/Output Summary (Last 24 hours) at 06/04/15 0746 Last data filed at 06/04/15 2595  Gross per 24 hour  Intake    590 ml  Output    200 ml  Net    390 ml   Vitals reviewed. General: resting in bed, NAD HEENT: PERRL, EOMI, no scleral icterus Cardiac: RRR, no rubs, murmurs or gallops Pulm: clear to auscultation bilaterally, no wheezes, rales, or rhonchi Abd: cholecystostomy site has some dried blood but no active bleeding, covered by gauge. Cholecystostomy bag has ~150 cc blood in it right now. Has generalized tenderness on the abdomen, more than the RML, less than yesterday. Ext: warm and well perfused, no pedal edema Neuro: alert and oriented X3, cranial nerves II-XII grossly intact  Lab Results: Basic Metabolic Panel:  Recent Labs Lab  05/28/15 1627 05/29/15 0530 05/30/15 0530 06/02/15 2351 06/03/15 1011  NA 134* 134* 137 133* 134*  K 3.0* 3.2* 4.0 3.9 3.3*  CL 104 104 106 100* 102  CO2 23 24 26 25 22   GLUCOSE 134* 129* 105* 188* 174*  BUN 8 5* <5* 12 7  CREATININE 0.55 0.57 0.58 0.73 0.60  CALCIUM 8.3* 8.6* 8.9 9.3 8.9  MG 1.7  --  2.0  --   --   PHOS  --  2.5  --   --   --    Liver Function Tests:  Recent Labs Lab 06/02/15 2351 06/03/15 1011  AST 188* 522*  ALT 94* 253*  ALKPHOS 566* 738*  BILITOT 1.5* 2.8*  PROT 7.2 7.5  ALBUMIN 2.8* 2.5*    Recent Labs Lab 06/02/15 2351  LIPASE 11*   No results for input(s): AMMONIA in the last 168 hours. CBC:  Recent Labs Lab 05/29/15 0530  06/02/15 2351 06/03/15 1011  WBC 13.1*  < > 15.4* 16.0*  NEUTROABS 10.2*  --  12.2*  --   HGB 9.5*  < > 11.0* 11.1*  HCT 28.7*  < > 33.8* 33.0*  MCV 97.6  < > 99.1 96.5  PLT 210  < > 412* 449*  < > = values in this interval not displayed. Hemoglobin A1C:  Recent Labs Lab 05/29/15  0530  HGBA1C 6.0*   Fasting Lipid Panel: No results for input(s): CHOL, HDL, LDLCALC, TRIG, CHOLHDL, LDLDIRECT in the last 168 hours. Thyroid Function Tests:  Recent Labs Lab 05/29/15 1225  TSH 2.895   Coagulation:  Recent Labs Lab 05/29/15 1225  LABPROT 18.0*  INR 1.48   Anemia Panel: No results for input(s): VITAMINB12, FOLATE, FERRITIN, TIBC, IRON, RETICCTPCT in the last 168 hours. Urine Drug Screen: Drugs of Abuse  No results found for: LABOPIA, COCAINSCRNUR, LABBENZ, AMPHETMU, THCU, LABBARB  Alcohol Level: No results for input(s): ETH in the last 168 hours. Urinalysis:  Recent Labs Lab 06/03/15 0027  COLORURINE YELLOW  LABSPEC 1.010  PHURINE 7.0  GLUCOSEU NEGATIVE  HGBUR NEGATIVE  BILIRUBINUR NEGATIVE  KETONESUR NEGATIVE  PROTEINUR NEGATIVE  UROBILINOGEN 1.0  NITRITE NEGATIVE  LEUKOCYTESUR TRACE*   Misc. Labs:  Micro Results: Recent Results (from the past 240 hour(s))  MRSA PCR Screening      Status: Abnormal   Collection Time: 05/27/15 12:59 AM  Result Value Ref Range Status   MRSA by PCR POSITIVE (A) NEGATIVE Final    Comment:        The GeneXpert MRSA Assay (FDA approved for NASAL specimens only), is one component of a comprehensive MRSA colonization surveillance program. It is not intended to diagnose MRSA infection nor to guide or monitor treatment for MRSA infections. RESULT CALLED TO, READ BACK BY AND VERIFIED WITH: W.WORK RN [phone] 05/28/15 E.GADDY   Blood Culture (routine x 2)     Status: None   Collection Time: 05/27/15  5:55 PM  Result Value Ref Range Status   Specimen Description BLOOD RIGHT HAND  Final   Special Requests BOTTLES DRAWN AEROBIC AND ANAEROBIC 5CC  Final   Culture NO GROWTH 5 DAYS  Final   Report Status 06/01/2015 FINAL  Final  Blood Culture (routine x 2)     Status: None   Collection Time: 05/27/15  5:58 PM  Result Value Ref Range Status   Specimen Description BLOOD LEFT HAND  Final   Special Requests BOTTLES DRAWN AEROBIC ONLY 2CC  Final   Culture NO GROWTH 5 DAYS  Final   Report Status 06/01/2015 FINAL  Final  Culture, body fluid-bottle     Status: None (Preliminary result)   Collection Time: 06/03/15  3:36 AM  Result Value Ref Range Status   Specimen Description BILE  Final   Special Requests NONE  Final   Gram Stain   Final    GRAM NEGATIVE RODS AEROBIC BOTTLE ONLY GRAM STAIN REVIEWED-AGREE WITH RESULT MVESTAL    Culture PENDING  Incomplete   Report Status PENDING  Incomplete  Gram stain     Status: None   Collection Time: 06/03/15  3:36 AM  Result Value Ref Range Status   Specimen Description BILE  Final   Special Requests NONE  Final   Gram Stain   Final    FEW WBC PRESENT, PREDOMINANTLY PMN MODERATE GRAM NEGATIVE RODS Gram Stain Report Called to,Read Back By and Verified With: T SPENCER,RN AT 1300 06/03/15 BY K BARR    Report Status 06/03/2015 FINAL  Final   Studies/Results: Ct Abdomen Pelvis W Contrast  06/03/2015    CLINICAL DATA:  Epigastric abdominal pain. History of bloody drainage from cholecystostomy tube.  EXAM: CT ABDOMEN AND PELVIS WITH CONTRAST  TECHNIQUE: Multidetector CT imaging of the abdomen and pelvis was performed using the standard protocol following bolus administration of intravenous contrast.  CONTRAST:  185mL OMNIPAQUE IOHEXOL 300 MG/ML  SOLN  COMPARISON:  Most recent CT 05/27/2015  FINDINGS: Minimal atelectasis at the left lung base.  Percutaneous cholecystostomy tube in place. Do intraluminal hyperdensities, 1 in the gallbladder neck, 1 adjacent to the cholecystostomy tube, may reflect gallstones. There is mild interval distention the gallbladder compared to prior CT. Unchanged degree of extrahepatic biliary prominence, the common bile duct measures 1.4 cm, the contents of the common bile duct appear increased in density compared to prior. Small fluid collection just anterior to the cholecystostomy tube in the liver is slightly larger than on prior exam, currently 3.3 x 2.5 x 4.4 cm, previously 2.3 x 3.0 x 4.5 cm. There is minimal surrounding soft tissue stranding. There is probable thrombosis of portal venous branches in the left lobe of the liver that appears similar to prior exam.  Fatty atrophy of the pancreas, unchanged from prior. Left adrenal hypertrophy unchanged, right adrenal gland is normal. Spleen is normal. Kidneys demonstrate symmetric enhancement and excretion on delayed phase imaging.  Stomach physiologically distended. There are no dilated or thickened bowel loops. Moderate stool throughout the colon. Diverticulosis of the distal colon without diverticulitis. Sigmoid colon is tortuous. There is no free air or ascites.  Or unchanged aneurysm of the infrarenal abdominal aorta measuring up to 3.7 cm. There is a centric mural thrombus and diffuse atheromatous calcification. No retroperitoneal adenopathy.  Within the pelvis the bladder is physiologically distended. Uterus is surgically absent.  Multiple pelvic phleboliths. No pelvic free fluid. No adnexal mass.  Scoliosis and degenerative change throughout the spine. There are no acute or suspicious osseous abnormalities.  IMPRESSION: 1. Cholecystostomy tube remains in place. Unchanged biliary prominence, however the density in the common bile duct is increased compared to prior, which may reflect complex fluid, hemorrhage or infection. Alternatively, vicarious excretion of contrast related to recent prior CT is considered, but fell is likely. 2. Small fluid collection adjacent to the right lobe of the liver has minimally increased in size from prior exam, there is a small amount of surrounding inflammation. Hematoma or biloma again considered. Given the surrounding inflammation, superimposed infection is not excluded. 3. 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 4. Infrarenal abdominal aortic aneurysm. Follow-up ultrasound in 2 years recommended.   Electronically Signed   By: Jeb Levering M.D.   On: 06/03/2015 02:42   Ir Cm Inj Any Colonic Tube W/fluoro  06/03/2015   CLINICAL DATA:  History of acute cholecystitis, post ultrasound fluoroscopic guided cholecystostomy tube placement on 04/26/2015.  Patient has been anticoagulated for concern of portal vein thrombosis involving the distal aspect of the intrahepatic portal venous system of the left lobe of the liver.  Since cholecystostomy tube placement, the patient has had persistent bloody output for which she underwent a fluoroscopic guided cholangiogram performed 05/25/19 16 which demonstrated multiple blood products within the gallbladder fossa.  Patient now with elevated LFTs with bloody output surrounding the cholecystostomy tube. Request made for repeat fluoroscopic guided cholangiogram peer  EXAM: GI TUBE INJECTION  COMPARISON:  CT abdomen pelvis - 06/03/2015; 05/27/2015; 04/25/2015; ultrasound fluoroscopic guided cholecystostomy tube  placement - 04/26/2015; cholangiogram via existing cholecystostomy tube -05/25/2015  CONTRAST:  59mL OMNIPAQUE IOHEXOL 300 MG/ML SOLN - administered via the existing cholecystostomy tube  FLUOROSCOPY TIME:  1 minutes, 18 seconds (92 mGy)  TECHNIQUE: The patient was positioned supine on the fluoroscopy table. A preprocedural spot fluoroscopic image was obtained of the right upper abdominal quadrant an existing percutaneous cholecystostomy  tube.  Multiple spot fluoroscopic and radiographic images were obtained of the right upper abdominal quadrant following the injection of small amount of contrast via the existing cholecystostomy tube.  Images were reviewed and the procedure was terminated. Note, patient became nauseous following the injection of a small amount of contrast resulting in significant image degradation.  The cholecystostomy tube was secured in place with a new Stat Lock device.  The cholecystostomy tube was flushed with a small amount of saline and reconnected to the gravity bag.  FINDINGS: Preprocedural spot fluoroscopic image demonstrates unchanged positioning of the cholecystostomy tube overlying the right upper abdominal quadrant.  Contrast injection demonstrates near complete replacement of the gallbladder lumen with ill-defined filling defects favored to represent blood products.  There is minimal opacification of the cystic duct which opacifies the common bile and hepatic ducts which likewise R field with occlusive debris favored to represent blood products. Note, the patient became nauseous following the injection of contrast via the cholecystostomy tube, resulting in significant image degradation.  IMPRESSION: 1. Appropriately positioned cholecystostomy tube with end coiled and locked within the gallbladder fundus. No exchange performed. 2. Percutaneous cholangiogram demonstrates complete opacification of the gallbladder lumen as well as the opacified portions of the cystic, common hepatic and  common bile ducts with ill-defined filling defects favored to represent blood products.  PLAN: Given the very minimal amount of residual largely nonocclusive thrombus within the central aspect of the left intrahepatic biliary system, would advise against continued aggressive anti coagulation.  If patient is liver function tests continued to rise, would consider ERCP for potential temporizing biliary stent placement.  If patient continues to experience bloody drainage surrounding the cholecystostomy tube, this could be up sized to a 12 [place] percutaneous drain, however would advise this procedure be performed with sedation secondary to patient's inability to tolerate today's percutaneous cholangiogram.  Above findings discussed with Randon Goldsmith, CCS PA, at the time of procedure completion.   Electronically Signed   By: Sandi Mariscal M.D.   On: 06/03/2015 17:08   Medications: I have reviewed the patient's current medications. Scheduled Meds: . amiodarone  200 mg Oral BID  . amitriptyline  50 mg Oral QHS  . bisacodyl  10 mg Rectal Daily  . hydrochlorothiazide  12.5 mg Oral Daily  . mometasone-formoterol  2 puff Inhalation BID  . NIFEdipine  90 mg Oral Daily  . pantoprazole  40 mg Oral Daily  . piperacillin-tazobactam (ZOSYN)  IV  3.375 g Intravenous 3 times per day  . senna-docusate  1 tablet Oral BID  . sertraline  100 mg Oral BID  . sodium chloride  3 mL Intravenous Q12H  . tiotropium  18 mcg Inhalation Daily  . topiramate  50 mg Oral BID   Continuous Infusions:   PRN Meds:.albuterol, ALPRAZolam, morphine injection, promethazine, sodium phosphate Assessment/Plan: Principal Problem:   Complication of previous biliary non-surgical procedure Active Problems:   ANXIETY DISORDER, GENERALIZED   Depression   Obstructive sleep apnea   Meniere's disease   Essential hypertension   Diastolic heart failure   COPD with emphysema   Gastroesophageal reflux disease   Paroxysmal atrial fibrillation    Hepatic vein thrombosis   Cholecystostomy drain infection   Prediabetes  74 yo female with cholecystostomy bag placed for cholecystitis, recent portal vein thrombosis, afib, on anticoag here with cholecystostomy site bleeding.  Cholecystostomy tube bleeding - around the skin with suture loosening and also internally as seen around the biliary system.  had tube palced on 5/20  by IR, came last admission with bleeding as well, was found to have portal vein thrombosis, was placed on xarelto and discharged to rehab for portal vein thrombosis and also Afib indications.  Came back with bleeding at the cholecystostomy site, also with n/v, and ab pain.  CT abdomen showed increased density in CBD which could be complex fluid vs hemorrhage or infection. Surgery team and IR was consulted. - appreciate surgery and IR recs. IR did cholangiogram 6/28 which showed complete opacification of gallbladder lumen, opacified cystic, common hepatic, and common bile ducts, likely blood products. Saw a very minimal amount of largely nonocclusive thrombus within central aspect of left intrahepatic biliary system. IR advised against anticoag given risk of further bleeding. Surgery also advised against anticoag.  Stated if continues to bleed surrounding the cholecystostomy tube site then tube would need to be upsized to 12 frenc under sedation. Also recommended ERCP if LFTs are going up. - consulted GI since LFT's are going up for possible ERCP or other recs. - f/up IR and surgery recs - Bile cx and gram stain growing GNR. continue zosyn for now. - hold anticoag.  - continue pain management with morhpine 1mg  q4hrf or pain. - trend CBC- overall stable at 11.   Recent portal vein thrombosis - hold xarelto for now as she had bleeding at cholecystostomy site, also per IR, not too clear if would need anticoag for this, this may have been a soft call.  Afib with RVR - chads2vas 3 - likely was initiated by sepsis/acute portal  vein thrombosis last admisison - cont amio for now. - hold xarelto as above  HTN - at home on HCTZ 12.5mg  daily and nifedipine 90mg  daily.  - resume home meds   CHF - last TTE 04/2015 w/ EF 55-60% with grade 1 diastolic dysfunction  COPD--on advair 1 puff bid, spiriva 18 mch inh daily, and albuterol 1-2 puff q6hprn -cont dulera1 puff bid, spiriva 18 mch inh daily, and albuterol 1-2 puff q6hprn  Fibromyalgia- at home she takes norco 7.5-325mg  QID prn, amitriptyline 50mg  qhs, carisoprodol 350mg  qd prn - pain management as above  OSA- cont CPAP qhs  Anxiety and depression-- cont home zoloft 100mg  BID, elavil 50mg  qhs, and xanax 0.5mg  BID oprn  Migraines- cont home topamax 50mg  BID  Pre-diabetes -hgba1c 6.0 - lifestyle mod.  Diet: clear liquid for now.  Code: full.  Dispo: Disposition is deferred at this time, awaiting improvement of current medical problems.  Anticipated discharge in approximately 1-2 day(s).   The patient does have a current PCP Bertha Stakes, MD) and does need an [facility] hospital follow-up appointment after discharge.  The patient does have transportation limitations that hinder transportation to clinic appointments.  .Services Needed at time of discharge: Y = Yes, Blank = No PT:   OT:   RN:   Equipment:   Other:     LOS: 1 day   Dellia Nims, MD 06/04/2015, 7:46 AM

## 2015-06-04 NOTE — Progress Notes (Signed)
Patient ID: Jennifer [name], female   DOB: Jun 27, 1941, 74 y.o.   MRN: [id number]    Subjective: Pt doesn't feel well today.  Nauseated.    Objective: Vital signs in last 24 hours: Temp:  [98.1 F (36.7 C)-98.5 F (36.9 C)] 98.5 F (36.9 C) (06/29 0546) Pulse Rate:  [98-105] 105 (06/29 0546) Resp:  [20] 20 (06/29 0546) BP: (155-178)/(79-94) 155/79 mmHg (06/29 0546) SpO2:  [95 %-100 %] 95 % (06/29 0741) Last BM Date: 06/03/15  Intake/Output from previous day: 06/28 0701 - 06/29 0700 In: 590 [P.O.:540; IV Piggyback:50] Out: 200 [Drains:200] Intake/Output this shift:    PE: Abd: soft, minimal pain, +BS, obese, drain with bloody, clotted output  Lab Results:   Recent Labs  06/02/15 2351 06/03/15 1011  WBC 15.4* 16.0*  HGB 11.0* 11.1*  HCT 33.8* 33.0*  PLT 412* 449*   BMET  Recent Labs  06/02/15 2351 06/03/15 1011  NA 133* 134*  K 3.9 3.3*  CL 100* 102  CO2 25 22  GLUCOSE 188* 174*  BUN 12 7  CREATININE 0.73 0.60  CALCIUM 9.3 8.9   PT/INR No results for input(s): LABPROT, INR in the last 72 hours. CMP     Component Value Date/Time   NA 134* 06/03/2015 1011   K 3.3* 06/03/2015 1011   CL 102 06/03/2015 1011   CO2 22 06/03/2015 1011   GLUCOSE 174* 06/03/2015 1011   BUN 7 06/03/2015 1011   CREATININE 0.60 06/03/2015 1011   CREATININE 0.61 12/12/2014 1228   CALCIUM 8.9 06/03/2015 1011   PROT 7.5 06/03/2015 1011   ALBUMIN 2.5* 06/03/2015 1011   AST 522* 06/03/2015 1011   ALT 253* 06/03/2015 1011   ALKPHOS 738* 06/03/2015 1011   BILITOT 2.8* 06/03/2015 1011   GFRNONAA >60 06/03/2015 1011   GFRNONAA 89 12/12/2014 1228   GFRAA >60 06/03/2015 1011   GFRAA >89 12/12/2014 1228   Lipase     Component Value Date/Time   LIPASE 11* 06/02/2015 2351       Studies/Results: Ct Abdomen Pelvis W Contrast  06/03/2015   CLINICAL DATA:  Epigastric abdominal pain. History of bloody drainage from cholecystostomy tube.  EXAM: CT ABDOMEN AND PELVIS WITH CONTRAST   TECHNIQUE: Multidetector CT imaging of the abdomen and pelvis was performed using the standard protocol following bolus administration of intravenous contrast.  CONTRAST:  175mL OMNIPAQUE IOHEXOL 300 MG/ML  SOLN  COMPARISON:  Most recent CT 05/27/2015  FINDINGS: Minimal atelectasis at the left lung base.  Percutaneous cholecystostomy tube in place. Do intraluminal hyperdensities, 1 in the gallbladder neck, 1 adjacent to the cholecystostomy tube, may reflect gallstones. There is mild interval distention the gallbladder compared to prior CT. Unchanged degree of extrahepatic biliary prominence, the common bile duct measures 1.4 cm, the contents of the common bile duct appear increased in density compared to prior. Small fluid collection just anterior to the cholecystostomy tube in the liver is slightly larger than on prior exam, currently 3.3 x 2.5 x 4.4 cm, previously 2.3 x 3.0 x 4.5 cm. There is minimal surrounding soft tissue stranding. There is probable thrombosis of portal venous branches in the left lobe of the liver that appears similar to prior exam.  Fatty atrophy of the pancreas, unchanged from prior. Left adrenal hypertrophy unchanged, right adrenal gland is normal. Spleen is normal. Kidneys demonstrate symmetric enhancement and excretion on delayed phase imaging.  Stomach physiologically distended. There are no dilated or thickened bowel loops. Moderate stool throughout the colon. Diverticulosis  of the distal colon without diverticulitis. Sigmoid colon is tortuous. There is no free air or ascites.  Or unchanged aneurysm of the infrarenal abdominal aorta measuring up to 3.7 cm. There is a centric mural thrombus and diffuse atheromatous calcification. No retroperitoneal adenopathy.  Within the pelvis the bladder is physiologically distended. Uterus is surgically absent. Multiple pelvic phleboliths. No pelvic free fluid. No adnexal mass.  Scoliosis and degenerative change throughout the spine. There are no  acute or suspicious osseous abnormalities.  IMPRESSION: 1. Cholecystostomy tube remains in place. Unchanged biliary prominence, however the density in the common bile duct is increased compared to prior, which may reflect complex fluid, hemorrhage or infection. Alternatively, vicarious excretion of contrast related to recent prior CT is considered, but fell is likely. 2. Small fluid collection adjacent to the right lobe of the liver has minimally increased in size from prior exam, there is a small amount of surrounding inflammation. Hematoma or biloma again considered. Given the surrounding inflammation, superimposed infection is not excluded. 3. 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 4. Infrarenal abdominal aortic aneurysm. Follow-up ultrasound in 2 years recommended.   Electronically Signed   By: Jeb Levering M.D.   On: 06/03/2015 02:42   Ir Cm Inj Any Colonic Tube W/fluoro  06/03/2015   CLINICAL DATA:  History of acute cholecystitis, post ultrasound fluoroscopic guided cholecystostomy tube placement on 04/26/2015.  Patient has been anticoagulated for concern of portal vein thrombosis involving the distal aspect of the intrahepatic portal venous system of the left lobe of the liver.  Since cholecystostomy tube placement, the patient has had persistent bloody output for which she underwent a fluoroscopic guided cholangiogram performed 05/25/19 16 which demonstrated multiple blood products within the gallbladder fossa.  Patient now with elevated LFTs with bloody output surrounding the cholecystostomy tube. Request made for repeat fluoroscopic guided cholangiogram peer  EXAM: GI TUBE INJECTION  COMPARISON:  CT abdomen pelvis - 06/03/2015; 05/27/2015; 04/25/2015; ultrasound fluoroscopic guided cholecystostomy tube placement - 04/26/2015; cholangiogram via existing cholecystostomy tube -05/25/2015  CONTRAST:  1mL OMNIPAQUE IOHEXOL 300 MG/ML SOLN -  administered via the existing cholecystostomy tube  FLUOROSCOPY TIME:  1 minutes, 18 seconds (92 mGy)  TECHNIQUE: The patient was positioned supine on the fluoroscopy table. A preprocedural spot fluoroscopic image was obtained of the right upper abdominal quadrant an existing percutaneous cholecystostomy tube.  Multiple spot fluoroscopic and radiographic images were obtained of the right upper abdominal quadrant following the injection of small amount of contrast via the existing cholecystostomy tube.  Images were reviewed and the procedure was terminated. Note, patient became nauseous following the injection of a small amount of contrast resulting in significant image degradation.  The cholecystostomy tube was secured in place with a new Stat Lock device.  The cholecystostomy tube was flushed with a small amount of saline and reconnected to the gravity bag.  FINDINGS: Preprocedural spot fluoroscopic image demonstrates unchanged positioning of the cholecystostomy tube overlying the right upper abdominal quadrant.  Contrast injection demonstrates near complete replacement of the gallbladder lumen with ill-defined filling defects favored to represent blood products.  There is minimal opacification of the cystic duct which opacifies the common bile and hepatic ducts which likewise R field with occlusive debris favored to represent blood products. Note, the patient became nauseous following the injection of contrast via the cholecystostomy tube, resulting in significant image degradation.  IMPRESSION: 1. Appropriately positioned cholecystostomy tube with end coiled and locked within  the gallbladder fundus. No exchange performed. 2. Percutaneous cholangiogram demonstrates complete opacification of the gallbladder lumen as well as the opacified portions of the cystic, common hepatic and common bile ducts with ill-defined filling defects favored to represent blood products.  PLAN: Given the very minimal amount of residual  largely nonocclusive thrombus within the central aspect of the left intrahepatic biliary system, would advise against continued aggressive anti coagulation.  If patient is liver function tests continued to rise, would consider ERCP for potential temporizing biliary stent placement.  If patient continues to experience bloody drainage surrounding the cholecystostomy tube, this could be up sized to a 12 [place] percutaneous drain, however would advise this procedure be performed with sedation secondary to patient's inability to tolerate today's percutaneous cholangiogram.  Above findings discussed with Randon Goldsmith, CCS PA, at the time of procedure completion.   Electronically Signed   By: Sandi Mariscal M.D.   On: 06/03/2015 17:08    Anti-infectives: Anti-infectives    Start     Dose/Rate Route Frequency Ordered Stop   06/03/15 1200  piperacillin-tazobactam (ZOSYN) IVPB 3.375 g     3.375 g 12.5 mL/hr over 240 Minutes Intravenous 3 times per day 06/03/15 0700     06/03/15 0315  piperacillin-tazobactam (ZOSYN) IVPB 3.375 g     3.375 g 100 mL/hr over 30 Minutes Intravenous  Once 06/03/15 0313 06/03/15 0428       Assessment/Plan   H/o Acute cholecystitis - s/p perc chole tube 04/25/15 CBD obstruction  Transaminitis  Elevated Alk Phos  Leukocytosis  Elevated bilirubin  ?Left hepatic vein/PV thrombosis AFIB on Xarelto -cont to hold Xarelto -cholangiogram reveals a patent gallbladder, cystic duct, and common hepatic duct.  Her CBD did opacify, but also had ill-defined filling defects that could represent partial obstruction secondary to bloody product. -her LFTs are trending up consistent with an obstructive pattern.  We would recommend GI to see the patient for ERCP for stent or clearing of her duct. check CMET today and labs in am -If skin continues to bleed around tube could obtain "snow" from OR to the surface of the skin to help prevent bleeding of the skin. -No plans for surgical intervention at this  time. Would continue perc chole tube for 6-8 weeks before consideration of removal. If consideration for surgery at that time is appropriate then Cards clearence would need to be obtained  LOS: 1 day    Jeromiah Ohalloran E 06/04/2015, 11:32 AM Pager: [phone]

## 2015-06-04 NOTE — Progress Notes (Addendum)
  PROGRESS NOTE MEDICINE TEACHING ATTENDING   Day 1 of stay Patient name: Jennifer [name]   Medical record number: [id number] Date of birth: Oct 02, 1941   Drained clots of blood from C-tube. Nausea +, abdominal pain controlled.   Blood pressure 155/79, pulse 105, temperature 98.5 F (36.9 C), temperature source Oral, resp. rate 20, height 5\' 3"  (1.6 m), weight 235 lb 10.8 oz (106.9 kg), SpO2 95 %. The patient is alert and oriented, comfortable, in no acute distress. PERRL, EOMI. Heart exhibits regular rate and rhythm, no murmurs. Lungs are clear to auscultation. Abdomen is soft and non-tender. There is no pedal edema and good pedal pulses. There are no gross focal neurological deficits apparent.   Assessment/Plan Pre-op evaluation for surgery - The patient has been evaluated by medicine team for surgery previously as well, on 04/25/15 when she was admitted initially for acute cholecystitis. At that time, she had a sustained run of ventricular tachycardia which precluded immediate surgery. Subsequently, she received a percutaneous cholecystostomy, which was complicated by bleeding leading to readmission on 05/27/15. This admission was complicated by SVT and new onset rapid Afib. The patient also has a history of CHF (most recent ECHO 04/26/15 EF 83-38% grade 1 diastolic dysfunction, mildly dilated aorta) Thus, cardiology clearance is imperative for this patient.   Pulmonary wise, the patient has oxygen dependent COPD, FEV1 66% of predicted in 2014, and moderate to severe OSA  (followed by Dr Joya Gaskins outpatient). However, the patient has not used home oxygen for a while now and is on room air at this time as well. She does have increased risk of pulmonary complications, however surgery is not contraindicated. I think with repeated complications to the cholecystostomy, and increasing LFTs, disabling quality of life conditions, with repeated hospital admmisions, it might be the only definitive choice we have.   I  went to the patient's room again at 3:30pm to explain the risk to the patient and her daughters and let them decide. One of the daughters verbalized understanding about the risks involved and seemed to be very knowledgeable about post op cardiac and pulmonary complications in COPD patients. The daughters also told me that they have talked to the cardiologist and are satisfied with his evaluation of the patient. The other daughter was unpleasant and rude because she thought that all physicians are very patronizing, and thought that I was disagreeing with the cardiologist when I said that the risk of the patient is higher than a normal adult, but I think she was worried and upset as well. Ultimately, since the patient is competent, she will sign the consent papers after reading them. The family overall is very frustrated about this repeated hospitalization due to cholecystitis. However, I have made them aware of the risks involved and they seem to understand it well.  I have discussed the care of this patient with my IM team residents. Please see the resident note for details.  Madilyn Fireman 06/04/2015, 2:34 PM.

## 2015-06-04 NOTE — Clinical Social Work Note (Signed)
CSW was informed by Lynelle Smoke at [facility] that patient is from facility.  CSW did not have time to meet with patient to complete assessment, CSW will see in the morning.  Jones Broom. Oglesby, MSW, [place] 06/04/2015 4:52 PM

## 2015-06-04 NOTE — Progress Notes (Addendum)
0600 Went to drain biliary tube was plugged with large clot was able to remove but still had difficulty due to patient had several long blood clots coming out of the bag. Drainage bag is now patent.Dr. Hulen Luster was notified about the blood clots. Will continue to monitor.

## 2015-06-04 NOTE — Progress Notes (Signed)
RT placed patient on CPAP. Pt tolerating well at this time.

## 2015-06-04 NOTE — Progress Notes (Addendum)
Patient had a run of SVT heart rate 192 at 2300. Vital signs at 2348 B/P 178/91 HR 98 O2 sat was 95% on 2l/m nasal cannula. Patient voices no c/o of discomfort. Dr. Hulen Luster notified no orders receive. Patient resting at present. Will continue to monitor.

## 2015-06-04 NOTE — Progress Notes (Signed)
Placed patient on CPAP at 17cm with oxygen set at 3lpm. Will continue to monitor patient

## 2015-06-05 DIAGNOSIS — B965 Pseudomonas (aeruginosa) (mallei) (pseudomallei) as the cause of diseases classified elsewhere: Secondary | ICD-10-CM

## 2015-06-05 DIAGNOSIS — T889XXS Complication of surgical and medical care, unspecified, sequela: Secondary | ICD-10-CM

## 2015-06-05 LAB — COMPREHENSIVE METABOLIC PANEL
ALBUMIN: 2 g/dL — AB (ref 3.5–5.0)
ALT: 310 U/L — AB (ref 14–54)
ALT: 284 U/L — ABNORMAL HIGH (ref 14–54)
ANION GAP: 9 (ref 5–15)
AST: 301 U/L — ABNORMAL HIGH (ref 15–41)
AST: 233 U/L — ABNORMAL HIGH (ref 15–41)
Albumin: 2 g/dL — ABNORMAL LOW (ref 3.5–5.0)
Alkaline Phosphatase: 683 U/L — ABNORMAL HIGH (ref 38–126)
Alkaline Phosphatase: 655 U/L — ABNORMAL HIGH (ref 38–126)
Anion gap: 9 (ref 5–15)
BILIRUBIN TOTAL: 4.2 mg/dL — AB (ref 0.3–1.2)
BUN: 13 mg/dL (ref 6–20)
BUN: 15 mg/dL (ref 6–20)
CALCIUM: 8.9 mg/dL (ref 8.9–10.3)
CALCIUM: 8.8 mg/dL — AB (ref 8.9–10.3)
CHLORIDE: 98 mmol/L — AB (ref 101–111)
CO2: 25 mmol/L (ref 22–32)
CO2: 25 mmol/L (ref 22–32)
Chloride: 98 mmol/L — ABNORMAL LOW (ref 101–111)
Creatinine, Ser: 0.62 mg/dL (ref 0.44–1.00)
Creatinine, Ser: 0.73 mg/dL (ref 0.44–1.00)
GFR calc Af Amer: >60 mL/min (ref >60)
GFR calc Af Amer: >60 mL/min (ref >60)
GFR calc non Af Amer: >60 mL/min (ref >60)
GLUCOSE: 134 mg/dL — AB (ref 65–99)
Glucose, Bld: 129 mg/dL — ABNORMAL HIGH (ref 65–99)
Potassium: 2.4 mmol/L — CL (ref 3.5–5.1)
Potassium: 2.9 mmol/L — ABNORMAL LOW (ref 3.5–5.1)
SODIUM: 132 mmol/L — AB (ref 135–145)
Sodium: 132 mmol/L — ABNORMAL LOW (ref 135–145)
Total Bilirubin: 3 mg/dL — ABNORMAL HIGH (ref 0.3–1.2)
Total Protein: 6.2 g/dL — ABNORMAL LOW (ref 6.5–8.1)
Total Protein: 5.9 g/dL — ABNORMAL LOW (ref 6.5–8.1)

## 2015-06-05 LAB — CBC
HEMATOCRIT: 28 % — AB (ref 36.0–46.0)
HEMOGLOBIN: 9.5 g/dL — AB (ref 12.0–15.0)
MCH: 31.9 pg (ref 26.0–34.0)
MCHC: 33.9 g/dL (ref 30.0–36.0)
MCV: 94 fL (ref 78.0–100.0)
Platelets: 406 10*3/uL — ABNORMAL HIGH (ref 150–400)
RBC: 2.98 MIL/uL — ABNORMAL LOW (ref 3.87–5.11)
RDW: 14 % (ref 11.5–15.5)
WBC: 27.6 10*3/uL — AB (ref 4.0–10.5)

## 2015-06-05 LAB — BASIC METABOLIC PANEL
Anion gap: 10 (ref 5–15)
BUN: 14 mg/dL (ref 6–20)
CALCIUM: 8.6 mg/dL — AB (ref 8.9–10.3)
CO2: 25 mmol/L (ref 22–32)
Chloride: 97 mmol/L — ABNORMAL LOW (ref 101–111)
Creatinine, Ser: 0.68 mg/dL (ref 0.44–1.00)
GFR calc non Af Amer: >60 mL/min (ref >60)
GLUCOSE: 113 mg/dL — AB (ref 65–99)
POTASSIUM: 3 mmol/L — AB (ref 3.5–5.1)
SODIUM: 132 mmol/L — AB (ref 135–145)

## 2015-06-05 LAB — MAGNESIUM: MAGNESIUM: 2 mg/dL (ref 1.7–2.4)

## 2015-06-05 MED ORDER — POTASSIUM CHLORIDE 10 MEQ/100ML IV SOLN: 10.0000 meq | INTRAVENOUS | Status: DC

## 2015-06-05 MED ORDER — POTASSIUM CHLORIDE 10 MEQ/100ML IV SOLN
10.0000 meq | INTRAVENOUS | Status: DC
  Administered 2015-06-05 (×2): 10 meq via INTRAVENOUS
  Filled 2015-06-05 (×2): qty 100

## 2015-06-05 MED ORDER — POTASSIUM CHLORIDE 10 MEQ/100ML IV SOLN
10.0000 meq | INTRAVENOUS | Status: AC
  Administered 2015-06-05 (×6): 10 meq via INTRAVENOUS
  Filled 2015-06-05 (×6): qty 100

## 2015-06-05 MED ORDER — POTASSIUM CHLORIDE CRYS ER 10 MEQ PO TBCR
40.0000 meq | EXTENDED_RELEASE_TABLET | Freq: Every day | ORAL | Status: DC
  Administered 2015-06-05 – 2015-06-07 (×2): 40 meq via ORAL
  Filled 2015-06-05 (×2): qty 4

## 2015-06-05 MED ORDER — POTASSIUM CHLORIDE 10 MEQ/100ML IV SOLN
10.0000 meq | INTRAVENOUS | Status: AC
  Administered 2015-06-05 – 2015-06-06 (×4): 10 meq via INTRAVENOUS
  Filled 2015-06-05 (×3): qty 100

## 2015-06-05 NOTE — Progress Notes (Addendum)
Met and examined patient. Fast asleep. Snoring, arouses to verbal cues.  General: No acute distress.  CV: S1S2 RRR, no murmur Lungs: Bilateral Vesicular breath sounds, anterior auscultation.  Abdomen: Soft, non-tender, benign, C-tube draining blood.  Plan is to go for surgery in the AM when K>3.2. Repeat check K in evening - hold further repletion to allow for change to be reflected. Hold HCTZ, Rest per Dr Brandt Loosen note. Dr Dareen Piano to assume care from tomorrow. Bile growing pseudomonas - continue IV zocyn. Madilyn Fireman MD MPH 06/05/2015 3:07 PM

## 2015-06-05 NOTE — Care Management (Signed)
Important Message  Patient Details  Name: Jennifer [name] MRN: [id number] Date of Birth: 02-23-1941   Medicare Important Message Given:   06-05-15    Marilu Favre, RN 06/05/2015, 3:04 PM

## 2015-06-05 NOTE — Progress Notes (Signed)
At 2247 pt HR 137, junctional tachy, assessed pt verbalized she needs pain medication and a little bit nauseous, morphine 2mg  IV given and phenergan 12.5 IV given.

## 2015-06-05 NOTE — Progress Notes (Signed)
[facility] Progress Note     Subjective: Pts pain improved minimally today with pain meds.  Daughters at bedside.  She has been nauseous, but tolerated Kdur.  Resting in bed today.    Objective: Vital signs in last 24 hours: Temp:  [98.3 F (36.8 C)-98.5 F (36.9 C)] 98.5 F (36.9 C) (06/30 0639) Pulse Rate:  [87-93] 87 (06/30 0639) Resp:  [18] 18 (06/30 0639) BP: (110-134)/(61-74) 128/61 mmHg (06/30 0941) SpO2:  [97 %-99 %] 99 % (06/30 0639) Last BM Date: 06/03/15  Intake/Output from previous day: 06/29 0701 - 06/30 0700 In: 0  Out: 115 [Drains:115] Intake/Output this shift:    PE: Gen:  Alert, NAD, pleasant Abd: Soft, mild distension, tenderness in RUQ and epigastrium, +BS, no HSM   Lab Results:   Recent Labs  06/04/15 1330 06/05/15 0327  WBC 39.6* 27.6*  HGB 9.9* 9.5*  HCT 28.7* 28.0*  PLT 399 406*   BMET  Recent Labs  06/04/15 1329 06/05/15 0327  NA 131* 132*  K 2.2* 2.4*  CL 96* 98*  CO2 24 25  GLUCOSE 141* 134*  BUN 8 13  CREATININE 0.53 0.62  CALCIUM 8.9 8.9   PT/INR No results for input(s): LABPROT, INR in the last 72 hours. CMP     Component Value Date/Time   NA 132* 06/05/2015 0327   K 2.4* 06/05/2015 0327   CL 98* 06/05/2015 0327   CO2 25 06/05/2015 0327   GLUCOSE 134* 06/05/2015 0327   BUN 13 06/05/2015 0327   CREATININE 0.62 06/05/2015 0327   CREATININE 0.61 12/12/2014 1228   CALCIUM 8.9 06/05/2015 0327   PROT 6.2* 06/05/2015 0327   ALBUMIN 2.0* 06/05/2015 0327   AST 301* 06/05/2015 0327   ALT 310* 06/05/2015 0327   ALKPHOS 683* 06/05/2015 0327   BILITOT 4.2* 06/05/2015 0327   GFRNONAA >60 06/05/2015 0327   GFRNONAA 89 12/12/2014 1228   GFRAA >60 06/05/2015 0327   GFRAA >89 12/12/2014 1228   Lipase     Component Value Date/Time   LIPASE 11* 06/04/2015 1329       Studies/Results: Ir Cm Inj Any Colonic Tube W/fluoro  06/03/2015   CLINICAL DATA:  History of acute cholecystitis, post ultrasound  fluoroscopic guided cholecystostomy tube placement on 04/26/2015.  Patient has been anticoagulated for concern of portal vein thrombosis involving the distal aspect of the intrahepatic portal venous system of the left lobe of the liver.  Since cholecystostomy tube placement, the patient has had persistent bloody output for which she underwent a fluoroscopic guided cholangiogram performed 05/25/19 16 which demonstrated multiple blood products within the gallbladder fossa.  Patient now with elevated LFTs with bloody output surrounding the cholecystostomy tube. Request made for repeat fluoroscopic guided cholangiogram peer  EXAM: GI TUBE INJECTION  COMPARISON:  CT abdomen pelvis - 06/03/2015; 05/27/2015; 04/25/2015; ultrasound fluoroscopic guided cholecystostomy tube placement - 04/26/2015; cholangiogram via existing cholecystostomy tube -05/25/2015  CONTRAST:  37mL OMNIPAQUE IOHEXOL 300 MG/ML SOLN - administered via the existing cholecystostomy tube  FLUOROSCOPY TIME:  1 minutes, 18 seconds (92 mGy)  TECHNIQUE: The patient was positioned supine on the fluoroscopy table. A preprocedural spot fluoroscopic image was obtained of the right upper abdominal quadrant an existing percutaneous cholecystostomy tube.  Multiple spot fluoroscopic and radiographic images were obtained of the right upper abdominal quadrant following the injection of small amount of contrast via the existing cholecystostomy tube.  Images were reviewed and the procedure was terminated. Note, patient became nauseous following the injection  of a small amount of contrast resulting in significant image degradation.  The cholecystostomy tube was secured in place with a new Stat Lock device.  The cholecystostomy tube was flushed with a small amount of saline and reconnected to the gravity bag.  FINDINGS: Preprocedural spot fluoroscopic image demonstrates unchanged positioning of the cholecystostomy tube overlying the right upper abdominal quadrant.  Contrast  injection demonstrates near complete replacement of the gallbladder lumen with ill-defined filling defects favored to represent blood products.  There is minimal opacification of the cystic duct which opacifies the common bile and hepatic ducts which likewise R field with occlusive debris favored to represent blood products. Note, the patient became nauseous following the injection of contrast via the cholecystostomy tube, resulting in significant image degradation.  IMPRESSION: 1. Appropriately positioned cholecystostomy tube with end coiled and locked within the gallbladder fundus. No exchange performed. 2. Percutaneous cholangiogram demonstrates complete opacification of the gallbladder lumen as well as the opacified portions of the cystic, common hepatic and common bile ducts with ill-defined filling defects favored to represent blood products.  PLAN: Given the very minimal amount of residual largely nonocclusive thrombus within the central aspect of the left intrahepatic biliary system, would advise against continued aggressive anti coagulation.  If patient is liver function tests continued to rise, would consider ERCP for potential temporizing biliary stent placement.  If patient continues to experience bloody drainage surrounding the cholecystostomy tube, this could be up sized to a 12 [place] percutaneous drain, however would advise this procedure be performed with sedation secondary to patient's inability to tolerate today's percutaneous cholangiogram.  Above findings discussed with Randon Goldsmith, CCS PA, at the time of procedure completion.   Electronically Signed   By: Sandi Mariscal M.D.   On: 06/03/2015 17:08    Anti-infectives: Anti-infectives    Start     Dose/Rate Route Frequency Ordered Stop   06/03/15 1200  piperacillin-tazobactam (ZOSYN) IVPB 3.375 g     3.375 g 12.5 mL/hr over 240 Minutes Intravenous 3 times per day 06/03/15 0700     06/03/15 0315  piperacillin-tazobactam (ZOSYN) IVPB 3.375 g      3.375 g 100 mL/hr over 30 Minutes Intravenous  Once 06/03/15 0313 06/03/15 0428       Assessment/Plan H/o Acute cholecystitis - s/p perc chole tube 04/25/15 CBD obstruction  Transaminitis  Elevated Alk Phos  Leukocytosis  Elevated bilirubin  ?Left hepatic vein/PV thrombosis AFIB on Xarelto -Cont to hold Xarelto -Cholangiogram reveals a patent gallbladder, cystic duct, and common hepatic duct. Her CBD did opacify, but also had ill-defined filling defects that could represent partial obstruction secondary to bloody product. -Her LFTs minimally down today, but still consistent with an obstructive pattern. GI hesitant to perform ERCP. -Plan was to operate today - lap chole with IOC, however her potassium is extremely low 2.4.  It needs to be at least above 3.0.  Have given her 6 additional runs this am plus 37meq Kdur.  She has tolerated this so far.  Will recheck lab at 1pm to see if its possible to operate today.  Keep NPO.    LOS: 2 days    Nat Christen 06/05/2015, 11:06 AM Pager: [phone]

## 2015-06-05 NOTE — Progress Notes (Signed)
Subjective:  Continuing to have bleeding at the cholecystostomy bag. Vitals stable, no fever, WBC trending down. LFT's trending down.  Ab pain is better now. hgb is stable. K+ was low at 2.2 yesterday and 2.4 this AM. Repleting. Checking mag and K+.   Per surgery, plan is OR today if K+ corrects to >3.    Objective: Vital signs in last 24 hours: Filed Vitals:   06/04/15 0741 06/04/15 2215 06/05/15 0639 06/05/15 0941  BP:  134/74 110/61 128/61  Pulse:  93 87   Temp:  98.3 F (36.8 C) 98.5 F (36.9 C)   TempSrc:  Oral Oral   Resp:  18 18   Height:      Weight:      SpO2: 95% 97% 99%    Weight change:   Intake/Output Summary (Last 24 hours) at 06/05/15 1227 Last data filed at 06/05/15 0928  Gross per 24 hour  Intake      0 ml  Output    115 ml  Net   -115 ml   Vitals reviewed. General: resting in bed, NAD HEENT: PERRL, EOMI, no scleral icterus Cardiac: RRR, no rubs, murmurs or gallops Pulm: clear to auscultation bilaterally, no wheezes, rales, or rhonchi Abd: cholecystostomy site has some dried blood but no active bleeding, covered by gauge. Cholecystostomy bag has ~60 cc blood in it right now. Has generalized tenderness on the abdomen, less than yesterday. Ext: warm and well perfused, no pedal edema Neuro: alert and oriented X3, cranial nerves II-XII grossly intact  Lab Results: Basic Metabolic Panel:  Recent Labs Lab 05/30/15 0530  06/04/15 1329 06/05/15 0327  NA 137  < > 131* 132*  K 4.0  < > 2.2* 2.4*  CL 106  < > 96* 98*  CO2 26  < > 24 25  GLUCOSE 105*  < > 141* 134*  BUN <5*  < > 8 13  CREATININE 0.58  < > 0.53 0.62  CALCIUM 8.9  < > 8.9 8.9  MG 2.0  --   --   --   < > = values in this interval not displayed. Liver Function Tests:  Recent Labs Lab 06/04/15 1329 06/05/15 0327  AST 416* 301*  ALT 384* 310*  ALKPHOS 733* 683*  BILITOT 5.0* 4.2*  PROT 6.6 6.2*  ALBUMIN 2.2* 2.0*    Recent Labs Lab 06/02/15 2351 06/04/15 1329  LIPASE  11* 11*   No results for input(s): AMMONIA in the last 168 hours. CBC:  Recent Labs Lab 06/02/15 2351  06/04/15 1330 06/05/15 0327  WBC 15.4*  < > 39.6* 27.6*  NEUTROABS 12.2*  --   --   --   HGB 11.0*  < > 9.9* 9.5*  HCT 33.8*  < > 28.7* 28.0*  MCV 99.1  < > 93.8 94.0  PLT 412*  < > 399 406*  < > = values in this interval not displayed.   Recent Labs Lab 06/03/15 0027  COLORURINE YELLOW  LABSPEC 1.010  PHURINE 7.0  GLUCOSEU NEGATIVE  HGBUR NEGATIVE  BILIRUBINUR NEGATIVE  KETONESUR NEGATIVE  PROTEINUR NEGATIVE  UROBILINOGEN 1.0  NITRITE NEGATIVE  LEUKOCYTESUR TRACE*   Misc. Labs:  Micro Results: Recent Results (from the past 240 hour(s))  MRSA PCR Screening     Status: Abnormal   Collection Time: 05/27/15 12:59 AM  Result Value Ref Range Status   MRSA by PCR POSITIVE (A) NEGATIVE Final    Comment:  The GeneXpert MRSA Assay (FDA approved for NASAL specimens only), is one component of a comprehensive MRSA colonization surveillance program. It is not intended to diagnose MRSA infection nor to guide or monitor treatment for MRSA infections. RESULT CALLED TO, READ BACK BY AND VERIFIED WITH: W.WORK RN [phone] 05/28/15 E.GADDY   Blood Culture (routine x 2)     Status: None   Collection Time: 05/27/15  5:55 PM  Result Value Ref Range Status   Specimen Description BLOOD RIGHT HAND  Final   Special Requests BOTTLES DRAWN AEROBIC AND ANAEROBIC 5CC  Final   Culture NO GROWTH 5 DAYS  Final   Report Status 06/01/2015 FINAL  Final  Blood Culture (routine x 2)     Status: None   Collection Time: 05/27/15  5:58 PM  Result Value Ref Range Status   Specimen Description BLOOD LEFT HAND  Final   Special Requests BOTTLES DRAWN AEROBIC ONLY 2CC  Final   Culture NO GROWTH 5 DAYS  Final   Report Status 06/01/2015 FINAL  Final  Culture, body fluid-bottle     Status: None (Preliminary result)   Collection Time: 06/03/15  3:36 AM  Result Value Ref Range Status   Specimen  Description BILE  Final   Special Requests NONE  Final   Gram Stain   Final    GRAM NEGATIVE RODS IN BOTH AEROBIC AND ANAEROBIC BOTTLES GRAM STAIN REVIEWED-AGREE WITH RESULT MVESTAL GRAM POSITIVE COCCI IN CHAINS ANAEROBIC BOTTLE ONLY    Culture PSEUDOMONAS AERUGINOSA GRAM POSITIVE COCCI   Final   Report Status PENDING  Incomplete   Organism ID, Bacteria PSEUDOMONAS AERUGINOSA  Final      Susceptibility   Pseudomonas aeruginosa - MIC*    CEFTAZIDIME 4 SENSITIVE Sensitive     CIPROFLOXACIN >=4 RESISTANT Resistant     GENTAMICIN <=1 SENSITIVE Sensitive     IMIPENEM 2 SENSITIVE Sensitive     PIP/TAZO 8 SENSITIVE Sensitive     CEFEPIME 2 SENSITIVE Sensitive     * PSEUDOMONAS AERUGINOSA  Gram stain     Status: None   Collection Time: 06/03/15  3:36 AM  Result Value Ref Range Status   Specimen Description BILE  Final   Special Requests NONE  Final   Gram Stain   Final    FEW WBC PRESENT, PREDOMINANTLY PMN MODERATE GRAM NEGATIVE RODS Gram Stain Report Called to,Read Back By and Verified With: T SPENCER,RN AT 1300 06/03/15 BY K BARR    Report Status 06/03/2015 FINAL  Final   Studies/Results: Ir Cm Inj Any Colonic Tube W/fluoro  06/03/2015   CLINICAL DATA:  History of acute cholecystitis, post ultrasound fluoroscopic guided cholecystostomy tube placement on 04/26/2015.  Patient has been anticoagulated for concern of portal vein thrombosis involving the distal aspect of the intrahepatic portal venous system of the left lobe of the liver.  Since cholecystostomy tube placement, the patient has had persistent bloody output for which she underwent a fluoroscopic guided cholangiogram performed 05/25/19 16 which demonstrated multiple blood products within the gallbladder fossa.  Patient now with elevated LFTs with bloody output surrounding the cholecystostomy tube. Request made for repeat fluoroscopic guided cholangiogram peer  EXAM: GI TUBE INJECTION  COMPARISON:  CT abdomen pelvis - 06/03/2015;  05/27/2015; 04/25/2015; ultrasound fluoroscopic guided cholecystostomy tube placement - 04/26/2015; cholangiogram via existing cholecystostomy tube -05/25/2015  CONTRAST:  18mL OMNIPAQUE IOHEXOL 300 MG/ML SOLN - administered via the existing cholecystostomy tube  FLUOROSCOPY TIME:  1 minutes, 18 seconds (92 mGy)  TECHNIQUE: The  patient was positioned supine on the fluoroscopy table. A preprocedural spot fluoroscopic image was obtained of the right upper abdominal quadrant an existing percutaneous cholecystostomy tube.  Multiple spot fluoroscopic and radiographic images were obtained of the right upper abdominal quadrant following the injection of small amount of contrast via the existing cholecystostomy tube.  Images were reviewed and the procedure was terminated. Note, patient became nauseous following the injection of a small amount of contrast resulting in significant image degradation.  The cholecystostomy tube was secured in place with a new Stat Lock device.  The cholecystostomy tube was flushed with a small amount of saline and reconnected to the gravity bag.  FINDINGS: Preprocedural spot fluoroscopic image demonstrates unchanged positioning of the cholecystostomy tube overlying the right upper abdominal quadrant.  Contrast injection demonstrates near complete replacement of the gallbladder lumen with ill-defined filling defects favored to represent blood products.  There is minimal opacification of the cystic duct which opacifies the common bile and hepatic ducts which likewise R field with occlusive debris favored to represent blood products. Note, the patient became nauseous following the injection of contrast via the cholecystostomy tube, resulting in significant image degradation.  IMPRESSION: 1. Appropriately positioned cholecystostomy tube with end coiled and locked within the gallbladder fundus. No exchange performed. 2. Percutaneous cholangiogram demonstrates complete opacification of the gallbladder  lumen as well as the opacified portions of the cystic, common hepatic and common bile ducts with ill-defined filling defects favored to represent blood products.  PLAN: Given the very minimal amount of residual largely nonocclusive thrombus within the central aspect of the left intrahepatic biliary system, would advise against continued aggressive anti coagulation.  If patient is liver function tests continued to rise, would consider ERCP for potential temporizing biliary stent placement.  If patient continues to experience bloody drainage surrounding the cholecystostomy tube, this could be up sized to a 12 [place] percutaneous drain, however would advise this procedure be performed with sedation secondary to patient's inability to tolerate today's percutaneous cholangiogram.  Above findings discussed with Randon Goldsmith, CCS PA, at the time of procedure completion.   Electronically Signed   By: Sandi Mariscal M.D.   On: 06/03/2015 17:08   Medications: I have reviewed the patient's current medications. Scheduled Meds: . amiodarone  200 mg Oral BID  . amitriptyline  50 mg Oral QHS  . bisacodyl  10 mg Rectal Daily  . hydrochlorothiazide  12.5 mg Oral Daily  . mometasone-formoterol  2 puff Inhalation BID  . NIFEdipine  90 mg Oral Daily  . pantoprazole  40 mg Oral Daily  . piperacillin-tazobactam (ZOSYN)  IV  3.375 g Intravenous 3 times per day  . potassium chloride  40 mEq Oral Daily  . potassium chloride  10 mEq Intravenous Q1 Hr x 6  . senna-docusate  1 tablet Oral BID  . sertraline  100 mg Oral BID  . sodium chloride  3 mL Intravenous Q12H  . tiotropium  18 mcg Inhalation Daily  . topiramate  50 mg Oral BID   Continuous Infusions:   PRN Meds:.albuterol, ALPRAZolam, morphine injection, promethazine, sodium phosphate Assessment/Plan: Principal Problem:   Complication of previous biliary non-surgical procedure Active Problems:   ANXIETY DISORDER, GENERALIZED   Depression   Obstructive sleep apnea    Meniere's disease   Essential hypertension   Diastolic heart failure   COPD with emphysema   Gastroesophageal reflux disease   Paroxysmal atrial fibrillation   Hepatic vein thrombosis   Cholecystostomy drain infection   Prediabetes  74  yo female with cholecystostomy bag placed for cholecystitis, recent portal vein thrombosis, afib, on anticoag here with cholecystostomy site bleeding.  Cholecystostomy tube bleeding - around the skin with suture loosening and also internally as seen around the biliary system.  had tube palced on 5/20 by IR, came last admission with bleeding as well, was found to have portal vein thrombosis, was placed on xarelto and discharged to rehab for portal vein thrombosis and also Afib indications.  Came back with bleeding at the cholecystostomy site, also with n/v, and ab pain.  CT abdomen showed increased density in CBD which could be complex fluid vs hemorrhage or infection. Surgery team and IR was consulted. IR did cholangiogram 6/28 which showed complete opacification of gallbladder lumen, opacified cystic, common hepatic, and common bile ducts, likely blood products. Saw a very minimal amount of largely nonocclusive thrombus within central aspect of left intrahepatic biliary system.  LFTs are elevated, could be from hemobilia causing some obstruction. We consulted GI as well for possible ERCP. - appreciate GI, IR, and surgery recs. Plan for cholecystectomy today with intraoperative cholangiogram by Surgery - appreciate surgery and IR recs. I - Bile cx and gram stain growing GNR. continue zosyn for now. - hold anticoag.  - continue pain management with morhpine 1mg  q4hrf or pain. - trend CBC- overall stable at 11.   Biliary tract infection growing Pseudomonas - culture from bile is growing pseudomonas sensitive to zosyn (currently on this since admission).  Also sensitive to gentamicin - cont zosyn for now then consider deescalating to gentamicin after surgery.    Recent portal vein thrombosis - hold xarelto for now as she had bleeding at cholecystostomy site, also per IR, not too clear if would need anticoag for this, this may have been a soft call.  Afib with RVR - chads2vas 3 - likely was initiated by sepsis/acute portal vein thrombosis last admisison - cont amio for now. - hold xarelto as above  Hypokalemia - likely 2/2 to poor PO intake - repleted and rechecking. Checking mag as well.  -replete PRN.  HTN - at home on HCTZ 12.5mg  daily and nifedipine 90mg  daily.  - hold HCTZ b/c of hypokalemia. Cont nifedipine.   CHF - last TTE 04/2015 w/ EF 55-60% with grade 1 diastolic dysfunction  COPD--on advair 1 puff bid, spiriva 18 mch inh daily, and albuterol 1-2 puff q6hprn -cont dulera1 puff bid, spiriva 18 mch inh daily, and albuterol 1-2 puff q6hprn  Fibromyalgia- at home she takes norco 7.5-325mg  QID prn, amitriptyline 50mg  qhs, carisoprodol 350mg  qd prn - pain management as above  OSA- cont CPAP qhs  Anxiety and depression-- cont home zoloft 100mg  BID, elavil 50mg  qhs, and xanax 0.5mg  BID oprn  Migraines- cont home topamax 50mg  BID  Pre-diabetes -hgba1c 6.0 - lifestyle mod.  Diet: clear liquid for now.  Code: full.  Dispo: Disposition is deferred at this time, awaiting improvement of current medical problems.  Anticipated discharge in approximately 1-2 day(s).   The patient does have a current PCP Bertha Stakes, MD) and does need an [facility] hospital follow-up appointment after discharge.  The patient does have transportation limitations that hinder transportation to clinic appointments.  .Services Needed at time of discharge: Y = Yes, Blank = No PT:   OT:   RN:   Equipment:   Other:     LOS: 2 days   Dellia Nims, MD 06/05/2015, 12:27 PM

## 2015-06-06 ENCOUNTER — Inpatient Hospital Stay (HOSPITAL_COMMUNITY): Payer: Medicare HMO | Admitting: Certified Registered Nurse Anesthetist

## 2015-06-06 ENCOUNTER — Inpatient Hospital Stay (HOSPITAL_COMMUNITY): Payer: Medicare HMO

## 2015-06-06 ENCOUNTER — Encounter (HOSPITAL_COMMUNITY)
Admission: EM | Disposition: A | Discharge status: Skilled Nursing Facility | Payer: Self-pay | Source: Home / Self Care | Attending: Internal Medicine

## 2015-06-06 DIAGNOSIS — K838 Other specified diseases of biliary tract: Secondary | ICD-10-CM

## 2015-06-06 DIAGNOSIS — E876 Hypokalemia: Secondary | ICD-10-CM

## 2015-06-06 DIAGNOSIS — K831 Obstruction of bile duct: Secondary | ICD-10-CM | POA: Insufficient documentation

## 2015-06-06 DIAGNOSIS — I81 Portal vein thrombosis: Secondary | ICD-10-CM

## 2015-06-06 HISTORY — PX: CHOLECYSTECTOMY: SHX55

## 2015-06-06 LAB — COMPREHENSIVE METABOLIC PANEL
ALBUMIN: 2 g/dL — AB (ref 3.5–5.0)
ALK PHOS: 582 U/L — AB (ref 38–126)
ALT: 191 U/L — ABNORMAL HIGH (ref 14–54)
AST: 85 U/L — ABNORMAL HIGH (ref 15–41)
Anion gap: 10 (ref 5–15)
BILIRUBIN TOTAL: 1.6 mg/dL — AB (ref 0.3–1.2)
BUN: 16 mg/dL (ref 6–20)
CALCIUM: 8.8 mg/dL — AB (ref 8.9–10.3)
CO2: 24 mmol/L (ref 22–32)
Chloride: 97 mmol/L — ABNORMAL LOW (ref 101–111)
Creatinine, Ser: 0.71 mg/dL (ref 0.44–1.00)
GFR calc Af Amer: >60 mL/min (ref >60)
GLUCOSE: 97 mg/dL (ref 65–99)
Potassium: 3.4 mmol/L — ABNORMAL LOW (ref 3.5–5.1)
Sodium: 131 mmol/L — ABNORMAL LOW (ref 135–145)
Total Protein: 6.2 g/dL — ABNORMAL LOW (ref 6.5–8.1)

## 2015-06-06 LAB — MAGNESIUM: MAGNESIUM: 2 mg/dL (ref 1.7–2.4)

## 2015-06-06 SURGERY — LAPAROSCOPIC CHOLECYSTECTOMY WITH INTRAOPERATIVE CHOLANGIOGRAM
Anesthesia: General | Site: Abdomen

## 2015-06-06 MED ORDER — PROPOFOL 10 MG/ML IV BOLUS
INTRAVENOUS | Status: DC | PRN
  Administered 2015-06-06: 150 mg via INTRAVENOUS
  Administered 2015-06-06: 30 mg via INTRAVENOUS
  Administered 2015-06-06: 50 mg via INTRAVENOUS

## 2015-06-06 MED ORDER — GLYCOPYRROLATE 0.2 MG/ML IJ SOLN
INTRAMUSCULAR | Status: DC | PRN
  Administered 2015-06-06: .5 mg via INTRAVENOUS

## 2015-06-06 MED ORDER — LIDOCAINE HCL (CARDIAC) 20 MG/ML IV SOLN
INTRAVENOUS | Status: AC
  Filled 2015-06-06: qty 5

## 2015-06-06 MED ORDER — ALBUTEROL SULFATE HFA 108 (90 BASE) MCG/ACT IN AERS
INHALATION_SPRAY | RESPIRATORY_TRACT | Status: DC | PRN
  Administered 2015-06-06: 2 via RESPIRATORY_TRACT

## 2015-06-06 MED ORDER — GLYCOPYRROLATE 0.2 MG/ML IJ SOLN
INTRAMUSCULAR | Status: AC
  Filled 2015-06-06: qty 3

## 2015-06-06 MED ORDER — HYDROMORPHONE HCL 1 MG/ML IJ SOLN
INTRAMUSCULAR | Status: AC
  Filled 2015-06-06: qty 1

## 2015-06-06 MED ORDER — POTASSIUM CHLORIDE 10 MEQ/100ML IV SOLN
10.0000 meq | INTRAVENOUS | Status: AC
  Administered 2015-06-06: 10 meq via INTRAVENOUS
  Filled 2015-06-06: qty 100

## 2015-06-06 MED ORDER — ROCURONIUM BROMIDE 100 MG/10ML IV SOLN
INTRAVENOUS | Status: DC | PRN
  Administered 2015-06-06: 10 mg via INTRAVENOUS
  Administered 2015-06-06: 35 mg via INTRAVENOUS
  Administered 2015-06-06: 5 mg via INTRAVENOUS

## 2015-06-06 MED ORDER — PROPOFOL 10 MG/ML IV BOLUS
INTRAVENOUS | Status: AC
  Filled 2015-06-06: qty 20

## 2015-06-06 MED ORDER — SODIUM CHLORIDE 0.9 % IR SOLN
Status: DC | PRN
  Administered 2015-06-06: 1000 mL

## 2015-06-06 MED ORDER — MIDAZOLAM HCL 2 MG/2ML IJ SOLN
INTRAMUSCULAR | Status: AC
  Filled 2015-06-06: qty 2

## 2015-06-06 MED ORDER — HYDROMORPHONE HCL 1 MG/ML IJ SOLN
0.2500 mg | INTRAMUSCULAR | Status: DC | PRN
  Administered 2015-06-06: 0.25 mg via INTRAVENOUS
  Administered 2015-06-06 (×2): 0.5 mg via INTRAVENOUS
  Administered 2015-06-06: 0.25 mg via INTRAVENOUS

## 2015-06-06 MED ORDER — ALBUTEROL SULFATE HFA 108 (90 BASE) MCG/ACT IN AERS
INHALATION_SPRAY | RESPIRATORY_TRACT | Status: AC
  Filled 2015-06-06: qty 6.7

## 2015-06-06 MED ORDER — BUPIVACAINE-EPINEPHRINE 0.25% -1:200000 IJ SOLN
INTRAMUSCULAR | Status: DC | PRN
  Administered 2015-06-06: 30 mL

## 2015-06-06 MED ORDER — 0.9 % SODIUM CHLORIDE (POUR BTL) OPTIME
TOPICAL | Status: DC | PRN
  Administered 2015-06-06: 1000 mL

## 2015-06-06 MED ORDER — ROCURONIUM BROMIDE 50 MG/5ML IV SOLN
INTRAVENOUS | Status: AC
  Filled 2015-06-06: qty 1

## 2015-06-06 MED ORDER — BUPIVACAINE-EPINEPHRINE (PF) 0.25% -1:200000 IJ SOLN
INTRAMUSCULAR | Status: AC
  Filled 2015-06-06: qty 30

## 2015-06-06 MED ORDER — PROMETHAZINE HCL 25 MG/ML IJ SOLN: 6.2500 mg | INTRAMUSCULAR | Status: DC | PRN

## 2015-06-06 MED ORDER — ONDANSETRON HCL 4 MG/2ML IJ SOLN
INTRAMUSCULAR | Status: DC | PRN
  Administered 2015-06-06: 4 mg via INTRAVENOUS

## 2015-06-06 MED ORDER — FENTANYL CITRATE (PF) 250 MCG/5ML IJ SOLN
INTRAMUSCULAR | Status: AC
  Filled 2015-06-06: qty 5

## 2015-06-06 MED ORDER — NEOSTIGMINE METHYLSULFATE 10 MG/10ML IV SOLN
INTRAVENOUS | Status: AC
  Filled 2015-06-06: qty 1

## 2015-06-06 MED ORDER — ONDANSETRON HCL 4 MG/2ML IJ SOLN
INTRAMUSCULAR | Status: AC
  Filled 2015-06-06: qty 2

## 2015-06-06 MED ORDER — LIDOCAINE HCL (CARDIAC) 20 MG/ML IV SOLN
INTRAVENOUS | Status: DC | PRN
  Administered 2015-06-06: 60 mg via INTRAVENOUS

## 2015-06-06 MED ORDER — NEOSTIGMINE METHYLSULFATE 10 MG/10ML IV SOLN
INTRAVENOUS | Status: DC | PRN
  Administered 2015-06-06: 4 mg via INTRAVENOUS

## 2015-06-06 MED ORDER — LACTATED RINGERS IV SOLN
INTRAVENOUS | Status: DC | PRN
  Administered 2015-06-06 (×2): via INTRAVENOUS

## 2015-06-06 MED ORDER — LACTATED RINGERS IV SOLN
INTRAVENOUS | Status: DC
Start: 2015-06-06 — End: 2015-06-10
  Administered 2015-06-08: 16:00:00 via INTRAVENOUS

## 2015-06-06 MED ORDER — FENTANYL CITRATE (PF) 100 MCG/2ML IJ SOLN
INTRAMUSCULAR | Status: DC | PRN
  Administered 2015-06-06: 25 ug via INTRAVENOUS
  Administered 2015-06-06: 100 ug via INTRAVENOUS
  Administered 2015-06-06: 25 ug via INTRAVENOUS
  Administered 2015-06-06: 50 ug via INTRAVENOUS

## 2015-06-06 MED ORDER — IOHEXOL 300 MG/ML  SOLN
INTRAMUSCULAR | Status: DC | PRN
  Administered 2015-06-06: 10:00:00

## 2015-06-06 SURGICAL SUPPLY — 50 items
APPLIER CLIP ROT 10 11.4 M/L (STAPLE) ×2
BENZOIN TINCTURE PRP APPL 2/3 (GAUZE/BANDAGES/DRESSINGS) ×2 IMPLANT
BLADE SURG ROTATE 9660 (MISCELLANEOUS) IMPLANT
CANISTER SUCTION 2500CC (MISCELLANEOUS) ×2 IMPLANT
CHLORAPREP W/TINT 26ML (MISCELLANEOUS) ×2 IMPLANT
CLIP APPLIE ROT 10 11.4 M/L (STAPLE) ×1 IMPLANT
COVER MAYO STAND STRL (DRAPES) ×2 IMPLANT
COVER SURGICAL LIGHT HANDLE (MISCELLANEOUS) ×2 IMPLANT
DRAIN CHANNEL 19F RND (DRAIN) ×2 IMPLANT
DRAPE C-ARM 42X72 X-RAY (DRAPES) ×2 IMPLANT
DRSG TEGADERM 2-3/8X2-3/4 SM (GAUZE/BANDAGES/DRESSINGS) ×2 IMPLANT
DRSG TEGADERM 4X4.75 (GAUZE/BANDAGES/DRESSINGS) ×2 IMPLANT
ELECT REM PT RETURN 9FT ADLT (ELECTROSURGICAL) ×2
ELECTRODE REM PT RTRN 9FT ADLT (ELECTROSURGICAL) ×1 IMPLANT
EVACUATOR SILICONE 100CC (DRAIN) ×2 IMPLANT
FILTER SMOKE EVAC LAPAROSHD (FILTER) ×2 IMPLANT
GAUZE SPONGE 2X2 8PLY STRL LF (GAUZE/BANDAGES/DRESSINGS) ×1 IMPLANT
GLOVE BIO SURGEON STRL SZ7 (GLOVE) ×8 IMPLANT
GLOVE BIOGEL PI IND STRL 7.0 (GLOVE) ×4 IMPLANT
GLOVE BIOGEL PI IND STRL 7.5 (GLOVE) ×1 IMPLANT
GLOVE BIOGEL PI IND STRL 8 (GLOVE) ×1 IMPLANT
GLOVE BIOGEL PI INDICATOR 7.0 (GLOVE) ×4
GLOVE BIOGEL PI INDICATOR 7.5 (GLOVE) ×1
GLOVE BIOGEL PI INDICATOR 8 (GLOVE) ×1
GLOVE ECLIPSE 7.5 STRL STRAW (GLOVE) ×2 IMPLANT
GLOVE SURG SS PI 6.5 STRL IVOR (GLOVE) ×2 IMPLANT
GLOVE SURG SS PI 7.0 STRL IVOR (GLOVE) ×2 IMPLANT
GOWN STRL REUS W/ TWL LRG LVL3 (GOWN DISPOSABLE) ×7 IMPLANT
GOWN STRL REUS W/TWL LRG LVL3 (GOWN DISPOSABLE) ×7
KIT BASIN OR (CUSTOM PROCEDURE TRAY) ×2 IMPLANT
KIT ROOM TURNOVER OR (KITS) ×2 IMPLANT
NS IRRIG 1000ML POUR BTL (IV SOLUTION) ×2 IMPLANT
PAD ARMBOARD 7.5X6 YLW CONV (MISCELLANEOUS) ×2 IMPLANT
SCISSORS LAP 5X35 DISP (ENDOMECHANICALS) ×2 IMPLANT
SET CHOLANGIOGRAPH 5 50 .035 (SET/KITS/TRAYS/PACK) ×2 IMPLANT
SET IRRIG TUBING LAPAROSCOPIC (IRRIGATION / IRRIGATOR) ×2 IMPLANT
SLEEVE ENDOPATH XCEL 5M (ENDOMECHANICALS) ×2 IMPLANT
SPECIMEN JAR SMALL (MISCELLANEOUS) ×2 IMPLANT
SPONGE GAUZE 2X2 STER 10/PKG (GAUZE/BANDAGES/DRESSINGS) ×1
SPONGE GAUZE 4X4 12PLY STER LF (GAUZE/BANDAGES/DRESSINGS) ×2 IMPLANT
STRIP CLOSURE SKIN 1/2X4 (GAUZE/BANDAGES/DRESSINGS) ×2 IMPLANT
SUT ETHILON 2 0 FS 18 (SUTURE) ×2 IMPLANT
SUT MNCRL AB 4-0 PS2 18 (SUTURE) ×2 IMPLANT
TOWEL OR 17X24 6PK STRL BLUE (TOWEL DISPOSABLE) ×2 IMPLANT
TOWEL OR 17X26 10 PK STRL BLUE (TOWEL DISPOSABLE) ×2 IMPLANT
TRAY LAPAROSCOPIC MC (CUSTOM PROCEDURE TRAY) ×2 IMPLANT
TROCAR XCEL BLUNT TIP 100MML (ENDOMECHANICALS) ×2 IMPLANT
TROCAR XCEL NON-BLD 11X100MML (ENDOMECHANICALS) ×2 IMPLANT
TROCAR XCEL NON-BLD 5MMX100MML (ENDOMECHANICALS) ×2 IMPLANT
TUBING INSUFFLATION (TUBING) ×2 IMPLANT

## 2015-06-06 NOTE — Progress Notes (Signed)
Day of Surgery  Subjective: Patient still having some mild RUQ tenderness [name] up to 3.4 after many runs of KCl. Planning to proceed with surgery this morning.  Objective: Vital signs in last 24 hours: Temp:  [97.3 F (36.3 C)-98.5 F (36.9 C)] 98.3 F (36.8 C) (07/01 1771) Pulse Rate:  [72-95] 72 (07/01 0632) Resp:  [16-18] 18 (07/01 1657) BP: (112-143)/(58-74) 122/63 mmHg (07/01 0632) SpO2:  [96 %-100 %] 100 % (07/01 9038) Last BM Date: 06/05/15  Intake/Output from previous day: 06/30 0701 - 07/01 0700 In: 360 [P.O.:360] Out: 60 [Drains:60] Intake/Output this shift:    GI: Mild RUQ tenderness; Drain with scant bloody drainage  Lab Results:   Recent Labs  06/04/15 1330 06/05/15 0327  WBC 39.6* 27.6*  HGB 9.9* 9.5*  HCT 28.7* 28.0*  PLT 399 406*   BMET  Recent Labs  06/05/15 1935 06/06/15 0737  NA 132* 131*  [name] 3.0* 3.4*  CL 97* 97*  CO2 25 24  GLUCOSE 113* 97  BUN 14 16  CREATININE 0.68 0.71  CALCIUM 8.6* 8.8*   Lab Results  Component Value Date   ALT 191* 06/06/2015   AST 85* 06/06/2015   ALKPHOS 582* 06/06/2015   BILITOT 1.6* 06/06/2015    PT/INR No results for input(s): LABPROT, INR in the last 72 hours. ABG No results for input(s): PHART, HCO3 in the last 72 hours.  Invalid input(s): PCO2, PO2  Studies/Results: No results found.  Anti-infectives: Anti-infectives    Start     Dose/Rate Route Frequency Ordered Stop   06/03/15 1200  piperacillin-tazobactam (ZOSYN) IVPB 3.375 g     3.375 g 12.5 mL/hr over 240 Minutes Intravenous 3 times per day 06/03/15 0700     06/03/15 0315  piperacillin-tazobactam (ZOSYN) IVPB 3.375 g     3.375 g 100 mL/hr over 30 Minutes Intravenous  Once 06/03/15 0313 06/03/15 0428      Assessment/Plan: s/p Procedure(s): LAPAROSCOPIC CHOLECYSTECTOMY WITH INTRAOPERATIVE CHOLANGIOGRAM (N/A) Cholecystitis s/p percutaneous cholecystostomy complicated by bleeding  Gallbladder and biliary tree obstructed by clot - T.  Bili is decreasing Discussed with patient and family.  The surgical procedure has been discussed with the patient.  Potential risks, benefits, alternative treatments, and expected outcomes have been explained.  All of the patient's questions at this time have been answered.  The likelihood of reaching the patient's treatment goal is good.  The patient understand the proposed surgical procedure and wishes to proceed.   LOS: 3 days    Jennifer [name]. 06/06/2015

## 2015-06-06 NOTE — Progress Notes (Signed)
Subjective: Patient was seen and examined at bedside today. Vitals are stable, no fever. Patient complaining of generalized abdominal tenderness, especially at the site of cholecystotomy tube. Patient continues to be hyponatremic today Na 131, yesterday was 132. K is trending up 3.4, was 3.0 yesterday. LFTs trending down. Going to OR today for cholecystectomy.   Objective: Vital signs in last 24 hours: Filed Vitals:   06/05/15 1415 06/05/15 1907 06/05/15 2159 06/06/15 0632  BP: 112/58  143/74 122/63  Pulse: 85 95 85 72  Temp: 97.3 F (36.3 C)  98.5 F (36.9 C) 98.3 F (36.8 C)  TempSrc: Oral  Oral Oral  Resp: 18 18 16 18   Height:      Weight:      SpO2: 98% 98% 96% 100%   Weight change:   Intake/Output Summary (Last 24 hours) at 06/06/15 1135 Last data filed at 06/06/15 1124  Gross per 24 hour  Intake   1860 ml  Output     60 ml  Net   1800 ml     Physical Exam  General: resting in bed, NAD HEENT: PERRL, EOMI, no scleral icterus Cardiac: RRR, no rubs, murmurs or gallops Pulm: clear to auscultation bilaterally, no wheezes, rales, or rhonchi Abd: cholecystostomy site has some dried blood but no active bleeding, covered by gauge. Cholecystostomy bag has ~30 cc blood in it right now. Has generalized tenderness of the abdomen, less than yesterday. Ext: warm and well perfused, no pedal edema Neuro: alert and oriented X3, cranial nerves II-XII grossly intact  Lab Results: Basic Metabolic Panel:  Recent Labs Lab 06/05/15 1143 06/05/15 1935 06/06/15 0737  NA 132* 132* 131*  K 2.9* 3.0* 3.4*  CL 98* 97* 97*  CO2 25 25 24   GLUCOSE 129* 113* 97  BUN 15 14 16   CREATININE 0.73 0.68 0.71  CALCIUM 8.8* 8.6* 8.8*  MG 2.0  --  2.0   Liver Function Tests:  Recent Labs Lab 06/05/15 1143 06/06/15 0737  AST 233* 85*  ALT 284* 191*  ALKPHOS 655* 582*  BILITOT 3.0* 1.6*  PROT 5.9* 6.2*  ALBUMIN 2.0* 2.0*    Recent Labs Lab 06/02/15 2351 06/04/15 1329  LIPASE 11*  11*   CBC:  Recent Labs Lab 06/02/15 2351  06/04/15 1330 06/05/15 0327  WBC 15.4*  < > 39.6* 27.6*  NEUTROABS 12.2*  --   --   --   HGB 11.0*  < > 9.9* 9.5*  HCT 33.8*  < > 28.7* 28.0*  MCV 99.1  < > 93.8 94.0  PLT 412*  < > 399 406*  < > = values in this interval not displayed.  Urinalysis:  Recent Labs Lab 06/03/15 0027  COLORURINE YELLOW  LABSPEC 1.010  PHURINE 7.0  GLUCOSEU NEGATIVE  HGBUR NEGATIVE  BILIRUBINUR NEGATIVE  KETONESUR NEGATIVE  PROTEINUR NEGATIVE  UROBILINOGEN 1.0  NITRITE NEGATIVE  LEUKOCYTESUR TRACE*    Micro Results: Recent Results (from the past 240 hour(s))  Blood Culture (routine x 2)     Status: None   Collection Time: 05/27/15  5:55 PM  Result Value Ref Range Status   Specimen Description BLOOD RIGHT HAND  Final   Special Requests BOTTLES DRAWN AEROBIC AND ANAEROBIC 5CC  Final   Culture NO GROWTH 5 DAYS  Final   Report Status 06/01/2015 FINAL  Final  Blood Culture (routine x 2)     Status: None   Collection Time: 05/27/15  5:58 PM  Result Value Ref Range Status  Specimen Description BLOOD LEFT HAND  Final   Special Requests BOTTLES DRAWN AEROBIC ONLY 2CC  Final   Culture NO GROWTH 5 DAYS  Final   Report Status 06/01/2015 FINAL  Final  Culture, body fluid-bottle     Status: None (Preliminary result)   Collection Time: 06/03/15  3:36 AM  Result Value Ref Range Status   Specimen Description BILE  Final   Special Requests NONE  Final   Gram Stain   Final    GRAM NEGATIVE RODS IN BOTH AEROBIC AND ANAEROBIC BOTTLES GRAM STAIN REVIEWED-AGREE WITH RESULT MVESTAL GRAM POSITIVE COCCI IN CHAINS ANAEROBIC BOTTLE ONLY    Culture PSEUDOMONAS AERUGINOSA GRAM POSITIVE COCCI   Final   Report Status PENDING  Incomplete   Organism ID, Bacteria PSEUDOMONAS AERUGINOSA  Final      Susceptibility   Pseudomonas aeruginosa - MIC*    CEFTAZIDIME 4 SENSITIVE Sensitive     CIPROFLOXACIN >=4 RESISTANT Resistant     GENTAMICIN <=1 SENSITIVE  Sensitive     IMIPENEM 2 SENSITIVE Sensitive     PIP/TAZO 8 SENSITIVE Sensitive     CEFEPIME 2 SENSITIVE Sensitive     * PSEUDOMONAS AERUGINOSA  Gram stain     Status: None   Collection Time: 06/03/15  3:36 AM  Result Value Ref Range Status   Specimen Description BILE  Final   Special Requests NONE  Final   Gram Stain   Final    FEW WBC PRESENT, PREDOMINANTLY PMN MODERATE GRAM NEGATIVE RODS Gram Stain Report Called to,Read Back By and Verified With: T SPENCER,RN AT 1300 06/03/15 BY K BARR    Report Status 06/03/2015 FINAL  Final   Medications: I have reviewed the patient's current medications. Scheduled Meds: . [MAR Hold] amiodarone  200 mg Oral BID  . [MAR Hold] amitriptyline  50 mg Oral QHS  . [MAR Hold] bisacodyl  10 mg Rectal Daily  . [MAR Hold] mometasone-formoterol  2 puff Inhalation BID  . [MAR Hold] NIFEdipine  90 mg Oral Daily  . [MAR Hold] pantoprazole  40 mg Oral Daily  . [MAR Hold] piperacillin-tazobactam (ZOSYN)  IV  3.375 g Intravenous 3 times per day  . [MAR Hold] potassium chloride  40 mEq Oral Daily  . [MAR Hold] potassium chloride  10 mEq Intravenous Q1 Hr x 4  . [MAR Hold] senna-docusate  1 tablet Oral BID  . [MAR Hold] sertraline  100 mg Oral BID  . [MAR Hold] sodium chloride  3 mL Intravenous Q12H  . [MAR Hold] tiotropium  18 mcg Inhalation Daily  . [MAR Hold] topiramate  50 mg Oral BID   Continuous Infusions: . lactated ringers     PRN Meds:.0.9 % irrigation (POUR BTL), [MAR Hold] albuterol, [MAR Hold] ALPRAZolam, bupivacaine-EPINEPHrine, [MAR Hold]  morphine injection, Omnipaque 300 mg/mL (50 mL) in 0.9% normal saline (50 mL), [MAR Hold] promethazine, sodium chloride irrigation, [MAR Hold] sodium phosphate  Assessment/Plan: Patienr is a 74 yo female with cholecystostomy bag placed for cholecystitis, recent portal vein thrombosis, afib, on anticoag here with cholecystostomy site bleeding.   Cholecystostomy tube bleeding - around the skin with suture  loosening and also internally as seen around the biliary system.  - Patient's K has trended up from 3.0 yesterday to 3.4 today. Patient taken to OR for  cholecystectomy with intraoperative cholangiogram by surgery. Follow up after procedure.  - Bile cx and gram stain growing GNR. Continue zosyn for now. - hold anticoag.  - continue pain management with Morphine 2mg /ml  injection for pain. - trend CBC  Biliary tract infection growing Pseudomonas - culture from bile is growing pseudomonas sensitive to Zosyn (currently on this since admission).  - cont Zosyn  Recent portal vein thrombosis - hold Xarelto for now as she had bleeding at cholecystostomy site, also per IR, not too clear if would need anticoag for this, this may have been a soft call. Will continue Xarelto when patient is stable.   Afib with RVR - chads2vas 3 - likely was initiated by sepsis/acute portal vein thrombosis last admisison - cont amio for now. - hold xarelto as above  Hypokalemia - likely 2/2 to poor PO intake vs fluid losses via biliary tract  - K is being repleted, 3.4 today. Continue to monitor values and replete PRN.  - Monitor Mag level, 2.0 today.   HTN - at home on HCTZ 12.5mg  daily and nifedipine 90mg  daily.  - hold HCTZ b/c of hypokalemia. Cont nifedipine.   CHF - last TTE 04/2015 w/ EF 55-60% with grade 1 diastolic dysfunction  COPD--on advair 1 puff bid, spiriva 18 mch inh daily, and albuterol 1-2 puff q6hprn -cont dulera1 puff bid, spiriva 18 mcg inh daily, and albuterol 1-2 puff q6hprn  Fibromyalgia- at home she takes norco 7.5-325mg  QID prn, amitriptyline 50mg  qhs, carisoprodol 350mg  qd prn - pain management as above  OSA- cont CPAP qhs  Anxiety and depression-- cont home zoloft 100mg  BID, elavil 50mg  qhs, and xanax 0.5mg  TID prn  Migraines- cont home topamax 50mg  BID  Pre-diabetes -hgba1c 6.0 - lifestyle mod.  Diet: clear liquid for now.  Code: full.  Dispo: Disposition is deferred at  this time, awaiting improvement of current medical problems.  Anticipated discharge in approximately 2-3 day(s).   The patient does have a current PCP Bertha Stakes, MD) and does need an [facility] hospital follow-up appointment after discharge.  The patient does not have transportation limitations that hinder transportation to clinic appointments.  .Services Needed at time of discharge: Y = Yes, Blank = No PT:   OT:   RN:   Equipment:   Other:     LOS: 3 days   Shela Leff, MD 06/06/2015, 11:35 AM

## 2015-06-06 NOTE — Clinical Social Work Note (Signed)
Clinical Social Work Assessment  Patient Details  Name: BRIANY [name] MRN: [id number] Date of Birth: Jan 08, 1941  Date of referral: 06/05/15   Reason for consult: Facility Placement     Housing/Transportation Living arrangements for the past 2 months: [place] of Information: Patient Patient Interpreter Needed: None Criminal Activity/Legal Involvement Pertinent to Current Situation/Hospitalization: No - Comment as needed Significant Relationships: Adult Children Lives with: Self Do you feel safe going back to the place where you live? Yes Need for family participation in patient care: No (Coment)  Care giving concerns: Patient is at [facility] and is happy with care being provided.   Social Worker assessment / plan: CSW met the pt at bedside. CSW introduced self and purpose of the visit. CSW discussed SNF rehab. Pt reported that she was receiving rehab prior to her admission. Pt reported that she would like to return to [facility]. Pt reported that she did not have any questions at this time. CSW will continue to follow this pt and assist with discharge as needed.   Employment status: Retired Engineer, manufacturing systems PT Recommendations: Not assessed at this time Information / Referral to community resources:  (N/A)  Patient/Family's Response to care: Patient and family appreciative of care being provided  Patient/Family's Understanding of and Emotional Response to Diagnosis, Current Treatment, and Prognosis: Pt expressed feelings of fear. Pt reported not fully understanding what was occuring with her body. Pt reported feeling comfort about coming to the hospital, so the doctor can aid in resolving her problem.   Emotional Assessment Appearance: Appears stated age Attitude/Demeanor/Rapport:  (Sense of Humor) Affect (typically observed): Appropriate,  Pleasant Orientation: Oriented to Situation, Oriented to Time, Oriented to Place, Oriented to Self Alcohol / Substance use: Not Applicable Psych involvement (Current and /or in the community): No (Comment)  Discharge Needs  Concerns to be addressed: Denies Needs/Concerns at this time Readmission within the last 30 days: No Current discharge risk: None Barriers to Discharge: No Barriers Identified  Jones Broom. [place], MSW, [place] 06/06/2015 1:33 PM

## 2015-06-06 NOTE — Anesthesia Postprocedure Evaluation (Signed)
  Anesthesia Post-op Note  Patient: Jennifer [name]  Procedure(s) Performed: Procedure(s): LAPAROSCOPIC CHOLECYSTECTOMY WITH INTRAOPERATIVE CHOLANGIOGRAM (N/A)  Patient Location: PACU  Anesthesia Type:General  Level of Consciousness: awake  Airway and Oxygen Therapy: Patient Spontanous Breathing  Post-op Pain: mild  Post-op Assessment: Post-op Vital signs reviewed              Post-op Vital Signs: Reviewed  Last Vitals:  Filed Vitals:   06/06/15 1300  BP:   Pulse: 84  Temp:   Resp: 19    Complications: No apparent anesthesia complications

## 2015-06-06 NOTE — Op Note (Addendum)
Laparoscopic Cholecystectomy with IOC Procedure Note  Indications: This patient presents with symptomatic gallbladder disease and will undergo laparoscopic cholecystectomy.  She previously had a percutaneous cholecystostomy placed for acute cholecystitis.  Unfortunately, she was anticoagulated with Xarelto and had blood from her drain as well as blood in her biliary tree.  This has caused some obstruction of the common bile duct with Bilirubin up to 5.  She presents now for cholecystectomy and drain removal.  Pre-operative Diagnosis: Calculus of gallbladder with other cholecystitis, common duct obstruction from blood clot; hemobilia  Post-operative Diagnosis: Same  Surgeon: Maia Petties.   Assistants: Dr. Doreen Salvage  Anesthesia: General endotracheal anesthesia  ASA Class: 3  Procedure Details  The patient was seen again in the Holding Room. The risks, benefits, complications, treatment options, and expected outcomes were discussed with the patient. The possibilities of reaction to medication, pulmonary aspiration, perforation of viscus, bleeding, recurrent infection, finding a normal gallbladder, the need for additional procedures, failure to diagnose a condition, the possible need to convert to an open procedure, and creating a complication requiring transfusion or operation were discussed with the patient. The likelihood of improving the patient's symptoms with return to their baseline status is good.  The patient and/or family concurred with the proposed plan, giving informed consent. The site of surgery properly noted. The patient was taken to Operating Room, identified as Jennifer [name] and the procedure verified as Laparoscopic Cholecystectomy with Intraoperative Cholangiogram. A Time Out was held and the above information confirmed.  Prior to the induction of general anesthesia, antibiotic prophylaxis was administered. General endotracheal anesthesia was then administered and tolerated  well. After the induction, the abdomen was prepped with Chloraprep, including the cholecystostomy drain and draped in the sterile fashion. The patient was positioned in the supine position.  Local anesthetic agent was injected into the skin near the umbilicus and an incision made. We dissected down to the abdominal fascia with blunt dissection.  The fascia was incised vertically and we entered the peritoneal cavity bluntly.  A pursestring suture of 0-Vicryl was placed around the fascial opening.  The Hasson cannula was inserted and secured with the stay suture.  Pneumoperitoneum was then created with CO2 and tolerated well without any adverse changes in the patient's vital signs. An 11-mm port was placed in the subxiphoid position.  Two 5-mm ports were placed in the right upper quadrant. All skin incisions were infiltrated with a local anesthetic agent before making the incision and placing the trocars.   We positioned the patient in reverse Trendelenburg, tilted slightly to the patient's left.  The omentum is densely adherent to the abdominal wall and the liver in the right upper quadrant.  We were able to bluntly peel the omentum away from the abdominal wall and the edge of the liver.  The gallbladder was slowly exposed.  The gallbladder was identified, the fundus grasped and retracted cephalad. Adhesions were lysed bluntly and with the electrocautery where indicated, taking care not to injure any adjacent organs or viscus. The infundibulum was grasped and retracted laterally, exposing the peritoneum overlying the triangle of Calot. This was then divided and exposed in a blunt fashion. A critical view of the cystic duct and cystic artery was obtained.  The cystic duct was clearly identified and bluntly dissected circumferentially. The cystic duct was ligated with a clip distally.   An incision was made in the cystic duct and some blood clot was milked out of the cystic duct.  The [facility] cholangiogram  catheter  was introduced.  The catheter was secured using a clip. A cholangiogram was then obtained which showed contrast flowing down the cystic duct into the common bile duct.  However, contrast did not flow very far into the CBD and refluxed back, causing leak around the clip.  The common bile duct is likely filled with clot.  We removed the catheter. The cystic duct was then ligated with clips and divided. The cystic artery was identified, dissected free, ligated with clips and divided as well.   The gallbladder was dissected from the liver bed in retrograde fashion with the electrocautery. The gallbladder was removed and placed in an Endocatch sac. The liver bed was irrigated and inspected. Hemostasis was achieved with the electrocautery. Copious irrigation was utilized and was repeatedly aspirated until clear.  A 19 Fr drain was placed into the gallbladder fossa and brought out the R lateral port site.  This was secured with 2-0 Ethilon.  The gallbladder and Endocatch sac were then removed through the umbilical port site.  The pursestring suture was used to close the umbilical fascia.    We again inspected the right upper quadrant for hemostasis.  Pneumoperitoneum was released as we removed the trocars.  4-0 Monocryl was used to close the skin.   Benzoin, steri-strips, and clean dressings were applied. The patient was then extubated and brought to the recovery room in stable condition. Instrument, sponge, and needle counts were correct at closure and at the conclusion of the case.   Findings: Cholecystitis with Cholelithiasis  Estimated Blood Loss: less than 100 mL         Drains: JP drain in RUQ         Specimens: Gallbladder           Complications: None; patient tolerated the procedure well.         Disposition: PACU - hemodynamically stable.         Condition: stable  Imogene Burn. Georgette Dover, MD, [facility] Surgery  General/ Trauma Surgery  06/06/2015 11:55 AM

## 2015-06-06 NOTE — Transfer of Care (Signed)
Immediate Anesthesia Transfer of Care Note  Patient: Jennifer [name]  Procedure(s) Performed: Procedure(s): LAPAROSCOPIC CHOLECYSTECTOMY WITH INTRAOPERATIVE CHOLANGIOGRAM (N/A)  Patient Location: PACU  Anesthesia Type:General  Level of Consciousness: awake, oriented and patient cooperative  Airway & Oxygen Therapy: Patient Spontanous Breathing and Patient connected to face mask oxygen  Post-op Assessment: Report given to RN and Post -op Vital signs reviewed and stable  Post vital signs: Reviewed  Last Vitals:  Filed Vitals:   06/06/15 0632  BP: 122/63  Pulse: 72  Temp: 36.8 C  Resp: 18    Complications: No apparent anesthesia complications

## 2015-06-06 NOTE — Progress Notes (Signed)
Patient seen and examined. Case d/w residents in detail. I agree withf indings and plan as documented in Dr. Elon Jester note  Patient is now /p cholecystectomy. She complains of pain at surgical site. No fevers. Lungs are CTA b/l, Drain in place at cholecystectomy site and dressing intact, BS +  Assessment and Plan:  Patient is here with bleeding from her cholecystostomy site and has a history of recent cholecystitis and portal vein thrombosis. Her LFTs are slowly normalizing and she is now s/p cholecystectomy. Will c/w zosyn for now to cover for pseudomonas cultured from biliary fluid.  Hold xarelto for now. Will resume once ok with surgery. Will monitor Hg closely.

## 2015-06-06 NOTE — Progress Notes (Signed)
Pt states she does not need help putting on her CPAP and will do so later tonight. Will call RT if she needs help.

## 2015-06-06 NOTE — Anesthesia Preprocedure Evaluation (Signed)
Anesthesia Evaluation  Patient identified by MRN, date of birth, ID band Patient awake    Reviewed: Allergy & Precautions, NPO status   Airway Mallampati: II  TM Distance: >3 FB Neck ROM: Full    Dental   Pulmonary shortness of breath, sleep apnea , pneumonia -, COPDformer smoker,  breath sounds clear to auscultation        Cardiovascular hypertension, + Peripheral Vascular Disease Rhythm:Regular Rate:Normal     Neuro/Psych  Headaches, Anxiety Depression    GI/Hepatic Neg liver ROS, GERD-  ,  Endo/Other  negative endocrine ROS  Renal/GU negative Renal ROS     Musculoskeletal  (+) Arthritis -, Fibromyalgia -  Abdominal   Peds  Hematology   Anesthesia Other Findings   Reproductive/Obstetrics                             Anesthesia Physical Anesthesia Plan  ASA: III  Anesthesia Plan: General   Post-op Pain Management:    Induction: Intravenous  Airway Management Planned: Oral ETT  Additional Equipment:   Intra-op Plan:   Post-operative Plan: Extubation in OR  Informed Consent: I have reviewed the patients History and Physical, chart, labs and discussed the procedure including the risks, benefits and alternatives for the proposed anesthesia with the patient or authorized representative who has indicated his/her understanding and acceptance.     Plan Discussed with: CRNA and Anesthesiologist  Anesthesia Plan Comments:         Anesthesia Quick Evaluation

## 2015-06-06 NOTE — Anesthesia Procedure Notes (Signed)
Procedure Name: Intubation Date/Time: 06/06/2015 10:16 AM Performed by: Jenne Campus Pre-anesthesia Checklist: Patient identified, Emergency Drugs available, Suction available, Patient being monitored and Timeout performed Patient Re-evaluated:Patient Re-evaluated prior to inductionOxygen Delivery Method: Circle system utilized Preoxygenation: Pre-oxygenation with 100% oxygen Intubation Type: IV induction Ventilation: Mask ventilation without difficulty and Oral airway inserted - appropriate to patient size Laryngoscope Size: Miller and 2 Grade View: Grade I Tube type: Oral Tube size: 7.0 mm Number of attempts: 1 Airway Equipment and Method: Stylet Placement Confirmation: ETT inserted through vocal cords under direct vision,  positive ETCO2,  CO2 detector and breath sounds checked- equal and bilateral Secured at: 21 cm Tube secured with: Tape Dental Injury: Teeth and Oropharynx as per pre-operative assessment

## 2015-06-06 NOTE — Clinical Social Work Note (Signed)
CSW received phone call from [place] at [facility] requesting clinicals to be sent to SNF to try to begin insurance approval.  CSW contacted bedside nurse to see if she can get PT orders from the physician so PT can eval patient as soon as possible.  CSW to continue to follow patient throughout discharge planning.  Jones Broom. [place], MSW, [place]

## 2015-06-07 LAB — CBC
HEMATOCRIT: 26.2 % — AB (ref 36.0–46.0)
Hemoglobin: 8.6 g/dL — ABNORMAL LOW (ref 12.0–15.0)
MCH: 31.6 pg (ref 26.0–34.0)
MCHC: 32.8 g/dL (ref 30.0–36.0)
MCV: 96.3 fL (ref 78.0–100.0)
PLATELETS: 403 10*3/uL — AB (ref 150–400)
RBC: 2.72 MIL/uL — AB (ref 3.87–5.11)
RDW: 14.2 % (ref 11.5–15.5)
WBC: 15.3 10*3/uL — AB (ref 4.0–10.5)

## 2015-06-07 LAB — CULTURE, BODY FLUID W GRAM STAIN -BOTTLE

## 2015-06-07 LAB — COMPREHENSIVE METABOLIC PANEL
ALK PHOS: 449 U/L — AB (ref 38–126)
ALT: 125 U/L — ABNORMAL HIGH (ref 14–54)
AST: 54 U/L — AB (ref 15–41)
Albumin: 1.9 g/dL — ABNORMAL LOW (ref 3.5–5.0)
Anion gap: 9 (ref 5–15)
BUN: 11 mg/dL (ref 6–20)
CHLORIDE: 98 mmol/L — AB (ref 101–111)
CO2: 25 mmol/L (ref 22–32)
CREATININE: 0.62 mg/dL (ref 0.44–1.00)
Calcium: 8.3 mg/dL — ABNORMAL LOW (ref 8.9–10.3)
GFR calc Af Amer: >60 mL/min (ref >60)
GFR calc non Af Amer: >60 mL/min (ref >60)
GLUCOSE: 90 mg/dL (ref 65–99)
Potassium: 3.4 mmol/L — ABNORMAL LOW (ref 3.5–5.1)
Sodium: 132 mmol/L — ABNORMAL LOW (ref 135–145)
Total Bilirubin: 1.1 mg/dL (ref 0.3–1.2)
Total Protein: 5.7 g/dL — ABNORMAL LOW (ref 6.5–8.1)

## 2015-06-07 LAB — CULTURE, BODY FLUID-BOTTLE

## 2015-06-07 MED ORDER — VANCOMYCIN HCL IN DEXTROSE 1-5 GM/200ML-% IV SOLN
1000.0000 mg | Freq: Two times a day (BID) | INTRAVENOUS | Status: DC
  Administered 2015-06-07 – 2015-06-10 (×6): 1000 mg via INTRAVENOUS
  Filled 2015-06-07 (×7): qty 200

## 2015-06-07 MED ORDER — METHOCARBAMOL 500 MG PO TABS: 1000.0000 mg | ORAL_TABLET | Freq: Three times a day (TID) | ORAL | Status: DC | PRN

## 2015-06-07 MED ORDER — POTASSIUM CHLORIDE 10 MEQ/100ML IV SOLN
10.0000 meq | INTRAVENOUS | Status: AC
  Administered 2015-06-07 (×4): 10 meq via INTRAVENOUS
  Filled 2015-06-07 (×4): qty 100

## 2015-06-07 MED ORDER — OXYCODONE HCL 5 MG PO TABS
5.0000 mg | ORAL_TABLET | ORAL | Status: DC | PRN
  Administered 2015-06-07 – 2015-06-10 (×7): 5 mg via ORAL
  Filled 2015-06-07 (×8): qty 1

## 2015-06-07 MED ORDER — ACETAMINOPHEN 325 MG PO TABS
650.0000 mg | ORAL_TABLET | Freq: Four times a day (QID) | ORAL | Status: DC
  Administered 2015-06-07 – 2015-06-10 (×12): 650 mg via ORAL
  Filled 2015-06-07 (×12): qty 2

## 2015-06-07 MED ORDER — MECLIZINE HCL 12.5 MG PO TABS
12.5000 mg | ORAL_TABLET | Freq: Two times a day (BID) | ORAL | Status: DC | PRN
Start: 2015-06-07 — End: 2015-06-10
  Administered 2015-06-07 – 2015-06-08 (×2): 12.5 mg via ORAL
  Filled 2015-06-07 (×3): qty 1

## 2015-06-07 NOTE — Progress Notes (Signed)
[facility] Progress Note  1 Day Post-Op  Subjective: Pt doing okay, but has a lot of tenderness, no N/V, tolerating some clears, but doesn't like them much.  Not been OOB yet.  Drain with sanguinous drainage.  No flatus or BM yet but has good BS.  Objective: Vital signs in last 24 hours: Temp:  [97.3 F (36.3 C)-99.2 F (37.3 C)] 99.2 F (37.3 C) (07/02 0500) Pulse Rate:  [77-87] 77 (07/02 0500) Resp:  [15-25] 18 (07/02 0500) BP: (116-149)/(62-92) 130/65 mmHg (07/02 0500) SpO2:  [94 %-100 %] 97 % (07/02 0500) Last BM Date: 06/05/15  Intake/Output from previous day: 07/01 0701 - 07/02 0700 In: 1809 [P.O.:356; I.V.:1453] Out: 45 [Drains:45] Intake/Output this shift: Total I/O In: 440 [P.O.:440] Out: -   PE: Gen:  Alert, NAD, pleasant Card:  RRR, no M/G/R heard Pulm:  CTA, no W/R/R Abd: Soft, some distension, tender to palpation, Excellent BS, no HSM, incisions C/D/I, drain with sanguinous drainage   Lab Results:   Recent Labs  06/05/15 0327 06/07/15 0338  WBC 27.6* 15.3*  HGB 9.5* 8.6*  HCT 28.0* 26.2*  PLT 406* 403*   BMET  Recent Labs  06/06/15 0737 06/07/15 0338  NA 131* 132*  K 3.4* 3.4*  CL 97* 98*  CO2 24 25  GLUCOSE 97 90  BUN 16 11  CREATININE 0.71 0.62  CALCIUM 8.8* 8.3*   PT/INR No results for input(s): LABPROT, INR in the last 72 hours. CMP     Component Value Date/Time   NA 132* 06/07/2015 0338   K 3.4* 06/07/2015 0338   CL 98* 06/07/2015 0338   CO2 25 06/07/2015 0338   GLUCOSE 90 06/07/2015 0338   BUN 11 06/07/2015 0338   CREATININE 0.62 06/07/2015 0338   CREATININE 0.61 12/12/2014 1228   CALCIUM 8.3* 06/07/2015 0338   PROT 5.7* 06/07/2015 0338   ALBUMIN 1.9* 06/07/2015 0338   AST 54* 06/07/2015 0338   ALT 125* 06/07/2015 0338   ALKPHOS 449* 06/07/2015 0338   BILITOT 1.1 06/07/2015 0338   GFRNONAA >60 06/07/2015 0338   GFRNONAA 89 12/12/2014 1228   GFRAA >60 06/07/2015 0338   GFRAA >89 12/12/2014 1228    Lipase     Component Value Date/Time   LIPASE 11* 06/04/2015 1329       Studies/Results: Dg Cholangiogram Operative  06/06/2015   CLINICAL DATA:  Intraoperative cholangiogram for cholecystitis  EXAM: INTRAOPERATIVE CHOLANGIOGRAM  FLUOROSCOPY TIME:  9 seconds  COMPARISON:  Ultrasound fluoroscopic guided cholecystostomy tube placement - 04/26/2015; cholangiogram via existing cholecystostomy tube -06/03/2015; 05/25/2015; CT abdomen pelvis -06/02/2025  FINDINGS: Surgical clips overlie the expected location of the neck of the gallbladder.  A cholecystostomy tube overlies expected location of the gallbladder fossa.  Contrast injection fails to opacify any identifiable portion of the biliary system extravasation of contrast about the gallbladder fossa.  IMPRESSION: Nondiagnostic cholangiogram as above. Further evaluation with ERCP could be performed as clinically indicated   Electronically Signed   By: Sandi Mariscal M.D.   On: 06/06/2015 12:01    Anti-infectives: Anti-infectives    Start     Dose/Rate Route Frequency Ordered Stop   06/03/15 1200  piperacillin-tazobactam (ZOSYN) IVPB 3.375 g     3.375 g 12.5 mL/hr over 240 Minutes Intravenous 3 times per day 06/03/15 0700     06/03/15 0315  piperacillin-tazobactam (ZOSYN) IVPB 3.375 g     3.375 g 100 mL/hr over 30 Minutes Intravenous  Once 06/03/15 0313 06/03/15 0428  Assessment/Plan H/o Acute cholecystitis with calculus - s/p perc chole tube 04/25/15, POD #1 s/p lap chole with IOC 06/06/15 CBD obstruction from blood clot Hemobilia Transaminitis  Elevated Alk Phos  Leukocytosis  Elevated bilirubin  ?Left hepatic vein/PV thrombosis AFIB on Xarelto -Cont to hold Xarelto -IVF, pain control, antiemetics -Advance to full liquids -Work towrds oral pain meds -Ambulate and IS -SCD's and hold on lovenox for now given blood loss Hgb 8.6 -Continue to recheck labs -Keep here today, possibly d/c tomororow if labs normalized meeting all  d/c goals      LOS: 4 days    Nat Christen 06/07/2015, 9:42 AM Pager: [phone]

## 2015-06-07 NOTE — Progress Notes (Signed)
OT Cancellation Note  Patient Details Name: [name] MRN: [id number] DOB: 1941-09-11   Cancelled Treatment:    Reason Eval/Treat Not Completed: Other (comment) (OT screened). Pt's current D/C plan is SNF according to note (pt from SNF). No apparent immediate acute care OT needs, therefore will defer OT to SNF. If OT eval is needed please call Acute Rehab Dept. at [phone] or text page OT at [phone].    Benito Mccreedy OTR/L [phone] 06/07/2015, 3:39 PM

## 2015-06-07 NOTE — Progress Notes (Signed)
Subjective: Doing well today, having some abdominal pain, otherwise, no vomiting, or other complaints.  LFT's improved.   Objective: Vital signs in last 24 hours: Filed Vitals:   06/06/15 1925 06/06/15 2047 06/07/15 0500 06/07/15 0954  BP:  149/77 130/65   Pulse:  84 77   Temp:  99.1 F (37.3 C) 99.2 F (37.3 C)   TempSrc:  Oral Oral   Resp:  18 18   Height:      Weight:      SpO2: 97% 97% 97% 96%   Weight change:   Intake/Output Summary (Last 24 hours) at 06/07/15 1311 Last data filed at 06/07/15 1258  Gross per 24 hour  Intake   1149 ml  Output     65 ml  Net   1084 ml   Physical Exam: General: White female, alert, cooperative, NAD. HEENT: PERRL, EOMI. Moist mucus membranes Neck: Full range of motion without pain, supple, no lymphadenopathy or carotid bruits Lungs: Clear to ascultation bilaterally, normal work of respiration, no wheezes, rales, rhonchi Heart: RRR, no murmurs, gallops, or rubs Abdomen: Soft, diffusely tender, non-distended, BS +. Drain in RUQ. Serosanguinous discharge.  Extremities: No cyanosis, clubbing, or edema Neurologic: Alert & oriented X3, cranial nerves II-XII intact, strength grossly intact, sensation intact to light touch   Lab Results: Basic Metabolic Panel:  Recent Labs Lab 06/05/15 1143  06/06/15 0737 06/07/15 0338  NA 132*  < > 131* 132*  K 2.9*  < > 3.4* 3.4*  CL 98*  < > 97* 98*  CO2 25  < > 24 25  GLUCOSE 129*  < > 97 90  BUN 15  < > 16 11  CREATININE 0.73  < > 0.71 0.62  CALCIUM 8.8*  < > 8.8* 8.3*  MG 2.0  --  2.0  --   < > = values in this interval not displayed.   Liver Function Tests:  Recent Labs Lab 06/06/15 0737 06/07/15 0338  AST 85* 54*  ALT 191* 125*  ALKPHOS 582* 449*  BILITOT 1.6* 1.1  PROT 6.2* 5.7*  ALBUMIN 2.0* 1.9*    Recent Labs Lab 06/02/15 2351 06/04/15 1329  LIPASE 11* 11*   CBC:  Recent Labs Lab 06/02/15 2351  06/05/15 0327 06/07/15 0338  WBC 15.4*  < > 27.6* 15.3*    NEUTROABS 12.2*  --   --   --   HGB 11.0*  < > 9.5* 8.6*  HCT 33.8*  < > 28.0* 26.2*  MCV 99.1  < > 94.0 96.3  PLT 412*  < > 406* 403*  < > = values in this interval not displayed.  Urinalysis:  Recent Labs Lab 06/03/15 0027  COLORURINE YELLOW  LABSPEC 1.010  PHURINE 7.0  GLUCOSEU NEGATIVE  HGBUR NEGATIVE  BILIRUBINUR NEGATIVE  KETONESUR NEGATIVE  PROTEINUR NEGATIVE  UROBILINOGEN 1.0  NITRITE NEGATIVE  LEUKOCYTESUR TRACE*    Micro Results: Recent Results (from the past 240 hour(s))  Culture, body fluid-bottle     Status: None (Preliminary result)   Collection Time: 06/03/15  3:36 AM  Result Value Ref Range Status   Specimen Description BILE  Final   Special Requests NONE  Final   Gram Stain   Final    GRAM NEGATIVE RODS IN BOTH AEROBIC AND ANAEROBIC BOTTLES GRAM STAIN REVIEWED-AGREE WITH RESULT MVESTAL GRAM POSITIVE COCCI IN CHAINS ANAEROBIC BOTTLE ONLY    Culture PSEUDOMONAS AERUGINOSA ENTEROCOCCUS SPECIES   Final   Report Status PENDING  Incomplete  Organism ID, Bacteria PSEUDOMONAS AERUGINOSA  Final   Organism ID, Bacteria ENTEROCOCCUS SPECIES  Final      Susceptibility   Pseudomonas aeruginosa - MIC*    CEFTAZIDIME 4 SENSITIVE Sensitive     CIPROFLOXACIN >=4 RESISTANT Resistant     GENTAMICIN <=1 SENSITIVE Sensitive     IMIPENEM 2 SENSITIVE Sensitive     PIP/TAZO 8 SENSITIVE Sensitive     CEFEPIME 2 SENSITIVE Sensitive     * PSEUDOMONAS AERUGINOSA   Enterococcus species - MIC*    AMPICILLIN >=32 RESISTANT Resistant     VANCOMYCIN <=0.5 SENSITIVE Sensitive     GENTAMICIN SYNERGY SENSITIVE Sensitive     * ENTEROCOCCUS SPECIES  Gram stain     Status: None   Collection Time: 06/03/15  3:36 AM  Result Value Ref Range Status   Specimen Description BILE  Final   Special Requests NONE  Final   Gram Stain   Final    FEW WBC PRESENT, PREDOMINANTLY PMN MODERATE GRAM NEGATIVE RODS Gram Stain Report Called to,Read Back By and Verified With: T SPENCER,RN AT  1300 06/03/15 BY K BARR    Report Status 06/03/2015 FINAL  Final   Studies/Results: Dg Cholangiogram Operative  06/06/2015   CLINICAL DATA:  Intraoperative cholangiogram for cholecystitis  EXAM: INTRAOPERATIVE CHOLANGIOGRAM  FLUOROSCOPY TIME:  9 seconds  COMPARISON:  Ultrasound fluoroscopic guided cholecystostomy tube placement - 04/26/2015; cholangiogram via existing cholecystostomy tube -06/03/2015; 05/25/2015; CT abdomen pelvis -06/02/2025  FINDINGS: Surgical clips overlie the expected location of the neck of the gallbladder.  A cholecystostomy tube overlies expected location of the gallbladder fossa.  Contrast injection fails to opacify any identifiable portion of the biliary system extravasation of contrast about the gallbladder fossa.  IMPRESSION: Nondiagnostic cholangiogram as above. Further evaluation with ERCP could be performed as clinically indicated   Electronically Signed   By: Sandi Mariscal M.D.   On: 06/06/2015 12:01   Medications: I have reviewed the patient's current medications. Scheduled Meds: . acetaminophen  650 mg Oral QID  . amiodarone  200 mg Oral BID  . amitriptyline  50 mg Oral QHS  . bisacodyl  10 mg Rectal Daily  . mometasone-formoterol  2 puff Inhalation BID  . NIFEdipine  90 mg Oral Daily  . pantoprazole  40 mg Oral Daily  . piperacillin-tazobactam (ZOSYN)  IV  3.375 g Intravenous 3 times per day  . potassium chloride  40 mEq Oral Daily  . senna-docusate  1 tablet Oral BID  . sertraline  100 mg Oral BID  . sodium chloride  3 mL Intravenous Q12H  . tiotropium  18 mcg Inhalation Daily  . topiramate  50 mg Oral BID   Continuous Infusions: . lactated ringers     PRN Meds:.albuterol, ALPRAZolam, methocarbamol, morphine injection, oxyCODONE, promethazine, sodium phosphate   Assessment/Plan: 74 y/o F w/ PMHx of Anemia, GERD, PAF, depression, anxiety, and recent cholecystitis w/ cholecystostomy, admitted for continued bleeding and possible portal vein thrombosis w/  worsening LFT's and biliary pseudomonas infection. Now s/p cholecystectomy.   Cholecystitis s/p Cholecystectomy: Post-op day 1. Doing well, drain in place, LFT's, bilirubin, leukocytosis improving. Still w/ abdominal pain, however, seems somewhat better today. Hb 8.6 this AM, decreased from 9.5 previously. Appreciate management per surgery.  -Continue Zosyn for now -Continue to hold anticoagulation at this time. Will restart when post-op status is improved.  -Pain control; Oxy IR 5 mg q4h prn -Phenergan prn -Incentive spirometry -PT/OT consulted  Hypokalemia: 3.4 today, same as yesterday. Given 10  mEq x4 this AM (after lab). Unclear etiology, possibly losing from biliary source previously? Magnesium most recently 2.0.  -Recheck BMP in AM -Continue K-dur 40 mEq daily; will continue to reevaluate the need for daily K   Atrial Fibrillation: Previously on Xarelto, discontinued in the setting of upcoming surgery and cholecystostomy bleeding. On Amiodarone, in NSR.  -Continue Amiodarone -Hold anti-coagulation for now; will discuss w/ surgery   HTN: BP stable. On HCTZ 12.5 mg daily and nifedipine 90 mg daily at home. -Cont nifedipine -Hold HCTZ in the setting of hypoK  COPD: Stable. -Continue Dulera 1 puff bid, Spiriva 18 mcg daily, and Albuterol 1-2 puff q6h prn  Fibromyalgia: Takes Norco 7.5-325 mg q6h prn, Amitriptyline 50 mg qhs, Soma 350 mg qd prn at home. -Oxy IR, Robaxin prn  OSA: Stable. -Continue CPAP qhs  Anxiety/Depression: Stable.  -Continue Zoloft 100 mg bid, Xanax 0.5 mg tid prn  Migraines: Stable.  -Continue Topamax 50 mg bid  Dispo: Disposition is deferred at this time, awaiting improvement of current medical problems.  Anticipated discharge in approximately 1-2 day(s).   The patient does have a current PCP Bertha Stakes, MD) and does need an [facility] hospital follow-up appointment after discharge.  The patient does not have transportation limitations that hinder  transportation to clinic appointments.  .Services Needed at time of discharge: Y = Yes, Blank = No PT:   OT:   RN:   Equipment:   Other:     LOS: 4 days   Corky Sox, MD 06/07/2015, 1:11 PM

## 2015-06-07 NOTE — Progress Notes (Signed)
ANTIBIOTIC CONSULT NOTE  Pharmacy Consult for vancomycin, zosyn Indication: biliary pseudomonas and enterococcus infection  Allergies  Allergen Reactions  . Tetracycline Other (See Comments)    REACTION: stomach upset  . Flonase [Fluticasone] Cough  . Lisinopril Cough    Patient Measurements: Height: 5\' 3"  (160 cm) Weight: 235 lb 10.8 oz (106.9 kg) IBW/kg (Calculated) : 52.4   Vital Signs: Temp: 98.6 F (37 C) (07/02 1347) Temp Source: Oral (07/02 1347) BP: 120/56 mmHg (07/02 1347) Pulse Rate: 90 (07/02 1347) Intake/Output from previous day: 07/01 0701 - 07/02 0700 In: 1809 [P.O.:356; I.V.:1453] Out: 45 [Drains:45] Intake/Output from this shift: Total I/O In: 880 [P.O.:880] Out: 20 [Drains:20]  Labs:  Recent Labs  06/05/15 0327  06/05/15 1935 06/06/15 0737 06/07/15 0338  WBC 27.6*  --   --   --  15.3*  HGB 9.5*  --   --   --  8.6*  PLT 406*  --   --   --  403*  CREATININE 0.62  < > 0.68 0.71 0.62  < > = values in this interval not displayed. Estimated Creatinine Clearance: 70.1 mL/min (by C-G formula based on Cr of 0.62). No results for input(s): VANCOTROUGH, VANCOPEAK, VANCORANDOM, GENTTROUGH, GENTPEAK, GENTRANDOM, TOBRATROUGH, TOBRAPEAK, TOBRARND, AMIKACINPEAK, AMIKACINTROU, AMIKACIN in the last 72 hours.   Microbiology: Recent Results (from the past 720 hour(s))  MRSA PCR Screening     Status: Abnormal   Collection Time: 05/27/15 12:59 AM  Result Value Ref Range Status   MRSA by PCR POSITIVE (A) NEGATIVE Final    Comment:        The GeneXpert MRSA Assay (FDA approved for NASAL specimens only), is one component of a comprehensive MRSA colonization surveillance program. It is not intended to diagnose MRSA infection nor to guide or monitor treatment for MRSA infections. RESULT CALLED TO, READ BACK BY AND VERIFIED WITH: W.WORK RN [phone] 05/28/15 E.GADDY   Blood Culture (routine x 2)     Status: None   Collection Time: 05/27/15  5:55 PM  Result Value  Ref Range Status   Specimen Description BLOOD RIGHT HAND  Final   Special Requests BOTTLES DRAWN AEROBIC AND ANAEROBIC 5CC  Final   Culture NO GROWTH 5 DAYS  Final   Report Status 06/01/2015 FINAL  Final  Blood Culture (routine x 2)     Status: None   Collection Time: 05/27/15  5:58 PM  Result Value Ref Range Status   Specimen Description BLOOD LEFT HAND  Final   Special Requests BOTTLES DRAWN AEROBIC ONLY 2CC  Final   Culture NO GROWTH 5 DAYS  Final   Report Status 06/01/2015 FINAL  Final  Culture, body fluid-bottle     Status: None (Preliminary result)   Collection Time: 06/03/15  3:36 AM  Result Value Ref Range Status   Specimen Description BILE  Final   Special Requests NONE  Final   Gram Stain   Final    GRAM NEGATIVE RODS IN BOTH AEROBIC AND ANAEROBIC BOTTLES GRAM STAIN REVIEWED-AGREE WITH RESULT MVESTAL GRAM POSITIVE COCCI IN CHAINS ANAEROBIC BOTTLE ONLY    Culture PSEUDOMONAS AERUGINOSA ENTEROCOCCUS SPECIES   Final   Report Status PENDING  Incomplete   Organism ID, Bacteria PSEUDOMONAS AERUGINOSA  Final   Organism ID, Bacteria ENTEROCOCCUS SPECIES  Final      Susceptibility   Pseudomonas aeruginosa - MIC*    CEFTAZIDIME 4 SENSITIVE Sensitive     CIPROFLOXACIN >=4 RESISTANT Resistant     GENTAMICIN <=1 SENSITIVE Sensitive  IMIPENEM 2 SENSITIVE Sensitive     PIP/TAZO 8 SENSITIVE Sensitive     CEFEPIME 2 SENSITIVE Sensitive     * PSEUDOMONAS AERUGINOSA   Enterococcus species - MIC*    AMPICILLIN >=32 RESISTANT Resistant     VANCOMYCIN <=0.5 SENSITIVE Sensitive     GENTAMICIN SYNERGY SENSITIVE Sensitive     * ENTEROCOCCUS SPECIES  Gram stain     Status: None   Collection Time: 06/03/15  3:36 AM  Result Value Ref Range Status   Specimen Description BILE  Final   Special Requests NONE  Final   Gram Stain   Final    FEW WBC PRESENT, PREDOMINANTLY PMN MODERATE GRAM NEGATIVE RODS Gram Stain Report Called to,Read Back By and Verified With: T SPENCER,RN AT 1300  06/03/15 BY K BARR    Report Status 06/03/2015 FINAL  Final    Medical History: Past Medical History  Diagnosis Date  . COPD (chronic obstructive pulmonary disease)     O2 dependent. Followed by Dr. Joya Gaskins  . Fibromyalgia   . Hypertension   . OSA (obstructive sleep apnea)     Sleep study done April 02, 2008 showed severe obstructive sleep apnea.  . Diastolic dysfunction     Grade I diastolic dysfunction as shown on ECHO Dec 2011.  EF of 65-70%.  Marland Kitchen COPD (chronic obstructive pulmonary disease)     Followed by Dr. Joya Gaskins  . Dyslipidemia   . Osteopenia     DXA scan done on 10/23/2009 showed osteopenia of AP spine (young adult T-score -1.3), osteopenia of left femur neck (young adult T-score -1.8), and normal bone density of right femur neck (young adult T-score -0.8), with a FRAX estimate of the 10-year probability of a major osteoporotic fracture of 9.7% and probability of hip fracture 1.6%.  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 Estanislado Pandy on 11/10/2004.  MRA of the head on 03/09/2012 showed no evidence for recanalization.  . Osteoarthrosis involving multiple sites   . Chronic back pain   . Depression   . Anxiety   . Anemia   . GERD (gastroesophageal reflux disease)   . Meniere disease     Followed by ENT Dr. Silvestre Moment  . Diverticulosis of colon   . Multiple pigmented nevi   . Hypokalemia   . S/P hysterectomy 1975  . Pneumonia 11/2004  . Knee pain   . Dysphagia   . Urosepsis 11/04/2010    Klebsiella pneumoniae  . Breast pain   . Allergic rhinitis   . Fibromyalgia   . 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     . Urinary incontinence    Assessment: Day #5 of zosyn for  intra-abdominal infection. S/p lap chole with IOC on 7/1. 6/28 bile culture positive for pseudomonas sensitive to zosyn and also positive for enterococcus, sensitive to vancomycin.  To add vanc 7/2 for enterococcus in bile. WBC 39.6>27.6>15.3, afebrile.  Creat 0.62. Wt 107 kg.   6/28 Body fluid cx > pseudomonas aeruginosa and enterococcus  6/28 Zosyn >> 7/2 vanc (for enterococcus)>>   Goal of Therapy:  Vancomycin trough level 15-20 mcg/ml  Plan: add vanc 1 gm q12h for enterococcus in bile (dosed per obesity nomogram) Continue Zosyn 3.375G IV  q8h to be infused over 4 hours - pseudomonas sensitive Monitor clinical progress, renal function Home xarelto for afib on hold still   Eudelia Bunch, Pharm.D. [phone] 06/07/2015 3:19 PM

## 2015-06-07 NOTE — Evaluation (Signed)
Physical Therapy Evaluation Patient Details Name: Jennifer [name] MRN: [id number] DOB: 1941/01/30 Today's Date: 06/07/2015   History of Present Illness   Pt. Admitted 06/02/15 with abdominal pain and bloody drainage in her cholesystostomy drain.  She underwent lap chole on 06/06/15.  Pt. With h/o HTN, CHF, COPD, fibromyalgia, afib with RVR on xarelto, cholesystostomy tube placed 04/25/15 for acute cholecystitis.  Recent admission for acute portal vein thrombosis.  Has been at [facility] for her rehabilitation prior to this admission.  Clinical Impression  Pt. Presents to PT with decrease in her baseline functional mobility and gait, and decreased since she was at [facility] due to her acute illness.  She will benefit from acute PT to re-initiate functional mobility and gait training.  She will likely be best served to return to SNF for her ongoing rehab recovery due to her limited activity tolerance  at the present time.    Follow Up Recommendations SNF    Equipment Recommendations  None recommended by PT    Recommendations for Other Services       Precautions / Restrictions Precautions Precautions: Fall (right quadrant bulb drain in place) Restrictions Weight Bearing Restrictions: No      Mobility  Bed Mobility Overal bed mobility: Needs Assistance Bed Mobility: Supine to Sit     Supine to sit: Mod assist (rail)     General bed mobility comments: pt. needed min assist to move upright to sitting from sidelying  Transfers Overall transfer level: Needs assistance Equipment used: Rolling walker (2 wheeled) Transfers: Sit to/from Stand Sit to Stand: Min assist;+2 safety/equipment (daughter present )         General transfer comment: Min assist for stabilizing  Ambulation/Gait Ambulation/Gait assistance: Mod assist Ambulation Distance (Feet): 3 Feet Assistive device: Rolling walker (2 wheeled) Gait Pattern/deviations: Step-to pattern     General Gait Details: Pt.  able to take 3 pivoting steps to recliner with mod assist for stability  Stairs            Wheelchair Mobility    Modified Rankin (Stroke Patients Only)       Balance Overall balance assessment: Needs assistance Sitting-balance support: No upper extremity supported;Feet supported Sitting balance-Leahy Scale: Fair     Standing balance support: Bilateral upper extremity supported;During functional activity Standing balance-Leahy Scale: Poor Standing balance comment: use of RW for support                             Pertinent Vitals/Pain Pain Assessment: 0-10 Pain Score: 10-Worst pain ever Pain Location: right lower quadrant Pain Descriptors / Indicators: Discomfort Pain Intervention(s): Limited activity within patient's tolerance;Monitored during session;Premedicated before session    Home Living Family/patient expects to be discharged to:: Skilled nursing facility                 Additional Comments: Pt. plans to return to [facility]  to finish her rehab    Prior Function Level of Independence: Independent with assistive device(s)         Comments: used cane prior to onset of current medical issues which began in May     Hand Dominance   Dominant Hand: Right    Extremity/Trunk Assessment   Upper Extremity Assessment: Overall WFL for tasks assessed;Generalized weakness           Lower Extremity Assessment: Overall WFL for tasks assessed;Generalized weakness      Cervical / Trunk Assessment: Normal  Communication   Communication: No difficulties  Cognition Arousal/Alertness: Awake/alert Behavior During Therapy: WFL for tasks assessed/performed Overall Cognitive Status: Within Functional Limits for tasks assessed                      General Comments      Exercises        Assessment/Plan    PT Assessment Patient needs continued PT services  PT Diagnosis Difficulty walking;Generalized weakness;Acute pain    PT Problem List Decreased strength;Decreased activity tolerance;Decreased balance;Decreased mobility;Decreased knowledge of precautions;Pain  PT Treatment Interventions DME instruction;Gait training;Functional mobility training;Therapeutic activities;Therapeutic exercise;Balance training;Patient/family education   PT Goals (Current goals can be found in the Care Plan section) Acute Rehab PT Goals Patient Stated Goal: back to [facility] for further rehab then home PT Goal Formulation: With patient Time For Goal Achievement: 06/21/15 Potential to Achieve Goals: Good    Frequency Min 3X/week   Barriers to discharge        Co-evaluation               End of Session Equipment Utilized During Treatment: Gait belt Activity Tolerance: Patient tolerated treatment well;Patient limited by fatigue Patient left: in chair;with call bell/phone within reach;with family/visitor present (daughter present and attentive to her mom's needs) Nurse Communication: Mobility status         Time: 7903-8333 PT Time Calculation (min) (ACUTE ONLY): 31 min   Charges:   PT Evaluation $Initial PT Evaluation Tier I: 1 Procedure PT Treatments $Gait Training: 8-22 mins   PT G CodesLadona Ridgel 06/07/2015, 4:02 PM Gerlean Ren PT Acute Rehab Services [place] [phone]

## 2015-06-07 NOTE — Progress Notes (Signed)
Pt states that she doesn't need assistance with applying CPAP. PT will apply when she gets ready to go to sleep.  RT will continue to monitor.

## 2015-06-07 NOTE — Progress Notes (Signed)
Internal Medicine Attending  Date: 06/07/2015  Patient name: Jennifer [name] Medical record number: [id number] Date of birth: 01-18-1941 Age: 74 y.o. Gender: female  I saw and evaluated the patient. I reviewed the resident's note by Dr. Ronnald Ramp and I agree with the resident's findings and plans as documented in his progress note.  Ms. Ewell is one-day status post cholecystectomy. Although she continues to have some right upper quadrant pain she is feeling improved from her preoperative status. We are holding anticoagulation in the perioperative setting, repleating her hypokalemia and continuing her other chronic medical therapies.

## 2015-06-08 DIAGNOSIS — B952 Enterococcus as the cause of diseases classified elsewhere: Secondary | ICD-10-CM

## 2015-06-08 LAB — COMPREHENSIVE METABOLIC PANEL
ALBUMIN: 1.9 g/dL — AB (ref 3.5–5.0)
ALT: 84 U/L — ABNORMAL HIGH (ref 14–54)
ANION GAP: 6 (ref 5–15)
AST: 38 U/L (ref 15–41)
Alkaline Phosphatase: 342 U/L — ABNORMAL HIGH (ref 38–126)
BUN: 6 mg/dL (ref 6–20)
CO2: 24 mmol/L (ref 22–32)
Calcium: 8.8 mg/dL — ABNORMAL LOW (ref 8.9–10.3)
Chloride: 103 mmol/L (ref 101–111)
Creatinine, Ser: 0.53 mg/dL (ref 0.44–1.00)
GFR calc Af Amer: >60 mL/min (ref >60)
GFR calc non Af Amer: >60 mL/min (ref >60)
GLUCOSE: 109 mg/dL — AB (ref 65–99)
Potassium: 4 mmol/L (ref 3.5–5.1)
Sodium: 133 mmol/L — ABNORMAL LOW (ref 135–145)
Total Bilirubin: 1.2 mg/dL (ref 0.3–1.2)
Total Protein: 5.3 g/dL — ABNORMAL LOW (ref 6.5–8.1)

## 2015-06-08 LAB — CBC
HCT: 25.2 % — ABNORMAL LOW (ref 36.0–46.0)
HEMOGLOBIN: 8.1 g/dL — AB (ref 12.0–15.0)
MCH: 31.5 pg (ref 26.0–34.0)
MCHC: 32.1 g/dL (ref 30.0–36.0)
MCV: 98.1 fL (ref 78.0–100.0)
Platelets: 384 10*3/uL (ref 150–400)
RBC: 2.57 MIL/uL — AB (ref 3.87–5.11)
RDW: 14.4 % (ref 11.5–15.5)
WBC: 11.6 10*3/uL — ABNORMAL HIGH (ref 4.0–10.5)

## 2015-06-08 LAB — MAGNESIUM: Magnesium: 1.8 mg/dL (ref 1.7–2.4)

## 2015-06-08 MED ORDER — MORPHINE SULFATE 2 MG/ML IJ SOLN
1.0000 mg | INTRAMUSCULAR | Status: DC | PRN
  Administered 2015-06-08: 2 mg via INTRAVENOUS
  Filled 2015-06-08: qty 1

## 2015-06-08 MED ORDER — ENOXAPARIN SODIUM 40 MG/0.4ML ~~LOC~~ SOLN
40.0000 mg | SUBCUTANEOUS | Status: DC
  Administered 2015-06-08 – 2015-06-09 (×2): 40 mg via SUBCUTANEOUS
  Filled 2015-06-08 (×2): qty 0.4

## 2015-06-08 NOTE — Progress Notes (Signed)
[facility] Progress Note  2 Days Post-Op  Subjective: Pt doing better, but anorexic.  All LFT's trending down.  No N/V, pain better controlled.  Ambulated once yesterday.  Daughter at bedside.  She doesn't like the food.  Last BM on 6/30.  Objective: Vital signs in last 24 hours: Temp:  [98.6 F (37 C)-99.4 F (37.4 C)] 99.4 F (37.4 C) (07/02 2031) Pulse Rate:  [73-90] 73 (07/03 0639) Resp:  [16-17] 17 (07/03 0639) BP: (120-125)/(56-68) 125/68 mmHg (07/03 0639) SpO2:  [93 %-98 %] 97 % (07/03 0639) Last BM Date: 06/05/15  Intake/Output from previous day: 07/02 0701 - 07/03 0700 In: 2313 [P.O.:1360; I.V.:3; IV Piggyback:950] Out: 85 [Drains:85] Intake/Output this shift:    PE: Gen:  Alert, NAD, pleasant Abd: Soft, mild distension, mild tenderness, +BS, no HSM, incisions C/D/I, drain with more serosanguinous drainage today   Lab Results:   Recent Labs  06/07/15 0338 06/08/15 0426  WBC 15.3* 11.6*  HGB 8.6* 8.1*  HCT 26.2* 25.2*  PLT 403* 384   BMET  Recent Labs  06/07/15 0338 06/08/15 0426  NA 132* 133*  K 3.4* 4.0  CL 98* 103  CO2 25 24  GLUCOSE 90 109*  BUN 11 6  CREATININE 0.62 0.53  CALCIUM 8.3* 8.8*   PT/INR No results for input(s): LABPROT, INR in the last 72 hours. CMP     Component Value Date/Time   NA 133* 06/08/2015 0426   K 4.0 06/08/2015 0426   CL 103 06/08/2015 0426   CO2 24 06/08/2015 0426   GLUCOSE 109* 06/08/2015 0426   BUN 6 06/08/2015 0426   CREATININE 0.53 06/08/2015 0426   CREATININE 0.61 12/12/2014 1228   CALCIUM 8.8* 06/08/2015 0426   PROT 5.3* 06/08/2015 0426   ALBUMIN 1.9* 06/08/2015 0426   AST 38 06/08/2015 0426   ALT 84* 06/08/2015 0426   ALKPHOS 342* 06/08/2015 0426   BILITOT 1.2 06/08/2015 0426   GFRNONAA >60 06/08/2015 0426   GFRNONAA 89 12/12/2014 1228   GFRAA >60 06/08/2015 0426   GFRAA >89 12/12/2014 1228   Lipase     Component Value Date/Time   LIPASE 11* 06/04/2015 1329        Studies/Results: Dg Cholangiogram Operative  06/06/2015   CLINICAL DATA:  Intraoperative cholangiogram for cholecystitis  EXAM: INTRAOPERATIVE CHOLANGIOGRAM  FLUOROSCOPY TIME:  9 seconds  COMPARISON:  Ultrasound fluoroscopic guided cholecystostomy tube placement - 04/26/2015; cholangiogram via existing cholecystostomy tube -06/03/2015; 05/25/2015; CT abdomen pelvis -06/02/2025  FINDINGS: Surgical clips overlie the expected location of the neck of the gallbladder.  A cholecystostomy tube overlies expected location of the gallbladder fossa.  Contrast injection fails to opacify any identifiable portion of the biliary system extravasation of contrast about the gallbladder fossa.  IMPRESSION: Nondiagnostic cholangiogram as above. Further evaluation with ERCP could be performed as clinically indicated   Electronically Signed   By: Sandi Mariscal M.D.   On: 06/06/2015 12:01    Anti-infectives: Anti-infectives    Start     Dose/Rate Route Frequency Ordered Stop   06/07/15 1800  vancomycin (VANCOCIN) IVPB 1000 mg/200 mL premix     1,000 mg 200 mL/hr over 60 Minutes Intravenous Every 12 hours 06/07/15 1511     06/03/15 1200  piperacillin-tazobactam (ZOSYN) IVPB 3.375 g     3.375 g 12.5 mL/hr over 240 Minutes Intravenous 3 times per day 06/03/15 0700     06/03/15 0315  piperacillin-tazobactam (ZOSYN) IVPB 3.375 g     3.375 g 100  mL/hr over 30 Minutes Intravenous  Once 06/03/15 0313 06/03/15 0428       Assessment/Plan H/o Acute cholecystitis with calculus - s/p perc chole tube 04/25/15, POD #2 s/p lap chole with IOC 06/06/15 CBD obstruction from blood clot Hemobilia Transaminitis  Elevated Alk Phos  Leukocytosis  Elevated bilirubin  ?Left hepatic vein/PV thrombosis AFIB on Xarelto -Cont to hold Xarelto, start SQ lovenox -IVF, pain control, antiemetics -Advance to low fat diet, anorexic -Limit IV narcotics, orals first -Ambulate and IS -Drain with 11m out, continue drain for now,  possibility it will need to stay in at discharge -SCD's and start lovenox, but monitor closely, Hgb 8.1 -Continue to recheck labs -Keep here today, possibly d/c tomororow if labs normalized meeting all d/c goals    LOS: 5 days    MNat Christen7/02/2015, 8:58 AM Pager: [phone]

## 2015-06-08 NOTE — Progress Notes (Signed)
RT Note:  Pt places self on/off cpap.  Rt will continue to monitor.

## 2015-06-08 NOTE — Progress Notes (Signed)
Subjective: Patient was seen and examined at bedside today. Vitals are stable, no fever. Patient complaining of generalized abdominal tenderness, especially at the site of JP drain placement. No nausea or vomiting. Patient had an episode of dizziness yesterday and was given Meclizine. At present, patient has not ambulated out of bed, and as such, does not know if she is dizzy anymore. K has trended up to 4.0, was 3.4 yesterday. LFTs trending down.    Objective: Vital signs in last 24 hours: Filed Vitals:   06/07/15 2031 06/07/15 2107 06/08/15 0639 06/08/15 0924  BP: 120/64  125/68   Pulse: 78  73   Temp: 99.4 F (37.4 C)     TempSrc: Oral  Oral   Resp: 17  17   Height:      Weight:      SpO2: 98% 93% 97% 96%   Weight change:   Intake/Output Summary (Last 24 hours) at 06/08/15 0949 Last data filed at 06/08/15 1937  Gross per 24 hour  Intake   1873 ml  Output     85 ml  Net   1788 ml   Physical Exam  General: resting comfortably in bed, NAD HEENT: PERRL, EOMI, no scleral icterus Cardiac: RRR, no rubs, murmurs or gallops Pulm: clear to auscultation bilaterally, no wheezes, rales, or rhonchi Abd: soft, mild generalized abdominal tenderness upon palpation. +BS. JP drain contains approximately 10 cc of serosanginous fluid. Site of tube placement covered with gauze.  Ext: warm and well perfused, no pedal edema Neuro: alert and oriented X3, cranial nerves II-XII grossly intact, motor strength grossly intact, sensation to light touch intact  Lab Results: Basic Metabolic Panel:  Recent Labs Lab 06/06/15 0737 06/07/15 0338 06/08/15 0426  NA 131* 132* 133*  K 3.4* 3.4* 4.0  CL 97* 98* 103  CO2 24 25 24   GLUCOSE 97 90 109*  BUN 16 11 6   CREATININE 0.71 0.62 0.53  CALCIUM 8.8* 8.3* 8.8*  MG 2.0  --  1.8   Liver Function Tests:  Recent Labs Lab 06/07/15 0338 06/08/15 0426  AST 54* 38  ALT 125* 84*  ALKPHOS 449* 342*  BILITOT 1.1 1.2  PROT 5.7* 5.3*  ALBUMIN 1.9*  1.9*    Recent Labs Lab 06/02/15 2351 06/04/15 1329  LIPASE 11* 11*   CBC:  Recent Labs Lab 06/02/15 2351  06/07/15 0338 06/08/15 0426  WBC 15.4*  < > 15.3* 11.6*  NEUTROABS 12.2*  --   --   --   HGB 11.0*  < > 8.6* 8.1*  HCT 33.8*  < > 26.2* 25.2*  MCV 99.1  < > 96.3 98.1  PLT 412*  < > 403* 384  < > = values in this interval not displayed.  Urinalysis:  Recent Labs Lab 06/03/15 0027  COLORURINE YELLOW  LABSPEC 1.010  PHURINE 7.0  GLUCOSEU NEGATIVE  HGBUR NEGATIVE  BILIRUBINUR NEGATIVE  KETONESUR NEGATIVE  PROTEINUR NEGATIVE  UROBILINOGEN 1.0  NITRITE NEGATIVE  LEUKOCYTESUR TRACE*    Micro Results: Recent Results (from the past 240 hour(s))  Culture, body fluid-bottle     Status: None (Preliminary result)   Collection Time: 06/03/15  3:36 AM  Result Value Ref Range Status   Specimen Description BILE  Final   Special Requests NONE  Final   Gram Stain   Final    GRAM NEGATIVE RODS IN BOTH AEROBIC AND ANAEROBIC BOTTLES GRAM STAIN REVIEWED-AGREE WITH RESULT MVESTAL GRAM POSITIVE COCCI IN CHAINS ANAEROBIC BOTTLE ONLY  Culture PSEUDOMONAS AERUGINOSA ENTEROCOCCUS SPECIES   Final   Report Status PENDING  Incomplete   Organism ID, Bacteria PSEUDOMONAS AERUGINOSA  Final   Organism ID, Bacteria ENTEROCOCCUS SPECIES  Final      Susceptibility   Pseudomonas aeruginosa - MIC*    CEFTAZIDIME 4 SENSITIVE Sensitive     CIPROFLOXACIN >=4 RESISTANT Resistant     GENTAMICIN <=1 SENSITIVE Sensitive     IMIPENEM 2 SENSITIVE Sensitive     PIP/TAZO 8 SENSITIVE Sensitive     CEFEPIME 2 SENSITIVE Sensitive     * PSEUDOMONAS AERUGINOSA   Enterococcus species - MIC*    AMPICILLIN >=32 RESISTANT Resistant     VANCOMYCIN <=0.5 SENSITIVE Sensitive     GENTAMICIN SYNERGY SENSITIVE Sensitive     * ENTEROCOCCUS SPECIES  Gram stain     Status: None   Collection Time: 06/03/15  3:36 AM  Result Value Ref Range Status   Specimen Description BILE  Final   Special Requests  NONE  Final   Gram Stain   Final    FEW WBC PRESENT, PREDOMINANTLY PMN MODERATE GRAM NEGATIVE RODS Gram Stain Report Called to,Read Back By and Verified With: T SPENCER,RN AT 1300 06/03/15 BY K BARR    Report Status 06/03/2015 FINAL  Final   Studies/Results: Dg Cholangiogram Operative  06/06/2015   CLINICAL DATA:  Intraoperative cholangiogram for cholecystitis  EXAM: INTRAOPERATIVE CHOLANGIOGRAM  FLUOROSCOPY TIME:  9 seconds  COMPARISON:  Ultrasound fluoroscopic guided cholecystostomy tube placement - 04/26/2015; cholangiogram via existing cholecystostomy tube -06/03/2015; 05/25/2015; CT abdomen pelvis -06/02/2025  FINDINGS: Surgical clips overlie the expected location of the neck of the gallbladder.  A cholecystostomy tube overlies expected location of the gallbladder fossa.  Contrast injection fails to opacify any identifiable portion of the biliary system extravasation of contrast about the gallbladder fossa.  IMPRESSION: Nondiagnostic cholangiogram as above. Further evaluation with ERCP could be performed as clinically indicated   Electronically Signed   By: Sandi Mariscal M.D.   On: 06/06/2015 12:01   Medications: I have reviewed the patient's current medications. Scheduled Meds: . acetaminophen  650 mg Oral QID  . amiodarone  200 mg Oral BID  . amitriptyline  50 mg Oral QHS  . bisacodyl  10 mg Rectal Daily  . enoxaparin (LOVENOX) injection  40 mg Subcutaneous Q24H  . mometasone-formoterol  2 puff Inhalation BID  . NIFEdipine  90 mg Oral Daily  . pantoprazole  40 mg Oral Daily  . piperacillin-tazobactam (ZOSYN)  IV  3.375 g Intravenous 3 times per day  . senna-docusate  1 tablet Oral BID  . sertraline  100 mg Oral BID  . sodium chloride  3 mL Intravenous Q12H  . tiotropium  18 mcg Inhalation Daily  . topiramate  50 mg Oral BID  . vancomycin  1,000 mg Intravenous Q12H   Continuous Infusions: . lactated ringers     PRN Meds:.albuterol, ALPRAZolam, meclizine, methocarbamol, morphine  injection, oxyCODONE, promethazine, sodium phosphate  Assessment/Plan:  74 y/o F with a PMHx of anemia, GERD, PAF, depression, and anxiety admitted for cholecystitis with bleeding from cholecystostomy site, possible portal vein thrombosis with worsening LFTs, biliary pseudomonas and enterococcus infection. Patient underwent cholecystectomy on 06/06/15 and currently has a JP drain in place.   Cholecystitis s/p cholecystectomy - post op day #2. Patient doing well, JP drain in place. LFTs and WBCs trending down. Patient still complaining of mild diffuse abdominal tenderness. Patient's bile culture grew pseudomonas and enteroccous.  - As per pharmacy recommendation,  continue Zosyn 3.375G IV q8h for pseudomonas and Vancomycin 1 gm q12h for enterococcus. Appreciate pharmacy recs. - Continue pain management with Oxycodone 5mg  Q4 PRN and Morphine 2mg /ml injection 1-3 mg Q4 PRN - Monitor CBC and LFTs  Portal vein thrombosis  - Hold anticoagulation until post-op status is improved.   Afib with RVR - chads2vas 3 - likely was initiated by sepsis/acute portal vein thrombosis last admisison. Patient was on Xarelto, discontinued in the setting of surgery and cholecystotomy bleeding.  - cont Amiodarone for now. - hold Xarelto as above. F/u with surgery and appreciate their recomendation.   Hypokalemia - likely 2/2 to fluid losses via biliary tract. Patient's K has trended up since the surgery, 3.4 -->4.0. - Continue K-dur 40 mEq QD - Continue to monitor K values and replete PRN.  - Monitor Mag level, 1.8 today.  - Monitor BMP  HTN - BP stable. Patient on HCTZ 12.5mg  daily and nifedipine 90mg  daily at home.  - hold HCTZ b/c of hypokalemia. Cont nifedipine.   CHF - last TTE 04/2015 w/ EF 55-60% with grade 1 diastolic dysfunction  COPD-- stable -cont dulera1 puff bid, spiriva 18 mcg inh daily, and albuterol 1-2 puff q6hprn  Fibromyalgia- at home she takes norco 7.5-325mg  QID prn, amitriptyline 50mg  qhs,  carisoprodol 350mg  qd prn - pain management as above  OSA- stable - cont CPAP qhs  Anxiety and depression-- cont home zoloft 100mg  BID, elavil 50mg  qhs, and xanax 0.5mg  TID prn  Migraines- cont home med Topamax 50mg  BID  Pre-diabetes -hgba1c 6.0 - lifestyle mod.  DVT ppx - SCDs and Lovenox  Diet: full liquids  Code: full.  Dispo: Disposition is deferred at this time, awaiting improvement of current medical problems.  Anticipated discharge in approximately 1-2 day(s).   The patient does have a current PCP Bertha Stakes, MD) and does need an [facility] hospital follow-up appointment after discharge.  The patient does not have transportation limitations that hinder transportation to clinic appointments.  .Services Needed at time of discharge: Y = Yes, Blank = No PT:   OT:   RN:   Equipment:   Other:     LOS: 5 days   Shela Leff, MD 06/08/2015, 9:49 AM

## 2015-06-08 NOTE — Progress Notes (Signed)
Internal Medicine Attending  Date: 06/08/2015  Patient name: Jennifer [name] Medical record number: [id number] Date of birth: [birth year] Age: 74 y.o. Gender: female  I saw and evaluated the patient. I reviewed the resident's note by Dr. Marlowe Sax and I agree with the resident's findings and plans as documented in her progress note.  She feels much improved today with decreased right upper quadrant pain compared to yesterday. She is tolerated her liquid diet well. She continues to have serosanguineous drainage from the JP, but her liver function tests are trending downwards. Her enterococcus and Pseudomonas are being treated with vancomycin and Zosyn respectively. We will continue supportive care and reassess in the morning as we advance her diet. Given the degree of drainage through the JP I suspect she will be discharged with this in place. We appreciate surgery's help and advice in the management of her care.

## 2015-06-09 LAB — COMPREHENSIVE METABOLIC PANEL
ALT: 64 U/L — AB (ref 14–54)
AST: 29 U/L (ref 15–41)
Albumin: 2.1 g/dL — ABNORMAL LOW (ref 3.5–5.0)
Alkaline Phosphatase: 296 U/L — ABNORMAL HIGH (ref 38–126)
Anion gap: 10 (ref 5–15)
BUN: <5 mg/dL — ABNORMAL LOW (ref 6–20)
CALCIUM: 8.6 mg/dL — AB (ref 8.9–10.3)
CO2: 22 mmol/L (ref 22–32)
CREATININE: 0.62 mg/dL (ref 0.44–1.00)
Chloride: 102 mmol/L (ref 101–111)
GFR calc Af Amer: >60 mL/min (ref >60)
GFR calc non Af Amer: >60 mL/min (ref >60)
Glucose, Bld: 105 mg/dL — ABNORMAL HIGH (ref 65–99)
Potassium: 4 mmol/L (ref 3.5–5.1)
Sodium: 134 mmol/L — ABNORMAL LOW (ref 135–145)
TOTAL PROTEIN: 5.7 g/dL — AB (ref 6.5–8.1)
Total Bilirubin: 0.9 mg/dL (ref 0.3–1.2)

## 2015-06-09 LAB — CBC
HCT: 25.5 % — ABNORMAL LOW (ref 36.0–46.0)
HEMOGLOBIN: 8.4 g/dL — AB (ref 12.0–15.0)
MCH: 32.9 pg (ref 26.0–34.0)
MCHC: 32.9 g/dL (ref 30.0–36.0)
MCV: 100 fL (ref 78.0–100.0)
PLATELETS: 350 10*3/uL (ref 150–400)
RBC: 2.55 MIL/uL — ABNORMAL LOW (ref 3.87–5.11)
RDW: 14.4 % (ref 11.5–15.5)
WBC: 8.9 10*3/uL (ref 4.0–10.5)

## 2015-06-09 MED ORDER — PIPERACILLIN-TAZOBACTAM 3.375 G IVPB: 3.3750 g | Freq: Three times a day (TID) | INTRAVENOUS | Status: DC

## 2015-06-09 MED ORDER — VANCOMYCIN HCL IN DEXTROSE 1-5 GM/200ML-% IV SOLN: 1000.0000 mg | Freq: Two times a day (BID) | INTRAVENOUS | Status: DC

## 2015-06-09 MED ORDER — ALPRAZOLAM 0.5 MG PO TABS: 0.5000 mg | ORAL_TABLET | Freq: Three times a day (TID) | ORAL | Status: DC | PRN

## 2015-06-09 MED ORDER — OXYCODONE HCL 5 MG PO TABS: 5.0000 mg | ORAL_TABLET | ORAL | Status: DC | PRN

## 2015-06-09 MED ORDER — RIVAROXABAN 20 MG PO TABS: 20.0000 mg | ORAL_TABLET | Freq: Every day | ORAL | Status: DC

## 2015-06-09 MED ORDER — SENNOSIDES-DOCUSATE SODIUM 8.6-50 MG PO TABS: 1.0000 | ORAL_TABLET | Freq: Two times a day (BID) | ORAL | Status: DC

## 2015-06-09 NOTE — Progress Notes (Signed)
Report given to [facility], RN stated that if patient on vancomycin, they need PICC line. MD made aware. Awaiting for orders.

## 2015-06-09 NOTE — Clinical Social Work Note (Signed)
CSW received phone call from bedside nurse who was informed by [facility] that patient needs to have a picc line to continue to take IV antibiotics.  Bedside nurse contacted physician who wrote order for picc line, patient will not be ready to discharge today due to waiting for the picc line.  Plan for patient to hopefully discharge tomorrow back to [facility], once picc line has been inserted.  CSW to continue to follow patient's discharge planning.  Jones Broom. [place], MSW, [place] 06/09/2015 4:03 PM

## 2015-06-09 NOTE — Progress Notes (Signed)
Physical Therapy Treatment Patient Details Name: Jennifer [name] MRN: [id number] DOB: 1941-02-28 Today's Date: 06/09/2015    History of Present Illness pt is a 74 y/o female recently discharged with cholesistotomy drain to SNF.  Now returns with bleeding  from drain and abdominal pain.    PT Comments    Pt ambulated 50 into hallway, requiring 1 sitting rest break 2/2 pain in L foot 2/2 pre-existing arthritis.  Mild instability w/ ambulation and relies on RW for support.  Pt will benefit from continued skilled PT services to increase functional independence and safety.   Follow Up Recommendations  SNF     Equipment Recommendations  None recommended by PT    Recommendations for Other Services       Precautions / Restrictions Precautions Precautions: Fall;Other (comment) (right quadrant bulb drain in place) Restrictions Weight Bearing Restrictions: No    Mobility  Bed Mobility Overal bed mobility: Needs Assistance Bed Mobility: Supine to Sit     Supine to sit: Min assist     General bed mobility comments: Min assist to achieve supine>sit to push up.  VCs for technique w/ increased time.  Transfers Overall transfer level: Needs assistance Equipment used: Rolling walker (2 wheeled) Transfers: Sit to/from Stand Sit to Stand: Min assist         General transfer comment: Min assist to power up to standing and ensuring stabilization upon standing.  Pt requires increased time to push up and cues for hand placement  Ambulation/Gait Ambulation/Gait assistance: Min guard Ambulation Distance (Feet): 50 Feet Assistive device: Rolling walker (2 wheeled) Gait Pattern/deviations: Step-through pattern;Antalgic;Trunk flexed;Decreased weight shift to left;Decreased stance time - left   Gait velocity interpretation: Below normal speed for age/gender General Gait Details: Pt ambulated in hall and took 1 rest break after ambulating 25 ft 2/2 pain in L foot 2/2 pre-existing arthritis.     Stairs            Wheelchair Mobility    Modified Rankin (Stroke Patients Only)       Balance Overall balance assessment: Needs assistance Sitting-balance support: No upper extremity supported;Feet supported Sitting balance-Leahy Scale: Fair     Standing balance support: Bilateral upper extremity supported;During functional activity Standing balance-Leahy Scale: Poor Standing balance comment: pt relies on RW for support                    Cognition Arousal/Alertness: Awake/alert Behavior During Therapy: WFL for tasks assessed/performed Overall Cognitive Status: Within Functional Limits for tasks assessed                      Exercises General Exercises - Lower Extremity Ankle Circles/Pumps: AROM;Both;15 reps;Supine Heel Slides: AROM;Both;10 reps;Supine    General Comments        Pertinent Vitals/Pain Pain Assessment: Faces Faces Pain Scale: Hurts little more Pain Location: L foot Pain Descriptors / Indicators: Aching;Grimacing Pain Intervention(s): Limited activity within patient's tolerance;Monitored during session    Home Living                      Prior Function            PT Goals (current goals can now be found in the care plan section) Acute Rehab PT Goals Patient Stated Goal: back to [facility] for further rehab then home PT Goal Formulation: With patient/family Time For Goal Achievement: 06/21/15 Potential to Achieve Goals: Good Progress towards PT goals: Progressing toward goals  Frequency  Min 3X/week    PT Plan Current plan remains appropriate    Co-evaluation             End of Session Equipment Utilized During Treatment: Gait belt Activity Tolerance: Patient limited by pain;Patient limited by fatigue Patient left: in chair;with call bell/phone within reach;with family/visitor present     Time: 1007-1027 PT Time Calculation (min) (ACUTE ONLY): 20 min  Charges:  $Gait Training: 8-22  mins                    G Codes:      Joslyn Hy PT, [place] [phone] Pager: [phone] 06/09/2015, 11:50 AM

## 2015-06-09 NOTE — Progress Notes (Signed)
[facility] Progress Note  3 Days Post-Op  Subjective: Pt feels good, more alert and awake today.  Pain well controlled.  No N/V tolerating HH diet.  Had a BM and flatus.  Labs improving.    Objective: Vital signs in last 24 hours: Temp:  [97.9 F (36.6 C)-99.6 F (37.6 C)] 98.2 F (36.8 C) (07/04 0532) Pulse Rate:  [74-88] 74 (07/04 0532) Resp:  [16-18] 16 (07/04 0532) BP: (130-143)/(68-71) 143/71 mmHg (07/04 0532) SpO2:  [95 %-99 %] 99 % (07/04 0532) Last BM Date: 06/08/15  Intake/Output from previous day: 07/03 0701 - 07/04 0700 In: 463 [P.O.:360; I.V.:3; IV Piggyback:100] Out: 65 [Drains:65] Intake/Output this shift: Total I/O In: -  Out: 200 [Urine:200]  PE:  Gen: Alert, NAD, pleasant Card: RRR, no M/G/R heard Pulm: CTA, no W/R/R Abd: Soft, ND, minimal tenderness, Excellent BS, no HSM, incisions C/D/I, drain with sanguinous drainage (19mL/24hr)   Lab Results:   Recent Labs  06/08/15 0426 06/09/15 0341  WBC 11.6* 8.9  HGB 8.1* 8.4*  HCT 25.2* 25.5*  PLT 384 350   BMET  Recent Labs  06/08/15 0426 06/09/15 0341  NA 133* 134*  K 4.0 4.0  CL 103 102  CO2 24 22  GLUCOSE 109* 105*  BUN 6 <5*  CREATININE 0.53 0.62  CALCIUM 8.8* 8.6*   PT/INR No results for input(s): LABPROT, INR in the last 72 hours. CMP     Component Value Date/Time   NA 134* 06/09/2015 0341   K 4.0 06/09/2015 0341   CL 102 06/09/2015 0341   CO2 22 06/09/2015 0341   GLUCOSE 105* 06/09/2015 0341   BUN <5* 06/09/2015 0341   CREATININE 0.62 06/09/2015 0341   CREATININE 0.61 12/12/2014 1228   CALCIUM 8.6* 06/09/2015 0341   PROT 5.7* 06/09/2015 0341   ALBUMIN 2.1* 06/09/2015 0341   AST 29 06/09/2015 0341   ALT 64* 06/09/2015 0341   ALKPHOS 296* 06/09/2015 0341   BILITOT 0.9 06/09/2015 0341   GFRNONAA >60 06/09/2015 0341   GFRNONAA 89 12/12/2014 1228   GFRAA >60 06/09/2015 0341   GFRAA >89 12/12/2014 1228   Lipase     Component Value Date/Time   LIPASE  11* 06/04/2015 1329       Studies/Results: No results found.  Anti-infectives: Anti-infectives    Start     Dose/Rate Route Frequency Ordered Stop   06/07/15 1800  vancomycin (VANCOCIN) IVPB 1000 mg/200 mL premix     1,000 mg 200 mL/hr over 60 Minutes Intravenous Every 12 hours 06/07/15 1511     06/03/15 1200  piperacillin-tazobactam (ZOSYN) IVPB 3.375 g     3.375 g 12.5 mL/hr over 240 Minutes Intravenous 3 times per day 06/03/15 0700     06/03/15 0315  piperacillin-tazobactam (ZOSYN) IVPB 3.375 g     3.375 g 100 mL/hr over 30 Minutes Intravenous  Once 06/03/15 0313 06/03/15 0428       Assessment/Plan H/o Acute cholecystitis with calculus - s/p perc chole tube 04/25/15, POD #3 s/p lap chole with IOC 06/06/15 - Dr. Georgette Dover CBD obstruction from blood clot Hemobilia Transaminitis  Elevated Alk Phos  Leukocytosis  Elevated bilirubin  ?Left hepatic vein/PV thrombosis AFIB on Xarelto -Would be hesitant to resume Xarelto (will let Dr. Georgette Dover decide), Cont SQ lovenox -IVF, pain control, antiemetics -Tolerating low fat diet well -Oral pain meds -Ambulate and IS -Drain with 17mL out, will discuss about removal prior to discharge -Bile fluid culture grew pseudomonas aeruginosa and enterococcus - cipro  is resistant,  Zosyn Day #7, Vancomycin Day #3, 7 day course may be adequate for pseudomonas, but may need vanc IV for enterococcus for a total of 7 days -SCD's and lovenox, Hgb 8.4 -Possibly d/c today will discuss with Dr. Georgette Dover -Follow up with [facility] on July 19th at 3:45pm, arrive 30 min early    LOS: 6 days    Nat Christen 06/09/2015, 8:48 AM Pager: [phone]

## 2015-06-09 NOTE — Clinical Social Work Note (Addendum)
Patient to be d/c'ed tomorrow to [facility] due to patient awaiting picc line placement.  Patient and family agreeable to plans will transport via ems RN to call report.   Evette Cristal, MSW, [place]

## 2015-06-09 NOTE — Progress Notes (Signed)
Internal Medicine Attending  Date: 06/09/2015  Patient name: Jennifer [name] Medical record number: [id number] Date of birth: 1941-01-19 Age: 74 y.o. Gender: female  I saw and evaluated the patient. I reviewed the resident's note by Dr. Marlowe Sax and I agree with the resident's findings and plans as documented in her progress note.  Please see my addendum to the discharge summary for the specifics of my assessment from earlier today.  Of note, the decision on the antibiotics is as per [place].

## 2015-06-09 NOTE — Discharge Summary (Deleted)
Name: Jennifer [name] MRN: [id number] DOB: 03-20-1941 74 y.o. PCP: Bertha Stakes, MD  Date of Admission: 06/02/2015  9:30 PM Date of Discharge: 06/09/2015 Attending Physician: Madilyn Fireman, MD  Discharge Diagnosis:  1. Cholecystitis s/p cholecystostomy and cholecystectomy 2. Biliary Infection 3. Atrial Fibrillation 4. Hypokalemia 5. HTN 6. COPD  Discharge Medications:   Medication List    STOP taking these medications        carisoprodol 350 MG tablet  Commonly known as:  SOMA     CINNAMON PO     dextromethorphan-guaiFENesin 30-600 MG per 12 hr tablet  Commonly known as:  MUCINEX DM     hydrochlorothiazide 25 MG tablet  Commonly known as:  HYDRODIURIL     HYDROcodone-acetaminophen 5-325 MG per tablet  Commonly known as:  NORCO/VICODIN     ibuprofen 200 MG tablet  Commonly known as:  ADVIL,MOTRIN     mupirocin ointment 2 %  Commonly known as:  BACTROBAN     potassium chloride SA 20 MEQ tablet  Commonly known as:  K-DUR,KLOR-CON     ranitidine 150 MG tablet  Commonly known as:  ZANTAC      TAKE these medications        albuterol 108 (90 BASE) MCG/ACT inhaler  Commonly known as:  PROAIR HFA  INHALE 2 PUFFS BY MOUTH INTO LUNGS FOUR TIMES DAILY AS NEEDED     ALPRAZolam 0.5 MG tablet  Commonly known as:  XANAX  Take 1 tablet (0.5 mg total) by mouth 3 (three) times daily as needed for anxiety.     amiodarone 200 MG tablet  Commonly known as:  PACERONE  Take 1 tablet (200 mg total) by mouth 2 (two) times daily.     amitriptyline 50 MG tablet  Commonly known as:  ELAVIL  Take 1 tablet (50 mg total) by mouth at bedtime.     aspirin 81 MG EC tablet  Take 81 mg by mouth daily.     docusate sodium 100 MG capsule  Commonly known as:  COLACE  Take 100 mg by mouth daily as needed for mild constipation or moderate constipation.     Fluticasone-Salmeterol 250-50 MCG/DOSE Aepb  Commonly known as:  ADVAIR DISKUS  Inhale 1 puff into the lungs 2 (two) times  daily.     hydroxypropyl methylcellulose / hypromellose 2.5 % ophthalmic solution  Commonly known as:  ISOPTO TEARS / GONIOVISC  Place 2 drops into both eyes 3 (three) times daily as needed. Dry eye     meclizine 12.5 MG tablet  Commonly known as:  ANTIVERT  Take 1 tablet (12.5 mg total) by mouth 2 (two) times daily as needed for dizziness.     multivitamin tablet  Take 1 tablet by mouth daily.     NIFEdipine 90 MG 24 hr tablet  Commonly known as:  ADALAT CC  Take 1 tablet (90 mg total) by mouth daily.     omeprazole 20 MG capsule  Commonly known as:  PRILOSEC  Take 1 capsule (20 mg total) by mouth 2 (two) times daily.     oxyCODONE 5 MG immediate release tablet  Commonly known as:  Oxy IR/ROXICODONE  Take 1 tablet (5 mg total) by mouth every 4 (four) hours as needed for moderate pain, severe pain or breakthrough pain.     piperacillin-tazobactam 3.375 GM/50ML IVPB  Commonly known as:  ZOSYN  Inject 50 mLs (3.375 g total) into the vein every 8 (eight) hours.     RED  YEAST RICE PO  Take 1 tablet by mouth 2 (two) times daily.     rivaroxaban 20 MG Tabs tablet  Commonly known as:  XARELTO  Take 1 tablet (20 mg total) by mouth daily with supper.  Start taking on:  06/10/2015     senna-docusate 8.6-50 MG per tablet  Commonly known as:  Senokot-S  Take 1 tablet by mouth 2 (two) times daily.     sertraline 100 MG tablet  Commonly known as:  ZOLOFT  Take 1 tablet (100 mg total) by mouth 2 (two) times daily.     tiotropium 18 MCG inhalation capsule  Commonly known as:  SPIRIVA HANDIHALER  Place 1 capsule (18 mcg total) into inhaler and inhale daily.     topiramate 50 MG tablet  Commonly known as:  TOPAMAX  Take 1 tablet (50 mg total) by mouth 2 (two) times daily.     vancomycin 1 GM/200ML Soln  Commonly known as:  VANCOCIN  Inject 200 mLs (1,000 mg total) into the vein every 12 (twelve) hours.     Vitamin D3 1000 UNITS Caps  Take 1 capsule (1,000 Units total) by mouth  daily.        Disposition and follow-up:   Ms.Dioselina D [place] was discharged from [facility] in Good condition.  At the hospital follow up visit please address:  1.  S/p Cholecystectomy: Please continue to evaluate abdominal pain, nausea, vomiting. Will need follow up CMP and CBC, although prior to discharge, LFT's close to normal. No further leukocytosis.  Biliary Infection: Patient w/ bile fluid positive for pseudomonas (cipro resistant) and enterococcus. Need to continue Zosyn until 06/17/15, Vancomycin until 06/21/15 per discussion w/ ID (14 day course). Vancomycin levels need to be followed closely, although renal function is good.   Hypokalemia: Significant issue during initial part of admission, likely related to ?cholecystostomy losses. No other clear explanation for this, resolved after ostomy removed. Will need follow up electrolytes and close monitoring. HCTZ discontinued during admission d/t this. May need to be added back if K continues to be normal.   Atrial Fibrillation: Patient on Amiodarone, will need outpatient PFT's. Resume Xarelto 06/10/15.   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 Had previously, started on Xarelto for this. Resume as above.   2.  Labs / imaging needed at time of follow-up: CMP, CBC, magnesium, EKG  3.  Pending labs/ test needing follow-up: None  Follow-up Appointments: Follow-up Information    Follow up with [facility] [place] On 06/24/2015.   Why:  For post-operation check. Your appointment is at 3:45, please arrive at least 30 min before your appointment to complete your check in paperwork.  If you are unable to arrive 30 min prior to your appointment time we may have to cancel or reschedule you.   Contact information:   [place] [address] [phone]      Follow up with [facility].   Why:  Please make an appointment for 1 week after discharge from SNF.    Contact information:   [address] [place] [phone]      Consultations: Treatment Team:  Judeth Horn, MD Md [facility], MD  Procedures Performed:  Dg Cholangiogram Operative  06/06/2015   CLINICAL DATA:  Intraoperative cholangiogram for cholecystitis  EXAM: INTRAOPERATIVE CHOLANGIOGRAM  FLUOROSCOPY TIME:  9 seconds  COMPARISON:  Ultrasound fluoroscopic guided cholecystostomy tube placement - 04/26/2015; cholangiogram via existing cholecystostomy tube -  06/03/2015; 05/25/2015; CT abdomen pelvis -06/02/2025  FINDINGS: Surgical clips overlie the expected location of the neck of the gallbladder.  A cholecystostomy tube overlies expected location of the gallbladder fossa.  Contrast injection fails to opacify any identifiable portion of the biliary system extravasation of contrast about the gallbladder fossa.  IMPRESSION: Nondiagnostic cholangiogram as above. Further evaluation with ERCP could be performed as clinically indicated   Electronically Signed   By: Sandi Mariscal M.D.   On: 06/06/2015 12:01   Ct Abdomen Pelvis W Contrast  06/03/2015   CLINICAL DATA:  Epigastric abdominal pain. History of bloody drainage from cholecystostomy tube.  EXAM: CT ABDOMEN AND PELVIS WITH CONTRAST  TECHNIQUE: Multidetector CT imaging of the abdomen and pelvis was performed using the standard protocol following bolus administration of intravenous contrast.  CONTRAST:  128mL OMNIPAQUE IOHEXOL 300 MG/ML  SOLN  COMPARISON:  Most recent CT 05/27/2015  FINDINGS: Minimal atelectasis at the left lung base.  Percutaneous cholecystostomy tube in place. Do intraluminal hyperdensities, 1 in the gallbladder neck, 1 adjacent to the cholecystostomy tube, may reflect gallstones. There is mild interval distention the gallbladder compared to prior CT. Unchanged degree of extrahepatic biliary prominence, the common bile duct measures 1.4 cm, the contents of the common bile duct appear increased in density compared to  prior. Small fluid collection just anterior to the cholecystostomy tube in the liver is slightly larger than on prior exam, currently 3.3 x 2.5 x 4.4 cm, previously 2.3 x 3.0 x 4.5 cm. There is minimal surrounding soft tissue stranding. There is probable thrombosis of portal venous branches in the left lobe of the liver that appears similar to prior exam.  Fatty atrophy of the pancreas, unchanged from prior. Left adrenal hypertrophy unchanged, right adrenal gland is normal. Spleen is normal. Kidneys demonstrate symmetric enhancement and excretion on delayed phase imaging.  Stomach physiologically distended. There are no dilated or thickened bowel loops. Moderate stool throughout the colon. Diverticulosis of the distal colon without diverticulitis. Sigmoid colon is tortuous. There is no free air or ascites.  Or unchanged aneurysm of the infrarenal abdominal aorta measuring up to 3.7 cm. There is a centric mural thrombus and diffuse atheromatous calcification. No retroperitoneal adenopathy.  Within the pelvis the bladder is physiologically distended. Uterus is surgically absent. Multiple pelvic phleboliths. No pelvic free fluid. No adnexal mass.  Scoliosis and degenerative change throughout the spine. There are no acute or suspicious osseous abnormalities.  IMPRESSION: 1. Cholecystostomy tube remains in place. Unchanged biliary prominence, however the density in the common bile duct is increased compared to prior, which may reflect complex fluid, hemorrhage or infection. Alternatively, vicarious excretion of contrast related to recent prior CT is considered, but fell is likely. 2. Small fluid collection adjacent to the right lobe of the liver has minimally increased in size from prior exam, there is a small amount of surrounding inflammation. Hematoma or biloma again considered. Given the surrounding inflammation, superimposed infection is not excluded. 3. Unchanged appearance of thrombus of the branches of the portal  vein in the left hepatic lobe. 4. Infrarenal abdominal aortic aneurysm. Follow-up ultrasound in 2 years recommended.   Electronically Signed   By: Jeb Levering M.D.   On: 06/03/2015 02:42   Ct Abdomen Pelvis W Contrast  05/27/2015   CLINICAL DATA:  Status post percutaneous cholecystostomy.  EXAM: CT ABDOMEN AND PELVIS WITH CONTRAST  TECHNIQUE: Multidetector CT imaging of the abdomen and pelvis was performed using the standard protocol following bolus administration of intravenous  contrast.  CONTRAST:  100 cc of Omnipaque 300  COMPARISON:  04/25/2015  FINDINGS: Lower chest: Dependent changes are noted within the posterior right lung base.  Hepatobiliary: A percutaneous cholecystostomy tube is identified and appears well suture rated within the gallbladder. The gallbladder appears decompressed. Small fluid collection ventral to the right hepatic lobe measures 2.3 x 3.0 x 4.5 cm, image 37/ series 2. This is mixed attenuation may represent a small biloma and/or hematoma. There is no significant biliary dilatation. 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 The hepatic veins appear patent.  Pancreas: Fatty replacement of the pancreas.  Spleen: Normal appearance of the spleen.  Adrenals/Urinary Tract: Left adrenal gland hypertrophy noted. Right adrenal gland appears normal. Unremarkable appearance of the kidneys. Urinary bladder appears normal.  Stomach/Bowel: The stomach is within normal limits. The small bowel loops have a normal course and caliber. No obstruction. Normal appearance of the colon.  Vascular/Lymphatic: Calcified atherosclerotic disease involves the abdominal aorta. There is an infrarenal abdominal aortic aneurysm which measures 3.7 cm, image 50/series 2. No enlarged retroperitoneal or mesenteric adenopathy. No enlarged pelvic or inguinal lymph nodes.  Reproductive: Previous hysterectomy.  No adnexal mass.  Other:  None.  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  IMPRESSION: 1. Interval percutaneous cholecystostomy with decompression of the gallbladder. There is no evidence for biliary obstruction. 2. Small fluid collection ventral to the right lobe of liver is identified. This is mixed attenuation and may represent a small biloma and/or small hematoma. No large hematoma is identified at this time. 3. 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 4. Aortic atherosclerosis 5. Infrarenal abdominal aortic aneurysm. Recommend followup by ultrasound in 2 years. This recommendation follows ACR consensus guidelines: White Paper of the ACR Incidental Findings Committee II on Vascular Findings. J Am Coll Radiol 2013; 10:789-794. 6. Critical Value/emergent results were called by telephone at the time of interpretation on 05/27/2015 at 7:44 pm to Dr. Carmin Muskrat , who verbally acknowledged these results.   Electronically Signed   By: Kerby Moors M.D.   On: 05/27/2015 19:44   Ir Sinus/fist Tube Chk-non Gi  05/27/2015   CLINICAL DATA:  Hemobilia, status post cholecystostomy, tachycardia, diaphoresis  EXAM: Fluoroscopic injection of the existing cholecystostomy  Date:  6/21/20166/21/2016 3:38 pm  Radiologist:  M. Daryll Brod, MD  Guidance:  Fluoroscopic  FLUOROSCOPY TIME:  54 seconds, 50 mGy  MEDICATIONS AND MEDICAL HISTORY: None.  ANESTHESIA/SEDATION: None.  CONTRAST:  16mL OMNIPAQUE IOHEXOL 300 MG/ML  SOLN  COMPLICATIONS: None immediate  PROCEDURE: Informed consent was obtained from the patient following explanation of the procedure, risks, benefits and alternatives. The patient understands, agrees and consents for the procedure. All questions were addressed. A time out was performed.  Under sterile conditions, the existing cholecystostomy was injected with contrast under fluoroscopy. Images obtained  for documentation.  Cholangiogram: Stable position of the cholecystostomy tube within the gallbladder. Filling defects throughout the gallbladder could represent blood clots and/or stones. Cystic duct is patent. Contrast does reach the common hepatic duct. No communication to be any adjacent vascular structure to account for the hemobilia.  IMPRESSION: Cholecystostomy in good position.  Patent cystic duct.  Gallbladder diffuse filling defect compatible with blood clot versus cholelithiasis.   Electronically Signed   By: Jerilynn Mages.  Shick  M.D.   On: 05/27/2015 16:26   Ir Cm Inj Any Colonic Tube W/fluoro  06/03/2015   CLINICAL DATA:  History of acute cholecystitis, post ultrasound fluoroscopic guided cholecystostomy tube placement on 04/26/2015.  Patient has been anticoagulated for concern of portal vein thrombosis involving the distal aspect of the intrahepatic portal venous system of the left lobe of the liver.  Since cholecystostomy tube placement, the patient has had persistent bloody output for which she underwent a fluoroscopic guided cholangiogram performed 05/25/19 16 which demonstrated multiple blood products within the gallbladder fossa.  Patient now with elevated LFTs with bloody output surrounding the cholecystostomy tube. Request made for repeat fluoroscopic guided cholangiogram peer  EXAM: GI TUBE INJECTION  COMPARISON:  CT abdomen pelvis - 06/03/2015; 05/27/2015; 04/25/2015; ultrasound fluoroscopic guided cholecystostomy tube placement - 04/26/2015; cholangiogram via existing cholecystostomy tube -05/25/2015  CONTRAST:  56mL OMNIPAQUE IOHEXOL 300 MG/ML SOLN - administered via the existing cholecystostomy tube  FLUOROSCOPY TIME:  1 minutes, 18 seconds (92 mGy)  TECHNIQUE: The patient was positioned supine on the fluoroscopy table. A preprocedural spot fluoroscopic image was obtained of the right upper abdominal quadrant an existing percutaneous cholecystostomy tube.  Multiple spot fluoroscopic and radiographic  images were obtained of the right upper abdominal quadrant following the injection of small amount of contrast via the existing cholecystostomy tube.  Images were reviewed and the procedure was terminated. Note, patient became nauseous following the injection of a small amount of contrast resulting in significant image degradation.  The cholecystostomy tube was secured in place with a new Stat Lock device.  The cholecystostomy tube was flushed with a small amount of saline and reconnected to the gravity bag.  FINDINGS: Preprocedural spot fluoroscopic image demonstrates unchanged positioning of the cholecystostomy tube overlying the right upper abdominal quadrant.  Contrast injection demonstrates near complete replacement of the gallbladder lumen with ill-defined filling defects favored to represent blood products.  There is minimal opacification of the cystic duct which opacifies the common bile and hepatic ducts which likewise R field with occlusive debris favored to represent blood products. Note, the patient became nauseous following the injection of contrast via the cholecystostomy tube, resulting in significant image degradation.  IMPRESSION: 1. Appropriately positioned cholecystostomy tube with end coiled and locked within the gallbladder fundus. No exchange performed. 2. Percutaneous cholangiogram demonstrates complete opacification of the gallbladder lumen as well as the opacified portions of the cystic, common hepatic and common bile ducts with ill-defined filling defects favored to represent blood products.  PLAN: Given the very minimal amount of residual largely nonocclusive thrombus within the central aspect of the left intrahepatic biliary system, would advise against continued aggressive anti coagulation.  If patient is liver function tests continued to rise, would consider ERCP for potential temporizing biliary stent placement.  If patient continues to experience bloody drainage surrounding the  cholecystostomy tube, this could be up sized to a 12 [place] percutaneous drain, however would advise this procedure be performed with sedation secondary to patient's inability to tolerate today's percutaneous cholangiogram.  Above findings discussed with Randon Goldsmith, [facility] PA, at the time of procedure completion.   Electronically Signed   By: Sandi Mariscal M.D.   On: 06/03/2015 17:08   Dg Chest Port 1 View  05/27/2015   CLINICAL DATA:  Abdominal pain and tachycardia.  EXAM: PORTABLE CHEST - 1 VIEW  COMPARISON:  05/25/2015  FINDINGS: Portable view of the chest demonstrates low lung volumes. Difficult to exclude vascular congestion. Heart size is accentuated by the low  lung volumes. Negative for pneumothorax.  IMPRESSION: Low lung volumes without focal disease. Difficult to exclude vascular congestion.   Electronically Signed   By: Markus Daft M.D.   On: 05/27/2015 17:54   Dg Chest Port 1 View  05/25/2015   CLINICAL DATA:  Acute chest pain.  EXAM: PORTABLE CHEST - 1 VIEW  COMPARISON:  Apr 25, 2015.  FINDINGS: Stable cardiomediastinal silhouette. No pneumothorax or pleural effusion is noted. Both lungs are clear. The visualized skeletal structures are unremarkable.  IMPRESSION: No acute cardiopulmonary abnormality seen.   Electronically Signed   By: Marijo Conception, M.D.   On: 05/25/2015 10:20   Ir Cholangiogram Existing Tube  05/27/2015   CLINICAL DATA:  Hemobilia. Examination of the drainage to for the cholecystostomy tube demonstrates predominately serosanguineous fluid and a few scattered very dark gold blood clots. There is no bright red blood in the drainage bag.  EXAM: CHOLANGIOGRAM VIA EXISTING CATHETER  PROCEDURE: Contrast was injected into the cholecystostomy tube and imaging was obtained.  FINDINGS: There are filling defects in the gallbladder and cystic duct compatible with thrombus. The cystic duct is patent.  IMPRESSION: The cystic duct is patent. Blood clots are present in the gallbladder. Recommendations  are for drain capping and resuming bag drainage tomorrow.   Electronically Signed   By: Marybelle Killings M.D.   On: 05/27/2015 07:51    Admission HPI: Pt is a 74 y/o F w/ PMHx of HTN, CHF, COPD, fibromyalgia, afib w/ RVR on xarelto, cholecystostomy tube placement on 5/20 for acute cholecystitis and recent admission for acute portal vein thrombosis who presents with abd pain and bloody drainage from cholecystostomy tube. Pt was recently discharged on 6/24 after she was admitted for the same reason and found to have a portal vein thrombosis. She was also found to be in afib w/ RVR and started on amiodarone and xarelto. On 6/27 pt developed abdominal pain and started noticing bloody drainage in her bag. Pt states that abd pain is 8/10 in severity and feels like a dull pain. She compares it to how she felt when she had her acute cholecystitis.She has not had a bowel movement since discharge on Friday and feels like she has gas. Denies fevers. She felt nauseous this morning and had one episode of vomiting while at [facility] ED. She then went to the [facility] ED at which time CT of abd/pelvis revealed a small fluid collection adjacent to the rt lobe of the liver w/ small amt of surround inflammation that could be a hematoma vs biloma vs superimposed infection. Surgery was contacted and recommended starting zosyn, and they will come evaluate her today.   Hospital Course by problem list:  1. Cholecystitis s/p cholecystostomy and cholecystectomy- Patient w/ previous admission in 04/2015 for cholecystitis, infection quite severe at that time, gallbladder quite thickened, surgery did not want to perform cholecystectomy at that time d/t severity of illness. Cholecystostomy placed for drainage. Patient then returned w/ portal vein thrombosis, started on Xarelto (also to cover for new onset atrial fibrillation). Returned on 06/02/15 for continued bleeding from cholecystostomy as well as continued leukocytosis and elevated LFT's. Taken to  the OR on 06/06/15 for cholecystectomy, slightly delayed d/t significant hypokalemia of 2.2-2.4. No apparent complications. LFT's trended down (still note quite normal prior to discharge), leukocytosis resolved. Bile cultures positive for Pseudomonas and Enterococcus, covered w/ Vancomycin + Zosyn as discussed below.   2. Biliary Infection- Bile fluid positive for Pseudomonas (resistant to Cipro) and Enterococcus. Started  on Zosyn on 06/03/15 (end date 06/17/15) and Vancomycin started on 06/07/15 (end date 06/21/15). Enterococcus culture not positive until later. These will need to be continued at SNF for total of 14 days per discussion w/ ID for adequate clearance of bile infection.   3. Portal Vein Thrombosis- Discovered on previous admission, started on Xarelto for this as well as a-fib. Likely contributed to increasing LFT's. Patient then with continued bleeding into cholecystostomy, Xarelto stopped. LFT's continued to improve prior to discharge.   4. Atrial Fibrillation- Had RVR initially on previous admission, placed on Amiodarone per cardiology, likely related to acute illness. Placed on Xarelto, however, this was stopped d/t complications as above. Continued on Amiodarone. NSR during this admission per telemetry. RESTART XARELTO 06/10/15 per surgery.   5. Hypokalemia- Patient w/ significant hypokalemia during admission, actually delayed surgery, as low as 2.2, most likely related to cholecystostomy, as continued hypokalemia resolved s/p cholecystectomy. No other obvious etiology for this. HCTZ also held given hypokalemia. Can likely be restarted on discharge if K found to be normal on repeat. Daily K supplementation discontinued on 06/08/15.   6. HTN- Patient continued on home Nifedipine, HCTZ held in the setting of hypokalemia. BP only mildly elevated at times.   7. COPD- Continued on home inhalers.   Discharge Vitals:   BP 122/63 mmHg  Pulse 67  Temp(Src) 98.2 F (36.8 C) (Oral)  Resp 17  Ht 5\' 3"   (1.6 m)  Wt 235 lb 10.8 oz (106.9 kg)  BMI 41.76 kg/m2  SpO2 98%  Discharge Labs:  Results for orders placed or performed during the hospital encounter of 06/02/15 (from the past 24 hour(s))  CBC     Status: Abnormal   Collection Time: 06/09/15  3:41 AM  Result Value Ref Range   WBC 8.9 4.0 - 10.5 K/uL   RBC 2.55 (L) 3.87 - 5.11 MIL/uL   Hemoglobin 8.4 (L) 12.0 - 15.0 g/dL   HCT 25.5 (L) 36.0 - 46.0 %   MCV 100.0 78.0 - 100.0 fL   MCH 32.9 26.0 - 34.0 pg   MCHC 32.9 30.0 - 36.0 g/dL   RDW 14.4 11.5 - 15.5 %   Platelets 350 150 - 400 K/uL  Comprehensive metabolic panel     Status: Abnormal   Collection Time: 06/09/15  3:41 AM  Result Value Ref Range   Sodium 134 (L) 135 - 145 mmol/L   Potassium 4.0 3.5 - 5.1 mmol/L   Chloride 102 101 - 111 mmol/L   CO2 22 22 - 32 mmol/L   Glucose, Bld 105 (H) 65 - 99 mg/dL   BUN <5 (L) 6 - 20 mg/dL   Creatinine, Ser 0.62 0.44 - 1.00 mg/dL   Calcium 8.6 (L) 8.9 - 10.3 mg/dL   Total Protein 5.7 (L) 6.5 - 8.1 g/dL   Albumin 2.1 (L) 3.5 - 5.0 g/dL   AST 29 15 - 41 U/L   ALT 64 (H) 14 - 54 U/L   Alkaline Phosphatase 296 (H) 38 - 126 U/L   Total Bilirubin 0.9 0.3 - 1.2 mg/dL   GFR calc non Af Amer >60 >60 mL/min   GFR calc Af Amer >60 >60 mL/min   Anion gap 10 5 - 15    Signed: Corky Sox, MD 06/09/2015, 2:14 PM    Services Ordered on Discharge: SNF Equipment Ordered on Discharge: None

## 2015-06-09 NOTE — Progress Notes (Signed)
Subjective: Patient was seen and examined at bedside today. Vitals are stable, no fever. Patient states she is feeling much better and denies having any abdominal pain or dizziness. Denies CP, SOB, nausea, vomiting, or diarrhea. LFTs trending down.   Objective: Vital signs in last 24 hours: Filed Vitals:   06/08/15 2014 06/08/15 2040 06/09/15 0532 06/09/15 1334  BP:  130/69 143/71 122/63  Pulse:  80 74 67  Temp:  99.6 F (37.6 C) 98.2 F (36.8 C) 98.2 F (36.8 C)  TempSrc:  Oral Axillary Oral  Resp:  18 16 17   Height:      Weight:      SpO2: 95% 96% 99% 98%   Weight change:   Intake/Output Summary (Last 24 hours) at 06/09/15 1409 Last data filed at 06/09/15 1335  Gross per 24 hour  Intake    763 ml  Output    295 ml  Net    468 ml   Physical Exam  General: resting comfortably in bed, NAD HEENT: PERRL, EOMI, no scleral icterus Cardiac: RRR, no rubs, murmurs or gallops Pulm: clear to auscultation bilaterally, no wheezes, rales, or rhonchi Abd: soft, non tender, non distended, +BS. JP drain contains <5cc of serosanginous fluid. Site of tube placement covered with gauze.  Ext: warm and well perfused, no pedal edema Neuro: alert and oriented X3, cranial nerves II-XII grossly intact, motor strength grossly intact, sensation to light touch intact  Lab Results: Basic Metabolic Panel:  Recent Labs Lab 06/06/15 0737  06/08/15 0426 06/09/15 0341  NA 131*  < > 133* 134*  K 3.4*  < > 4.0 4.0  CL 97*  < > 103 102  CO2 24  < > 24 22  GLUCOSE 97  < > 109* 105*  BUN 16  < > 6 <5*  CREATININE 0.71  < > 0.53 0.62  CALCIUM 8.8*  < > 8.8* 8.6*  MG 2.0  --  1.8  --   < > = values in this interval not displayed. Liver Function Tests:  Recent Labs Lab 06/08/15 0426 06/09/15 0341  AST 38 29  ALT 84* 64*  ALKPHOS 342* 296*  BILITOT 1.2 0.9  PROT 5.3* 5.7*  ALBUMIN 1.9* 2.1*    Recent Labs Lab 06/02/15 2351 06/04/15 1329  LIPASE 11* 11*   CBC:  Recent  Labs Lab 06/02/15 2351  06/08/15 0426 06/09/15 0341  WBC 15.4*  < > 11.6* 8.9  NEUTROABS 12.2*  --   --   --   HGB 11.0*  < > 8.1* 8.4*  HCT 33.8*  < > 25.2* 25.5*  MCV 99.1  < > 98.1 100.0  PLT 412*  < > 384 350  < > = values in this interval not displayed.  Urinalysis:  Recent Labs Lab 06/03/15 0027  COLORURINE YELLOW  LABSPEC 1.010  PHURINE 7.0  GLUCOSEU NEGATIVE  HGBUR NEGATIVE  BILIRUBINUR NEGATIVE  KETONESUR NEGATIVE  PROTEINUR NEGATIVE  UROBILINOGEN 1.0  NITRITE NEGATIVE  LEUKOCYTESUR TRACE*    Micro Results: Recent Results (from the past 240 hour(s))  Culture, body fluid-bottle     Status: None (Preliminary result)   Collection Time: 06/03/15  3:36 AM  Result Value Ref Range Status   Specimen Description BILE  Final   Special Requests NONE  Final   Gram Stain   Final    GRAM NEGATIVE RODS IN BOTH AEROBIC AND ANAEROBIC BOTTLES GRAM STAIN REVIEWED-AGREE WITH RESULT MVESTAL GRAM POSITIVE COCCI IN CHAINS ANAEROBIC BOTTLE ONLY  Culture PSEUDOMONAS AERUGINOSA ENTEROCOCCUS SPECIES   Final   Report Status PENDING  Incomplete   Organism ID, Bacteria PSEUDOMONAS AERUGINOSA  Final   Organism ID, Bacteria ENTEROCOCCUS SPECIES  Final      Susceptibility   Pseudomonas aeruginosa - MIC*    CEFTAZIDIME 4 SENSITIVE Sensitive     CIPROFLOXACIN >=4 RESISTANT Resistant     GENTAMICIN <=1 SENSITIVE Sensitive     IMIPENEM 2 SENSITIVE Sensitive     PIP/TAZO 8 SENSITIVE Sensitive     CEFEPIME 2 SENSITIVE Sensitive     * PSEUDOMONAS AERUGINOSA   Enterococcus species - MIC*    AMPICILLIN >=32 RESISTANT Resistant     VANCOMYCIN <=0.5 SENSITIVE Sensitive     GENTAMICIN SYNERGY SENSITIVE Sensitive     * ENTEROCOCCUS SPECIES  Gram stain     Status: None   Collection Time: 06/03/15  3:36 AM  Result Value Ref Range Status   Specimen Description BILE  Final   Special Requests NONE  Final   Gram Stain   Final    FEW WBC PRESENT, PREDOMINANTLY PMN MODERATE GRAM NEGATIVE  RODS Gram Stain Report Called to,Read Back By and Verified With: T SPENCER,RN AT 1300 06/03/15 BY K BARR    Report Status 06/03/2015 FINAL  Final   Studies/Results: No results found. Medications: I have reviewed the patient's current medications. Scheduled Meds: . acetaminophen  650 mg Oral QID  . amiodarone  200 mg Oral BID  . amitriptyline  50 mg Oral QHS  . bisacodyl  10 mg Rectal Daily  . enoxaparin (LOVENOX) injection  40 mg Subcutaneous Q24H  . mometasone-formoterol  2 puff Inhalation BID  . NIFEdipine  90 mg Oral Daily  . pantoprazole  40 mg Oral Daily  . piperacillin-tazobactam (ZOSYN)  IV  3.375 g Intravenous 3 times per day  . senna-docusate  1 tablet Oral BID  . sertraline  100 mg Oral BID  . sodium chloride  3 mL Intravenous Q12H  . tiotropium  18 mcg Inhalation Daily  . topiramate  50 mg Oral BID  . vancomycin  1,000 mg Intravenous Q12H   Continuous Infusions: . lactated ringers 20 mL/hr at 06/08/15 1615   PRN Meds:.albuterol, ALPRAZolam, meclizine, methocarbamol, morphine injection, oxyCODONE, promethazine, sodium phosphate Assessment/Plan:  74 y/o F with a PMHx of anemia, GERD, PAF, depression, and anxiety admitted for cholecystitis with bleeding from cholecystostomy site, possible portal vein thrombosis with worsening LFTs, biliary pseudomonas and enterococcus infection. Patient underwent cholecystectomy on 06/06/15 and currently has a JP drain in place.   Cholecystitis s/p cholecystectomy - post op day #3. Patient doing well, JP drain in place with minimal serosanguinous drainage <5cc.Marland Kitchen LFTs and WBCs trending down. Patient still complaining of mild diffuse abdominal tenderness. Patient's bile culture grew pseudomonas and enteroccous.  - As per pharmacy recommendation, continue Zosyn for pseudomonas and Vancomycin for enterococcus. - Continue pain mgmt - PT on board for ambulation  - As per surgery, patient is stable and possible D/C today. JP drain removed by  surgery later in the day.   Portal vein thrombosis  - Continue Lovenox   Afib with RVR -l Patient was on Amiodarone and Xarelto. However, in the setting of surgery and cholecystotomy bleeding, we held Xarelto - cont Amiodarone  - Patient stable now, no active bleeding. Xarelto can be restarted tomorrow. Until then, continue Lovenox.   Hypokalemia - resolved  Likely 2/2 to fluid losses via biliary tract. Patient's K has trended up since the surgery, 3.4 -->  4.0.  - Continue K-dur 40 mEq QD    HTN - BP stable. Patient on HCTZ 12.5mg  daily and nifedipine 90mg  daily at home.  - hold HCTZ b/c of hypokalemia. Cont nifedipine.   CHF - stable. Patient does not any SOB or edema. Last TTE 04/2015 w/ EF 55-60% with grade 1 diastolic dysfunction  COPD-- stable -cont dulera1 puff bid, spiriva 18 mcg inh daily, and albuterol 1-2 puff q6hprn  Fibromyalgia- at home she takes norco 7.5-325mg  QID prn, amitriptyline 50mg  qhs, carisoprodol 350mg  qd prn - pain management as above  OSA- stable - cont CPAP qhs  Anxiety and depression-- cont home zoloft 100mg  BID, elavil 50mg  qhs, and xanax 0.5mg  TID prn  Migraines- cont home med Topamax 50mg  BID  Pre-diabetes -hgba1c 6.0 - lifestyle mod.  DVT ppx - SCDs and Lovenox  Diet: full liquids  Code: full.   Dispo: Disposition is deferred at this time, awaiting improvement of current medical problems.  Anticipated discharge in approximately 0 day(s).   The patient does have a current PCP Bertha Stakes, MD) and does need an [facility] hospital follow-up appointment after discharge.  The patient does not have transportation limitations that hinder transportation to clinic appointments.  .Services Needed at time of discharge: Y = Yes, Blank = No PT:   OT:   RN:   Equipment:   Other:     LOS: 6 days   Shela Leff, MD 06/09/2015, 2:09 PM

## 2015-06-09 NOTE — Progress Notes (Signed)
RT entered room to place patient on CPAP and patient stated she places herself on and off when ready. RT informed patient if she needed any help have RN contact RT.

## 2015-06-10 ENCOUNTER — Encounter (HOSPITAL_COMMUNITY): Payer: Self-pay | Admitting: Surgery

## 2015-06-10 DIAGNOSIS — Z9889 Other specified postprocedural states: Secondary | ICD-10-CM

## 2015-06-10 DIAGNOSIS — K810 Acute cholecystitis: Secondary | ICD-10-CM

## 2015-06-10 DIAGNOSIS — Z9049 Acquired absence of other specified parts of digestive tract: Secondary | ICD-10-CM

## 2015-06-10 MED ORDER — SODIUM CHLORIDE 0.9 % IJ SOLN
10.0000 mL | INTRAMUSCULAR | Status: DC | PRN
  Administered 2015-06-10: 10 mL
  Filled 2015-06-10: qty 40

## 2015-06-10 MED ORDER — RIVAROXABAN 20 MG PO TABS: 20.0000 mg | ORAL_TABLET | Freq: Every day | ORAL | Status: DC

## 2015-06-10 MED ORDER — HEPARIN SOD (PORK) LOCK FLUSH 100 UNIT/ML IV SOLN
250.0000 [IU] | INTRAVENOUS | Status: AC | PRN
  Administered 2015-06-10: 250 [IU]

## 2015-06-10 MED ORDER — RIVAROXABAN 15 MG PO TABS: 15.0000 mg | ORAL_TABLET | Freq: Two times a day (BID) | ORAL | Status: DC

## 2015-06-10 MED ORDER — RIVAROXABAN 15 MG PO TABS
15.0000 mg | ORAL_TABLET | Freq: Two times a day (BID) | ORAL | Status: DC
  Administered 2015-06-10: 15 mg via ORAL
  Filled 2015-06-10: qty 1

## 2015-06-10 NOTE — Progress Notes (Signed)
ANTIBIOTIC CONSULT NOTE - FOLLOW UP  Pharmacy Consult:  Vancomycin Indication:  Intra-abdominal infection  Allergies  Allergen Reactions  . Tetracycline Other (See Comments)    REACTION: stomach upset  . Flonase [Fluticasone] Cough  . Lisinopril Cough    Patient Measurements: Height: 5\' 3"  (160 cm) Weight: 235 lb 10.8 oz (106.9 kg) IBW/kg (Calculated) : 52.4  Vital Signs: Temp: 98.4 F (36.9 C) (07/05 0514) Temp Source: Oral (07/05 0514) BP: 124/66 mmHg (07/05 0514) Pulse Rate: 68 (07/05 0514) Intake/Output from previous day: 07/04 0701 - 07/05 0700 In: 540 [P.O.:540] Out: 630 [Urine:600; Drains:30]  Labs:  Recent Labs  06/08/15 0426 06/09/15 0341  WBC 11.6* 8.9  HGB 8.1* 8.4*  PLT 384 350  CREATININE 0.53 0.62   Estimated Creatinine Clearance: 70.1 mL/min (by C-G formula based on Cr of 0.62). No results for input(s): VANCOTROUGH, VANCOPEAK, VANCORANDOM, GENTTROUGH, GENTPEAK, GENTRANDOM, TOBRATROUGH, TOBRAPEAK, TOBRARND, AMIKACINPEAK, AMIKACINTROU, AMIKACIN in the last 72 hours.   Microbiology: Recent Results (from the past 720 hour(s))  MRSA PCR Screening     Status: Abnormal   Collection Time: 05/27/15 12:59 AM  Result Value Ref Range Status   MRSA by PCR POSITIVE (A) NEGATIVE Final    Comment:        The GeneXpert MRSA Assay (FDA approved for NASAL specimens only), is one component of a comprehensive MRSA colonization surveillance program. It is not intended to diagnose MRSA infection nor to guide or monitor treatment for MRSA infections. RESULT CALLED TO, READ BACK BY AND VERIFIED WITH: W.WORK RN [phone] 05/28/15 E.GADDY   Blood Culture (routine x 2)     Status: None   Collection Time: 05/27/15  5:55 PM  Result Value Ref Range Status   Specimen Description BLOOD RIGHT HAND  Final   Special Requests BOTTLES DRAWN AEROBIC AND ANAEROBIC 5CC  Final   Culture NO GROWTH 5 DAYS  Final   Report Status 06/01/2015 FINAL  Final  Blood Culture (routine x 2)      Status: None   Collection Time: 05/27/15  5:58 PM  Result Value Ref Range Status   Specimen Description BLOOD LEFT HAND  Final   Special Requests BOTTLES DRAWN AEROBIC ONLY 2CC  Final   Culture NO GROWTH 5 DAYS  Final   Report Status 06/01/2015 FINAL  Final  Culture, body fluid-bottle     Status: None (Preliminary result)   Collection Time: 06/03/15  3:36 AM  Result Value Ref Range Status   Specimen Description BILE  Final   Special Requests NONE  Final   Gram Stain   Final    GRAM NEGATIVE RODS IN BOTH AEROBIC AND ANAEROBIC BOTTLES GRAM STAIN REVIEWED-AGREE WITH RESULT MVESTAL GRAM POSITIVE COCCI IN CHAINS ANAEROBIC BOTTLE ONLY    Culture PSEUDOMONAS AERUGINOSA ENTEROCOCCUS SPECIES   Final   Report Status PENDING  Incomplete   Organism ID, Bacteria PSEUDOMONAS AERUGINOSA  Final   Organism ID, Bacteria ENTEROCOCCUS SPECIES  Final      Susceptibility   Pseudomonas aeruginosa - MIC*    CEFTAZIDIME 4 SENSITIVE Sensitive     CIPROFLOXACIN >=4 RESISTANT Resistant     GENTAMICIN <=1 SENSITIVE Sensitive     IMIPENEM 2 SENSITIVE Sensitive     PIP/TAZO 8 SENSITIVE Sensitive     CEFEPIME 2 SENSITIVE Sensitive     * PSEUDOMONAS AERUGINOSA   Enterococcus species - MIC*    AMPICILLIN >=32 RESISTANT Resistant     VANCOMYCIN <=0.5 SENSITIVE Sensitive     GENTAMICIN SYNERGY  SENSITIVE Sensitive     * ENTEROCOCCUS SPECIES  Gram stain     Status: None   Collection Time: 06/03/15  3:36 AM  Result Value Ref Range Status   Specimen Description BILE  Final   Special Requests NONE  Final   Gram Stain   Final    FEW WBC PRESENT, PREDOMINANTLY PMN MODERATE GRAM NEGATIVE RODS Gram Stain Report Called to,Read Back By and Verified With: T SPENCER,RN AT 1300 06/03/15 BY K BARR    Report Status 06/03/2015 FINAL  Final      Assessment: 40 YOF to continue on vancomycin for Enteroccal and Zosyn for Pseudomonal intra-abdominal infection.  Patient's renal function has been stable.  Vanc 7/2  >> Zosyn 6/28 >>  6/28 bile cx - P.aeruginosa (sensitive to [employer], Primaxin, Zosyn) + Enterococcus (sensitive to vanc) **lab called and both lvq and cipro resistant; sensitive to tygecycline   Goal of Therapy:  Vancomycin trough level 10-15 mcg/ml    Plan:  - Continue vanc 1gm IV Q12H - Continue Zosyn 3.375gm IV Q8H, 4 hr infusion.  Consider narrowing to [place]. - Monitor renal fxn, clinical progress, vanc trough tomorrow if continues on vanc - F/U resume Xarelto when possible   Dashea Mcmullan D. Mina Marble, PharmD, BCPS Pager:  [phone] 06/10/2015, 7:28 AM

## 2015-06-10 NOTE — Progress Notes (Signed)
[facility] Progress Note  4 Days Post-Op  Subjective: Feels good, tolerating diet, a little anxious.  Drain removed.  No other complaints.    Objective: Vital signs in last 24 hours: Temp:  [98 F (36.7 C)-98.4 F (36.9 C)] 98.4 F (36.9 C) (07/05 0514) Pulse Rate:  [67-68] 68 (07/05 0514) Resp:  [17-19] 19 (07/05 0514) BP: (107-124)/(63-87) 124/66 mmHg (07/05 0514) SpO2:  [98 %-99 %] 99 % (07/05 0514) Last BM Date: 06/08/15  Intake/Output from previous day: 07/04 0701 - 07/05 0700 In: 540 [P.O.:540] Out: 630 [Urine:600; Drains:30] Intake/Output this shift:    PE: Gen:  Alert, NAD, pleasant Abd: Soft, NT/ND, +BS, no HSM, incisions C/D/I, drain removed   Lab Results:   Recent Labs  06/08/15 0426 06/09/15 0341  WBC 11.6* 8.9  HGB 8.1* 8.4*  HCT 25.2* 25.5*  PLT 384 350   BMET  Recent Labs  06/08/15 0426 06/09/15 0341  NA 133* 134*  K 4.0 4.0  CL 103 102  CO2 24 22  GLUCOSE 109* 105*  BUN 6 <5*  CREATININE 0.53 0.62  CALCIUM 8.8* 8.6*   PT/INR No results for input(s): LABPROT, INR in the last 72 hours. CMP     Component Value Date/Time   NA 134* 06/09/2015 0341   K 4.0 06/09/2015 0341   CL 102 06/09/2015 0341   CO2 22 06/09/2015 0341   GLUCOSE 105* 06/09/2015 0341   BUN <5* 06/09/2015 0341   CREATININE 0.62 06/09/2015 0341   CREATININE 0.61 12/12/2014 1228   CALCIUM 8.6* 06/09/2015 0341   PROT 5.7* 06/09/2015 0341   ALBUMIN 2.1* 06/09/2015 0341   AST 29 06/09/2015 0341   ALT 64* 06/09/2015 0341   ALKPHOS 296* 06/09/2015 0341   BILITOT 0.9 06/09/2015 0341   GFRNONAA >60 06/09/2015 0341   GFRNONAA 89 12/12/2014 1228   GFRAA >60 06/09/2015 0341   GFRAA >89 12/12/2014 1228   Lipase     Component Value Date/Time   LIPASE 11* 06/04/2015 1329       Studies/Results: No results found.  Anti-infectives: Anti-infectives    Start     Dose/Rate Route Frequency Ordered Stop   06/09/15 0000  piperacillin-tazobactam (ZOSYN)  3.375 GM/50ML IVPB     3.375 g 12.5 mL/hr over 240 Minutes Intravenous Every 8 hours 06/09/15 1241     06/09/15 0000  vancomycin (VANCOCIN) 1 GM/200ML SOLN     1,000 mg 200 mL/hr over 60 Minutes Intravenous Every 12 hours 06/09/15 1241     06/07/15 1800  vancomycin (VANCOCIN) IVPB 1000 mg/200 mL premix     1,000 mg 200 mL/hr over 60 Minutes Intravenous Every 12 hours 06/07/15 1511     06/03/15 1200  piperacillin-tazobactam (ZOSYN) IVPB 3.375 g     3.375 g 12.5 mL/hr over 240 Minutes Intravenous 3 times per day 06/03/15 0700     06/03/15 0315  piperacillin-tazobactam (ZOSYN) IVPB 3.375 g     3.375 g 100 mL/hr over 30 Minutes Intravenous  Once 06/03/15 0313 06/03/15 0428       Assessment/Plan H/o Acute cholecystitis with calculus - s/p perc chole tube 04/25/15, POD #4 s/p lap chole with IOC 06/06/15 - Dr. Georgette Dover CBD obstruction from blood clot Hemobilia Transaminitis  Elevated Alk Phos  Leukocytosis  Elevated bilirubin  ?Left hepatic vein/PV thrombosis AFIB on Xarelto -Xarelto could be resumed, Cont SQ lovenox, SCD's -IVF, pain control, antiemetics -Tolerating low fat diet well -Oral pain meds -Ambulate and IS -Drain discontinued yesterday -Bile fluid  culture grew pseudomonas aeruginosa and enterococcus - cipro is resistant, Zosyn Day 7/7, Vancomycin Day #4, needs vanc IV for enterococcus for a total of 7 days -D/c to SNF from our perspective -Follow up with [facility] clinic on July 19th at 3:45pm, arrive 30 min early    LOS: 7 days    Jennifer [name] 06/10/2015, 9:10 AM Pager: [phone]

## 2015-06-10 NOTE — Progress Notes (Signed)
PT Cancellation Note  Patient Details Name: Jennifer [name] MRN: [id number] DOB: [birth year]   Cancelled Treatment:    Reason Eval/Treat Not Completed: Other (comment) Rn requesting to hold therapy services as IV team on their way to put in PICC line as pt to d/c today. Will follow up next available time.   Marguarite Arbour A Emberlin Verner 06/10/2015, 11:24 AM [name], PT, DPT [phone]

## 2015-06-10 NOTE — Progress Notes (Signed)
Peripherally Inserted Central Catheter/Midline Placement  The IV Nurse has discussed with the patient and/or persons authorized to consent for the patient, the purpose of this procedure and the potential benefits and risks involved with this procedure.  The benefits include less needle sticks, lab draws from the catheter and patient may be discharged home with the catheter.  Risks include, but not limited to, infection, bleeding, blood clot (thrombus formation), and puncture of an artery; nerve damage and irregular heat beat.  Alternatives to this procedure were also discussed.  PICC/Midline Placement Documentation  PICC / Midline Single Lumen 23/30/07 PICC Right Basilic 43 cm 2 cm (Active)  Indication for Insertion or Continuance of Line Home intravenous therapies (PICC only) 06/10/2015 12:00 PM  Exposed Catheter (cm) 2 cm 06/10/2015 12:00 PM  Dressing Change Due 06/17/15 06/10/2015 12:00 PM       Jule Economy Horton 06/10/2015, 12:07 PM

## 2015-06-10 NOTE — Care Management (Signed)
Important Message  Patient Details  Name: Jennifer [name] MRN: [id number] Date of Birth: Sep 07, 1941   Medicare Important Message Given:  Yes-second notification given    Loann Quill 06/10/2015, 2:44 PM

## 2015-06-10 NOTE — Discharge Summary (Signed)
Name: Jennifer [name] MRN: [id number] DOB: 11-29-1941 74 y.o. PCP: Bertha Stakes, MD  Date of Admission: 06/02/2015  9:30 PM Date of Discharge: 06/10/2015 Attending Physician: Madilyn Fireman, MD  Discharge Diagnosis: 1. Cholecystitis s/p cholecystostomy and cholecystectomy 2. Biliary Infection 3. Atrial Fibrillation 4. Hypokalemia 5. HTN 6. COPD  Discharge Medications:   Medication List    STOP taking these medications        carisoprodol 350 MG tablet  Commonly known as:  SOMA     CINNAMON PO     dextromethorphan-guaiFENesin 30-600 MG per 12 hr tablet  Commonly known as:  MUCINEX DM     hydrochlorothiazide 25 MG tablet  Commonly known as:  HYDRODIURIL     HYDROcodone-acetaminophen 5-325 MG per tablet  Commonly known as:  NORCO/VICODIN     ibuprofen 200 MG tablet  Commonly known as:  ADVIL,MOTRIN     mupirocin ointment 2 %  Commonly known as:  BACTROBAN     potassium chloride SA 20 MEQ tablet  Commonly known as:  K-DUR,KLOR-CON     ranitidine 150 MG tablet  Commonly known as:  ZANTAC      TAKE these medications        albuterol 108 (90 BASE) MCG/ACT inhaler  Commonly known as:  PROAIR HFA  INHALE 2 PUFFS BY MOUTH INTO LUNGS FOUR TIMES DAILY AS NEEDED     ALPRAZolam 0.5 MG tablet  Commonly known as:  XANAX  Take 1 tablet (0.5 mg total) by mouth 3 (three) times daily as needed for anxiety.     amiodarone 200 MG tablet  Commonly known as:  PACERONE  Take 1 tablet (200 mg total) by mouth 2 (two) times daily.     amitriptyline 50 MG tablet  Commonly known as:  ELAVIL  Take 1 tablet (50 mg total) by mouth at bedtime.     aspirin 81 MG EC tablet  Take 81 mg by mouth daily.     docusate sodium 100 MG capsule  Commonly known as:  COLACE  Take 100 mg by mouth daily as needed for mild constipation or moderate constipation.     Fluticasone-Salmeterol 250-50 MCG/DOSE Aepb  Commonly known as:  ADVAIR DISKUS  Inhale 1 puff into the lungs 2 (two) times daily.      hydroxypropyl methylcellulose / hypromellose 2.5 % ophthalmic solution  Commonly known as:  ISOPTO TEARS / GONIOVISC  Place 2 drops into both eyes 3 (three) times daily as needed. Dry eye     meclizine 12.5 MG tablet  Commonly known as:  ANTIVERT  Take 1 tablet (12.5 mg total) by mouth 2 (two) times daily as needed for dizziness.     multivitamin tablet  Take 1 tablet by mouth daily.     NIFEdipine 90 MG 24 hr tablet  Commonly known as:  ADALAT CC  Take 1 tablet (90 mg total) by mouth daily.     omeprazole 20 MG capsule  Commonly known as:  PRILOSEC  Take 1 capsule (20 mg total) by mouth 2 (two) times daily.     oxyCODONE 5 MG immediate release tablet  Commonly known as:  Oxy IR/ROXICODONE  Take 1 tablet (5 mg total) by mouth every 4 (four) hours as needed for moderate pain, severe pain or breakthrough pain.     piperacillin-tazobactam 3.375 GM/50ML IVPB  Commonly known as:  ZOSYN  Inject 50 mLs (3.375 g total) into the vein every 8 (eight) hours.     RED YEAST  RICE PO  Take 1 tablet by mouth 2 (two) times daily.     Rivaroxaban 15 MG Tabs tablet  Commonly known as:  XARELTO  Take 1 tablet (15 mg total) by mouth 2 (two) times daily with a meal.     rivaroxaban 20 MG Tabs tablet  Commonly known as:  XARELTO  Take 1 tablet (20 mg total) by mouth daily with supper. START 07/02/15     senna-docusate 8.6-50 MG per tablet  Commonly known as:  Senokot-S  Take 1 tablet by mouth 2 (two) times daily.     sertraline 100 MG tablet  Commonly known as:  ZOLOFT  Take 1 tablet (100 mg total) by mouth 2 (two) times daily.     tiotropium 18 MCG inhalation capsule  Commonly known as:  SPIRIVA HANDIHALER  Place 1 capsule (18 mcg total) into inhaler and inhale daily.     topiramate 50 MG tablet  Commonly known as:  TOPAMAX  Take 1 tablet (50 mg total) by mouth 2 (two) times daily.     vancomycin 1 GM/200ML Soln  Commonly known as:  VANCOCIN  Inject 200 mLs (1,000 mg total)  into the vein every 12 (twelve) hours.     Vitamin D3 1000 UNITS Caps  Take 1 capsule (1,000 Units total) by mouth daily.        Disposition and follow-up:   Ms.Jennifer [name] was discharged from [facility] in Good condition.  At the hospital follow up visit please address:  1.  S/p Cholecystectomy: Please continue to evaluate abdominal pain, nausea, vomiting. Will need follow up CMP and CBC, although prior to discharge, LFT's close to normal. No further leukocytosis.  Biliary Infection: Patient w/ bile fluid positive for pseudomonas (cipro resistant) and enterococcus. Need to continue Zosyn until 06/17/15, Vancomycin until 06/21/15 per discussion w/ ID (14 day course). Vancomycin levels need to be followed closely, although renal function is good.   Hypokalemia: Significant issue during initial part of admission, likely related to ?cholecystostomy losses. No other clear explanation for this, resolved after ostomy removed. Will need follow up electrolytes and close monitoring. HCTZ discontinued during admission d/t this. May need to be added back if K continues to be normal.   Atrial Fibrillation: Patient on Amiodarone, will need outpatient PFT's. Resume Xarelto 15 mg bid 06/10/15. Need to change to Xarelto 20 mg daily on 07/02/15.   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 Had previously, started on Xarelto for this. Resume Xarelto 15 mg bid 06/10/15. Change to Xarelto 20 mg daily on 07/02/15 as above.    2.  Labs / imaging needed at time of follow-up: CBC, CMP, Magnesium, EKG  3.  Pending labs/ test needing follow-up: None  Follow-up Appointments: Follow-up Information    Follow up with [facility] [place] On 06/24/2015.   Why:  For post-operation check. Your appointment is at 3:45, please arrive at least 30 min before your appointment to complete your check in paperwork.  If you are unable to arrive 30 min prior to your appointment time we may have to cancel or reschedule you.    Contact information:   [place] [address] [phone]      Follow up with [place].   Why:  Please make an appointment for 1 week after discharge from SNF.   Contact information:   [address] [place] [phone]      Discharge  Instructions: Discharge Instructions    Diet - low sodium heart healthy    Complete by:  As directed      Increase activity slowly    Complete by:  As directed            Consultations: Treatment Team:  Judeth Horn, MD Md [facility], MD  Procedures Performed:  Dg Cholangiogram Operative  06/06/2015   CLINICAL DATA:  Intraoperative cholangiogram for cholecystitis  EXAM: INTRAOPERATIVE CHOLANGIOGRAM  FLUOROSCOPY TIME:  9 seconds  COMPARISON:  Ultrasound fluoroscopic guided cholecystostomy tube placement - 04/26/2015; cholangiogram via existing cholecystostomy tube -06/03/2015; 05/25/2015; CT abdomen pelvis -06/02/2025  FINDINGS: Surgical clips overlie the expected location of the neck of the gallbladder.  A cholecystostomy tube overlies expected location of the gallbladder fossa.  Contrast injection fails to opacify any identifiable portion of the biliary system extravasation of contrast about the gallbladder fossa.  IMPRESSION: Nondiagnostic cholangiogram as above. Further evaluation with ERCP could be performed as clinically indicated   Electronically Signed   By: Sandi Mariscal M.D.   On: 06/06/2015 12:01   Ct Abdomen Pelvis W Contrast  06/03/2015   CLINICAL DATA:  Epigastric abdominal pain. History of bloody drainage from cholecystostomy tube.  EXAM: CT ABDOMEN AND PELVIS WITH CONTRAST  TECHNIQUE: Multidetector CT imaging of the abdomen and pelvis was performed using the standard protocol following bolus administration of intravenous contrast.  CONTRAST:  15mL OMNIPAQUE IOHEXOL 300 MG/ML  SOLN  COMPARISON:  Most recent CT 05/27/2015  FINDINGS: Minimal  atelectasis at the left lung base.  Percutaneous cholecystostomy tube in place. Do intraluminal hyperdensities, 1 in the gallbladder neck, 1 adjacent to the cholecystostomy tube, may reflect gallstones. There is mild interval distention the gallbladder compared to prior CT. Unchanged degree of extrahepatic biliary prominence, the common bile duct measures 1.4 cm, the contents of the common bile duct appear increased in density compared to prior. Small fluid collection just anterior to the cholecystostomy tube in the liver is slightly larger than on prior exam, currently 3.3 x 2.5 x 4.4 cm, previously 2.3 x 3.0 x 4.5 cm. There is minimal surrounding soft tissue stranding. There is probable thrombosis of portal venous branches in the left lobe of the liver that appears similar to prior exam.  Fatty atrophy of the pancreas, unchanged from prior. Left adrenal hypertrophy unchanged, right adrenal gland is normal. Spleen is normal. Kidneys demonstrate symmetric enhancement and excretion on delayed phase imaging.  Stomach physiologically distended. There are no dilated or thickened bowel loops. Moderate stool throughout the colon. Diverticulosis of the distal colon without diverticulitis. Sigmoid colon is tortuous. There is no free air or ascites.  Or unchanged aneurysm of the infrarenal abdominal aorta measuring up to 3.7 cm. There is a centric mural thrombus and diffuse atheromatous calcification. No retroperitoneal adenopathy.  Within the pelvis the bladder is physiologically distended. Uterus is surgically absent. Multiple pelvic phleboliths. No pelvic free fluid. No adnexal mass.  Scoliosis and degenerative change throughout the spine. There are no acute or suspicious osseous abnormalities.  IMPRESSION: 1. Cholecystostomy tube remains in place. Unchanged biliary prominence, however the density in the common bile duct is increased compared to prior, which may reflect complex fluid, hemorrhage or infection.  Alternatively, vicarious excretion of contrast related to recent prior CT is considered, but fell is likely. 2. Small fluid collection adjacent to the right lobe of the liver has minimally increased in size from prior exam, there is a small amount of surrounding inflammation. Hematoma or biloma again  considered. Given the surrounding inflammation, superimposed infection is not excluded. 3. 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 4. Infrarenal abdominal aortic aneurysm. Follow-up ultrasound in 2 years recommended.   Electronically Signed   By: Jeb Levering M.D.   On: 06/03/2015 02:42   Ct Abdomen Pelvis W Contrast  05/27/2015   CLINICAL DATA:  Status post percutaneous cholecystostomy.  EXAM: CT ABDOMEN AND PELVIS WITH CONTRAST  TECHNIQUE: Multidetector CT imaging of the abdomen and pelvis was performed using the standard protocol following bolus administration of intravenous contrast.  CONTRAST:  100 cc of Omnipaque 300  COMPARISON:  04/25/2015  FINDINGS: Lower chest: Dependent changes are noted within the posterior right lung base.  Hepatobiliary: A percutaneous cholecystostomy tube is identified and appears well suture rated within the gallbladder. The gallbladder appears decompressed. Small fluid collection ventral to the right hepatic lobe measures 2.3 x 3.0 x 4.5 cm, image 37/ series 2. This is mixed attenuation may represent a small biloma and/or hematoma. There is no significant biliary dilatation. 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 The hepatic veins appear patent.  Pancreas: Fatty replacement of the pancreas.  Spleen: Normal appearance of the spleen.  Adrenals/Urinary Tract: Left adrenal gland hypertrophy noted. Right adrenal gland appears normal. Unremarkable appearance of the kidneys. Urinary bladder appears normal.  Stomach/Bowel: The stomach is within  normal limits. The small bowel loops have a normal course and caliber. No obstruction. Normal appearance of the colon.  Vascular/Lymphatic: Calcified atherosclerotic disease involves the abdominal aorta. There is an infrarenal abdominal aortic aneurysm which measures 3.7 cm, image 50/series 2. No enlarged retroperitoneal or mesenteric adenopathy. No enlarged pelvic or inguinal lymph nodes.  Reproductive: Previous hysterectomy.  No adnexal mass.  Other: None.  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  IMPRESSION: 1. Interval percutaneous cholecystostomy with decompression of the gallbladder. There is no evidence for biliary obstruction. 2. Small fluid collection ventral to the right lobe of liver is identified. This is mixed attenuation and may represent a small biloma and/or small hematoma. No large hematoma is identified at this time. 3. 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 4. Aortic atherosclerosis 5. Infrarenal abdominal aortic aneurysm. Recommend followup by ultrasound in 2 years. This recommendation follows ACR consensus guidelines: White Paper of the ACR Incidental Findings Committee II on Vascular Findings. J Am Coll Radiol 2013; 10:789-794. 6. Critical Value/emergent results were called by telephone at the time of interpretation on 05/27/2015 at 7:44 pm to Dr. Carmin Muskrat , who verbally acknowledged these results.   Electronically Signed   By: Kerby Moors M.D.   On: 05/27/2015 19:44   Ir Sinus/fist Tube Chk-non Gi  05/27/2015   CLINICAL DATA:  Hemobilia, status post cholecystostomy, tachycardia, diaphoresis  EXAM: Fluoroscopic injection of the existing cholecystostomy  Date:  6/21/20166/21/2016 3:38 pm  Radiologist:  M. Daryll Brod, MD  Guidance:  Fluoroscopic  FLUOROSCOPY TIME:  54 seconds, 50 mGy  MEDICATIONS AND MEDICAL HISTORY: None.  ANESTHESIA/SEDATION:  None.  CONTRAST:  52mL OMNIPAQUE IOHEXOL 300 MG/ML  SOLN  COMPLICATIONS: None immediate  PROCEDURE: Informed consent was obtained from the patient following explanation of the procedure, risks, benefits and alternatives. The patient understands, agrees and consents for the procedure. All questions  were addressed. A time out was performed.  Under sterile conditions, the existing cholecystostomy was injected with contrast under fluoroscopy. Images obtained for documentation.  Cholangiogram: Stable position of the cholecystostomy tube within the gallbladder. Filling defects throughout the gallbladder could represent blood clots and/or stones. Cystic duct is patent. Contrast does reach the common hepatic duct. No communication to be any adjacent vascular structure to account for the hemobilia.  IMPRESSION: Cholecystostomy in good position.  Patent cystic duct.  Gallbladder diffuse filling defect compatible with blood clot versus cholelithiasis.   Electronically Signed   By: Jerilynn Mages.  Shick M.D.   On: 05/27/2015 16:26   Ir Cm Inj Any Colonic Tube W/fluoro  06/03/2015   CLINICAL DATA:  History of acute cholecystitis, post ultrasound fluoroscopic guided cholecystostomy tube placement on 04/26/2015.  Patient has been anticoagulated for concern of portal vein thrombosis involving the distal aspect of the intrahepatic portal venous system of the left lobe of the liver.  Since cholecystostomy tube placement, the patient has had persistent bloody output for which she underwent a fluoroscopic guided cholangiogram performed 05/25/19 16 which demonstrated multiple blood products within the gallbladder fossa.  Patient now with elevated LFTs with bloody output surrounding the cholecystostomy tube. Request made for repeat fluoroscopic guided cholangiogram peer  EXAM: GI TUBE INJECTION  COMPARISON:  CT abdomen pelvis - 06/03/2015; 05/27/2015; 04/25/2015; ultrasound fluoroscopic guided cholecystostomy tube placement - 04/26/2015;  cholangiogram via existing cholecystostomy tube -05/25/2015  CONTRAST:  31mL OMNIPAQUE IOHEXOL 300 MG/ML SOLN - administered via the existing cholecystostomy tube  FLUOROSCOPY TIME:  1 minutes, 18 seconds (92 mGy)  TECHNIQUE: The patient was positioned supine on the fluoroscopy table. A preprocedural spot fluoroscopic image was obtained of the right upper abdominal quadrant an existing percutaneous cholecystostomy tube.  Multiple spot fluoroscopic and radiographic images were obtained of the right upper abdominal quadrant following the injection of small amount of contrast via the existing cholecystostomy tube.  Images were reviewed and the procedure was terminated. Note, patient became nauseous following the injection of a small amount of contrast resulting in significant image degradation.  The cholecystostomy tube was secured in place with a new Stat Lock device.  The cholecystostomy tube was flushed with a small amount of saline and reconnected to the gravity bag.  FINDINGS: Preprocedural spot fluoroscopic image demonstrates unchanged positioning of the cholecystostomy tube overlying the right upper abdominal quadrant.  Contrast injection demonstrates near complete replacement of the gallbladder lumen with ill-defined filling defects favored to represent blood products.  There is minimal opacification of the cystic duct which opacifies the common bile and hepatic ducts which likewise R field with occlusive debris favored to represent blood products. Note, the patient became nauseous following the injection of contrast via the cholecystostomy tube, resulting in significant image degradation.  IMPRESSION: 1. Appropriately positioned cholecystostomy tube with end coiled and locked within the gallbladder fundus. No exchange performed. 2. Percutaneous cholangiogram demonstrates complete opacification of the gallbladder lumen as well as the opacified portions of the cystic, common hepatic and common bile ducts with  ill-defined filling defects favored to represent blood products.  PLAN: Given the very minimal amount of residual largely nonocclusive thrombus within the central aspect of the left intrahepatic biliary system, would advise against continued aggressive anti coagulation.  If patient is liver function tests continued to rise, would consider ERCP for potential temporizing biliary stent placement.  If patient continues to experience bloody drainage surrounding the cholecystostomy tube, this could be up sized to a 12 [place] percutaneous  drain, however would advise this procedure be performed with sedation secondary to patient's inability to tolerate today's percutaneous cholangiogram.  Above findings discussed with Randon Goldsmith, [facility] PA, at the time of procedure completion.   Electronically Signed   By: Sandi Mariscal M.D.   On: 06/03/2015 17:08   Dg Chest Port 1 View  05/27/2015   CLINICAL DATA:  Abdominal pain and tachycardia.  EXAM: PORTABLE CHEST - 1 VIEW  COMPARISON:  05/25/2015  FINDINGS: Portable view of the chest demonstrates low lung volumes. Difficult to exclude vascular congestion. Heart size is accentuated by the low lung volumes. Negative for pneumothorax.  IMPRESSION: Low lung volumes without focal disease. Difficult to exclude vascular congestion.   Electronically Signed   By: Markus Daft M.D.   On: 05/27/2015 17:54   Dg Chest Port 1 View  05/25/2015   CLINICAL DATA:  Acute chest pain.  EXAM: PORTABLE CHEST - 1 VIEW  COMPARISON:  Apr 25, 2015.  FINDINGS: Stable cardiomediastinal silhouette. No pneumothorax or pleural effusion is noted. Both lungs are clear. The visualized skeletal structures are unremarkable.  IMPRESSION: No acute cardiopulmonary abnormality seen.   Electronically Signed   By: Marijo Conception, M.D.   On: 05/25/2015 10:20   Ir Cholangiogram Existing Tube  05/27/2015   CLINICAL DATA:  Hemobilia. Examination of the drainage to for the cholecystostomy tube demonstrates predominately  serosanguineous fluid and a few scattered very dark gold blood clots. There is no bright red blood in the drainage bag.  EXAM: CHOLANGIOGRAM VIA EXISTING CATHETER  PROCEDURE: Contrast was injected into the cholecystostomy tube and imaging was obtained.  FINDINGS: There are filling defects in the gallbladder and cystic duct compatible with thrombus. The cystic duct is patent.  IMPRESSION: The cystic duct is patent. Blood clots are present in the gallbladder. Recommendations are for drain capping and resuming bag drainage tomorrow.   Electronically Signed   By: Marybelle Killings M.D.   On: 05/27/2015 07:51    Admission HPI: Pt is a 74 y/o F w/ PMHx of HTN, CHF, COPD, fibromyalgia, afib w/ RVR on xarelto, cholecystostomy tube placement on 5/20 for acute cholecystitis and recent admission for acute portal vein thrombosis who presents with abd pain and bloody drainage from cholecystostomy tube. Pt was recently discharged on 6/24 after she was admitted for the same reason and found to have a portal vein thrombosis. She was also found to be in afib w/ RVR and started on amiodarone and xarelto. On 6/27 pt developed abdominal pain and started noticing bloody drainage in her bag. Pt states that abd pain is 8/10 in severity and feels like a dull pain. She compares it to how she felt when she had her acute cholecystitis.She has not had a bowel movement since discharge on Friday and feels like she has gas. Denies fevers. She felt nauseous this morning and had one episode of vomiting while at [facility] ED. She then went to the [facility] ED at which time CT of abd/pelvis revealed a small fluid collection adjacent to the rt lobe of the liver w/ small amt of surround inflammation that could be a hematoma vs biloma vs superimposed infection. Surgery was contacted and recommended starting zosyn, and they will come evaluate her today.    Hospital Course by problem list:   1. Cholecystitis s/p cholecystostomy and cholecystectomy- Patient w/  previous admission in 04/2015 for cholecystitis, infection quite severe at that time, gallbladder quite thickened, surgery did not want to perform cholecystectomy at that time  d/t severity of illness. Cholecystostomy placed for drainage. Patient then returned w/ portal vein thrombosis, started on Xarelto (also to cover for new onset atrial fibrillation). Returned on 06/02/15 for continued bleeding from cholecystostomy as well as continued leukocytosis and elevated LFT's. Taken to the OR on 06/06/15 for cholecystectomy, slightly delayed d/t significant hypokalemia of 2.2-2.4. No apparent complications. LFT's trended down (still note quite normal prior to discharge), leukocytosis resolved. Bile cultures positive for Pseudomonas and Enterococcus, covered w/ Vancomycin + Zosyn as discussed below.   2. Biliary Infection- Bile fluid positive for Pseudomonas (resistant to Cipro) and Enterococcus. Started on Zosyn on 06/03/15 (end date 06/17/15) and Vancomycin started on 06/07/15 (end date 06/21/15). Enterococcus culture not positive until later. These will need to be continued at SNF for total of 14 days per discussion w/ ID for adequate clearance of bile infection.   3. Portal Vein Thrombosis- Discovered on previous admission, started on Xarelto for this as well as a-fib. Likely contributed to increasing LFT's. Patient then with continued bleeding into cholecystostomy, Xarelto stopped. LFT's continued to improve prior to discharge.   4. Atrial Fibrillation- Had RVR initially on previous admission, placed on Amiodarone per cardiology, likely related to acute illness. Placed on Xarelto, however, this was stopped d/t complications as above. Continued on Amiodarone. NSR during this admission per telemetry. RESTART XARELTO 06/10/15 per surgery. Will start 15 mg bid, then change to 20 mg qd on 07/02/15.   5. Hypokalemia- Patient w/ significant hypokalemia during admission, actually delayed surgery, as low as 2.2, most likely  related to cholecystostomy, as continued hypokalemia resolved s/p cholecystectomy. No other obvious etiology for this. HCTZ also held given hypokalemia. Can likely be restarted on discharge if K found to be normal on repeat. Daily K supplementation discontinued on 06/08/15.   6. HTN- Patient continued on home Nifedipine, HCTZ held in the setting of hypokalemia. BP only mildly elevated at times.   7. COPD- Continued on home inhalers.   Discharge Vitals:   BP 124/66 mmHg  Pulse 68  Temp(Src) 98.4 F (36.9 C) (Oral)  Resp 19  Ht 5\' 3"  (1.6 m)  Wt 235 lb 10.8 oz (106.9 kg)  BMI 41.76 kg/m2  SpO2 98%  Discharge Labs:  No results found for this or any previous visit (from the past 24 hour(s)).  Signed: Corky Sox, MD 06/10/2015, 11:59 AM    Services Ordered on Discharge: SNF Equipment Ordered on Discharge: SNF

## 2015-06-10 NOTE — Progress Notes (Signed)
Report given to Herbie Baltimore, Therapist, sports at [facility].

## 2015-06-10 NOTE — Clinical Social Work Note (Signed)
Patient to be d/c'ed today to [facility].  Patient and family agreeable to plans will transport via ems RN to call report.  Patient and family appreciative of care provided.  Evette Cristal, MSW, [place]

## 2015-06-10 NOTE — Progress Notes (Signed)
Discharge patient to [facility] transported by [employer].

## 2015-06-10 NOTE — Progress Notes (Signed)
Subjective: Patient was seen and examined at bedside today. Vitals are stable, no fever. Patient states she is feeling much better and has no complaints. Denies abdominal pain, CP, SOB, nausea, vomiting, or diarrhea. Patient awaiting picc line placement before transfer to [facility], will likely be discharged today.   Objective: Vital signs in last 24 hours: Filed Vitals:   06/09/15 2001 06/09/15 2236 06/10/15 0514 06/10/15 1051  BP:  107/87 124/66   Pulse:  67 68   Temp:  98 F (36.7 C) 98.4 F (36.9 C)   TempSrc:  Oral Oral   Resp:  17 19   Height:      Weight:      SpO2: 98% 99% 99% 98%   Weight change:   Intake/Output Summary (Last 24 hours) at 06/10/15 1140 Last data filed at 06/09/15 1814  Gross per 24 hour  Intake    320 ml  Output    430 ml  Net   -110 ml   Physical Exam  General: resting comfortably in bed, NAD HEENT: PERRL, EOMI, no scleral icterus Cardiac: RRR, no rubs, murmurs or gallops Pulm: clear to auscultation bilaterally, no wheezes, rales, or rhonchi Abd: soft, non tender, non distended, +BS. JP tube removed, site of tube placement covered with gauze. Ext: warm and well perfused, no pedal edema Neuro: alert and oriented X3, cranial nerves II-XII grossly intact, motor strength grossly intact, sensation to light touch intact   Lab Results: Basic Metabolic Panel:  Recent Labs Lab 06/06/15 0737  06/08/15 0426 06/09/15 0341  NA 131*  < > 133* 134*  K 3.4*  < > 4.0 4.0  CL 97*  < > 103 102  CO2 24  < > 24 22  GLUCOSE 97  < > 109* 105*  BUN 16  < > 6 <5*  CREATININE 0.71  < > 0.53 0.62  CALCIUM 8.8*  < > 8.8* 8.6*  MG 2.0  --  1.8  --   < > = values in this interval not displayed.  Liver Function Tests:  Recent Labs Lab 06/08/15 0426 06/09/15 0341  AST 38 29  ALT 84* 64*  ALKPHOS 342* 296*  BILITOT 1.2 0.9  PROT 5.3* 5.7*  ALBUMIN 1.9* 2.1*    Recent Labs Lab 06/04/15 1329  LIPASE 11*   No results for input(s):  AMMONIA in the last 168 hours.  CBC:  Recent Labs Lab 06/08/15 0426 06/09/15 0341  WBC 11.6* 8.9  HGB 8.1* 8.4*  HCT 25.2* 25.5*  MCV 98.1 100.0  PLT 384 350   Micro Results: Recent Results (from the past 240 hour(s))  Culture, body fluid-bottle     Status: None (Preliminary result)   Collection Time: 06/03/15  3:36 AM  Result Value Ref Range Status   Specimen Description BILE  Final   Special Requests NONE  Final   Gram Stain   Final    GRAM NEGATIVE RODS IN BOTH AEROBIC AND ANAEROBIC BOTTLES GRAM STAIN REVIEWED-AGREE WITH RESULT MVESTAL GRAM POSITIVE COCCI IN CHAINS ANAEROBIC BOTTLE ONLY    Culture PSEUDOMONAS AERUGINOSA ENTEROCOCCUS SPECIES   Final   Report Status PENDING  Incomplete   Organism ID, Bacteria PSEUDOMONAS AERUGINOSA  Final   Organism ID, Bacteria ENTEROCOCCUS SPECIES  Final      Susceptibility   Pseudomonas aeruginosa - MIC*    CEFTAZIDIME 4 SENSITIVE Sensitive     CIPROFLOXACIN >=4 RESISTANT Resistant     GENTAMICIN <=1 SENSITIVE Sensitive  IMIPENEM 2 SENSITIVE Sensitive     PIP/TAZO 8 SENSITIVE Sensitive     CEFEPIME 2 SENSITIVE Sensitive     * PSEUDOMONAS AERUGINOSA   Enterococcus species - MIC*    AMPICILLIN >=32 RESISTANT Resistant     VANCOMYCIN <=0.5 SENSITIVE Sensitive     GENTAMICIN SYNERGY SENSITIVE Sensitive     * ENTEROCOCCUS SPECIES  Gram stain     Status: None   Collection Time: 06/03/15  3:36 AM  Result Value Ref Range Status   Specimen Description BILE  Final   Special Requests NONE  Final   Gram Stain   Final    FEW WBC PRESENT, PREDOMINANTLY PMN MODERATE GRAM NEGATIVE RODS Gram Stain Report Called to,Read Back By and Verified With: T SPENCER,RN AT 1300 06/03/15 BY K BARR    Report Status 06/03/2015 FINAL  Final   Studies/Results: No results found. Medications: I have reviewed the patient's current medications. Scheduled Meds: . acetaminophen  650 mg Oral QID  . amiodarone  200 mg Oral BID  . amitriptyline  50 mg  Oral QHS  . bisacodyl  10 mg Rectal Daily  . mometasone-formoterol  2 puff Inhalation BID  . NIFEdipine  90 mg Oral Daily  . pantoprazole  40 mg Oral Daily  . piperacillin-tazobactam (ZOSYN)  IV  3.375 g Intravenous 3 times per day  . rivaroxaban  15 mg Oral BID  . senna-docusate  1 tablet Oral BID  . sertraline  100 mg Oral BID  . sodium chloride  3 mL Intravenous Q12H  . tiotropium  18 mcg Inhalation Daily  . topiramate  50 mg Oral BID  . vancomycin  1,000 mg Intravenous Q12H   Continuous Infusions: . lactated ringers 20 mL/hr at 06/08/15 1615   PRN Meds:.albuterol, ALPRAZolam, meclizine, methocarbamol, morphine injection, oxyCODONE, promethazine, sodium phosphate Assessment/Plan:  74 y/o F with a PMHx of anemia, GERD, PAF, depression, and anxiety admitted for cholecystitis with bleeding from cholecystostomy site, possible portal vein thrombosis with worsening LFTs, biliary pseudomonas and enterococcus infection. Patient underwent cholecystectomy on 06/06/15.  Cholecystitis s/p cholecystectomy - post op day #4. Patient doing well, JP drain removed yesterday. Patient awaiting picc line placement before transfer to [facility].  - Continue Zosyn for pseudomonas and Vancomycin for enterococcus, found positive in bile culture. - Continue pain mgmt - PT on board for ambulation  - As per surgery, patient is stable and possible D/C today. Pt to follow up with surgery as outpatient.    Portal vein thrombosis  - Continue Lovenox   Afib with RVR -l Patient was on Amiodarone and Xarelto. However, in the setting of surgery and cholecystotomy bleeding, we held Xarelto - cont Amiodarone  - Patient stable now, no active bleeding. Restarted Xarelto 15mg  BID - Lovenox discontinued   Hypokalemia - resolved Likely 2/2 to fluid losses via biliary tract. Patient's K has trended up since the surgery, 3.4 -->4.0.    HTN - BP stable. Patient on HCTZ 12.5mg  daily and Nifedipine 90mg   daily at home.  - hold HCTZ b/c of hypokalemia. Cont nifedipine.   CHF - stable. Patient does not any SOB or edema. Last TTE 04/2015 w/ EF 55-60% with grade 1 diastolic dysfunction  COPD-- stable -cont dulera1 puff bid, spiriva 18 mcg inh daily, and albuterol 1-2 puff q6hprn  Fibromyalgia- at home she takes norco 7.5-325mg  QID prn, amitriptyline 50mg  qhs, carisoprodol 350mg  qd prn - pain management as above  OSA- stable - cont CPAP qhs  Anxiety  and depression-- cont home zoloft 100mg  BID, elavil 50mg  qhs, and xanax 0.5mg  TID prn  Migraines- cont home med Topamax 50mg  BID  Pre-diabetes -hgba1c 6.0 - lifestyle mod.  DVT ppx - SCDs and Lovenox  Diet: cardiac  Code: full.  Dispo: Disposition is deferred at this time, awaiting improvement of current medical problems.  Anticipated discharge in approximately 0 day(s).   The patient does have a current PCP Bertha Stakes, MD) and does need an [facility] hospital follow-up appointment after discharge.  The patient does have transportation limitations that hinder transportation to clinic appointments.  .Services Needed at time of discharge: Y = Yes, Blank = No PT:   OT:   RN:   Equipment:   Other:     LOS: 7 days   Shela Leff, MD 06/10/2015, 11:40 AM

## 2015-06-10 NOTE — Discharge Instructions (Signed)
1. Please schedule hospital follow up appointment in internal medicine clinic for 1 week after discharge from SNF.  Jennifer [name]  [address] [place] [phone]  2. Please take all medications as previously prescribed with the following changes:  You are to continue Vancomycin for total of 14 days (end date 06/21/15) and Zosyn for 14 days (end date 06/17/15). This needs to be administered and followed at the skilled nursing facility.  Resume Xarelto 15 mg twice daily until 07/01/15. Change to 20 mg daily on 07/02/15.   3. If you have worsening of your symptoms or new symptoms arise, please call the clinic ([phone]), or go to the ER immediately if symptoms are severe.    SURGERY INSTRUCTIONS:   Your appointment is at 3:45, please arrive at least 30 min before your appointment to complete your check in paperwork.  If you are unable to arrive 30 min prior to your appointment time we may have to cancel or reschedule you.  LAPAROSCOPIC SURGERY: POST OP INSTRUCTIONS  1. DIET: Follow a light bland diet the first 24 hours after arrival home, such as soup, liquids, crackers, etc. Be sure to include lots of fluids daily. Avoid fast food or heavy meals as your are more likely to get nauseated. Eat a low fat the next few days after surgery.  2. Take your usually prescribed home medications unless otherwise directed. 3. PAIN CONTROL:  1. Pain is best controlled by a usual combination of three different methods TOGETHER:  1. Ice/Heat 2. Over the counter pain medication 3. Prescription pain medication 2. Most patients will experience some swelling and bruising around the incisions. Ice packs or heating pads (30-60 minutes up to 6 times a day) will help. Use ice for the first few days to help decrease swelling and bruising, then switch to heat to help relax tight/sore spots and speed recovery. Some people prefer to use ice alone, heat alone, alternating  between ice & heat. Experiment to what works for you. Swelling and bruising can take several weeks to resolve.  3. It is helpful to take an over-the-counter pain medication regularly for the first few weeks. Choose one of the following that works best for you:  1. Naproxen (Aleve, etc) Two 220mg  tabs twice a day 2. Ibuprofen (Advil, etc) Three 200mg  tabs four times a day (every meal & bedtime) 3. Acetaminophen (Tylenol, etc) 500-650mg  four times a day (every meal & bedtime) 4. A prescription for pain medication (such as oxycodone, hydrocodone, etc) should be given to you upon discharge. Take your pain medication as prescribed.  1. If you are having problems/concerns with the prescription medicine (does not control pain, nausea, vomiting, rash, itching, etc), please call us [phone] to see if we need to switch you to a different pain medicine that will work better for you and/or control your side effect better. 2. If you need a refill on your pain medication, please contact your pharmacy. They will contact our office to request authorization. Prescriptions will not be filled after 5 pm or on week-ends. 4. Avoid getting constipated. Between the surgery and the pain medications, it is common to experience some constipation. Increasing fluid intake and taking a fiber supplement (such as Metamucil, Citrucel, FiberCon, MiraLax, etc) 1-2 times a day regularly will usually help prevent this problem from occurring. A mild laxative (prune juice, Milk of Magnesia, MiraLax, etc) should be taken according to package directions if there are no bowel movements  after 48 hours.  5. Watch out for diarrhea. If you have many loose bowel movements, simplify your diet to bland foods & liquids for a few days. Stop any stool softeners and decrease your fiber supplement. Switching to mild anti-diarrheal medications (Kayopectate, Pepto Bismol) can help. If this worsens or does not improve, please call us. 6. Wash / shower  every day. You may shower over the dressings as they are waterproof. Continue to shower over incision(s) after the dressing is off. 7. Remove your waterproof bandages 5 days after surgery. You may leave the incision open to air. You may replace a dressing/Band-Aid to cover the incision for comfort if you wish.  8. ACTIVITIES as tolerated:  1. You may resume regular (light) daily activities beginning the next day--such as daily self-care, walking, climbing stairs--gradually increasing activities as tolerated. If you can walk 30 minutes without difficulty, it is safe to try more intense activity such as jogging, treadmill, bicycling, low-impact aerobics, swimming, etc. 2. Save the most intensive and strenuous activity for last such as sit-ups, heavy lifting, contact sports, etc Refrain from any heavy lifting or straining until you are off narcotics for pain control.  3. DO NOT PUSH THROUGH PAIN. Let pain be your guide: If it hurts to do something, don't do it. Pain is your body warning you to avoid that activity for another week until the pain goes down. 4. You may drive when you are no longer taking prescription pain medication, you can comfortably wear a seatbelt, and you can safely maneuver your car and apply brakes. 5. You may have sexual intercourse when it is comfortable.  9. FOLLOW UP in our office  1. Please call [facility] at [phone] to set up an appointment to see your surgeon in the office for a follow-up appointment approximately 2-3 weeks after your surgery. 2. Make sure that you call for this appointment the day you arrive home to insure a convenient appointment time.      10. IF YOU HAVE DISABILITY OR FAMILY LEAVE FORMS, BRING THEM TO THE               OFFICE FOR PROCESSING.   WHEN TO CALL us [phone]:  1. Poor pain control 2. Reactions / problems with new medications (rash/itching, nausea, etc)  3. Fever over 101.5 F (38.5 C) 4. Inability to urinate 5. Nausea and/or  vomiting 6. Worsening swelling or bruising 7. Continued bleeding from incision. 8. Increased pain, redness, or drainage from the incision  The clinic staff is available to answer your questions during regular business hours (8:30am-5pm). Please dont hesitate to call and ask to speak to one of our nurses for clinical concerns.  If you have a medical emergency, go to the nearest emergency room or call 911.  A surgeon from [facility] is always on call at the [facility], [place], [place], [place], [place] [address] ?  MAIN: [phone] ? TOLL FREE: [phone] ?  FAX [phone]  [email or web address]

## 2015-06-10 NOTE — Progress Notes (Signed)
Patient seen and examined. Case d/w residents in detail. I agree with findings and plan as documented in Dr. Elon Jester note.  Patient feels better today with no new complaints. PICC line placed today and patient will c/w IV abx to complete course at SNF. She resumed xarelto today for anticoagulation and lovenox has been dc'd (patient with portal vein thrombosis and afib). She is stable for d/c to SNF today

## 2015-06-12 ENCOUNTER — Other Ambulatory Visit: Payer: Self-pay | Admitting: *Deleted

## 2015-06-12 MED ORDER — OXYCODONE HCL 5 MG PO TABS: 5.0000 mg | ORAL_TABLET | ORAL | Status: DC | PRN

## 2015-06-12 MED ORDER — ALPRAZOLAM 0.5 MG PO TABS: 0.5000 mg | ORAL_TABLET | Freq: Three times a day (TID) | ORAL | Status: DC | PRN

## 2015-06-13 ENCOUNTER — Non-Acute Institutional Stay (SKILLED_NURSING_FACILITY): Payer: Medicare HMO | Admitting: Adult Health

## 2015-06-13 ENCOUNTER — Other Ambulatory Visit: Payer: Self-pay | Admitting: *Deleted

## 2015-06-13 ENCOUNTER — Telehealth: Payer: Self-pay | Admitting: *Deleted

## 2015-06-13 DIAGNOSIS — I480 Paroxysmal atrial fibrillation: Secondary | ICD-10-CM

## 2015-06-13 DIAGNOSIS — K810 Acute cholecystitis: Secondary | ICD-10-CM

## 2015-06-13 DIAGNOSIS — J439 Emphysema, unspecified: Secondary | ICD-10-CM

## 2015-06-13 DIAGNOSIS — M797 Fibromyalgia: Secondary | ICD-10-CM

## 2015-06-13 DIAGNOSIS — F329 Major depressive disorder, single episode, unspecified: Secondary | ICD-10-CM

## 2015-06-13 DIAGNOSIS — R51 Headache: Secondary | ICD-10-CM

## 2015-06-13 DIAGNOSIS — K219 Gastro-esophageal reflux disease without esophagitis: Secondary | ICD-10-CM | POA: Diagnosis not present

## 2015-06-13 DIAGNOSIS — R519 Headache, unspecified: Secondary | ICD-10-CM

## 2015-06-13 DIAGNOSIS — I820 Budd-Chiari syndrome: Secondary | ICD-10-CM

## 2015-06-13 DIAGNOSIS — F32A Depression, unspecified: Secondary | ICD-10-CM

## 2015-06-13 MED ORDER — ALPRAZOLAM 0.5 MG PO TABS: 0.5000 mg | ORAL_TABLET | Freq: Three times a day (TID) | ORAL | Status: DC | PRN

## 2015-06-13 NOTE — Telephone Encounter (Signed)
rec'd request for refill of soma from [facility], called [facility] and spoke w/ NP, the facility will fill her meds and determine her needs while there

## 2015-06-16 ENCOUNTER — Encounter: Payer: Self-pay | Admitting: Adult Health

## 2015-06-16 NOTE — Progress Notes (Signed)
Patient ID: Jennifer [name], female   DOB: 1941-02-26, 74 y.o.   MRN: [id number]  Jennifer [name] living [place]     Allergies  Allergen Reactions  . Tetracycline Other (See Comments)    REACTION: stomach upset  . Flonase [Fluticasone] Cough  . Lisinopril Cough       Chief Complaint  Patient presents with  . Hospitalization Follow-up    HPI:  She has been hospitalized for cholecystitis stat post cholecystostomy and cholecystectomy. She has biliary infection requiring iv abt. The goals of her care remain short term rehab with her returning back home. She tells me today that she is feeling better and is glad to be out of the hospital.  There are no nursing concerns today.   Past Medical History  Diagnosis Date  . COPD (chronic obstructive pulmonary disease)     O2 dependent. Followed by Dr. Joya Gaskins  . Fibromyalgia   . Hypertension   . OSA (obstructive sleep apnea)     Sleep study done April 02, 2008 showed severe obstructive sleep apnea.  . Diastolic dysfunction     Grade I diastolic dysfunction as shown on ECHO Dec 2011.  EF of 65-70%.  Marland Kitchen COPD (chronic obstructive pulmonary disease)     Followed by Dr. Joya Gaskins  . Dyslipidemia   . Osteopenia     DXA scan done on 10/23/2009 showed osteopenia of AP spine (young adult T-score -1.3), osteopenia of left femur neck (young adult T-score -1.8), and normal bone density of right femur neck (young adult T-score -0.8), with a FRAX estimate of the 10-year probability of a major osteoporotic fracture of 9.7% and probability of hip fracture 1.6%.  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 Estanislado Pandy on 11/10/2004.  MRA of the head on 03/09/2012 showed no evidence for recanalization.  . Osteoarthrosis involving multiple sites   . Chronic back pain   . Depression   . Anxiety   . Anemia   . GERD (gastroesophageal reflux disease)   . Meniere disease     Followed by ENT Dr. Silvestre Moment  . Diverticulosis of colon   . Multiple pigmented nevi   . Hypokalemia   . S/P hysterectomy 1975  . Pneumonia 11/2004  . Knee pain   . Dysphagia   . Urosepsis 11/04/2010    Klebsiella pneumoniae  . Breast pain   . Allergic rhinitis   . Fibromyalgia   . 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     . Urinary incontinence     Past Surgical History  Procedure Laterality Date  . Abdominal hysterectomy  1975  . 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 Estanislado Pandy on 11/10/2004.  Marland Kitchen Ct perc cholecystostomy  04/26/15    Dr. Anselm Pancoast  . Cholecystectomy N/A 06/06/2015    Procedure: LAPAROSCOPIC CHOLECYSTECTOMY WITH INTRAOPERATIVE CHOLANGIOGRAM;  Surgeon: Donnie Mesa, MD;  Location: [facility];  Service: General;  Laterality: N/A;    VITAL SIGNS BP 127/69 mmHg  Pulse 75  Ht _0  (1.6 m)  Wt 217 lb (98.431 kg)  BMI 38.45 kg/m2   Outpatient Encounter Prescriptions as of 06/13/2015  Medication Sig  . albuterol (PROAIR HFA)  108 (90 BASE) MCG/ACT inhaler  Inhale 2 puffs into the lungs every 6 (six) hours as needed for wheezing or shortness of breath. )  . ALPRAZolam (XANAX) 0.5 MG tablet Take 1 tablet (0.5 mg total) by mouth 3 (three) times daily as needed for anxiety.  Marland Kitchen amiodarone (PACERONE) 200 MG tablet Take 1 tablet (200 mg total) by mouth 2 (two) times daily.  Marland Kitchen amitriptyline (ELAVIL) 50 MG tablet Take 1 tablet (50 mg total) by mouth at bedtime.  Marland Kitchen aspirin 81 MG EC tablet Take 81 mg by mouth daily.    . Cholecalciferol (VITAMIN D3) 1000 UNITS CAPS Take 1 capsule (1,000 Units total) by mouth daily.  Marland Kitchen docusate sodium (COLACE) 100 MG capsule Take 100 mg by mouth daily as needed for mild  constipation or moderate constipation.  . Fluticasone-Salmeterol (ADVAIR DISKUS) 250-50 MCG/DOSE AEPB Inhale 1 puff into the lungs 2 (two) times daily.  . hydroxypropyl methylcellulose (ISOPTO TEARS) 2.5 % ophthalmic solution Place 2 drops into both eyes 3 (three) times daily as needed. Dry eye  . meclizine (ANTIVERT) 12.5 MG tablet Take 1 tablet (12.5 mg total) by mouth 2 (two) times daily as needed for dizziness.  . Multiple Vitamin (MULTIVITAMIN) tablet Take 1 tablet by mouth daily.  Marland Kitchen NIFEdipine (ADALAT CC) 90 MG 24 hr tablet Take 1 tablet (90 mg total) by mouth daily.  Marland Kitchen omeprazole (PRILOSEC) 20 MG capsule Take 1 capsule (20 mg total) by mouth 2 (two) times daily.  Marland Kitchen oxyCODONE (OXY IR/ROXICODONE) 5 MG immediate release tablet Take 1 tablet (5 mg total) by mouth every 4 (four) hours as needed for moderate pain, severe pain or breakthrough pain.  . piperacillin-tazobactam (ZOSYN) 3.375 GM/50ML IVPB Inject 50 mLs (3.375 g total) into the vein every 8 (eight) hours.  . Red Yeast Rice Extract (RED YEAST RICE PO) Take 1 tablet by mouth 2 (two) times daily.   . Rivaroxaban (XARELTO) 15 MG TABS tablet Take 1 tablet (15 mg total) by mouth 2 (two) times daily with a meal.  . rivaroxaban (XARELTO) 20 MG TABS tablet Take 1 tablet (20 mg total) by mouth daily with supper.  . senna-docusate (SENOKOT-S) 8.6-50 MG per tablet Take 1 tablet by mouth 2 (two) times daily.  . sertraline (ZOLOFT) 100 MG tablet Take 1 tablet (100 mg total) by mouth 2 (two) times daily.  Marland Kitchen tiotropium (SPIRIVA HANDIHALER) 18 MCG inhalation capsule Place 1 capsule (18 mcg total) into inhaler and inhale daily.  Marland Kitchen topiramate (TOPAMAX) 50 MG tablet Take 1 tablet (50 mg total) by mouth 2 (two) times daily.  . vancomycin (VANCOCIN) 1 GM/200ML SOLN Inject 200 mLs (1,000 mg total) into the vein every 12 (twelve) hours.      SIGNIFICANT DIAGNOSTIC EXAMS   04-26-15: 2-d echo:  - Left ventricle: The cavity size was normal. Wall thickness  was normal. Systolic function was normal. The estimated ejection fraction was in the range of 55% to 60%. Wall motion was normal; there were no regional wall motion abnormalities. Doppler parameters are consistent with abnormal left ventricular relaxation (grade 1 diastolic dysfunction). - Ascending aorta: The ascending aorta was mildly dilated. - Left atrium: The atrium was mildly dilated.  05-25-15: chest x-ray; No acute cardiopulmonary abnormality seen.   05-27-15: cholangiogram: The cystic duct is patent. Blood clots are present in the gallbladder. Recommendations are for drain capping and resuming bag drainage tomorrow.  05-27-15: chest x-ray: Low lung volumes without focal disease. Difficult to exclude vascular congestion.  05-27-15: ct  of abdomen and pelvis: 1. Interval percutaneous cholecystostomy with decompression of the gallbladder. There is no evidence for biliary obstruction. 2. Small fluid collection ventral to the right lobe of liver is identified. This is mixed attenuation and may represent a small biloma and/or small hematoma. No large hematoma is identified at this time. 3. 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 4. Aortic atherosclerosis 5. Infrarenal abdominal aortic aneurysm. Recommend followup by ultrasound in 2 years.   06-03-15: ct of abdomen and pelvis: 1. Cholecystostomy tube remains in place. Unchanged biliary prominence, however the density in the common bile duct is increased compared to prior, which may reflect complex fluid, hemorrhage or infection. Alternatively, vicarious excretion of contrast related to recent prior CT is considered, but fell is likely. 2. Small fluid collection adjacent to the right lobe of the liver has minimally increased in size from prior exam, there is a small amount of surrounding inflammation. Hematoma or biloma again considered. Given the surrounding  inflammation, superimposed infection is not excluded. 3. 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 4. Infrarenal abdominal aortic aneurysm. Follow-up ultrasound in 2 years recommended.     LABS REVIEWED:   05-25-15: wbc 8.3; hgb 12.2; hct 36.7; mcv 98.4; plt 210; glucose 202; bun 10; creat 0.58; k+3.4; na++136; ast 1065; alt 493; alk phos 240; t bili 1.6; albumin 3.0 05-27-15: wbc 15.5; hgb 11.3; hct 33.4.; mcv 96.5; plt 236; glucose 119; bun 13; creat 0.68; k+2.8; na++ 132; mag 1.8; ast 143; alt 523; alk phos 249; t bili 3.1; albumin 2.5 05-29-15: wbc 13.3; hgb 9.5; hct 28.7; mcv 97.6; plt 210; glucose 129; bun 5; creat 0.57; k+3.2;  na++ 134; ast 26; alt 195; alk phos 195; t bili 1.2; albumin 1.9; phos 2.5; hgb a1c 6.0; HIV; nr;   tsh 2.895 AFP rumor 3.4  05-30-15: wbc 12.5; hgb 9.7; hct 29.7; mcv 99; plt 230; glucose 105; bun <5; creat 0.58; k+4.0; na++137; ast 20; alt 135; alk phos 183; t bili 1.0; albumin 2.0; mag 2.0  06-02-15: wbc 15.4; hgb 11.0; hct 33.8; mcv 99.1; plt 412; glucose 188; bun 12; creat 0.73; k+3.9; na++133; ast 188; alt 94; alk phos 566; t bili 1.5; albumin 2.8 06-07-15: wbc 15.3; hgb 8.6; hct 26.2; mcv 96.3; plt 403; glucose 90; bun 11; creat 0.62; k+3.4; na++132; ast 54; alt 125; alk phos 449; t bili 1.1; albumin 1.9 06-09-15: wbc 8.9; hgb 8.4; hct 25.5; mcv 100.0; plt 350; glucose 105; bun <5; creat 0.62; k+4.0; na++134; ast 29; alt 64; alk phos 296; albumin 2.1     ROS Constitutional: Negative for malaise/fatigue.  Respiratory: Negative for cough and shortness of breath.   Cardiovascular: Negative for chest pain, palpitations and leg swelling.  Gastrointestinal: Negative for heartburn, abdominal pain, diarrhea and constipation.  Musculoskeletal: Negative for myalgias and joint pain.  Skin: Negative.   Psychiatric/Behavioral: The patient is not nervous/anxious.      Physical Exam Constitutional: She is oriented to  person, place, and time. No distress.  Obese   Neck: Neck supple. No JVD present. No thyromegaly present.  Cardiovascular: Normal rate, regular rhythm and intact distal pulses.   Respiratory: Effort normal and breath sounds normal. No respiratory distress.  GI: Soft. Bowel sounds are normal. She exhibits no distension. There is no tenderness.    Musculoskeletal: She exhibits no edema.   Is  able to move all extremities   Neurological: She is alert and oriented to person, place, and time.  Skin: Skin is warm and dry. She is not diaphoretic.  Has steri strips in place on incision lines without signs of infection present       ASSESSMENT/ PLAN:  1. Portal vein thrombus: is on xarelto 15 mg twice daily for 3 weeks then 20 mg daily will continue to monitor her status   2. Afib: her rate is stable will continue amiodarone 200 mg twice daily asa 81 mg daily and xarelto daily will monitor her status.   3. Hypertension: will continue procardia xl 90 mg daily and will monitor   4. COPD: will continue spiriva daily; advair 250/50 twice daily; albuterol 2 puffs every 6 hours as needed;   5. Hypokalemia: is currently not on replacement will monitor   6. Jerrye Bushy: will continue prilosec 20 mg twice daily    7. Fibromyalgia: will continue elavil 50 mg nightly; oxycodone 5 mg every 6 hours as needed   8. Chronic headaches: will continue topamax 50 mg twice daily   9. Depression with anxiety: will continue zoloft 100 mg twice daily and xanax 0.25 mg three times daily as needed   10. Cholecystitis: is status post cholecystectomy; will continue therapy as indicated for her short term rehab to improve upon her gait; balance; ald retraining. Has oxycodone 5 mg very 6 hours as needed     Time spent with patient 50 minutes: >50% spent with patient counseling; reviewing labs; tests; medical records; and plan of care     Ok Edwards NP [facility]  Contact [phone] Monday through  Friday 8am- 5pm  After hours call [phone]

## 2015-06-17 ENCOUNTER — Non-Acute Institutional Stay (SKILLED_NURSING_FACILITY): Payer: Medicare HMO | Admitting: Internal Medicine

## 2015-06-17 DIAGNOSIS — F411 Generalized anxiety disorder: Secondary | ICD-10-CM | POA: Diagnosis not present

## 2015-06-17 DIAGNOSIS — G4700 Insomnia, unspecified: Secondary | ICD-10-CM | POA: Diagnosis not present

## 2015-06-17 DIAGNOSIS — K8309 Other cholangitis: Secondary | ICD-10-CM

## 2015-06-17 DIAGNOSIS — J439 Emphysema, unspecified: Secondary | ICD-10-CM | POA: Diagnosis not present

## 2015-06-17 DIAGNOSIS — M2550 Pain in unspecified joint: Secondary | ICD-10-CM | POA: Diagnosis not present

## 2015-06-17 DIAGNOSIS — I480 Paroxysmal atrial fibrillation: Secondary | ICD-10-CM | POA: Diagnosis not present

## 2015-06-17 DIAGNOSIS — F332 Major depressive disorder, recurrent severe without psychotic features: Secondary | ICD-10-CM | POA: Diagnosis not present

## 2015-06-17 DIAGNOSIS — I5032 Chronic diastolic (congestive) heart failure: Secondary | ICD-10-CM

## 2015-06-17 DIAGNOSIS — K830 Cholangitis: Secondary | ICD-10-CM

## 2015-06-19 ENCOUNTER — Encounter: Payer: Self-pay | Admitting: Internal Medicine

## 2015-06-19 NOTE — Progress Notes (Signed)
Patient ID: Jennifer [name], female   DOB: Jun 23, 1941, 74 y.o.   MRN: [id number]    HISTORY AND PHYSICAL   DATE: 06/17/15  Location:  [facility]    Place of Service: SNF [phone])   Extended Emergency Contact Information Primary Emergency Contact: Swanson,Beverly Address: [address]          [place] Phone: [phone] Mobile Phone: [phone] Relation: Daughter  Advanced Directive information  FULL CODE  Chief Complaint  Patient presents with  . Readmit To SNF    HPI:  74 yo female seen today as a readmission into SNF following hospital stay for cholecystitis s/p lap chole, biliary infection resulting in sepsis and now on IV abx, diastolic HF, aflutter/afib on xeralto, anxiety d/o, MDD, insomnia, OA and COPD. She will cont vanco through 7/16th and zosyn through 7/12th. She has a right arm PICC. Her bile fluid grew out pseudomonas (cipro resistant) and enterococcus. Currently taking xeralto BID but will reduce to daily on 7/27th. She was dx with portal vein thrombosis. Surgery f/u scheduled for 7/19th.  She has no c/o today. No N/V. (+) abdominal discoloration. BMs regular. Appetite intact. Sleeping well. No f/c. Pain controlled. No nursing issues. No falls. She started PT. She is a poor historian due to psych issues. Hx obtained from chart   Afib - rate controlled on amiodarone. Takes ASA and xarelto  Hypertension - BP controlled on procardia    COPD - stable on spiriva, advair, and prn albuterol HFA  Gerd - stable on prilosec    Fibromyalgia - stable on elavil and prn oxycodone IR  Chronic headaches - controlled on topamax  MDD/GAD - stable on zoloft and prn xanax No nursing issues. No falls. She started PT. She is a poor historian due to psych issues. Hx obtained from chart    Past Medical History  Diagnosis Date  . COPD (chronic obstructive pulmonary disease)     O2 dependent. Followed by Dr. Joya Gaskins  .  Fibromyalgia   . Hypertension   . OSA (obstructive sleep apnea)     Sleep study done April 02, 2008 showed severe obstructive sleep apnea.  . Diastolic dysfunction     Grade I diastolic dysfunction as shown on ECHO Dec 2011.  EF of 65-70%.  Marland Kitchen COPD (chronic obstructive pulmonary disease)     Followed by Dr. Joya Gaskins  . Dyslipidemia   . Osteopenia     DXA scan done on 10/23/2009 showed osteopenia of AP spine (young adult T-score -1.3), osteopenia of left femur neck (young adult T-score -1.8), and normal bone density of right femur neck (young adult T-score -0.8), with a FRAX estimate of the 10-year probability of a major osteoporotic fracture of 9.7% and probability of hip fracture 1.6%.  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 Estanislado Pandy on 11/10/2004.  MRA of the head on 03/09/2012 showed no evidence for recanalization.  . Osteoarthrosis involving multiple sites   . Chronic back pain   . Depression   . Anxiety   . Anemia   . GERD (gastroesophageal reflux disease)   . Meniere disease     Followed by ENT Dr. Silvestre Moment  . Diverticulosis of colon   . Multiple pigmented nevi   . Hypokalemia   . S/P hysterectomy 1975  . Pneumonia 11/2004  . Knee pain   . Dysphagia   . Urosepsis 11/04/2010  Klebsiella pneumoniae  . Breast pain   . Allergic rhinitis   . Fibromyalgia   . 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     . Urinary incontinence     Past Surgical History  Procedure Laterality Date  . Abdominal hysterectomy  1975  . 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 Estanislado Pandy on 11/10/2004.  Marland Kitchen Ct perc  cholecystostomy  04/26/15    Dr. Anselm Pancoast  . Cholecystectomy N/A 06/06/2015    Procedure: LAPAROSCOPIC CHOLECYSTECTOMY WITH INTRAOPERATIVE CHOLANGIOGRAM;  Surgeon: Donnie Mesa, MD;  Location: [place];  Service: General;  Laterality: N/A;    Patient Care Team: Bertha Stakes, MD as PCP - General Elsie Stain, MD (Pulmonary Disease)  History   Social History  . Marital Status: Widowed    Spouse Name: N/A  . Number of Children: N/A  . Years of Education: N/A   Occupational History  . Not on file.   Social History Main Topics  . Smoking status: Former Smoker -- 0.50 packs/day for 30 years    Types: Cigarettes    Quit date: 12/07/2008  . Smokeless tobacco: Former Systems developer    Types: [place] date: 12/06/1978  . Alcohol Use: No  . Drug Use: No  . Sexual Activity: No   Other Topics Concern  . Not on file   Social History Narrative   Financial assistance approved for 100% discount after Medicare pays for [facility] only, not eligible for [facility] card. Per Bonna Gains     reports that she quit smoking about 6 years ago. Her smoking use included Cigarettes. She has a 15 pack-year smoking history. She quit smokeless tobacco use about 36 years ago. Her smokeless tobacco use included Chew. She reports that she does not drink alcohol or use illicit drugs.  Family History  Problem Relation Age of Onset  . Ulcers Mother   . Ovarian cancer Sister 41  . Cervical cancer Daughter   . Cervical cancer Daughter   . Lung cancer Neg Hx   . Colon cancer Neg Hx   . Heart attack Father   . Cerebral aneurysm Sister 41   Family Status  Relation Status Death Age  . Mother Deceased 15  . Sister Deceased Age 70  . Daughter Alive   . Daughter Alive   . Father Deceased 45s    Died of heart attack  . Brother Marketing executive History  Administered Date(s) Administered  . H1N1 11/20/2008  . Influenza Split 10/08/2011, 08/22/2012  . Influenza Whole 10/06/2005, 10/06/2006, 09/12/2008, 09/02/2009,  10/28/2010  . Influenza,inj,Quad PF,36+ Mos 08/15/2013, 10/14/2014  . Pneumococcal Conjugate-13 12/12/2014  . Pneumococcal Polysaccharide-23 09/27/2002, 11/12/2009  . Td 11/18/2010  . Zoster 09/07/2012    Allergies  Allergen Reactions  . Tetracycline Other (See Comments)    REACTION: stomach upset  . Flonase [Fluticasone] Cough  . Lisinopril Cough    Medications: Patient's Medications  New Prescriptions   No medications on file  Previous Medications   ALBUTEROL (PROAIR HFA) 108 (90 BASE) MCG/ACT INHALER    INHALE 2 PUFFS BY MOUTH INTO LUNGS FOUR TIMES DAILY AS NEEDED   ALPRAZOLAM (XANAX) 0.5 MG TABLET    Take 1  tablet (0.5 mg total) by mouth 3 (three) times daily as needed for anxiety.   AMIODARONE (PACERONE) 200 MG TABLET    Take 1 tablet (200 mg total) by mouth 2 (two) times daily.   AMITRIPTYLINE (ELAVIL) 50 MG TABLET    Take 1 tablet (50 mg total) by mouth at bedtime.   ASPIRIN 81 MG EC TABLET    Take 81 mg by mouth daily.     CHOLECALCIFEROL (VITAMIN D3) 1000 UNITS CAPS    Take 1 capsule (1,000 Units total) by mouth daily.   DOCUSATE SODIUM (COLACE) 100 MG CAPSULE    Take 100 mg by mouth daily as needed for mild constipation or moderate constipation.   FLUTICASONE-SALMETEROL (ADVAIR DISKUS) 250-50 MCG/DOSE AEPB    Inhale 1 puff into the lungs 2 (two) times daily.   HYDROXYPROPYL METHYLCELLULOSE (ISOPTO TEARS) 2.5 % OPHTHALMIC SOLUTION    Place 2 drops into both eyes 3 (three) times daily as needed. Dry eye   MECLIZINE (ANTIVERT) 12.5 MG TABLET    Take 1 tablet (12.5 mg total) by mouth 2 (two) times daily as needed for dizziness.   MULTIPLE VITAMIN (MULTIVITAMIN) TABLET    Take 1 tablet by mouth daily.   NIFEDIPINE (ADALAT CC) 90 MG 24 HR TABLET    Take 1 tablet (90 mg total) by mouth daily.   OMEPRAZOLE (PRILOSEC) 20 MG CAPSULE    Take 1 capsule (20 mg total) by mouth 2 (two) times daily.   OXYCODONE (OXY IR/ROXICODONE) 5 MG IMMEDIATE RELEASE TABLET    Take 1 tablet (5 mg  total) by mouth every 4 (four) hours as needed for moderate pain, severe pain or breakthrough pain.   PIPERACILLIN-TAZOBACTAM (ZOSYN) 3.375 GM/50ML IVPB    Inject 50 mLs (3.375 g total) into the vein every 8 (eight) hours.   RED YEAST RICE EXTRACT (RED YEAST RICE PO)    Take 1 tablet by mouth 2 (two) times daily.    RIVAROXABAN (XARELTO) 15 MG TABS TABLET    Take 1 tablet (15 mg total) by mouth 2 (two) times daily with a meal.   RIVAROXABAN (XARELTO) 20 MG TABS TABLET    Take 1 tablet (20 mg total) by mouth daily with supper.   SENNA-DOCUSATE (SENOKOT-S) 8.6-50 MG PER TABLET    Take 1 tablet by mouth 2 (two) times daily.   SERTRALINE (ZOLOFT) 100 MG TABLET    Take 1 tablet (100 mg total) by mouth 2 (two) times daily.   TIOTROPIUM (SPIRIVA HANDIHALER) 18 MCG INHALATION CAPSULE    Place 1 capsule (18 mcg total) into inhaler and inhale daily.   TOPIRAMATE (TOPAMAX) 50 MG TABLET    Take 1 tablet (50 mg total) by mouth 2 (two) times daily.   VANCOMYCIN (VANCOCIN) 1 GM/200ML SOLN    Inject 200 mLs (1,000 mg total) into the vein every 12 (twelve) hours.  Modified Medications   No medications on file  Discontinued Medications   No medications on file    Review of Systems  Unable to perform ROS: Psychiatric disorder    Filed Vitals:   06/19/15 1318  BP: 146/79  Pulse: 74  Temp: 97.5 F (36.4 C)  Weight: 217 lb (98.431 kg)   Body mass index is 38.45 kg/(m^2).  Physical Exam  Constitutional: She appears well-developed.  Sitting in w/c in NAD  HENT:  Mouth/Throat: Oropharynx is clear and moist. No oropharyngeal exudate.  Eyes: Pupils are equal, round, and reactive to light. No scleral icterus.  Neck: Neck  supple. Carotid bruit is not present. No tracheal deviation present. No thyromegaly present.  Cardiovascular: Normal rate, regular rhythm, normal heart sounds and intact distal pulses.  Exam reveals no gallop and no friction rub.   No murmur heard. Right arm PICC intact with no signs of  secondary infection at insertion site. No LE edema b/l. No calf TTP  Pulmonary/Chest: Effort normal and breath sounds normal. No stridor. No respiratory distress. She has no wheezes. She has no rales.  Reduced BS at base b/l  Abdominal: Soft. Bowel sounds are normal. She exhibits distension. She exhibits no mass. There is no hepatomegaly. There is tenderness (minimum). There is no rebound and no guarding.  Surgical incisions healing well with no secondary signs of infection or wound dehiscence. No redness or d/c  Musculoskeletal: She exhibits edema and tenderness.  Lymphadenopathy:    She has no cervical adenopathy.  Neurological: She is alert.  Skin: Skin is warm and dry. No rash noted.  Psychiatric: She has a normal mood and affect. Her behavior is normal.     Labs reviewed: Admission on 06/02/2015, Discharged on 06/10/2015  No results displayed because visit has over 200 results.    Admission on 05/27/2015, Discharged on 05/30/2015  No results displayed because visit has over 200 results.    Admission on 05/25/2015, Discharged on 05/25/2015  Component Date Value Ref Range Status  . WBC 05/25/2015 8.3  4.0 - 10.5 K/uL Final  . RBC 05/25/2015 3.73* 3.87 - 5.11 MIL/uL Final  . Hemoglobin 05/25/2015 12.2  12.0 - 15.0 g/dL Final  . HCT 05/25/2015 36.7  36.0 - 46.0 % Final  . MCV 05/25/2015 98.4  78.0 - 100.0 fL Final  . MCH 05/25/2015 32.7  26.0 - 34.0 pg Final  . MCHC 05/25/2015 33.2  30.0 - 36.0 g/dL Final  . RDW 05/25/2015 13.3  11.5 - 15.5 % Final  . Platelets 05/25/2015 210  150 - 400 K/uL Final  . Neutrophils Relative % 05/25/2015 87* 43 - 77 % Final  . Neutro Abs 05/25/2015 7.2  1.7 - 7.7 K/uL Final  . Lymphocytes Relative 05/25/2015 7* 12 - 46 % Final  . Lymphs Abs 05/25/2015 0.6* 0.7 - 4.0 K/uL Final  . Monocytes Relative 05/25/2015 6  3 - 12 % Final  . Monocytes Absolute 05/25/2015 0.5  0.1 - 1.0 K/uL Final  . Eosinophils Relative 05/25/2015 0  0 - 5 % Final  .  Eosinophils Absolute 05/25/2015 0.0  0.0 - 0.7 K/uL Final  . Basophils Relative 05/25/2015 0  0 - 1 % Final  . Basophils Absolute 05/25/2015 0.0  0.0 - 0.1 K/uL Final  . Sodium 05/25/2015 136  135 - 145 mmol/L Final  . Potassium 05/25/2015 3.4* 3.5 - 5.1 mmol/L Final  . Chloride 05/25/2015 107  101 - 111 mmol/L Final  . CO2 05/25/2015 22  22 - 32 mmol/L Final  . Glucose, Bld 05/25/2015 202* 65 - 99 mg/dL Final  . BUN 05/25/2015 10  6 - 20 mg/dL Final  . Creatinine, Ser 05/25/2015 0.58  0.44 - 1.00 mg/dL Final  . Calcium 05/25/2015 9.3  8.9 - 10.3 mg/dL Final  . Total Protein 05/25/2015 7.0  6.5 - 8.1 g/dL Final  . Albumin 05/25/2015 3.0* 3.5 - 5.0 g/dL Final  . AST 05/25/2015 1065* 15 - 41 U/L Final  . ALT 05/25/2015 493* 14 - 54 U/L Final  . Alkaline Phosphatase 05/25/2015 240* 38 - 126 U/L Final  . Total Bilirubin 05/25/2015  1.6* 0.3 - 1.2 mg/dL Final  . GFR calc non Af Amer 05/25/2015 >60  >60 mL/min Final  . GFR calc Af Amer 05/25/2015 >60  >60 mL/min Final   Comment: (NOTE) The eGFR has been calculated using the CKD EPI equation. This calculation has not been validated in all clinical situations. eGFR's persistently <60 mL/min signify possible Chronic Kidney Disease.   . Anion gap 05/25/2015 7  5 - 15 Final  . Lipase 05/25/2015 14* 22 - 51 U/L Final  . Troponin I 05/25/2015 <0.03  <0.031 ng/mL Final   Comment:        NO INDICATION OF MYOCARDIAL INJURY.   Admission on 04/25/2015, Discharged on 05/02/2015  No results displayed because visit has over 200 results.      Dg Cholangiogram Operative  06/06/2015   CLINICAL DATA:  Intraoperative cholangiogram for cholecystitis  EXAM: INTRAOPERATIVE CHOLANGIOGRAM  FLUOROSCOPY TIME:  9 seconds  COMPARISON:  Ultrasound fluoroscopic guided cholecystostomy tube placement - 04/26/2015; cholangiogram via existing cholecystostomy tube -06/03/2015; 05/25/2015; CT abdomen pelvis -06/02/2025  FINDINGS: Surgical clips overlie the expected location  of the neck of the gallbladder.  A cholecystostomy tube overlies expected location of the gallbladder fossa.  Contrast injection fails to opacify any identifiable portion of the biliary system extravasation of contrast about the gallbladder fossa.  IMPRESSION: Nondiagnostic cholangiogram as above. Further evaluation with ERCP could be performed as clinically indicated   Electronically Signed   By: Sandi Mariscal M.D.   On: 06/06/2015 12:01   Ct Abdomen Pelvis W Contrast  06/03/2015   CLINICAL DATA:  Epigastric abdominal pain. History of bloody drainage from cholecystostomy tube.  EXAM: CT ABDOMEN AND PELVIS WITH CONTRAST  TECHNIQUE: Multidetector CT imaging of the abdomen and pelvis was performed using the standard protocol following bolus administration of intravenous contrast.  CONTRAST:  140m OMNIPAQUE IOHEXOL 300 MG/ML  SOLN  COMPARISON:  Most recent CT 05/27/2015  FINDINGS: Minimal atelectasis at the left lung base.  Percutaneous cholecystostomy tube in place. Do intraluminal hyperdensities, 1 in the gallbladder neck, 1 adjacent to the cholecystostomy tube, may reflect gallstones. There is mild interval distention the gallbladder compared to prior CT. Unchanged degree of extrahepatic biliary prominence, the common bile duct measures 1.4 cm, the contents of the common bile duct appear increased in density compared to prior. Small fluid collection just anterior to the cholecystostomy tube in the liver is slightly larger than on prior exam, currently 3.3 x 2.5 x 4.4 cm, previously 2.3 x 3.0 x 4.5 cm. There is minimal surrounding soft tissue stranding. There is probable thrombosis of portal venous branches in the left lobe of the liver that appears similar to prior exam.  Fatty atrophy of the pancreas, unchanged from prior. Left adrenal hypertrophy unchanged, right adrenal gland is normal. Spleen is normal. Kidneys demonstrate symmetric enhancement and excretion on delayed phase imaging.  Stomach physiologically  distended. There are no dilated or thickened bowel loops. Moderate stool throughout the colon. Diverticulosis of the distal colon without diverticulitis. Sigmoid colon is tortuous. There is no free air or ascites.  Or unchanged aneurysm of the infrarenal abdominal aorta measuring up to 3.7 cm. There is a centric mural thrombus and diffuse atheromatous calcification. No retroperitoneal adenopathy.  Within the pelvis the bladder is physiologically distended. Uterus is surgically absent. Multiple pelvic phleboliths. No pelvic free fluid. No adnexal mass.  Scoliosis and degenerative change throughout the spine. There are no acute or suspicious osseous abnormalities.  IMPRESSION: 1. Cholecystostomy tube remains in  place. Unchanged biliary prominence, however the density in the common bile duct is increased compared to prior, which may reflect complex fluid, hemorrhage or infection. Alternatively, vicarious excretion of contrast related to recent prior CT is considered, but fell is likely. 2. Small fluid collection adjacent to the right lobe of the liver has minimally increased in size from prior exam, there is a small amount of surrounding inflammation. Hematoma or biloma again considered. Given the surrounding inflammation, superimposed infection is not excluded. 3. 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 4. Infrarenal abdominal aortic aneurysm. Follow-up ultrasound in 2 years recommended.   Electronically Signed   By: Jeb Levering M.D.   On: 06/03/2015 02:42   Ct Abdomen Pelvis W Contrast  05/27/2015   CLINICAL DATA:  Status post percutaneous cholecystostomy.  EXAM: CT ABDOMEN AND PELVIS WITH CONTRAST  TECHNIQUE: Multidetector CT imaging of the abdomen and pelvis was performed using the standard protocol following bolus administration of intravenous contrast.  CONTRAST:  100 cc of Omnipaque 300  COMPARISON:  04/25/2015  FINDINGS: Lower chest: Dependent  changes are noted within the posterior right lung base.  Hepatobiliary: A percutaneous cholecystostomy tube is identified and appears well suture rated within the gallbladder. The gallbladder appears decompressed. Small fluid collection ventral to the right hepatic lobe measures 2.3 x 3.0 x 4.5 cm, image 37/ series 2. This is mixed attenuation may represent a small biloma and/or hematoma. There is no significant biliary dilatation. 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 The hepatic veins appear patent.  Pancreas: Fatty replacement of the pancreas.  Spleen: Normal appearance of the spleen.  Adrenals/Urinary Tract: Left adrenal gland hypertrophy noted. Right adrenal gland appears normal. Unremarkable appearance of the kidneys. Urinary bladder appears normal.  Stomach/Bowel: The stomach is within normal limits. The small bowel loops have a normal course and caliber. No obstruction. Normal appearance of the colon.  Vascular/Lymphatic: Calcified atherosclerotic disease involves the abdominal aorta. There is an infrarenal abdominal aortic aneurysm which measures 3.7 cm, image 50/series 2. No enlarged retroperitoneal or mesenteric adenopathy. No enlarged pelvic or inguinal lymph nodes.  Reproductive: Previous hysterectomy.  No adnexal mass.  Other: None.  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  IMPRESSION: 1. Interval percutaneous cholecystostomy with decompression of the gallbladder. There is no evidence for biliary obstruction. 2. Small fluid collection ventral to the right lobe of liver is identified. This is mixed attenuation and may represent a small biloma and/or small hematoma. No large hematoma is identified at this time. 3. 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 4. Aortic atherosclerosis  5. Infrarenal abdominal aortic aneurysm. Recommend followup by ultrasound in 2 years. This recommendation follows ACR consensus guidelines: White Paper of the ACR Incidental Findings Committee II on Vascular Findings. J Am Coll Radiol 2013; 10:789-794. 6. Critical Value/emergent results were called by telephone at the time of interpretation on 05/27/2015 at 7:44 pm to Dr. Carmin Muskrat , who verbally acknowledged these results.   Electronically Signed   By: Kerby Moors M.D.   On: 05/27/2015 19:44   Ir Sinus/fist Tube Chk-non Gi  05/27/2015   CLINICAL DATA:  Hemobilia, status post cholecystostomy, tachycardia, diaphoresis  EXAM: Fluoroscopic injection of the existing cholecystostomy  Date:  6/21/20166/21/2016  3:38 pm  Radiologist:  M. Daryll Brod, MD  Guidance:  Fluoroscopic  FLUOROSCOPY TIME:  54 seconds, 50 mGy  MEDICATIONS AND MEDICAL HISTORY: None.  ANESTHESIA/SEDATION: None.  CONTRAST:  49m OMNIPAQUE IOHEXOL 300 MG/ML  SOLN  COMPLICATIONS: None immediate  PROCEDURE: Informed consent was obtained from the patient following explanation of the procedure, risks, benefits and alternatives. The patient understands, agrees and consents for the procedure. All questions were addressed. A time out was performed.  Under sterile conditions, the existing cholecystostomy was injected with contrast under fluoroscopy. Images obtained for documentation.  Cholangiogram: Stable position of the cholecystostomy tube within the gallbladder. Filling defects throughout the gallbladder could represent blood clots and/or stones. Cystic duct is patent. Contrast does reach the common hepatic duct. No communication to be any adjacent vascular structure to account for the hemobilia.  IMPRESSION: Cholecystostomy in good position.  Patent cystic duct.  Gallbladder diffuse filling defect compatible with blood clot versus cholelithiasis.   Electronically Signed   By: MJerilynn Mages  Shick M.D.   On: 05/27/2015 16:26   Ir Cm Inj Any Colonic Tube  W/fluoro  06/03/2015   CLINICAL DATA:  History of acute cholecystitis, post ultrasound fluoroscopic guided cholecystostomy tube placement on 04/26/2015.  Patient has been anticoagulated for concern of portal vein thrombosis involving the distal aspect of the intrahepatic portal venous system of the left lobe of the liver.  Since cholecystostomy tube placement, the patient has had persistent bloody output for which she underwent a fluoroscopic guided cholangiogram performed 05/25/19 16 which demonstrated multiple blood products within the gallbladder fossa.  Patient now with elevated LFTs with bloody output surrounding the cholecystostomy tube. Request made for repeat fluoroscopic guided cholangiogram peer  EXAM: GI TUBE INJECTION  COMPARISON:  CT abdomen pelvis - 06/03/2015; 05/27/2015; 04/25/2015; ultrasound fluoroscopic guided cholecystostomy tube placement - 04/26/2015; cholangiogram via existing cholecystostomy tube -05/25/2015  CONTRAST:  125mOMNIPAQUE IOHEXOL 300 MG/ML SOLN - administered via the existing cholecystostomy tube  FLUOROSCOPY TIME:  1 minutes, 18 seconds (92 mGy)  TECHNIQUE: The patient was positioned supine on the fluoroscopy table. A preprocedural spot fluoroscopic image was obtained of the right upper abdominal quadrant an existing percutaneous cholecystostomy tube.  Multiple spot fluoroscopic and radiographic images were obtained of the right upper abdominal quadrant following the injection of small amount of contrast via the existing cholecystostomy tube.  Images were reviewed and the procedure was terminated. Note, patient became nauseous following the injection of a small amount of contrast resulting in significant image degradation.  The cholecystostomy tube was secured in place with a new Stat Lock device.  The cholecystostomy tube was flushed with a small amount of saline and reconnected to the gravity bag.  FINDINGS: Preprocedural spot fluoroscopic image demonstrates unchanged  positioning of the cholecystostomy tube overlying the right upper abdominal quadrant.  Contrast injection demonstrates near complete replacement of the gallbladder lumen with ill-defined filling defects favored to represent blood products.  There is minimal opacification of the cystic duct which opacifies the common bile and hepatic ducts which likewise R field with occlusive debris favored to represent blood products. Note, the patient became nauseous following the injection of contrast via the cholecystostomy tube, resulting in significant image degradation.  IMPRESSION: 1. Appropriately positioned cholecystostomy tube with end coiled and locked within the gallbladder fundus. No exchange performed. 2. Percutaneous cholangiogram demonstrates complete opacification of the gallbladder lumen as well as the opacified portions of the cystic, common hepatic and common bile ducts with ill-defined filling defects favored to  represent blood products.  PLAN: Given the very minimal amount of residual largely nonocclusive thrombus within the central aspect of the left intrahepatic biliary system, would advise against continued aggressive anti coagulation.  If patient is liver function tests continued to rise, would consider ERCP for potential temporizing biliary stent placement.  If patient continues to experience bloody drainage surrounding the cholecystostomy tube, this could be up sized to a 12 [place] percutaneous drain, however would advise this procedure be performed with sedation secondary to patient's inability to tolerate today's percutaneous cholangiogram.  Above findings discussed with Randon Goldsmith, CCS PA, at the time of procedure completion.   Electronically Signed   By: Sandi Mariscal M.D.   On: 06/03/2015 17:08   Dg Chest Port 1 View  05/27/2015   CLINICAL DATA:  Abdominal pain and tachycardia.  EXAM: PORTABLE CHEST - 1 VIEW  COMPARISON:  05/25/2015  FINDINGS: Portable view of the chest demonstrates low lung volumes.  Difficult to exclude vascular congestion. Heart size is accentuated by the low lung volumes. Negative for pneumothorax.  IMPRESSION: Low lung volumes without focal disease. Difficult to exclude vascular congestion.   Electronically Signed   By: Markus Daft M.D.   On: 05/27/2015 17:54   Dg Chest Port 1 View  05/25/2015   CLINICAL DATA:  Acute chest pain.  EXAM: PORTABLE CHEST - 1 VIEW  COMPARISON:  Apr 25, 2015.  FINDINGS: Stable cardiomediastinal silhouette. No pneumothorax or pleural effusion is noted. Both lungs are clear. The visualized skeletal structures are unremarkable.  IMPRESSION: No acute cardiopulmonary abnormality seen.   Electronically Signed   By: Marijo Conception, M.D.   On: 05/25/2015 10:20   Ir Cholangiogram Existing Tube  05/27/2015   CLINICAL DATA:  Hemobilia. Examination of the drainage to for the cholecystostomy tube demonstrates predominately serosanguineous fluid and a few scattered very dark gold blood clots. There is no bright red blood in the drainage bag.  EXAM: CHOLANGIOGRAM VIA EXISTING CATHETER  PROCEDURE: Contrast was injected into the cholecystostomy tube and imaging was obtained.  FINDINGS: There are filling defects in the gallbladder and cystic duct compatible with thrombus. The cystic duct is patent.  IMPRESSION: The cystic duct is patent. Blood clots are present in the gallbladder. Recommendations are for drain capping and resuming bag drainage tomorrow.   Electronically Signed   By: Marybelle Killings M.D.   On: 05/27/2015 07:51     Assessment/Plan   ICD-9-CM ICD-10-CM   1. Biliary tract infection 576.1 K83.0   2. Paroxysmal atrial fibrillation (HCC) 427.31 I48.0   3. Chronic diastolic heart failure (HCC) 428.32 I50.32   4. Severe episode of recurrent major depressive disorder, without psychotic features ([place]) 296.33 F33.2   5. Generalized anxiety disorder 300.02 F41.1   6. Pulmonary emphysema, unspecified emphysema type ([place]) 492.8 J43.9   7. Insomnia 780.52 G47.00     8. Pain, joint, multiple sites 719.49 M25.50     complete abx as ordered  PT/OT/ST as ordered  Cont current meds as ordered  F/u with specialists as scheduled  Will follow  Monica S. Perlie Gold  [facility] [address] [place], [place] [address] [phone] Cell (Monday-Friday 8 AM - 5 PM) [phone] After 5 PM and follow prompts

## 2015-06-30 ENCOUNTER — Other Ambulatory Visit: Payer: Self-pay | Admitting: *Deleted

## 2015-06-30 MED ORDER — OXYCODONE HCL 5 MG PO TABS: ORAL_TABLET | ORAL | Status: DC

## 2015-06-30 NOTE — Telephone Encounter (Signed)
[facility] 

## 2015-07-01 ENCOUNTER — Telehealth: Payer: Self-pay | Admitting: Internal Medicine

## 2015-07-01 NOTE — Telephone Encounter (Signed)
Call to patient to confirm appointment for 07/02/15 at 1:45 phone is disconnected

## 2015-07-02 ENCOUNTER — Encounter: Payer: Self-pay | Admitting: Internal Medicine

## 2015-07-02 ENCOUNTER — Ambulatory Visit (INDEPENDENT_AMBULATORY_CARE_PROVIDER_SITE_OTHER): Payer: Medicare HMO | Admitting: Internal Medicine

## 2015-07-02 VITALS — BP 122/69 | HR 83 | Temp 98.2°F | Wt 222.2 lb

## 2015-07-02 DIAGNOSIS — J439 Emphysema, unspecified: Secondary | ICD-10-CM

## 2015-07-02 DIAGNOSIS — R600 Localized edema: Secondary | ICD-10-CM

## 2015-07-02 DIAGNOSIS — I4891 Unspecified atrial fibrillation: Secondary | ICD-10-CM

## 2015-07-02 DIAGNOSIS — J449 Chronic obstructive pulmonary disease, unspecified: Secondary | ICD-10-CM

## 2015-07-02 DIAGNOSIS — I10 Essential (primary) hypertension: Secondary | ICD-10-CM

## 2015-07-02 DIAGNOSIS — Z9049 Acquired absence of other specified parts of digestive tract: Secondary | ICD-10-CM

## 2015-07-02 DIAGNOSIS — I81 Portal vein thrombosis: Secondary | ICD-10-CM

## 2015-07-02 MED ORDER — TRIAMTERENE-HCTZ 37.5-25 MG PO CAPS: 1.0000 | ORAL_CAPSULE | Freq: Every day | ORAL | Status: DC

## 2015-07-02 NOTE — Assessment & Plan Note (Signed)
Patient is s/p laparoscopic cholecystectomy on 06/06/2015 and has been released by her surgeon. She is currently at [facility] for short term stay with goal to return to home. Patient states that she is back to baseline and feeling well. Surgical wounds are clean and well healed. She has no abdominal tenderness, constipation, or diarrhea. Her antibiotic course via right arm PICC line of Vanco and Zosyn are completed on 7/16 and 7/12 respectively.  -Remove PICC line today

## 2015-07-02 NOTE — Assessment & Plan Note (Signed)
Patient had episode of atrial fibrillation with RVR during hospital stay which delayed her cholecystectomy surgery. She also was found to have a portal vein thrombosis. She was started on Xeralto 15 mg BID initially that is to be changed to 20 mg once daily on 07/02/2015. She is also recently started on Amiodarone.  Discussed with Dr. Beryle Beams that there be some concern of interaction of Amiodarone with Xarelto as it is a CYP34A inhibitor. After review, we did not see a listed major interaction.  -Continue medications as per cardiology

## 2015-07-02 NOTE — Assessment & Plan Note (Signed)
Patient has been on HCTZ 12.5 mg daily and nifedipine 90 mg daily. She has been off of HCTZ for 2 weeks and is now complaining of increased bilateral leg swelling. Her daughter states that her legs are noticeably larger since she stopped the hydrochlorothiazide which was preventing her swelling. She also states that HCTZ caused her potassium levels to drop in the past and needed to take potassium supplementation. Patient states that she has been on Lasix in the past but did not tolerate due excessive urination.  BP Readings from Last 3 Encounters:  07/02/15 122/69  06/19/15 146/79  06/13/15 127/69   Patient's blood pressure is 122/79 today.  -Will start low dose triamterene-hydrochlorothiazide 37.5-25 mg once daily and observe -follow up in 4 weeks

## 2015-07-02 NOTE — Assessment & Plan Note (Signed)
Patient is recently started on Amiodarone after episode of Atrial fibrillation with RVR. Will need to test lung function due to side effect of Amiodarone.  -Referral for PFTs

## 2015-07-02 NOTE — Progress Notes (Signed)
Per MD order (outpatient clinic physician), PICC line removed. Cath intact at 43 cm. Vaseline pressure gauze to site, pressure held x 46min. No bleeding to site. Pt instructed to keep dressing CDI x 24 hours. Avoid heavy lifting, pushing or pulling x 24 hours,  If bleeding occurs hold pressure, if bleeding does not stop contact MD or go to the ED. Questions addressed with pt, states she understands the aforementioned instructions. Catalina Pizza

## 2015-07-02 NOTE — Progress Notes (Signed)
Medicine attending: I personally interviewed and briefly examined this patient, and reviewed pertinent clinical laboratory and radiographic data  with resident physician Dr.Vishal Posey Pronto and we discussed a   management plan. Elderly woman with recent complicated course. Acute cholecystitis. Surgery delayed due to atrial arrhythmias. Ultimately, lap cholecystectomy. Subsequent Portal vein thrombosis & atrial fibrillation. Now on Xarelto anticoagulation and amiodarone. Although amiodarone is a CYP34A & p glycoprotein inhibitor, it is not listed as having major interactions with Xarelto.  Surgical wounds well healed. Her antibiotic course is complete. We will have her PICC cath removed today.

## 2015-07-02 NOTE — Assessment & Plan Note (Signed)
Patient has been on HCTZ 12.5 mg daily and nifedipine 90 mg daily. She has been off of HCTZ for 2 weeks and is now complaining of increased bilateral leg swelling. Her daughter states that her legs are noticeably larger since she stopped the hydrochlorothiazide which was preventing her swelling. She also states that HCTZ caused her potassium levels to drop in the past and needed to take potassium supplementation. Patient states that she has been on Lasix in the past but did not tolerate due excessive urination. Will try a combination potassium sparing diuretic with HCTZ.  -Will start low dose triamterene-hydrochlorothiazide 37.5-25 mg once daily and observe.  -follow up in 4 weeks

## 2015-07-02 NOTE — Progress Notes (Signed)
Patient ID: Jennifer [name], female   DOB: [birth year], 74 y.o.   MRN: [id number]   Subjective:   Patient ID: Jennifer [name] female   DOB: 10/18/1941 74 y.o.   MRN: [id number]  HPI: [name] is a 74 y.o. female with PMH as listed below who presents as follow up after laparoscopic cholecystectomy. She also has complaints of bilateral leg swelling. She is accompanied by her daughter who provides additional history.  Patient states that she is doing well, back to her baseline before her acute cholecystitis. She is currently at [facility] for short term stay and goal of returning to home.  Please see problem list for details.   Past Medical History  Diagnosis Date  . COPD (chronic obstructive pulmonary disease)     O2 dependent. Followed by Dr. Joya Gaskins  . Fibromyalgia   . Hypertension   . OSA (obstructive sleep apnea)     Sleep study done April 02, 2008 showed severe obstructive sleep apnea.  . Diastolic dysfunction     Grade I diastolic dysfunction as shown on ECHO Dec 2011.  EF of 65-70%.  Marland Kitchen COPD (chronic obstructive pulmonary disease)     Followed by Dr. Joya Gaskins  . Dyslipidemia   . Osteopenia     DXA scan done on 10/23/2009 showed osteopenia of AP spine (young adult T-score -1.3), osteopenia of left femur neck (young adult T-score -1.8), and normal bone density of right femur neck (young adult T-score -0.8), with a FRAX estimate of the 10-year probability of a major osteoporotic fracture of 9.7% and probability of hip fracture 1.6%.  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 Estanislado Pandy on 11/10/2004.  MRA of the head on 03/09/2012 showed no evidence for recanalization.  . Osteoarthrosis involving multiple sites   . Chronic back pain   . Depression   . Anxiety   . Anemia   . GERD (gastroesophageal reflux disease)   . Meniere disease     Followed by ENT Dr. Silvestre Moment  . Diverticulosis of colon   .  Multiple pigmented nevi   . Hypokalemia   . S/P hysterectomy 1975  . Pneumonia 11/2004  . Knee pain   . Dysphagia   . Urosepsis 11/04/2010    Klebsiella pneumoniae  . Breast pain   . Allergic rhinitis   . Fibromyalgia   . 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     . Urinary incontinence    Current Outpatient Prescriptions  Medication Sig Dispense Refill  . albuterol (PROAIR HFA) 108 (90 BASE) MCG/ACT inhaler INHALE 2 PUFFS BY MOUTH INTO LUNGS FOUR TIMES DAILY AS NEEDED (Patient taking differently: Inhale 2 puffs into the lungs every 6 (six) hours as needed for wheezing or shortness of breath. ) 3 Inhaler 6  . ALPRAZolam (XANAX) 0.5 MG tablet Take 1 tablet (0.5 mg total) by mouth 3 (three) times daily as needed for anxiety. 90 tablet 2  . amiodarone (PACERONE) 200 MG tablet Take 1 tablet (200 mg total) by mouth 2 (two) times daily. 60 tablet 0  . amitriptyline (ELAVIL) 50 MG tablet Take 1 tablet (50 mg total) by mouth at bedtime. 30 tablet 3  . aspirin 81 MG EC tablet Take  81 mg by mouth daily.      . Cholecalciferol (VITAMIN D3) 1000 UNITS CAPS Take 1 capsule (1,000 Units total) by mouth daily. 30 capsule 2  . docusate sodium (COLACE) 100 MG capsule Take 100 mg by mouth daily as needed for mild constipation or moderate constipation.    . Fluticasone-Salmeterol (ADVAIR DISKUS) 250-50 MCG/DOSE AEPB Inhale 1 puff into the lungs 2 (two) times daily. 180 each 4  . hydroxypropyl methylcellulose (ISOPTO TEARS) 2.5 % ophthalmic solution Place 2 drops into both eyes 3 (three) times daily as needed. Dry eye    . meclizine (ANTIVERT) 12.5 MG tablet Take 1 tablet (12.5 mg total) by mouth 2 (two) times daily as needed for dizziness. 180 tablet 0  . Multiple Vitamin  (MULTIVITAMIN) tablet Take 1 tablet by mouth daily.    Marland Kitchen NIFEdipine (ADALAT CC) 90 MG 24 hr tablet Take 1 tablet (90 mg total) by mouth daily. 90 tablet 2  . omeprazole (PRILOSEC) 20 MG capsule Take 1 capsule (20 mg total) by mouth 2 (two) times daily. 180 capsule 3  . oxyCODONE (OXY IR/ROXICODONE) 5 MG immediate release tablet Take one tablet by mouth every 4 hours as needed for pain 180 tablet 0  . piperacillin-tazobactam (ZOSYN) 3.375 GM/50ML IVPB Inject 50 mLs (3.375 g total) into the vein every 8 (eight) hours. 50 mL   . Red Yeast Rice Extract (RED YEAST RICE PO) Take 1 tablet by mouth 2 (two) times daily.     . Rivaroxaban (XARELTO) 15 MG TABS tablet Take 1 tablet (15 mg total) by mouth 2 (two) times daily with a meal. 42 tablet 0  . rivaroxaban (XARELTO) 20 MG TABS tablet Take 1 tablet (20 mg total) by mouth daily with supper. 30 tablet 0  . senna-docusate (SENOKOT-S) 8.6-50 MG per tablet Take 1 tablet by mouth 2 (two) times daily.    . sertraline (ZOLOFT) 100 MG tablet Take 1 tablet (100 mg total) by mouth 2 (two) times daily. 180 tablet 1  . tiotropium (SPIRIVA HANDIHALER) 18 MCG inhalation capsule Place 1 capsule (18 mcg total) into inhaler and inhale daily. 90 capsule 4  . topiramate (TOPAMAX) 50 MG tablet Take 1 tablet (50 mg total) by mouth 2 (two) times daily. 60 tablet 2  . triamterene-hydrochlorothiazide (DYAZIDE) 37.5-25 MG per capsule Take 1 each (1 capsule total) by mouth daily. 30 capsule 3  . vancomycin (VANCOCIN) 1 GM/200ML SOLN Inject 200 mLs (1,000 mg total) into the vein every 12 (twelve) hours. 4000 mL    No current facility-administered medications for this visit.   Family History  Problem Relation Age of Onset  . Ulcers Mother   . Ovarian cancer Sister 41  . Cervical cancer Daughter   . Cervical cancer Daughter   . Lung cancer Neg Hx   . Colon cancer Neg Hx   . Heart attack Father   . Cerebral aneurysm Sister 51   History   Social History  . Marital Status:  Widowed    Spouse Name: N/A  . Number of Children: N/A  . Years of Education: N/A   Social History Main Topics  . Smoking status: Former Smoker -- 0.50 packs/day for 30 years    Types: Cigarettes    Quit date: 12/07/2008  . Smokeless tobacco: Former Systems developer    Types: [place] date: 12/06/1978  . Alcohol Use: No  . Drug Use: No  . Sexual Activity: No   Other Topics Concern  . None  Social History Narrative   Financial assistance approved for 100% discount after Medicare pays for [facility] only, not eligible for [facility] card. Per Bonna Gains   Review of Systems: Review of Systems  Constitutional: Negative for fever and chills.  Respiratory: Negative for cough, shortness of breath and wheezing.   Cardiovascular: Positive for leg swelling. Negative for chest pain and palpitations.  Gastrointestinal: Negative for nausea, vomiting, abdominal pain, diarrhea and constipation.  Genitourinary: Negative for dysuria.  Musculoskeletal: Negative for falls.  Skin: Negative for itching and rash.  Neurological: Negative for dizziness and headaches.    Objective:  Physical Exam: Filed Vitals:   07/02/15 1429  BP: 122/69  Pulse: 83  Temp: 98.2 F (36.8 C)  TempSrc: Oral  Weight: 222 lb 3.2 oz (100.789 kg)  SpO2: 99%   Physical Exam  Constitutional: She is oriented to person, place, and time. She appears well-developed and well-nourished.  HENT:  Head: Normocephalic and atraumatic.  Cardiovascular: Normal rate and regular rhythm.   Pulmonary/Chest: Effort normal and breath sounds normal.  Abdominal: Soft. She exhibits no distension. There is no tenderness.  Musculoskeletal: She exhibits edema.  Bilateral lower extremity edema.  Neurological: She is alert and oriented to person, place, and time.  Skin: Skin is warm.  Psychiatric: She has a normal mood and affect.    Assessment & Plan:  Please see problem based charting for current assessment and plan.

## 2015-07-02 NOTE — Patient Instructions (Addendum)
Thank you for your visit.  For your leg swelling we will start you on Triamterene-Hydrochlorothiazide 37.5-25 mg. We hope that this will help retain potassium.  We are taking your PICC line out today.  I am referring you for lung testing, as the medication you are taking for your heart (Amiodarone) can sometimes have a side effect that affect your lungs.  I have a few samples of Benadryl, which you may use as needed for your itching.

## 2015-07-04 ENCOUNTER — Non-Acute Institutional Stay (SKILLED_NURSING_FACILITY): Payer: Medicare HMO | Admitting: Adult Health

## 2015-07-04 DIAGNOSIS — I81 Portal vein thrombosis: Secondary | ICD-10-CM | POA: Diagnosis not present

## 2015-07-04 DIAGNOSIS — J439 Emphysema, unspecified: Secondary | ICD-10-CM

## 2015-07-04 DIAGNOSIS — I480 Paroxysmal atrial fibrillation: Secondary | ICD-10-CM

## 2015-07-07 DIAGNOSIS — I509 Heart failure, unspecified: Secondary | ICD-10-CM | POA: Diagnosis not present

## 2015-07-07 DIAGNOSIS — R2689 Other abnormalities of gait and mobility: Secondary | ICD-10-CM | POA: Diagnosis not present

## 2015-07-07 DIAGNOSIS — J449 Chronic obstructive pulmonary disease, unspecified: Secondary | ICD-10-CM | POA: Diagnosis not present

## 2015-07-07 DIAGNOSIS — K819 Cholecystitis, unspecified: Secondary | ICD-10-CM | POA: Diagnosis not present

## 2015-07-15 ENCOUNTER — Other Ambulatory Visit: Payer: Self-pay | Admitting: Student in an Organized Health Care Education/Training Program

## 2015-07-15 ENCOUNTER — Telehealth: Payer: Self-pay | Admitting: *Deleted

## 2015-07-15 NOTE — Telephone Encounter (Signed)
I am going to decline the oxycodone refill for now. Dr. Posey Pronto here at [facility] gave the patient a paper refill for #180 tablets on 7/25 which has not yet been filled. According to the database, the patient filled Oxycodone #30 on 7/30 prescribed by Ok Edwards NP. Please ask the patient's daughter if they still have Dr. Serita Grit prescription around. Ideally I would like to see the patient in office for a follow up next Monday if they can, and we can figure out the situation together.

## 2015-07-15 NOTE — Telephone Encounter (Signed)
Pt called requesting pain meds to be filled. °

## 2015-07-15 NOTE — Telephone Encounter (Signed)
Daughter states pt only got # 30 Pharmacy called and refill for #  30 on July 30   I see a Rx for Oxy on 7/25 # 180 ??

## 2015-07-15 NOTE — Telephone Encounter (Signed)
Patient daughter, Rise Paganini called and stated that patient needs a Rx for Oxycodone. Explained to her that we could not write the Rx until she establishes with our office. She agreed.

## 2015-07-16 NOTE — Telephone Encounter (Signed)
Patients daughter is calling back about rx.

## 2015-07-16 NOTE — Telephone Encounter (Signed)
Talked  With pt's daughter and she states pt never received a Rx for # 180.  She states pt is out of pain meds now. I did schedule an appointment for Monday at 9:45 in clinic. The Rx for Oxy # 180 was written by Dr  Mariea Clonts

## 2015-07-16 NOTE — Telephone Encounter (Signed)
Call returned, no answer. Message left.

## 2015-07-16 NOTE — Telephone Encounter (Signed)
Pt daughter is calling back about Rx.

## 2015-07-17 ENCOUNTER — Other Ambulatory Visit: Payer: Self-pay | Admitting: *Deleted

## 2015-07-17 MED ORDER — OXYCODONE HCL 5 MG PO TABS: ORAL_TABLET | ORAL | Status: DC

## 2015-07-17 NOTE — Telephone Encounter (Signed)
The charge nurse at [facility] called and stated that pt was given some scripts when disch from [facility], alprazolam #30 0.5mg , she will pull the copy of the script and check to see if there was a refill also pt was only given a script for #30 of oxycodone 5mg  but in EPIC it shows a script for #180 from dr Jonelle Sidle reed do for [facility].

## 2015-07-17 NOTE — Telephone Encounter (Signed)
rec'd call from chg nurse again #180 script traced to dr reed at [facility], it was filled by [facility] pharm in [place]. And sent to facility, pt nor family ever had access to med. Alprazolam did have 1 refill and was refilled appr 10 days after first fill. Dr reed may write for more of the pain med she has not responded as of yet RN will call [facility] back

## 2015-07-17 NOTE — Telephone Encounter (Signed)
Patients daughter calling again states that she contacted the pharmacy and they told her they only have a rx for 30 not 180. Wants to talk to the nurse.

## 2015-07-17 NOTE — Telephone Encounter (Signed)
I talked with daughter again and asked her to bring the bottle of pain pills with her to appointment on Monday. She agrees.

## 2015-07-17 NOTE — Telephone Encounter (Signed)
Abigail Butts called from [facility] and stated that patient was out of her pain medication and would like to know if Dr. Mariea Clonts would prescribe enough to get her through till Monday. Dr. Mariea Clonts approved and Rx printed and Abigail Butts at [facility] notified and she will call patient to come by and pick up Rx.

## 2015-07-21 ENCOUNTER — Ambulatory Visit (INDEPENDENT_AMBULATORY_CARE_PROVIDER_SITE_OTHER): Payer: Medicare HMO | Admitting: Student in an Organized Health Care Education/Training Program

## 2015-07-21 ENCOUNTER — Ambulatory Visit (HOSPITAL_COMMUNITY)
Admission: RE | Admit: 2015-07-21 | Discharge: 2015-07-21 | Disposition: A | Discharge status: Home / Self Care | Payer: Medicare HMO | Source: Ambulatory Visit | Attending: Student in an Organized Health Care Education/Training Program | Admitting: Student in an Organized Health Care Education/Training Program

## 2015-07-21 ENCOUNTER — Encounter: Payer: Self-pay | Admitting: Student in an Organized Health Care Education/Training Program

## 2015-07-21 VITALS — BP 101/67 | HR 78 | Temp 98.0°F | Wt 209.8 lb

## 2015-07-21 DIAGNOSIS — I480 Paroxysmal atrial fibrillation: Secondary | ICD-10-CM

## 2015-07-21 DIAGNOSIS — Z9181 History of falling: Secondary | ICD-10-CM | POA: Insufficient documentation

## 2015-07-21 DIAGNOSIS — I10 Essential (primary) hypertension: Secondary | ICD-10-CM | POA: Diagnosis not present

## 2015-07-21 DIAGNOSIS — J439 Emphysema, unspecified: Secondary | ICD-10-CM

## 2015-07-21 DIAGNOSIS — M159 Polyosteoarthritis, unspecified: Secondary | ICD-10-CM | POA: Diagnosis not present

## 2015-07-21 DIAGNOSIS — R2681 Unsteadiness on feet: Secondary | ICD-10-CM

## 2015-07-21 DIAGNOSIS — Z7901 Long term (current) use of anticoagulants: Secondary | ICD-10-CM

## 2015-07-21 DIAGNOSIS — R9431 Abnormal electrocardiogram [ECG] [EKG]: Secondary | ICD-10-CM | POA: Insufficient documentation

## 2015-07-21 DIAGNOSIS — F411 Generalized anxiety disorder: Secondary | ICD-10-CM

## 2015-07-21 DIAGNOSIS — I714 Abdominal aortic aneurysm, without rupture, unspecified: Secondary | ICD-10-CM

## 2015-07-21 MED ORDER — OXYCODONE HCL 5 MG PO TABS: ORAL_TABLET | ORAL | Status: DC

## 2015-07-21 MED ORDER — ALPRAZOLAM 0.5 MG PO TABS: 0.5000 mg | ORAL_TABLET | Freq: Three times a day (TID) | ORAL | Status: DC | PRN

## 2015-07-21 MED ORDER — SENNOSIDES-DOCUSATE SODIUM 8.6-50 MG PO TABS: 2.0000 | ORAL_TABLET | Freq: Every day | ORAL | Status: AC | PRN

## 2015-07-21 NOTE — Assessment & Plan Note (Addendum)
Symptomatically stable with no palpitations or other symptoms at home. I think this was likely exacerbated by her acute illness. ECG completed in clinic and I reviewed personally shows normal sinus rhythm. I will discontinue the amiodarone at this point as I think she will be rate controlled on nifedipine alone given that she has completely recovered from the last episode of cholecystitis. We can restart amiodarone in the future if we need to, but I would prefer to avoid it given her COPD and poor pulmonary function at baseline. We will continue anticoagulation and stroke prevention with Rivaroxaban 20 mg daily.

## 2015-07-21 NOTE — Progress Notes (Signed)
   See Encounters tab for problem-based medical decision making  __________________________________________________________  HPI:  Patient is a 74 year old woman who comes in today for all up of atrial fibrillation and chronic pain. She was hospitalized in June because of acute cholecystitis, at that time found to have uncontrolled atrial fibrillation with rapid ventricular response. She was loaded with amiodarone, had a laparoscopic cholecystectomy, and discharged with 2 weeks of IV antibiotics with vancomycin and Zosyn which she has completed. Her peripherally inserted central catheter has been removed. She spent a couple weeks at a skilled nursing facility and has recently transitioned back to her home with her daughter about 2 weeks ago. She reports things are going very well at home. She is doing physical therapy and reports improving strength. She currently is completes all of her activities of daily living including toileting and bathing independently. Denies any sensation of palpitations or chest pain. Reports that her breathing is stable with no productive cough. Reports good compliance with her current medications without any notable side effects. She still does use oxycodone about 3-4 times daily. Having moderate constipation with one bowel movement every 2-3 days with some amount of straining.  Last clinic visit she was also started on triamterene because of chronic hypokalemia. This was added to her thiazide diuretic. She reports good compliance at home. Denies any lightheadedness when standing. No muscle cramps. He and drinking well. Reports that her weight is stable. No fevers or chills. Abdomen is pain-free. She had one episode of emesis couple days ago while she was straining to have a bowel movement. Denies any recent falls, though she does endorse feeling very unsteady on her feet.   __________________________________________________________  Problem List: Patient Active Problem  List   Diagnosis Date Noted  . At high risk for falls 07/21/2015  . Prediabetes 06/03/2015  . Abdominal aortic aneurysm 05/29/2015  . Portal vein thrombosis 05/28/2015  . Paroxysmal atrial fibrillation   . Vitamin D deficiency 01/23/2015  . Preventative health care 09/11/2013  . Fibromyalgia 04/20/2012  . Incontinence of urine 04/19/2012  . Hand eczema 04/19/2012  . Cerebral ventriculomegaly 04/19/2012  . Diastolic heart failure 36/14/4315  . ALLERGIC RHINITIS 10/28/2010  . Osteopenia 10/23/2009  . DIVERTICULOSIS OF COLON 07/17/2008  . Gastroesophageal reflux disease 04/19/2008  . Obstructive sleep apnea 04/17/2008  . Meniere's disease 03/23/2007  . CEREBRAL ANEURYSM 03/23/2007  . Generalized osteoarthrosis 01/13/2007  . ANXIETY DISORDER, GENERALIZED 12/29/2006  . Depression 12/29/2006  . Essential hypertension 12/29/2006  . COPD with emphysema 12/29/2006    Medications: Reconciled today in Epic __________________________________________________________  Physical Exam:  Vital Signs: Filed Vitals:   07/21/15 0937  BP: 101/67  Pulse: 78  Temp: 98 F (36.7 C)  TempSrc: Oral  Weight: 209 lb 12.8 oz (95.165 kg)  SpO2: 100%    Gen: Chronically ill appearing, NAD ENT: OP clear without erythema or exudate, upper dentures in place Neck: No cervical LAD, No thyromegaly or nodules, No JVD. CV: RRR, no murmurs Pulm: Normal effort, CTA throughout, no wheezing Abd: Soft, NT, ND, normal BS, well healing surgical scars Ext: Warm, no edema, bilateral knees with moderate crepitus, no effusions. Skin: No atypical appearing moles. No rashes

## 2015-07-21 NOTE — Assessment & Plan Note (Signed)
Patient with long-standing chronic pain and anxiety for which she has been using combination low-dose narcotic and low-dose benzodiazepine with some good effect. Currently still reports having pain in both of her knees, hips which are improved with the use of low-dose narcotic. She continues to have good function at home and completes all her activities of daily living independently. I think given the patient's comorbidities and age, the narcotic is not a good long-term solution. I think she is a very high risk for falls and subsequent fracture. Our plan is get a diabetes slowly taper down her use of narcotics over the next 1-2 years. Ideally we'll also be able to discontinue the use of benzodiazepine. Patient understands that she is going to get her narcotic prescription from me. Patient understands the plan and is willing to slowly taper with the goal of lowering her fall risk. - Prescribed oxycodone 5 mg #120, 3-4 tabs daily, 1 refill for 2 month supply - Urine toxicology obtained today - Reviewed narcotic database and shows appropriate utilization

## 2015-07-21 NOTE — Assessment & Plan Note (Signed)
She appears very unsteady on her feet today. Has a shuffling gaits. Given combination of benzo and narcotic pain medications I think she is high risk for falling. Will try to limit other centrally acting medicines in the future. We came up with a plan to taper down the benzo and narcotic over the next 1-2 years. I also prescribed her a Rollator walker. She continues to work with physical therapy in her home.

## 2015-07-21 NOTE — Patient Instructions (Addendum)
1. I ordered a rolling walker to help lower your risk of falling. You should hear from an equipment company about deliver in the next few weeks. Call our office if you do not.   2. Your blood pressure may be getting a little too low at home on the potassium sparing diuretic. Call me if you are experiencing light headedness when standing and we will adjust this medicine.   3. Stop taking amiodarone for now. This will wash out of your system over the next several weeks. Call me if you notice a fast racing heart beat and we will start a different type of medicine to control your heart rate.   4. Start taking Senna, two tabs every day to have at least one bowel movement every 1-2 days.   5. We will work together over the next several months to make a plan to bring down your doses of narcotic and xanax in order to lower your risk of falling as you get older.

## 2015-07-21 NOTE — Assessment & Plan Note (Signed)
Blood pressure is very well controlled. She recently added on triamterene to her thiazide diuretic because of a history of hypokalemia. Blood pressure is lower today than previous visits. From a little worried she might become orthostatic hypotensive on this regimen at home, though she does not elicit any symptoms of this currently. Asked her daughter to check her blood pressure occasionally at home and call me if she has any systolic readings under 599. If she is getting hypotensive at home is only going to increase her fall risk. We'll check a potassium today.

## 2015-07-21 NOTE — Assessment & Plan Note (Signed)
Infrarenal abdominal aortic aneurysm seen on CT scan in June 2016 measuring up to 3.7 cm. Based on this she should have a follow-up ultrasound in 2 years. We'll continue blood pressure control.

## 2015-07-22 ENCOUNTER — Telehealth: Payer: Self-pay | Admitting: Student in an Organized Health Care Education/Training Program

## 2015-07-22 LAB — BASIC METABOLIC PANEL
BUN/Creatinine Ratio: 14 (ref 11–26)
BUN: 17 mg/dL (ref 8–27)
CALCIUM: 10.5 mg/dL — AB (ref 8.7–10.3)
CO2: 20 mmol/L (ref 18–29)
Chloride: 97 mmol/L (ref 97–108)
Creatinine, Ser: 1.25 mg/dL — ABNORMAL HIGH (ref 0.57–1.00)
GFR, EST AFRICAN AMERICAN: 48 mL/min/{1.73_m2} — AB (ref >59)
GFR, EST NON AFRICAN AMERICAN: 42 mL/min/{1.73_m2} — AB (ref >59)
Glucose: 116 mg/dL — ABNORMAL HIGH (ref 65–99)
Potassium: 3.4 mmol/L — ABNORMAL LOW (ref 3.5–5.2)
Sodium: 139 mmol/L (ref 134–144)

## 2015-07-22 MED ORDER — HYDROCHLOROTHIAZIDE 25 MG PO TABS: 12.5000 mg | ORAL_TABLET | Freq: Every day | ORAL | Status: DC

## 2015-07-22 MED ORDER — POTASSIUM CHLORIDE ER 10 MEQ PO TBCR: 20.0000 meq | EXTENDED_RELEASE_TABLET | Freq: Two times a day (BID) | ORAL | Status: DC

## 2015-07-22 NOTE — Telephone Encounter (Signed)
Labs show moderate increase in creatinine since starting Triamterene. I called Be[place] (daughter) and advised switching back to HCTZ 12.5mg  daily and I added supplemental potassium chloride 81meq daily.

## 2015-07-28 LAB — OXYCODONE/OXYMORPHONE CONFIRM
OXYCODONE/OXYMORPH: POSITIVE — AB
OXYCODONE: 553 ng/mL
OXYCODONE: POSITIVE — AB
OXYMORPHONE CONFIRM: 183 ng/mL
OXYMORPHONE: POSITIVE — AB

## 2015-07-28 LAB — PRESCRIPTION ABUSE MONITORING 17P, URINE
6-Acetylmorphine, Urine: NEGATIVE ng/mL
Amphetamine Scrn, Ur: NEGATIVE ng/mL
BARBITURATE SCREEN URINE: NEGATIVE ng/mL
Buprenorphine, Urine: NEGATIVE ng/mL
CANNABINOIDS UR QL SCN: NEGATIVE ng/mL
Carisoprodol/Meprobamate, Ur: NEGATIVE ng/mL
Cocaine (Metab) Scrn, Ur: NEGATIVE ng/mL
Creatinine(Crt), U: 83.7 mg/dL (ref 20.0–300.0)
EDDP, Urine: NEGATIVE ng/mL
FENTANYL, URINE: NEGATIVE pg/mL
MDMA Screen, Urine: NEGATIVE ng/mL
MEPERIDINE SCREEN, URINE: NEGATIVE ng/mL
Methadone Screen, Urine: NEGATIVE ng/mL
Nitrite Urine, Quantitative: NEGATIVE ug/mL
Opiate Scrn, Ur: NEGATIVE ng/mL
PH UR, DRUG SCRN: 7.1 (ref 4.5–8.9)
PROPOXYPHENE SCREEN URINE: NEGATIVE ng/mL
Phencyclidine Qn, Ur: NEGATIVE ng/mL
SPECIFIC GRAVITY: 1.01
Tapentadol, Urine: NEGATIVE ng/mL
Tramadol Screen, Urine: NEGATIVE ng/mL

## 2015-07-28 LAB — BENZODIAZEPINES CONFIRM, URINE
ALPRAZOLAM CONFIRM: 116 ng/mL
ALPRAZOLAM: POSITIVE — AB
BENZODIAZEPINES: POSITIVE ng/mL — AB
CLONAZEPAM: NEGATIVE
Flurazepam: NEGATIVE
LORAZEPAM: NEGATIVE
MIDAZOLAM: NEGATIVE
NORDIAZEPAM: NEGATIVE
OXAZEPAM UR: NEGATIVE
TEMAZEPAM: NEGATIVE
TRIAZOLAM: NEGATIVE

## 2015-08-18 ENCOUNTER — Ambulatory Visit: Payer: Medicare HMO | Admitting: Student in an Organized Health Care Education/Training Program

## 2015-08-21 ENCOUNTER — Ambulatory Visit: Payer: Medicare HMO | Admitting: Nurse Practitioner

## 2015-08-31 ENCOUNTER — Other Ambulatory Visit: Payer: Self-pay | Admitting: Adult Health

## 2015-09-15 ENCOUNTER — Other Ambulatory Visit: Payer: Self-pay | Admitting: *Deleted

## 2015-09-15 ENCOUNTER — Ambulatory Visit: Payer: Medicare HMO | Admitting: Student in an Organized Health Care Education/Training Program

## 2015-09-15 DIAGNOSIS — K219 Gastro-esophageal reflux disease without esophagitis: Secondary | ICD-10-CM

## 2015-09-15 MED ORDER — ALPRAZOLAM 0.5 MG PO TABS: 0.5000 mg | ORAL_TABLET | Freq: Three times a day (TID) | ORAL | Status: DC | PRN

## 2015-09-15 MED ORDER — OMEPRAZOLE 20 MG PO CPDR: 20.0000 mg | DELAYED_RELEASE_CAPSULE | Freq: Two times a day (BID) | ORAL | Status: DC

## 2015-09-15 MED ORDER — AMITRIPTYLINE HCL 50 MG PO TABS: 50.0000 mg | ORAL_TABLET | Freq: Every day | ORAL | Status: DC

## 2015-09-15 MED ORDER — OXYCODONE HCL 5 MG PO TABS: ORAL_TABLET | ORAL | Status: DC

## 2015-09-15 NOTE — Telephone Encounter (Signed)
E-mail message sent to daughter  That 2 Rx were sent to pharmacy and other 2 Rx are ready for pickup in clinic.

## 2015-09-15 NOTE — Telephone Encounter (Signed)
Patient missed her appointment with me today for medication refills. Last seen by me two months ago. I will provide a one month refill on oxycodone and alprazolam, but she should follow up with me in one month before receiving any further centrally acting medications.

## 2015-10-02 ENCOUNTER — Other Ambulatory Visit: Payer: Self-pay | Admitting: Adult Health

## 2015-10-06 ENCOUNTER — Encounter: Payer: Self-pay | Admitting: Adult Health

## 2015-10-06 NOTE — Progress Notes (Signed)
Patient ID: Jennifer [name], female   DOB: 1941/10/19, 74 y.o.   MRN: [id number]    Facility: [facility]      Allergies  Allergen Reactions  . Tetracycline Other (See Comments)    REACTION: stomach upset  . Flonase [Fluticasone] Cough  . Lisinopril Cough    Chief Complaint  Patient presents with  . Discharge Note    HPI:  She is being discharged to home with home health for pt/ot to continue to work upon her gait, balance and adl retraining. She will need standard wheelchair; and 3:1 commode. She will need her prescriptions to be written and will follow up with [facility].   Past Medical History  Diagnosis Date  . COPD (chronic obstructive pulmonary disease) ([place])     O2 dependent. Followed by Dr. Joya Gaskins  . Fibromyalgia   . Hypertension   . OSA (obstructive sleep apnea)     Sleep study done April 02, 2008 showed severe obstructive sleep apnea.  . Diastolic dysfunction     Grade I diastolic dysfunction as shown on ECHO Dec 2011.  EF of 65-70%.  Marland Kitchen COPD (chronic obstructive pulmonary disease) ([place])     Followed by Dr. Joya Gaskins  . Dyslipidemia   . Osteopenia     DXA scan done on 10/23/2009 showed osteopenia of AP spine (young adult T-score -1.3), osteopenia of left femur neck (young adult T-score -1.8), and normal bone density of right femur neck (young adult T-score -0.8), with a FRAX estimate of the 10-year probability of a major osteoporotic fracture of 9.7% and probability of hip fracture 1.6%.  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 Estanislado Pandy on 11/10/2004.  MRA of the head on 03/09/2012 showed no evidence for recanalization.  . Osteoarthrosis involving multiple sites   . Chronic back pain   . Depression   . Anxiety   . Anemia   . GERD (gastroesophageal reflux disease)   . Meniere disease     Followed by ENT Dr. Silvestre Moment  . Diverticulosis of colon   . Multiple pigmented nevi   .  Hypokalemia   . S/P hysterectomy 1975  . Pneumonia 11/2004  . Knee pain   . Dysphagia   . Urosepsis 11/04/2010    Klebsiella pneumoniae  . Breast pain   . Allergic rhinitis   . Fibromyalgia   . 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     . Urinary incontinence     Past Surgical History  Procedure Laterality Date  . Abdominal hysterectomy  1975  . 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 Estanislado Pandy on 11/10/2004.  Marland Kitchen Ct perc cholecystostomy  04/26/15    Dr. Anselm Pancoast  . Cholecystectomy N/A 06/06/2015    Procedure: LAPAROSCOPIC CHOLECYSTECTOMY WITH INTRAOPERATIVE CHOLANGIOGRAM;  Surgeon: Donnie Mesa, MD;  Location: [place];  Service: General;  Laterality: N/A;    VITAL SIGNS BP 110/52 mmHg  Pulse 82  Ht $R'5\' 3"'sS$  (1.6 m)  Wt 219 lb (99.338 kg)  BMI 38.80 kg/m2  Patient's Medications  New Prescriptions   No medications on file  Previous Medications   ALBUTEROL (PROAIR HFA) 108 (90 BASE) MCG/ACT INHALER  INHALE 2 PUFFS BY MOUTH INTO LUNGS FOUR TIMES DAILY AS NEEDED   ALPRAZOLAM (XANAX) 0.5 MG TABLET    Take 1 tablet (0.5 mg total) by mouth 3 (three) times daily as needed for anxiety.   AMITRIPTYLINE (ELAVIL) 50 MG TABLET    Take 1 tablet (50 mg total) by mouth at bedtime.   ASPIRIN 81 MG EC TABLET    Take 81 mg by mouth daily.     CHOLECALCIFEROL (VITAMIN D3) 1000 UNITS CAPS    Take 1 capsule (1,000 Units total) by mouth daily.   DOCUSATE SODIUM (COLACE) 100 MG CAPSULE    Take 100 mg by mouth daily as needed for mild constipation or moderate constipation.   FLUTICASONE-SALMETEROL (ADVAIR DISKUS) 250-50 MCG/DOSE AEPB    Inhale 1 puff into the lungs 2 (two) times daily.    HYDROCHLOROTHIAZIDE (HYDRODIURIL) 25 MG TABLET    Take 0.5 tablets (12.5 mg total) by mouth daily.   HYDROXYPROPYL METHYLCELLULOSE (ISOPTO TEARS) 2.5 % OPHTHALMIC SOLUTION    Place 2 drops into both eyes 3 (three) times daily as needed. Dry eye   MECLIZINE (ANTIVERT) 12.5 MG TABLET    Take 1 tablet (12.5 mg total) by mouth 2 (two) times daily as needed for dizziness.   MULTIPLE VITAMIN (MULTIVITAMIN) TABLET    Take 1 tablet by mouth daily.   NIFEDIPINE (ADALAT CC) 90 MG 24 HR TABLET    Take 1 tablet (90 mg total) by mouth daily.   OMEPRAZOLE (PRILOSEC) 20 MG CAPSULE    Take 1 capsule (20 mg total) by mouth 2 (two) times daily.   OXYCODONE (OXY IR/ROXICODONE) 5 MG IMMEDIATE RELEASE TABLET    Take one tablet by mouth every 4 hours as needed for pain   POTASSIUM CHLORIDE (K-DUR) 10 MEQ TABLET    Take 2 tablets (20 mEq total) by mouth 2 (two) times daily.   RED YEAST RICE EXTRACT (RED YEAST RICE PO)    Take 1 tablet by mouth 2 (two) times daily.    SENNA-DOCUSATE (SENOKOT-S) 8.6-50 MG PER TABLET    Take 2 tablets by mouth daily as needed for mild constipation.   SERTRALINE (ZOLOFT) 100 MG TABLET    Take 1 tablet (100 mg total) by mouth 2 (two) times daily.   TIOTROPIUM (SPIRIVA HANDIHALER) 18 MCG INHALATION CAPSULE    Place 1 capsule (18 mcg total) into inhaler and inhale daily.   TOPIRAMATE (TOPAMAX) 50 MG TABLET    TAKE 1 TABLET BY MOUTH TWICE A DAY   XARELTO 20 MG TABS TABLET    TAKE 1 TABLET BY MOUTH EVERY EVENING  Modified Medications   No medications on file  Discontinued Medications   No medications on file     SIGNIFICANT DIAGNOSTIC EXAMS  04-26-15: 2-d echo:  - Left ventricle: The cavity size was normal. Wall thickness was normal. Systolic function was normal. The estimated ejection fraction was in the range of 55% to 60%. Wall motion was normal; there were no regional wall motion abnormalities. Doppler parameters are consistent with abnormal left ventricular relaxation (grade 1 diastolic  dysfunction). - Ascending aorta: The ascending aorta was mildly dilated. - Left atrium: The atrium was mildly dilated.  05-25-15: chest x-ray; No acute cardiopulmonary abnormality seen.   05-27-15: cholangiogram: The cystic duct is patent. Blood clots are present in the gallbladder. Recommendations are for drain capping and resuming bag drainage tomorrow.  05-27-15: chest x-ray: Low lung volumes without focal disease. Difficult to exclude vascular congestion.  05-27-15: ct  of abdomen and pelvis: 1. Interval percutaneous cholecystostomy with decompression of the gallbladder. There is no evidence for biliary obstruction. 2. Small fluid collection ventral to the right lobe of liver is identified. This is mixed attenuation and may represent a small biloma and/or small hematoma. No large hematoma is identified at this time. 3. 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 4. Aortic atherosclerosis 5. Infrarenal abdominal aortic aneurysm. Recommend followup by ultrasound in 2 years.   06-03-15: ct of abdomen and pelvis: 1. Cholecystostomy tube remains in place. Unchanged biliary prominence, however the density in the common bile duct is increased compared to prior, which may reflect complex fluid, hemorrhage or infection. Alternatively, vicarious excretion of contrast related to recent prior CT is considered, but fell is likely. 2. Small fluid collection adjacent to the right lobe of the liver has minimally increased in size from prior exam, there is a small amount of surrounding inflammation. Hematoma or biloma again considered. Given the surrounding inflammation, superimposed infection is not excluded. 3. 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 4. Infrarenal abdominal aortic aneurysm. Follow-up ultrasound in 2 years recommended.     LABS REVIEWED:   05-25-15: wbc 8.3;  hgb 12.2; hct 36.7; mcv 98.4; plt 210; glucose 202; bun 10; creat 0.58; k+3.4; na++136; ast 1065; alt 493; alk phos 240; t bili 1.6; albumin 3.0 05-27-15: wbc 15.5; hgb 11.3; hct 33.4.; mcv 96.5; plt 236; glucose 119; bun 13; creat 0.68; k+2.8; na++ 132; mag 1.8; ast 143; alt 523; alk phos 249; t bili 3.1; albumin 2.5 05-29-15: wbc 13.3; hgb 9.5; hct 28.7; mcv 97.6; plt 210; glucose 129; bun 5; creat 0.57; k+3.2;  na++ 134; ast 26; alt 195; alk phos 195; t bili 1.2; albumin 1.9; phos 2.5; hgb a1c 6.0; HIV; nr;   tsh 2.895 AFP rumor 3.4  05-30-15: wbc 12.5; hgb 9.7; hct 29.7; mcv 99; plt 230; glucose 105; bun <5; creat 0.58; k+4.0; na++137; ast 20; alt 135; alk phos 183; t bili 1.0; albumin 2.0; mag 2.0  06-02-15: wbc 15.4; hgb 11.0; hct 33.8; mcv 99.1; plt 412; glucose 188; bun 12; creat 0.73; k+3.9; na++133; ast 188; alt 94; alk phos 566; t bili 1.5; albumin 2.8 06-07-15: wbc 15.3; hgb 8.6; hct 26.2; mcv 96.3; plt 403; glucose 90; bun 11; creat 0.62; k+3.4; na++132; ast 54; alt 125; alk phos 449; t bili 1.1; albumin 1.9 06-09-15: wbc 8.9; hgb 8.4; hct 25.5; mcv 100.0; plt 350; glucose 105; bun <5; creat 0.62; k+4.0; na++134; ast 29; alt 64; alk phos 296; albumin 2.1      Review of Systems Constitutional: Negative for malaise/fatigue.  Respiratory: Negative for cough and shortness of breath.   Cardiovascular: Negative for chest pain, palpitations and leg swelling.  Gastrointestinal: Negative for heartburn, abdominal pain, diarrhea and constipation.  Musculoskeletal: Negative for myalgias and joint pain.  Skin: Negative.   Psychiatric/Behavioral: The patient is not nervous/anxious.     Physical Exam Constitutional: She is oriented to person, place, and time. No distress.  Obese   Neck: Neck supple. No JVD present. No thyromegaly present.  Cardiovascular: Normal rate, regular rhythm and intact distal pulses.   Respiratory: Effort normal and breath sounds normal. No respiratory distress.  GI: Soft.  Bowel sounds are normal. She exhibits no distension. There is no tenderness.    Musculoskeletal: She exhibits no edema.  Is able to move all extremities   Neurological: She is alert and oriented to person, place, and time.  Skin: Skin is warm and dry. She is not diaphoretic.      ASSESSMENT/ PLAN:  Will discharge to home with home health for pt/ptto evaluate and treat as indicated for gait balance and adl retraining. She will need a standard wheelchair in order to allow her to maintain her current level of independence with her ald's which cannot be achieved with a walker and she can self propel. She will need a 3:1 commode. Her prescriptions have been written for a 30 day supply of her medication with oxycodone 5 mg tabs #30.  She will follow up with [place].    Time spent with patient 40   minutes >50% time spent counseling; reviewing medical record; tests; labs; and developing future plan of care     Ok Edwards NP [facility]  Contact [phone] Monday through Friday 8am- 5pm  After hours call [phone]

## 2015-10-09 ENCOUNTER — Other Ambulatory Visit: Payer: Self-pay | Admitting: *Deleted

## 2015-10-09 MED ORDER — NIFEDIPINE ER 90 MG PO TB24: 90.0000 mg | ORAL_TABLET | Freq: Every day | ORAL | Status: DC

## 2015-10-09 MED ORDER — MECLIZINE HCL 12.5 MG PO TABS: 12.5000 mg | ORAL_TABLET | Freq: Two times a day (BID) | ORAL | Status: DC | PRN

## 2015-10-16 ENCOUNTER — Other Ambulatory Visit: Payer: Self-pay

## 2015-10-16 NOTE — Telephone Encounter (Signed)
Last refill 10/10 Last office visit 8/15 Last UDS 8/15

## 2015-10-16 NOTE — Telephone Encounter (Signed)
I will let her know

## 2015-10-16 NOTE — Telephone Encounter (Signed)
I am going to decline this refill request for oxycodone. It has been over three months since I have seen her. She has cancelled two appointments with me recently on 9/12 and 10/10. She has an appointment with me on 11/14 which she will need to keep in order to receive more pain medication.

## 2015-10-17 ENCOUNTER — Other Ambulatory Visit: Payer: Self-pay | Admitting: Adult Health

## 2015-10-20 ENCOUNTER — Encounter: Payer: Self-pay | Admitting: Student in an Organized Health Care Education/Training Program

## 2015-10-20 ENCOUNTER — Ambulatory Visit (INDEPENDENT_AMBULATORY_CARE_PROVIDER_SITE_OTHER): Payer: Medicare HMO | Admitting: Student in an Organized Health Care Education/Training Program

## 2015-10-20 VITALS — BP 103/66 | HR 85 | Temp 98.2°F | Ht 63.0 in | Wt 201.7 lb

## 2015-10-20 DIAGNOSIS — I700 Atherosclerosis of aorta: Secondary | ICD-10-CM | POA: Insufficient documentation

## 2015-10-20 DIAGNOSIS — G4733 Obstructive sleep apnea (adult) (pediatric): Secondary | ICD-10-CM | POA: Diagnosis not present

## 2015-10-20 DIAGNOSIS — Z7951 Long term (current) use of inhaled steroids: Secondary | ICD-10-CM

## 2015-10-20 DIAGNOSIS — I10 Essential (primary) hypertension: Secondary | ICD-10-CM

## 2015-10-20 DIAGNOSIS — Z79899 Other long term (current) drug therapy: Secondary | ICD-10-CM

## 2015-10-20 DIAGNOSIS — Z79891 Long term (current) use of opiate analgesic: Secondary | ICD-10-CM | POA: Insufficient documentation

## 2015-10-20 DIAGNOSIS — J9620 Acute and chronic respiratory failure, unspecified whether with hypoxia or hypercapnia: Secondary | ICD-10-CM | POA: Diagnosis not present

## 2015-10-20 DIAGNOSIS — J439 Emphysema, unspecified: Secondary | ICD-10-CM

## 2015-10-20 DIAGNOSIS — G8929 Other chronic pain: Secondary | ICD-10-CM | POA: Diagnosis not present

## 2015-10-20 DIAGNOSIS — J449 Chronic obstructive pulmonary disease, unspecified: Secondary | ICD-10-CM

## 2015-10-20 DIAGNOSIS — F1920 Other psychoactive substance dependence, uncomplicated: Secondary | ICD-10-CM

## 2015-10-20 DIAGNOSIS — M8580 Other specified disorders of bone density and structure, unspecified site: Secondary | ICD-10-CM

## 2015-10-20 DIAGNOSIS — I729 Aneurysm of unspecified site: Secondary | ICD-10-CM | POA: Diagnosis not present

## 2015-10-20 DIAGNOSIS — Z7982 Long term (current) use of aspirin: Secondary | ICD-10-CM

## 2015-10-20 DIAGNOSIS — Z9181 History of falling: Secondary | ICD-10-CM

## 2015-10-20 DIAGNOSIS — M1712 Unilateral primary osteoarthritis, left knee: Secondary | ICD-10-CM | POA: Insufficient documentation

## 2015-10-20 MED ORDER — FLUTICASONE-SALMETEROL 250-50 MCG/DOSE IN AEPB: 1.0000 | INHALATION_SPRAY | Freq: Two times a day (BID) | RESPIRATORY_TRACT | Status: DC

## 2015-10-20 MED ORDER — ALPRAZOLAM 0.5 MG PO TABS: 0.5000 mg | ORAL_TABLET | Freq: Three times a day (TID) | ORAL | Status: DC | PRN

## 2015-10-20 MED ORDER — NIFEDIPINE ER 60 MG PO TB24: 60.0000 mg | ORAL_TABLET | Freq: Every day | ORAL | Status: DC

## 2015-10-20 MED ORDER — SERTRALINE HCL 100 MG PO TABS: 100.0000 mg | ORAL_TABLET | Freq: Two times a day (BID) | ORAL | Status: DC

## 2015-10-20 MED ORDER — OXYCODONE HCL 5 MG PO TABS: ORAL_TABLET | ORAL | Status: DC

## 2015-10-20 MED ORDER — RIVAROXABAN 20 MG PO TABS: 20.0000 mg | ORAL_TABLET | Freq: Every evening | ORAL | Status: DC

## 2015-10-20 MED ORDER — MECLIZINE HCL 12.5 MG PO TABS: 12.5000 mg | ORAL_TABLET | Freq: Two times a day (BID) | ORAL | Status: DC | PRN

## 2015-10-20 NOTE — Assessment & Plan Note (Signed)
Respiratory function is stable, no recent exacerbations. FEV1 66%, Gold stage 2, previously home O2 dependent which is now resolved. Continue Advair, refilled today.

## 2015-10-20 NOTE — Assessment & Plan Note (Signed)
One major fall this year, no further since our last appointment. She completed a round of PT with some benefit. I prescribed a rollator walker which she has not yet obtained because of copay cost. More counseling given today about polypharmacy risk, we reduced oxycodone usage, continue alprazolam for now, stopped topiramate. I strongly encouraged using the rollator walker in the future.

## 2015-10-20 NOTE — Assessment & Plan Note (Signed)
Last Dexa 2010 showed osteopenia with T score of -1.8, FRAX risk of 1.6% hip and 9% any fracture. I anticipate this could have progressed to higher risk since that study, so I have ordered a repeat Dexa scan and we will use alendronate if she qualifies.

## 2015-10-20 NOTE — Assessment & Plan Note (Signed)
Blood pressure is probably too aggressively controlled. BP 103/66 today and she does endorse frequent lightheadedness when standing. She likes the HCTZ because it reduces LE edema. Decrease Nifedipine from 90 to 60mg  daily.

## 2015-10-20 NOTE — Assessment & Plan Note (Signed)
Atherosclerotic disease noted on CT abdomen in 2016. No history of MI or CVA. She has tried Rosuvastatin in the past and was intolerant due to myalgias. She is not interested in restarting. We will continue aspirin daily and consider using a low dose of pravastatin in the future for primary prevention.

## 2015-10-20 NOTE — Assessment & Plan Note (Signed)
Patient uses Oxycodone 5mg  tablets 3-4 times daily as needed for pain related to osteoarthritis. She has had one fall this year and is advanced age making her higher risk for chronic narcotics, so I follow her closely. Last UDS 07/2015 was appropriate, last database 07/2015 also appropriate. We decreased Oxycodone usage from #120 tabs to #105 tabs for one month supply. We will continue to decrease slowly over the next 1-2 years.

## 2015-10-20 NOTE — Patient Instructions (Signed)
1. Try to get a rolling walker from the medical equipment company.   2. Schedule your bone density scan.   3. Go to an orthopedic surgeon to talk about the risks and benefits of a knee replacement.   4. Have your blood drawn today to monitor potassium and kidney function.

## 2015-10-20 NOTE — Progress Notes (Signed)
   See Encounters tab for problem-based medical decision making  __________________________________________________________  HPI:  74 year old woman with multiple medical problems presents for left knee pain follow up. Osteoarthritis has been a long standing problem. The pain is stable. She can ambulate but is limited by pain. She uses oxycodone as needed for the pain with some benefit. Last fall was in July. She completed a round of PT recently with good benefit. Still working on home exercises. Pain also present in right hip. Knee pain is 6/10 and dull, non-radiating. Lives with daughter, significant financial stress, could not afford the co pay of a rollator I recently prescribed. No fevers, cough, SOB, or chest pain. Good compliance with other meds. Missed one appointment with me last month.   __________________________________________________________  Problem List: Patient Active Problem List   Diagnosis Date Noted  . Atherosclerosis of aorta ([place]) 10/20/2015  . Osteoarthritis of left knee 10/20/2015  . Chronic pain with drug dependence ([place]) 10/20/2015  . At high risk for falls 07/21/2015  . Abdominal aortic aneurysm ([place]) 05/29/2015  . Portal vein thrombosis 05/28/2015  . Paroxysmal atrial fibrillation ([facility])   . Vitamin D deficiency 01/23/2015  . Preventative health care 09/11/2013  . Cerebral ventriculomegaly 04/19/2012  . Diastolic heart failure ([place]) 11/06/2010  . Osteopenia 10/23/2009  . Gastroesophageal reflux disease 04/19/2008  . Obstructive sleep apnea 04/17/2008  . Meniere's disease 03/23/2007  . CEREBRAL ANEURYSM 03/23/2007  . Generalized osteoarthrosis 01/13/2007  . ANXIETY DISORDER, GENERALIZED 12/29/2006  . Depression 12/29/2006  . Essential hypertension 12/29/2006  . COPD with emphysema ([place]) 12/29/2006    Medications: Reconciled today in Epic __________________________________________________________  Physical Exam:  Vital Signs: Filed Vitals:   10/20/15 0821  BP: 103/66  Pulse: 85  Temp: 98.2 F (36.8 C)  TempSrc: Oral  Height: 5\' 3"  (1.6 m)  Weight: 201 lb 11.2 oz (91.491 kg)  SpO2: 97%    Gen: well appearing older woman ENT: OP clear without erythema or exudate, upper dentures Neck: No cervical LAD, No thyromegaly or nodules, No JVD. CV: RRR, no murmurs Pulm: Normal effort, CTA throughout, no wheezing Abd: Soft, NT, ND, normal BS.  Ext: Warm, no edema, left knee with severe crepitus Skin: No atypical appearing moles. No rashes

## 2015-10-20 NOTE — Assessment & Plan Note (Addendum)
Osteoarthritis is a long term cause of chronic pain, along with pain in her right hip. Xray shows severe tricompartmental arthritis changes especially in the left knee. This is her primary pain generator. I think she has approached the need for knee replacement surgery, especially if it helps [place] reduce the amount of narcotics and other centrally acting pain medications. I referred her to ortho surgery for consideration.

## 2015-10-21 LAB — BMP8+ANION GAP
Anion Gap: 17 mmol/L (ref 10.0–18.0)
BUN / CREAT RATIO: 24 (ref 11–26)
BUN: 19 mg/dL (ref 8–27)
CHLORIDE: 107 mmol/L — AB (ref 97–106)
CO2: 20 mmol/L (ref 18–29)
Calcium: 9.4 mg/dL (ref 8.7–10.3)
Creatinine, Ser: 0.78 mg/dL (ref 0.57–1.00)
GFR calc non Af Amer: 74 mL/min/{1.73_m2} (ref >59)
GFR, EST AFRICAN AMERICAN: 85 mL/min/{1.73_m2} (ref >59)
GLUCOSE: 96 mg/dL (ref 65–99)
Potassium: 3.7 mmol/L (ref 3.5–5.2)
Sodium: 144 mmol/L (ref 136–144)

## 2015-11-01 ENCOUNTER — Encounter: Payer: Self-pay | Admitting: Student in an Organized Health Care Education/Training Program

## 2015-11-26 ENCOUNTER — Inpatient Hospital Stay: Admission: RE | Admit: 2015-11-26 | Payer: Medicare HMO | Source: Ambulatory Visit

## 2015-12-03 ENCOUNTER — Other Ambulatory Visit: Payer: Self-pay | Admitting: Internal Medicine

## 2015-12-04 ENCOUNTER — Other Ambulatory Visit: Payer: Self-pay | Admitting: Adult Health

## 2015-12-09 NOTE — Addendum Note (Signed)
Addended by: Alinda Dooms A on: 12/09/2015 04:27 PM   Modules accepted: Orders

## 2015-12-18 ENCOUNTER — Other Ambulatory Visit (HOSPITAL_COMMUNITY): Payer: Self-pay | Admitting: Interventional Radiology

## 2015-12-18 DIAGNOSIS — I729 Aneurysm of unspecified site: Secondary | ICD-10-CM

## 2015-12-20 ENCOUNTER — Other Ambulatory Visit: Payer: Self-pay | Admitting: Student in an Organized Health Care Education/Training Program

## 2015-12-29 ENCOUNTER — Ambulatory Visit (INDEPENDENT_AMBULATORY_CARE_PROVIDER_SITE_OTHER): Payer: Medicare HMO | Admitting: Internal Medicine

## 2015-12-29 ENCOUNTER — Ambulatory Visit: Payer: Medicare HMO | Admitting: Internal Medicine

## 2015-12-29 VITALS — BP 105/60 | HR 90 | Temp 98.0°F | Wt 211.9 lb

## 2015-12-29 DIAGNOSIS — R112 Nausea with vomiting, unspecified: Secondary | ICD-10-CM

## 2015-12-29 DIAGNOSIS — B372 Candidiasis of skin and nail: Secondary | ICD-10-CM

## 2015-12-29 DIAGNOSIS — N390 Urinary tract infection, site not specified: Secondary | ICD-10-CM | POA: Diagnosis not present

## 2015-12-29 DIAGNOSIS — R21 Rash and other nonspecific skin eruption: Secondary | ICD-10-CM | POA: Diagnosis not present

## 2015-12-29 MED ORDER — NYSTATIN 100000 UNIT/GM EX POWD: CUTANEOUS | Status: DC

## 2015-12-29 NOTE — Progress Notes (Signed)
Subjective:    Patient ID: Jennifer [name], female    DOB: 19-Jun-1941, 75 y.o.   MRN: [id number]  HPI Comments: Ms. Haralson is a 75 year old woman with PMH as below who originally made this appt due to N/V, diarrhea and generalized body ache that has since resolved.  Symptoms started two days ago (Saturday).  Myalgias, headaches and vomiting (a lot of vomiting on Saturday), fevers as high as 103F.  LLQ pain.  No flank pain.  No diarrhea.  She reports two normal BMs (one each day, soft, no blood or mucous).  Symptoms are improving, afebrile today, but she still does not feel quite back to baseline.    Past Medical History  Diagnosis Date  . COPD (chronic obstructive pulmonary disease) ([place])     O2 dependent. Followed by Dr. Joya Gaskins  . Fibromyalgia   . Hypertension   . OSA (obstructive sleep apnea)     Sleep study done April 02, 2008 showed severe obstructive sleep apnea.  . Diastolic dysfunction     Grade I diastolic dysfunction as shown on ECHO Dec 2011.  EF of 65-70%.  Marland Kitchen COPD (chronic obstructive pulmonary disease) ([place])     Followed by Dr. Joya Gaskins  . Dyslipidemia   . Osteopenia     DXA scan done on 10/23/2009 showed osteopenia of AP spine (young adult T-score -1.3), osteopenia of left femur neck (young adult T-score -1.8), and normal bone density of right femur neck (young adult T-score -0.8), with a FRAX estimate of the 10-year probability of a major osteoporotic fracture of 9.7% and probability of hip fracture 1.6%.  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 Estanislado Pandy on 11/10/2004.  MRA of the head on 03/09/2012 showed no evidence for recanalization.  . Osteoarthrosis involving multiple sites   . Chronic back pain   . Depression   . Anxiety   . Anemia   . GERD (gastroesophageal reflux disease)   . Meniere disease     Followed by ENT Dr. Silvestre Moment  . Diverticulosis of colon   . Multiple pigmented nevi   .  Hypokalemia   . S/P hysterectomy 1975  . Pneumonia 11/2004  . Knee pain   . Dysphagia   . Urosepsis 11/04/2010    Klebsiella pneumoniae  . Breast pain   . Allergic rhinitis   . Fibromyalgia   . 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     . Urinary incontinence    Current Outpatient Prescriptions on File Prior to Visit  Medication Sig Dispense Refill  . albuterol (PROAIR HFA) 108 (90 BASE) MCG/ACT inhaler INHALE 2 PUFFS BY MOUTH INTO LUNGS FOUR TIMES DAILY AS NEEDED (Patient taking differently: Inhale 2 puffs into the lungs every 6 (six) hours as needed for wheezing or shortness of breath. ) 3 Inhaler 6  . ALPRAZolam (XANAX) 0.5 MG tablet Take 1 tablet (0.5 mg total) by mouth 3 (three) times daily as needed for anxiety. 120 tablet 2  . amitriptyline (ELAVIL) 50 MG tablet Take 1 tablet (50 mg total) by mouth at bedtime. 30 tablet 3  . aspirin 81 MG EC tablet Take 81 mg by mouth daily.      . Cholecalciferol (VITAMIN D3) 1000  UNITS CAPS Take 1 capsule (1,000 Units total) by mouth daily. 30 capsule 2  . docusate sodium (COLACE) 100 MG capsule Take 100 mg by mouth daily as needed for mild constipation or moderate constipation.    . Fluticasone-Salmeterol (ADVAIR DISKUS) 250-50 MCG/DOSE AEPB Inhale 1 puff into the lungs 2 (two) times daily. 180 each 2  . hydrochlorothiazide (HYDRODIURIL) 25 MG tablet Take 0.5 tablets (12.5 mg total) by mouth daily. 45 tablet 2  . hydroxypropyl methylcellulose (ISOPTO TEARS) 2.5 % ophthalmic solution Place 2 drops into both eyes 3 (three) times daily as needed. Dry eye    . KLOR-CON 10 10 MEQ tablet TAKE 2 TABLETS BY MOUTH 2 TIMES DAILY 120 tablet 5  . meclizine (ANTIVERT) 12.5 MG tablet Take 1 tablet (12.5 mg total) by mouth 2 (two) times  daily as needed for dizziness. 180 tablet 0  . Multiple Vitamin (MULTIVITAMIN) tablet Take 1 tablet by mouth daily.    Marland Kitchen NIFEdipine (PROCARDIA-XL/ADALAT CC) 60 MG 24 hr tablet Take 1 tablet (60 mg total) by mouth daily. 30 tablet 5  . omeprazole (PRILOSEC) 20 MG capsule Take 1 capsule (20 mg total) by mouth 2 (two) times daily. 180 capsule 3  . oxyCODONE (OXY IR/ROXICODONE) 5 MG immediate release tablet Take one tablet by mouth every 4 hours as needed for pain 105 tablet 0  . ranitidine (ZANTAC) 150 MG tablet TAKE 1 TABLET BY MOUTH AT BEDTIME 90 tablet 0  . Red Yeast Rice Extract (RED YEAST RICE PO) Take 1 tablet by mouth 2 (two) times daily.     . rivaroxaban (XARELTO) 20 MG TABS tablet Take 1 tablet (20 mg total) by mouth every evening. 30 tablet 5  . senna-docusate (SENOKOT-S) 8.6-50 MG per tablet Take 2 tablets by mouth daily as needed for mild constipation. 60 tablet 6  . sertraline (ZOLOFT) 100 MG tablet Take 1 tablet (100 mg total) by mouth 2 (two) times daily. 180 tablet 1  . tiotropium (SPIRIVA HANDIHALER) 18 MCG inhalation capsule Place 1 capsule (18 mcg total) into inhaler and inhale daily. 90 capsule 4   No current facility-administered medications on file prior to visit.    Review of Systems  Constitutional: Positive for fever, chills and appetite change.       Decreased appetite this weekend.  Respiratory: Negative for cough and shortness of breath.   Cardiovascular: Negative for chest pain.  Gastrointestinal: Positive for vomiting. Negative for nausea, abdominal pain, diarrhea, constipation and blood in stool.  Genitourinary: Negative for dysuria, hematuria and difficulty urinating.       Increased incontinence.  Musculoskeletal: Positive for arthralgias.       Chronic knee pain 2/2 OA.  Skin: Positive for rash.       Red rash beneath left breast and in groin area x 6 months.  Neurological: Positive for headaches. Negative for syncope.       Filed Vitals:   12/29/15  1447  BP: 105/60  Pulse: 90  Temp: 98 F (36.7 C)  TempSrc: Oral  Weight: 211 lb 14.4 oz (96.117 kg)  SpO2: 98%     Objective:   Physical Exam  Constitutional: She is oriented to person, place, and time. She appears well-developed. No distress.  HENT:  Head: Normocephalic and atraumatic.  Right Ear: External ear normal.  Left Ear: External ear normal.  Mouth/Throat: Oropharynx is clear and moist. No oropharyngeal exudate.  Eyes: Conjunctivae and EOM are normal. Pupils are equal, round, and reactive to light. Right eye  exhibits no discharge. Left eye exhibits no discharge. No scleral icterus.  Neck: Normal range of motion. Neck supple.  Cardiovascular: Normal rate, regular rhythm and normal heart sounds.  Exam reveals no gallop and no friction rub.   No murmur heard. Pulmonary/Chest: Effort normal and breath sounds normal. No respiratory distress. She has no wheezes. She has no rales.  Abdominal: Soft. Bowel sounds are normal. She exhibits no distension and no mass. There is no tenderness. There is no rebound and no guarding.  No CVAT.  Musculoskeletal: Normal range of motion. She exhibits no edema or tenderness.  Lymphadenopathy:    She has no cervical adenopathy.  Neurological: She is alert and oriented to person, place, and time. No cranial nerve deficit.  Skin: Skin is warm. Rash noted. She is not diaphoretic.  Flat, pink rash beneath left breast, left pannus and B/L inguinal region.  + satellite lesions.  Skin is intact.  Psychiatric: She has a normal mood and affect. Her behavior is normal. Judgment and thought content normal.  Vitals reviewed.         Assessment & Plan:  Please see problem based charting for A&P.

## 2015-12-29 NOTE — Patient Instructions (Signed)
1. I will call you if there are problems with your labs.  I think this was likely a viral infection that your body has taken care of.  I am checking your urine to make sure there is no UTI.  If so, I will send in an antibiotic for you.   2. Please take all medications as prescribed.    3. If you have worsening of your symptoms or new symptoms arise, please call the clinic [phone]), or go to the ER immediately if symptoms are severe.  Please come back to see Dr. Evette Doffing on Feb 13 as previously scheduled.  Come back sooner if you get sick or have problems.

## 2015-12-30 LAB — MICROSCOPIC EXAMINATION: Casts: NONE SEEN /lpf

## 2015-12-30 LAB — URINALYSIS, ROUTINE W REFLEX MICROSCOPIC
BILIRUBIN UA: NEGATIVE
Glucose, UA: NEGATIVE
KETONES UA: NEGATIVE
Nitrite, UA: NEGATIVE
SPEC GRAV UA: 1.01 (ref 1.005–1.030)
Urobilinogen, Ur: 0.2 mg/dL (ref 0.2–1.0)
pH, UA: 6 (ref 5.0–7.5)

## 2015-12-30 LAB — CBC WITH DIFFERENTIAL/PLATELET
BASOS ABS: 0 10*3/uL (ref 0.0–0.2)
Basos: 0 %
EOS (ABSOLUTE): 0 10*3/uL (ref 0.0–0.4)
EOS: 0 %
HEMATOCRIT: 33.5 % — AB (ref 34.0–46.6)
HEMOGLOBIN: 11.6 g/dL (ref 11.1–15.9)
IMMATURE GRANS (ABS): 0.1 10*3/uL (ref 0.0–0.1)
IMMATURE GRANULOCYTES: 1 %
LYMPHS: 15 %
Lymphocytes Absolute: 2.7 10*3/uL (ref 0.7–3.1)
MCH: 33.2 pg — ABNORMAL HIGH (ref 26.6–33.0)
MCHC: 34.6 g/dL (ref 31.5–35.7)
MCV: 96 fL (ref 79–97)
MONOCYTES: 10 %
Monocytes Absolute: 1.9 10*3/uL — ABNORMAL HIGH (ref 0.1–0.9)
NEUTROS PCT: 74 %
Neutrophils Absolute: 13.8 10*3/uL — ABNORMAL HIGH (ref 1.4–7.0)
Platelets: 216 10*3/uL (ref 150–379)
RBC: 3.49 x10E6/uL — ABNORMAL LOW (ref 3.77–5.28)
RDW: 13.7 % (ref 12.3–15.4)
WBC: 18.6 10*3/uL — AB (ref 3.4–10.8)

## 2015-12-30 LAB — CMP14 + ANION GAP
ALBUMIN: 3.6 g/dL (ref 3.5–4.8)
ALK PHOS: 86 IU/L (ref 39–117)
ALT: 20 IU/L (ref 0–32)
AST: 25 IU/L (ref 0–40)
Albumin/Globulin Ratio: 1.3 (ref 1.1–2.5)
Anion Gap: 18 mmol/L (ref 10.0–18.0)
BUN/Creatinine Ratio: 18 (ref 11–26)
BUN: 23 mg/dL (ref 8–27)
Bilirubin Total: 0.4 mg/dL (ref 0.0–1.2)
CO2: 20 mmol/L (ref 18–29)
Calcium: 8.9 mg/dL (ref 8.7–10.3)
Chloride: 98 mmol/L (ref 96–106)
Creatinine, Ser: 1.25 mg/dL — ABNORMAL HIGH (ref 0.57–1.00)
GFR calc Af Amer: 48 mL/min/{1.73_m2} — ABNORMAL LOW (ref >59)
GFR calc non Af Amer: 42 mL/min/{1.73_m2} — ABNORMAL LOW (ref >59)
Globulin, Total: 2.7 g/dL (ref 1.5–4.5)
Glucose: 104 mg/dL — ABNORMAL HIGH (ref 65–99)
POTASSIUM: 3.9 mmol/L (ref 3.5–5.2)
SODIUM: 136 mmol/L (ref 134–144)
TOTAL PROTEIN: 6.3 g/dL (ref 6.0–8.5)

## 2015-12-31 DIAGNOSIS — R112 Nausea with vomiting, unspecified: Secondary | ICD-10-CM | POA: Insufficient documentation

## 2015-12-31 DIAGNOSIS — B372 Candidiasis of skin and nail: Secondary | ICD-10-CM | POA: Insufficient documentation

## 2015-12-31 LAB — URINE CULTURE

## 2015-12-31 MED ORDER — LEVOFLOXACIN 750 MG PO TABS: 750.0000 mg | ORAL_TABLET | Freq: Every day | ORAL | Status: DC

## 2015-12-31 NOTE — Assessment & Plan Note (Addendum)
Assessment:  Patient with rash beneath left breast, left pannus and B/L inguinal folds.  The groin rash is sometimes pruritic.  Given appearance, intertriginous location, satellite lesions this is most c/w intertrigo.  The skin is intact and there are no signs of superimposed bacterial infection.  Patient's daughter says it has been present on and off for about 6 months.  They are requesting Nystatin which they have used before but ran out.   Plan:  rx sent for nystatin powder.  Patient to try and keep areas dry, avoid tight fitting clothes, d/c bra when at home, change Depend as soon as it gets wet.  She was advised to avoid scratching the area.  It may be difficult to control in groin area since she wears adult diapers.  May need to consider trying an azole if symptoms persists.

## 2015-12-31 NOTE — Assessment & Plan Note (Addendum)
Assessment:  Patient with fever, N/V, urinary incontinence for 48 hours that has improved.  VSS stable in clinic, no signs of pulmonary or skin infection.  I suspect GI etiology given acute vomiting that has since resolved, however, no diarrhea.  Possibly pyelo as well given fever, N/V, urinary incontinence (family says this is new but she wears Depends regularly).  However, no CVAT or dysuria.  Given her older age she may have few or atypical symptoms.  Thankfully, she is improving; she is no longer vomiting, eating and drinking, mentating normally and she lives with daughter so I am comfortable getting lab work today and sending her home while we wait for results. Plan:  CBC, BMP, urinalysis and urine culture.  If UA/culture positive will send appropriate abx.  If neg, will continue symptomatic care for likely viral illness.  ADDENDUM:  Urine cx positive for > 100K GNR.  Will treat as pyelo and send rx for levo 750mg  daily x 5 days (GFR ok).  Patient daughter given this info and advised to RTC if symptoms return or worsen. Otherwise, she is scheduled for follow-up with PCP in 3 weeks.

## 2015-12-31 NOTE — Progress Notes (Signed)
Internal Medicine Clinic Attending  I saw and evaluated the patient.  I personally confirmed the key portions of the history and exam documented by Dr. Wilson and I reviewed pertinent patient test results.  The assessment, diagnosis, and plan were formulated together and I agree with the documentation in the resident's note. 

## 2016-01-05 ENCOUNTER — Telehealth: Payer: Self-pay | Admitting: Student in an Organized Health Care Education/Training Program

## 2016-01-05 ENCOUNTER — Ambulatory Visit (HOSPITAL_COMMUNITY): Admission: RE | Admit: 2016-01-05 | Payer: Medicare HMO | Source: Ambulatory Visit

## 2016-01-05 MED ORDER — OXYCODONE HCL 5 MG PO TABS: ORAL_TABLET | ORAL | Status: DC

## 2016-01-05 NOTE — Telephone Encounter (Signed)
Informally saw Jennifer [name] in clinic today, she was accompanying her daughter to her visit. She tells me that she will be out of oxycodone 1 week before our appointment. Last appt was 10/21/15, received three month supply, which would run out 01/12/16. Our appointment was set for 01/19/16 (probably our mistake). I gave her a one month script to use next week. I will give her the rest of the refills after re-evaluating her at our next visit.

## 2016-01-16 ENCOUNTER — Ambulatory Visit (HOSPITAL_COMMUNITY): Admission: RE | Admit: 2016-01-16 | Payer: Medicare HMO | Source: Ambulatory Visit

## 2016-01-19 ENCOUNTER — Ambulatory Visit (INDEPENDENT_AMBULATORY_CARE_PROVIDER_SITE_OTHER): Payer: Medicare HMO | Admitting: Student in an Organized Health Care Education/Training Program

## 2016-01-19 ENCOUNTER — Other Ambulatory Visit: Payer: Self-pay | Admitting: Student in an Organized Health Care Education/Training Program

## 2016-01-19 VITALS — BP 114/42 | HR 87 | Temp 98.0°F | Wt 200.8 lb

## 2016-01-19 DIAGNOSIS — J449 Chronic obstructive pulmonary disease, unspecified: Secondary | ICD-10-CM | POA: Diagnosis not present

## 2016-01-19 DIAGNOSIS — R21 Rash and other nonspecific skin eruption: Secondary | ICD-10-CM

## 2016-01-19 DIAGNOSIS — M1990 Unspecified osteoarthritis, unspecified site: Secondary | ICD-10-CM

## 2016-01-19 DIAGNOSIS — F1920 Other psychoactive substance dependence, uncomplicated: Secondary | ICD-10-CM

## 2016-01-19 DIAGNOSIS — B372 Candidiasis of skin and nail: Secondary | ICD-10-CM

## 2016-01-19 DIAGNOSIS — G4733 Obstructive sleep apnea (adult) (pediatric): Secondary | ICD-10-CM

## 2016-01-19 DIAGNOSIS — Z79810 Long term (current) use of selective estrogen receptor modulators (SERMs): Secondary | ICD-10-CM

## 2016-01-19 DIAGNOSIS — G8929 Other chronic pain: Secondary | ICD-10-CM | POA: Diagnosis not present

## 2016-01-19 DIAGNOSIS — J439 Emphysema, unspecified: Secondary | ICD-10-CM

## 2016-01-19 DIAGNOSIS — F411 Generalized anxiety disorder: Secondary | ICD-10-CM

## 2016-01-19 DIAGNOSIS — R296 Repeated falls: Secondary | ICD-10-CM

## 2016-01-19 DIAGNOSIS — Z9181 History of falling: Secondary | ICD-10-CM

## 2016-01-19 DIAGNOSIS — Z9989 Dependence on other enabling machines and devices: Secondary | ICD-10-CM

## 2016-01-19 MED ORDER — FLUCONAZOLE 200 MG PO TABS: 200.0000 mg | ORAL_TABLET | ORAL | Status: DC

## 2016-01-19 MED ORDER — OXYCODONE HCL 5 MG PO TABS: ORAL_TABLET | ORAL | Status: DC

## 2016-01-19 MED ORDER — ALPRAZOLAM 0.5 MG PO TABS: 0.5000 mg | ORAL_TABLET | Freq: Three times a day (TID) | ORAL | Status: DC | PRN

## 2016-01-19 NOTE — Assessment & Plan Note (Signed)
Compliant with CPAP but says she needs a new mask. Wants to switch DME companies from [place] to [facility]. We will see if this is an option for her. She will need a new mask to ensure good compliance continues.

## 2016-01-19 NOTE — Assessment & Plan Note (Signed)
Exam consistent with intertriginous candidiasis, not responsive to nystatin cream. Ordered fluconazole 200mg  PO x 2 tabs.

## 2016-01-19 NOTE — Progress Notes (Signed)
   See Encounters tab for problem-based medical decision making  __________________________________________________________  HPI:   75 year old woman with COPD here for increasing productive cough for the last 1 week. She is in clinic about 2 weeks ago for acute illness, found to have urinary tract infection, treated for 5 days with levofloxacin. Towards the end of that treatment she developed a sore throat which then progressed into nasal congestion, sinus pressure, increasing productive cough with a clear to green sputum. Uses Mucinex at home with some improvement. Denies any fevers at home. No body aches. Her daughter had bronchitis the week prior. She reports good compliance with all her other medications. Daughter does report that she had 2 falls since I last saw her. One is she fell out of the bed while sitting at the edge, the others she fell down while walking to the bathroom. They say she hasn't used any alprazolam in the last few days because of her upper respiratory infection. They continue to struggle financially at home, it seems they're also having some difficulty obtaining stable food supply.   Second complaint is of increasing rash underneath her breasts. It is red and itchy and tender. She's been using nystatin cream with little benefit.   __________________________________________________________  Problem List: Patient Active Problem List   Diagnosis Date Noted  . Intertriginous candidiasis 01/19/2016  . Atherosclerosis of aorta ([place]) 10/20/2015  . Osteoarthritis of left knee 10/20/2015  . Chronic pain with drug dependence ([place]) 10/20/2015  . At high risk for falls 07/21/2015  . Abdominal aortic aneurysm ([place]) 05/29/2015  . Portal vein thrombosis 05/28/2015  . Paroxysmal atrial fibrillation ([facility])   . Preventative health care 09/11/2013  . Cerebral ventriculomegaly 04/19/2012  . Diastolic heart failure ([place]) 11/06/2010  . Osteopenia 10/23/2009  . Gastroesophageal reflux  disease 04/19/2008  . Obstructive sleep apnea 04/17/2008  . Meniere's disease 03/23/2007  . CEREBRAL ANEURYSM 03/23/2007  . Generalized osteoarthrosis 01/13/2007  . ANXIETY DISORDER, GENERALIZED 12/29/2006  . Depression 12/29/2006  . Essential hypertension 12/29/2006  . COPD with emphysema ([place]) 12/29/2006    Medications: Reconciled today in Epic __________________________________________________________  Physical Exam:  Vital Signs: Filed Vitals:   01/19/16 1029  BP: 114/42  Pulse: 87  Temp: 98 F (36.7 C)  TempSrc: Oral  Weight: 200 lb 12.8 oz (91.082 kg)  SpO2: 100%    Gen: Chronically ill appearing ENT: OP clear without erythema or exudate.  Neck: No cervical LAD, No thyromegaly or nodules, No JVD. CV: RRR, no murmurs Pulm: mild dyspnea, CTA throughout, no wheezing Ext: Warm, no edema, normal joints Skin: No atypical appearing moles. Red non blanching rash under her bilateral breasts with papules and satellite lesions.

## 2016-01-19 NOTE — Assessment & Plan Note (Signed)
Symptomatically stable. She reports good effect of alprazolam with no adverse effects. I have refilled alprazolam #120 R-2 for three months supply.

## 2016-01-19 NOTE — Assessment & Plan Note (Signed)
Chronic pain generator is osteoarthritis. She reports good compliance with her medications. She's had 2 falls since last visit, which are likely multifactorial. She remains at moderate to high risk for these medications, but she reports great  functional benefit and wants to continue .Last UDS 07/2015 was appropriate, last database 07/2015 also appropriate. I refilled oxycodone #105 with two refills for a three month supply. I will see her back in clinic in three months and provide all future refills.

## 2016-01-19 NOTE — Patient Instructions (Signed)
1. You most likely have a viral upper respiratory infection rather than pneumonia. Let me know if your cough gets worse, shortness of breath gets worse, or if you develop new fevers. If you do I can call you in an antibiotic. But right now I think this is most likely a virus that will get better on its own over the next 1-2 weeks.  2. I refilled your pain and anxiety medication. I recommend using less of these medicines the next few days while you are sick with the respiratory infection.   3. We will see if we can help get a new CPAP mask through [facility].

## 2016-01-19 NOTE — Assessment & Plan Note (Signed)
COPD has largely been stable over the last several years. She comes in today with symptoms consistent with a viral upper respiratory infection. I don't think she is in COPD exacerbation, no wheezing on exam and otherwise she is looking well. Low risk for bacterial pneumonia at this point. I advised continued supportive care. If she has worsening symptoms or fevers in the future, we can use azithromycin and steroids.

## 2016-01-19 NOTE — Assessment & Plan Note (Signed)
Two more falls since last visit, likely due to recent UTI. She remains high risk for falling due to multiple centrally acting medications. We are trying to reduce the burden of alprazolam and oxycodone, but the patient is very resistant, tells me they are very helpful for her functional status. I have ordered DEXA recently, but they tell me they cannot afford the copay. I reviewed physical therapy for gait training in the past, but again there is a financial barrier.

## 2016-01-28 ENCOUNTER — Other Ambulatory Visit: Payer: Self-pay | Admitting: Student in an Organized Health Care Education/Training Program

## 2016-01-30 ENCOUNTER — Telehealth: Payer: Self-pay | Admitting: Student in an Organized Health Care Education/Training Program

## 2016-01-30 NOTE — Telephone Encounter (Signed)
Dr. Evette Doffing?  What can I tell Jennifer [name]'s daughter regarding the elavil?

## 2016-01-30 NOTE — Telephone Encounter (Signed)
Patient's daughter would like to know why her Elavil was not approved.  She states her mother needs it to sleep.

## 2016-02-02 MED ORDER — AMITRIPTYLINE HCL 50 MG PO TABS: 50.0000 mg | ORAL_TABLET | Freq: Every day | ORAL | Status: DC

## 2016-02-02 NOTE — Telephone Encounter (Signed)
Ok. Tell her I sent a refill to the [facility]. I was not sure if she was still using it for sleep. Thanks, Damita Dunnings

## 2016-02-05 ENCOUNTER — Other Ambulatory Visit: Payer: Self-pay | Admitting: Adult Health

## 2016-02-05 ENCOUNTER — Other Ambulatory Visit: Payer: Self-pay | Admitting: Student in an Organized Health Care Education/Training Program

## 2016-02-06 ENCOUNTER — Other Ambulatory Visit: Payer: Self-pay | Admitting: Adult Health

## 2016-02-10 DIAGNOSIS — J9620 Acute and chronic respiratory failure, unspecified whether with hypoxia or hypercapnia: Secondary | ICD-10-CM | POA: Diagnosis not present

## 2016-02-10 DIAGNOSIS — I729 Aneurysm of unspecified site: Secondary | ICD-10-CM | POA: Diagnosis not present

## 2016-02-10 DIAGNOSIS — G4733 Obstructive sleep apnea (adult) (pediatric): Secondary | ICD-10-CM | POA: Diagnosis not present

## 2016-02-24 ENCOUNTER — Other Ambulatory Visit: Payer: Self-pay | Admitting: Student in an Organized Health Care Education/Training Program

## 2016-02-26 ENCOUNTER — Other Ambulatory Visit: Payer: Self-pay | Admitting: Internal Medicine

## 2016-03-09 ENCOUNTER — Telehealth (HOSPITAL_COMMUNITY): Payer: Self-pay

## 2016-03-09 NOTE — Telephone Encounter (Signed)
Called pt's daughter to reschedule consult, no answer, no vm. Will try back later. AW

## 2016-04-04 ENCOUNTER — Other Ambulatory Visit: Payer: Self-pay | Admitting: Student in an Organized Health Care Education/Training Program

## 2016-04-05 NOTE — Telephone Encounter (Signed)
Appointment scheduled 6/12.

## 2016-04-14 ENCOUNTER — Other Ambulatory Visit: Payer: Self-pay | Admitting: Adult Health

## 2016-04-18 ENCOUNTER — Other Ambulatory Visit: Payer: Self-pay | Admitting: Student in an Organized Health Care Education/Training Program

## 2016-04-18 ENCOUNTER — Other Ambulatory Visit: Payer: Self-pay | Admitting: Adult Health

## 2016-04-23 ENCOUNTER — Other Ambulatory Visit: Payer: Self-pay | Admitting: *Deleted

## 2016-04-23 ENCOUNTER — Encounter: Payer: Self-pay | Admitting: Student in an Organized Health Care Education/Training Program

## 2016-04-23 MED ORDER — TIOTROPIUM BROMIDE MONOHYDRATE 18 MCG IN CAPS: 18.0000 ug | ORAL_CAPSULE | Freq: Every day | RESPIRATORY_TRACT | Status: DC

## 2016-04-23 MED ORDER — FLUTICASONE-SALMETEROL 250-50 MCG/DOSE IN AEPB: 1.0000 | INHALATION_SPRAY | Freq: Two times a day (BID) | RESPIRATORY_TRACT | Status: DC

## 2016-05-03 ENCOUNTER — Other Ambulatory Visit: Payer: Self-pay | Admitting: Student in an Organized Health Care Education/Training Program

## 2016-05-04 ENCOUNTER — Other Ambulatory Visit: Payer: Self-pay | Admitting: Adult Health

## 2016-05-06 ENCOUNTER — Other Ambulatory Visit: Payer: Self-pay | Admitting: Student in an Organized Health Care Education/Training Program

## 2016-05-07 ENCOUNTER — Other Ambulatory Visit: Payer: Self-pay

## 2016-05-07 MED ORDER — OXYCODONE HCL 5 MG PO TABS: ORAL_TABLET | ORAL | Status: DC

## 2016-05-07 NOTE — Telephone Encounter (Signed)
Ok. I am going to approve one script. She will need to keep her appointment with me on 6/12 for further refills.

## 2016-05-07 NOTE — Telephone Encounter (Signed)
Called pt to inform, lm for rtc

## 2016-05-07 NOTE — Telephone Encounter (Signed)
Last filled at [facility] Next appt 6/12 Last UDS 07/2015

## 2016-05-07 NOTE — Telephone Encounter (Signed)
Pt daughter  requesting oxycodone to be filled.

## 2016-05-17 ENCOUNTER — Encounter: Payer: Self-pay | Admitting: Student in an Organized Health Care Education/Training Program

## 2016-05-17 ENCOUNTER — Ambulatory Visit (INDEPENDENT_AMBULATORY_CARE_PROVIDER_SITE_OTHER): Payer: Medicare HMO | Admitting: Student in an Organized Health Care Education/Training Program

## 2016-05-17 VITALS — BP 130/69 | HR 74 | Temp 98.0°F | Ht 63.0 in | Wt 210.4 lb

## 2016-05-17 DIAGNOSIS — I129 Hypertensive chronic kidney disease with stage 1 through stage 4 chronic kidney disease, or unspecified chronic kidney disease: Secondary | ICD-10-CM | POA: Diagnosis not present

## 2016-05-17 DIAGNOSIS — M25562 Pain in left knee: Secondary | ICD-10-CM

## 2016-05-17 DIAGNOSIS — M25561 Pain in right knee: Secondary | ICD-10-CM | POA: Diagnosis not present

## 2016-05-17 DIAGNOSIS — M542 Cervicalgia: Secondary | ICD-10-CM

## 2016-05-17 DIAGNOSIS — I10 Essential (primary) hypertension: Secondary | ICD-10-CM

## 2016-05-17 DIAGNOSIS — F1920 Other psychoactive substance dependence, uncomplicated: Secondary | ICD-10-CM

## 2016-05-17 DIAGNOSIS — F411 Generalized anxiety disorder: Secondary | ICD-10-CM

## 2016-05-17 DIAGNOSIS — I480 Paroxysmal atrial fibrillation: Secondary | ICD-10-CM

## 2016-05-17 DIAGNOSIS — G8929 Other chronic pain: Secondary | ICD-10-CM | POA: Diagnosis not present

## 2016-05-17 DIAGNOSIS — Z79899 Other long term (current) drug therapy: Secondary | ICD-10-CM

## 2016-05-17 DIAGNOSIS — Z79891 Long term (current) use of opiate analgesic: Secondary | ICD-10-CM

## 2016-05-17 DIAGNOSIS — N182 Chronic kidney disease, stage 2 (mild): Secondary | ICD-10-CM | POA: Diagnosis not present

## 2016-05-17 MED ORDER — OXYCODONE HCL 5 MG PO TABS: ORAL_TABLET | ORAL | Status: DC

## 2016-05-17 MED ORDER — ALPRAZOLAM 0.5 MG PO TABS: 0.5000 mg | ORAL_TABLET | Freq: Four times a day (QID) | ORAL | Status: DC | PRN

## 2016-05-17 NOTE — Assessment & Plan Note (Signed)
Difficult to treat. She has a terrible living situation and is now facing housing insecurity. She has relied on benzodiazepines for several years and I have had difficulty in weaning her off. Plan is to continue sertraline and I provided 3 month refill for alprazolam.

## 2016-05-17 NOTE — Assessment & Plan Note (Signed)
Blood pressure is at goal. Plan to continue hydrochlorothiazide 12.5 mg daily. She has had hypokalemia in the past so she is on a small supplementation of potassium which we can continue. She has CKD 2, so plan to check bmet today and every six months.

## 2016-05-17 NOTE — Assessment & Plan Note (Signed)
Functionally doing well with current dose of oxycodone. I clarified for her several times today that #105 tabs should last a full thirty days, and she can space them out as needed. Last tox screen was August 16 and appropriate. Database reviewed today and shows appropriate dispensing. We re-certified our pain contract today. She set functional goals of completing ADLs around the house, going to the store, and visiting family. I told her again that I advise against adding any other centrally acting medications like gabapentin or muscle relaxers in the future. Plan to continue oxycodone, I gave her three scripts of #105 to last three months, follow up with me in a clinic visit for all refills.

## 2016-05-17 NOTE — Assessment & Plan Note (Signed)
Symptomatically stable, rate well controlled with nifedipine. Anticoagulated with rivaroxaban for primary stroke prophylaxis. After discussion of risks and benefits I advised her to discontinue aspirin 81 mg which she had been on historically for cardiovascular disease primary prevention. I think the risk of dual antithrombotic therapy is too high for this primary prevention strategy.

## 2016-05-17 NOTE — Patient Instructions (Addendum)
1. Please take your pain and anxiety medications as prescribed. Each refill should last you thirty days. We will not be increasing these prescriptions or adding other medicines because of the high risk for side effects.   2. Stop taking Aspirin every night. Continue to use the Xarelto to prevent stroke.   3. We will check blood work today to make sure your kidney function and potassium levels are appropriate.

## 2016-05-17 NOTE — Progress Notes (Signed)
   See Encounters tab for problem-based medical decision making  __________________________________________________________  HPI:  75 year old woman here today for follow-up of chronic knee pain. Overall the patient looks great today, she is well dressed and appears to be in the best condition I seen her all year. She reports that she still has neck and pain in both of her knees. She uses oxycodone 3 or 4 times every day to help with the pain. She uses a walker to ambulate around the house. If she goes out of the house she will generally use a powered buggy to do her shopping. She denies any radicular pain. Feels unsteady on her feet but denies any falls since I last saw her. No recent fevers or chills. No blood in her stool or urine, does endorse very easy bruising her arms. Eating and drinking well. Her daughter accompanies her today. They both live with another daughter, who apparently told them both that they will have to leave her house soon. They're currently looking into finding another housing option through public housing in [place].  Reports compliance with alprazolam for anxiety attacks. She has she takes about 4 tablets each day to help her sleep at night and because she has sudden onset of panic randomly throughout the day. Says she ran out of the Xanax about 2 weeks ago because the pharmacy would not fill the new prescription before 40 days. I think this was an error on the instructions which I have fixed.  __________________________________________________________  Problem List: Patient Active Problem List   Diagnosis Date Noted  . Atherosclerosis of aorta ([place]) 10/20/2015  . Osteoarthritis of left knee 10/20/2015  . Chronic pain with drug dependence ([place]) 10/20/2015  . At high risk for falls 07/21/2015  . Abdominal aortic aneurysm ([place]) 05/29/2015  . Paroxysmal atrial fibrillation ([facility])   . Preventative health care 09/11/2013  . Diastolic heart failure ([place]) 11/06/2010  .  Osteopenia 10/23/2009  . Gastroesophageal reflux disease 04/19/2008  . Obstructive sleep apnea 04/17/2008  . Generalized osteoarthrosis 01/13/2007  . Generalized anxiety disorder 12/29/2006  . Depression 12/29/2006  . Essential hypertension 12/29/2006  . COPD with emphysema ([place]) 12/29/2006    Medications: Reconciled today in Epic __________________________________________________________  Physical Exam:  Vital Signs: Filed Vitals:   05/17/16 1106  BP: 130/69  Pulse: 74  Temp: 98 F (36.7 C)  TempSrc: Oral  Height: 5\' 3"  (1.6 m)  Weight: 210 lb 6.4 oz (95.437 kg)  SpO2: 98%    Gen: Well appearing, NAD CV: RRR, no murmurs Pulm: Normal effort, CTA throughout, no wheezing Abd: Soft, NT, ND, normal BS.  Ext: Warm, no edema, moderate crepitus in both knees Skin: No atypical appearing moles. No rashes

## 2016-05-18 LAB — BMP8+ANION GAP
ANION GAP: 15 mmol/L (ref 10.0–18.0)
BUN/Creatinine Ratio: 15 (ref 12–28)
BUN: 12 mg/dL (ref 8–27)
CALCIUM: 9.2 mg/dL (ref 8.7–10.3)
CO2: 21 mmol/L (ref 18–29)
CREATININE: 0.8 mg/dL (ref 0.57–1.00)
Chloride: 106 mmol/L (ref 96–106)
GFR calc Af Amer: 82 mL/min/{1.73_m2} (ref >59)
GFR, EST NON AFRICAN AMERICAN: 71 mL/min/{1.73_m2} (ref >59)
Glucose: 97 mg/dL (ref 65–99)
Potassium: 4.5 mmol/L (ref 3.5–5.2)
SODIUM: 142 mmol/L (ref 134–144)

## 2016-05-19 ENCOUNTER — Encounter: Payer: Self-pay | Admitting: Student in an Organized Health Care Education/Training Program

## 2016-05-25 ENCOUNTER — Telehealth: Payer: Self-pay

## 2016-05-25 NOTE — Telephone Encounter (Signed)
Patient was contacted  by Hailey Hill, PharmD candidate. I agree with the assessment and plan of care documented. 

## 2016-05-25 NOTE — Telephone Encounter (Signed)
Jennifer [name] is a 75 y.o. female who was contacted via telephone for monitoring of rivaroxaban (Xarelto) therapy.    ASSESSMENT Indication(s): atrial fibrillation Duration: indefinite  Labs:    Component Value Date/Time   AST 25 12/29/2015 1626   ALT 20 12/29/2015 1626   NA 142 05/17/2016 1155   NA 134* 06/09/2015 0341   K 4.5 05/17/2016 1155   CL 106 05/17/2016 1155   CO2 21 05/17/2016 1155   GLUCOSE 97 05/17/2016 1155   GLUCOSE 105* 06/09/2015 0341   HGBA1C 6.0* 05/29/2015 0530   HGBA1C 5.7 12/12/2014 1238   BUN 12 05/17/2016 1155   BUN <5* 06/09/2015 0341   CREATININE 0.80 05/17/2016 1155   CREATININE 0.61 12/12/2014 1228   CALCIUM 9.2 05/17/2016 1155   GFRNONAA 71 05/17/2016 1155   GFRNONAA 89 12/12/2014 1228   GFRAA 82 05/17/2016 1155   GFRAA >89 12/12/2014 1228   WBC 18.6* 12/29/2015 1626   WBC 8.9 06/09/2015 0341   HGB 8.4* 06/09/2015 0341   HCT 33.5* 12/29/2015 1626   HCT 25.5* 06/09/2015 0341   PLT 216 12/29/2015 1626   PLT 350 06/09/2015 0341    rivaroxaban (Xarelto) Dose: 20 mg daily  Safety: Patient has not had recent bleeding/thromboembolic events. Patient reports no recent signs or symptoms of bleeding, no signs of symptoms of thromboembolism. Medication changes: no.  Adherence: Patient reports no known adherence challenges. Patient does correctly recite the dose. Contacted pharmacy and records indicate refills are consistent. Refills (30 day supple): 3/28, 4/27, 5/31.  Patient Instructions: Patient advised to contact clinic or seek medical attention if signs/symptoms of bleeding or thromboembolism occur. Patient verbalized understanding by repeating back information.  Follow-up Next appointment no follow-up.   Angelena Form PharmD Candidate  05/25/2016, 1:03 PM

## 2016-05-26 ENCOUNTER — Other Ambulatory Visit: Payer: Self-pay | Admitting: Student in an Organized Health Care Education/Training Program

## 2016-05-27 ENCOUNTER — Other Ambulatory Visit: Payer: Self-pay | Admitting: Student in an Organized Health Care Education/Training Program

## 2016-06-01 ENCOUNTER — Other Ambulatory Visit: Payer: Self-pay | Admitting: *Deleted

## 2016-06-01 MED ORDER — AMITRIPTYLINE HCL 50 MG PO TABS: 50.0000 mg | ORAL_TABLET | Freq: Every day | ORAL | Status: DC

## 2016-06-01 NOTE — Telephone Encounter (Signed)
Requesting a 90 day supply.

## 2016-06-07 ENCOUNTER — Telehealth: Payer: Self-pay | Admitting: *Deleted

## 2016-06-07 NOTE — Telephone Encounter (Signed)
Requesting 90-day supply of amitriptyline HCL 50 mg. Tabs. Thanks!

## 2016-06-09 ENCOUNTER — Other Ambulatory Visit: Payer: Self-pay | Admitting: Student in an Organized Health Care Education/Training Program

## 2016-06-09 ENCOUNTER — Other Ambulatory Visit: Payer: Self-pay | Admitting: Internal Medicine

## 2016-06-09 ENCOUNTER — Other Ambulatory Visit: Payer: Self-pay | Admitting: Adult Health

## 2016-06-09 MED ORDER — AMITRIPTYLINE HCL 50 MG PO TABS: 50.0000 mg | ORAL_TABLET | Freq: Every day | ORAL | Status: DC

## 2016-06-18 ENCOUNTER — Other Ambulatory Visit: Payer: Self-pay | Admitting: Internal Medicine

## 2016-06-30 ENCOUNTER — Other Ambulatory Visit: Payer: Self-pay | Admitting: Internal Medicine

## 2016-07-08 ENCOUNTER — Other Ambulatory Visit: Payer: Self-pay | Admitting: Student in an Organized Health Care Education/Training Program

## 2016-07-15 ENCOUNTER — Other Ambulatory Visit: Payer: Self-pay | Admitting: Student in an Organized Health Care Education/Training Program

## 2016-07-15 ENCOUNTER — Other Ambulatory Visit: Payer: Self-pay | Admitting: Internal Medicine

## 2016-07-15 DIAGNOSIS — K219 Gastro-esophageal reflux disease without esophagitis: Secondary | ICD-10-CM

## 2016-07-30 ENCOUNTER — Telehealth: Payer: Self-pay | Admitting: Student in an Organized Health Care Education/Training Program

## 2016-07-30 NOTE — Telephone Encounter (Signed)
APT. REMINDER CALL, LMTCB °

## 2016-08-02 ENCOUNTER — Encounter: Payer: Medicare HMO | Admitting: Student in an Organized Health Care Education/Training Program

## 2016-08-02 ENCOUNTER — Other Ambulatory Visit: Payer: Self-pay | Admitting: Student in an Organized Health Care Education/Training Program

## 2016-08-02 MED ORDER — ALPRAZOLAM 0.5 MG PO TABS: 0.5000 mg | ORAL_TABLET | Freq: Four times a day (QID) | ORAL | 1 refills | Status: DC | PRN

## 2016-08-02 MED ORDER — OXYCODONE HCL 5 MG PO TABS: ORAL_TABLET | ORAL | 0 refills | Status: DC

## 2016-08-02 NOTE — Telephone Encounter (Signed)
I am disappointed she missed our office visit this morning. But I understand that she has housing and transportation insecurity. So I am going to approve two month supply of controlled meds and expect to see her at our next visit in October.

## 2016-08-02 NOTE — Telephone Encounter (Signed)
Received 3 scripts of Oxycodone on 05/17/16. Also last Xanax refill 6/12. Last OV 6/12. Next OV 09/06/16. UDS 07/21/15. Pain contract 05/18/16.

## 2016-08-02 NOTE — Telephone Encounter (Signed)
ALPRAZolam (XANAX) 0.5 MG tablet oxyCODONE (OXY IR/ROXICODONE) 5 MG immediate release tablet  Needs refills

## 2016-08-02 NOTE — Telephone Encounter (Signed)
Rx ready for pick up ; pt called. 

## 2016-08-10 ENCOUNTER — Other Ambulatory Visit: Payer: Self-pay | Admitting: *Deleted

## 2016-08-10 MED ORDER — ALBUTEROL SULFATE HFA 108 (90 BASE) MCG/ACT IN AERS: INHALATION_SPRAY | RESPIRATORY_TRACT | 1 refills | Status: DC

## 2016-08-10 MED ORDER — NIFEDIPINE ER OSMOTIC RELEASE 60 MG PO TB24: ORAL_TABLET | ORAL | 1 refills | Status: DC

## 2016-08-10 MED ORDER — RIVAROXABAN 20 MG PO TABS: ORAL_TABLET | ORAL | 1 refills | Status: DC

## 2016-08-10 NOTE — Telephone Encounter (Signed)
Received 90-day supply refill request from pt's pharmacy for the following medications: nifedipine er, xarelto, and proair.  Will send to pcp for review.Despina Hidden Cassady9/5/201712:03 PM

## 2016-09-03 ENCOUNTER — Other Ambulatory Visit: Payer: Self-pay | Admitting: Student in an Organized Health Care Education/Training Program

## 2016-09-06 ENCOUNTER — Ambulatory Visit (INDEPENDENT_AMBULATORY_CARE_PROVIDER_SITE_OTHER): Payer: Medicare HMO | Admitting: Student in an Organized Health Care Education/Training Program

## 2016-09-06 ENCOUNTER — Encounter: Payer: Self-pay | Admitting: Student in an Organized Health Care Education/Training Program

## 2016-09-06 VITALS — BP 138/76 | HR 86 | Temp 97.8°F | Ht 63.0 in | Wt 212.6 lb

## 2016-09-06 DIAGNOSIS — Z7951 Long term (current) use of inhaled steroids: Secondary | ICD-10-CM

## 2016-09-06 DIAGNOSIS — Z23 Encounter for immunization: Secondary | ICD-10-CM

## 2016-09-06 DIAGNOSIS — J439 Emphysema, unspecified: Secondary | ICD-10-CM

## 2016-09-06 DIAGNOSIS — Z87891 Personal history of nicotine dependence: Secondary | ICD-10-CM

## 2016-09-06 DIAGNOSIS — Z9181 History of falling: Secondary | ICD-10-CM

## 2016-09-06 DIAGNOSIS — I480 Paroxysmal atrial fibrillation: Secondary | ICD-10-CM

## 2016-09-06 DIAGNOSIS — F331 Major depressive disorder, recurrent, moderate: Secondary | ICD-10-CM

## 2016-09-06 DIAGNOSIS — J449 Chronic obstructive pulmonary disease, unspecified: Secondary | ICD-10-CM

## 2016-09-06 DIAGNOSIS — M47816 Spondylosis without myelopathy or radiculopathy, lumbar region: Secondary | ICD-10-CM

## 2016-09-06 DIAGNOSIS — R296 Repeated falls: Secondary | ICD-10-CM

## 2016-09-06 DIAGNOSIS — G8929 Other chronic pain: Secondary | ICD-10-CM

## 2016-09-06 DIAGNOSIS — K219 Gastro-esophageal reflux disease without esophagitis: Secondary | ICD-10-CM

## 2016-09-06 DIAGNOSIS — I5032 Chronic diastolic (congestive) heart failure: Secondary | ICD-10-CM

## 2016-09-06 DIAGNOSIS — F329 Major depressive disorder, single episode, unspecified: Secondary | ICD-10-CM

## 2016-09-06 DIAGNOSIS — I10 Essential (primary) hypertension: Secondary | ICD-10-CM | POA: Diagnosis not present

## 2016-09-06 DIAGNOSIS — F1920 Other psychoactive substance dependence, uncomplicated: Secondary | ICD-10-CM

## 2016-09-06 DIAGNOSIS — M159 Polyosteoarthritis, unspecified: Secondary | ICD-10-CM

## 2016-09-06 DIAGNOSIS — Z79891 Long term (current) use of opiate analgesic: Secondary | ICD-10-CM

## 2016-09-06 MED ORDER — OXYCODONE HCL 5 MG PO TABS: ORAL_TABLET | ORAL | 0 refills | Status: DC

## 2016-09-06 MED ORDER — MECLIZINE HCL 12.5 MG PO TABS: 12.5000 mg | ORAL_TABLET | Freq: Two times a day (BID) | ORAL | 2 refills | Status: DC | PRN

## 2016-09-06 MED ORDER — ALPRAZOLAM 0.5 MG PO TABS: 0.5000 mg | ORAL_TABLET | Freq: Four times a day (QID) | ORAL | 2 refills | Status: DC | PRN

## 2016-09-06 MED ORDER — ALBUTEROL SULFATE HFA 108 (90 BASE) MCG/ACT IN AERS: INHALATION_SPRAY | RESPIRATORY_TRACT | 2 refills | Status: DC

## 2016-09-06 NOTE — Assessment & Plan Note (Signed)
Symptomatically doing well on Advair and Spiriva. She uses albuterol a few times a week, mostly for cough suppression. I refilled this for 2 inhalers 2 ideally last about 3 months. She will call me if she is using rescue inhaler more.

## 2016-09-06 NOTE — Assessment & Plan Note (Addendum)
Atrial fibrillation was diagnosed in 2016 during an hospitalization for acute cholecystitis. She was anticoagulated at time with rivaroxaban. Echocardiography showed a structurally normal heart with only mild dilation of the left atrium. In our office she has always been in normal sinus rhythm. At this point she is having frequent falls at home leading to significant bruising which is bothering her. We talked about the risks and benefits of continuing anticoagulation. I think it is most likely that the atrial fibrillation was driven by the acute illness during that hospitalization and given her relatively normal echocardiogram she probably is low risk for paroxysmal atrial fibrillation. Unfortunately she has financial barriers to being able to do an ambulatory heart monitor. We made a shared decision today to discontinue anticoagulation given her ongoing risk for falls and recent extensive bruising. I will continue nifedipine for now for her blood pressure control. We will restart aspirin 81 mg for primary cardiovascular disease prevention.

## 2016-09-06 NOTE — Assessment & Plan Note (Signed)
Patient self discontinued hydrochlorothiazide since our last visit. Her blood pressure is well-controlled at our office today and I think it's fine to continue to hold this medication. She is going to continue nifedipine 60 mg once daily. I also advised her to discontinue supplemental potassium if she is not going to take the thiazide diuretic.

## 2016-09-06 NOTE — Assessment & Plan Note (Addendum)
Chronic pain generator is osteoarthritis of the lower back. Functionally she is doing well at current dose of oxycodone every day. I gave her 3 prescriptions for #105 to last 3 months, she'll follow-up with me in a clinic visit for all refills. Urine toxicology screen obtained today, last dose of oxycodone reportedly this morning. She is having falls at home likely related to vertigo, we will have to watch this very closely.

## 2016-09-06 NOTE — Assessment & Plan Note (Signed)
2 additional falls since our last clinic visit. 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 I advised her that polypharmacy is also likely contributing. She does have adequate medical equipment at home including a rolling walker. We have been unable to refer her to physical therapy or obtain a DEXA screen because of financial limitations. We will continue the meclizine for her intermittent vertigo. I will continue to monitor very closely and if the risks of the oxycodone and alprazolam combination continue to rise we will have to taper one or both.

## 2016-09-06 NOTE — Progress Notes (Signed)
Assessment and Plan:  See Encounters tab for problem-based medical decision making.   __________________________________________________________  HPI:  75 year old woman comes in today for follow-up of chronic pain. She reports that her lower back pain is well controlled with current oxycodone regimen. She is able to ambulate around her home and complete simple functional tasks. She has had 2 falls since our last clinic visit. She reports that both were related to sudden onset of vertigo which she has struggled with for over 10 years. 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 After one incidence she fell in her bathroom to the ground. Her daughter reports that she had extensive bruising on her right arm and right thigh for several weeks afterwards. They deny any overt bleeding. Denies any episodes of confusion, denied any trauma to her head. She reports good compliance with her medications. She's made no other recent adjustments. She continues to live with her daughter who is her primary caretaker. She ambulates with a rolling walker or a cane as needed.  __________________________________________________________  Problem List: Patient Active Problem List   Diagnosis Date Noted  . Chronic pain with drug dependence ([place]) 10/20/2015    Priority: High  . At high risk for falls 07/21/2015    Priority: High  . Atherosclerosis of aorta ([place]) 10/20/2015    Priority: Medium  . Abdominal aortic aneurysm ([place]) 05/29/2015    Priority: Medium  . Paroxysmal atrial fibrillation ([facility])     Priority: Medium  . Preventative health care 09/11/2013    Priority: Medium  . Osteopenia 10/23/2009    Priority: Medium  . Obstructive sleep apnea 04/17/2008    Priority: Medium  . Generalized osteoarthrosis 01/13/2007    Priority: Medium  . Generalized anxiety disorder 12/29/2006    Priority: Medium  . Depression 12/29/2006    Priority: Medium  . COPD with emphysema ([place]) 12/29/2006    Priority:  Medium  . Gastroesophageal reflux disease 04/19/2008    Priority: Low  . Essential hypertension 12/29/2006    Priority: Low    Medications: Reconciled today in Epic __________________________________________________________  Physical Exam:  Vital Signs: Vitals:   09/06/16 0824  BP: 138/76  Pulse: 86  Temp: 97.8 F (36.6 C)  TempSrc: Oral  SpO2: 99%  Weight: 212 lb 9.6 oz (96.4 kg)  Height: 5\' 3"  (1.6 m)    Gen: Well appearing, NAD CV: RRR, 2 out of 6 early systolic murmur at the right upper sternal border. Pulm: Normal effort, CTA throughout, no wheezing Abd: Soft, NT, ND, normal BS.  Ext: Warm, no edema, normal joints Skin: No atypical appearing moles. No rashes. Senile purpura on both arms.

## 2016-09-06 NOTE — Assessment & Plan Note (Signed)
Symptomatically stable with sertraline 100 mg daily which we will continue. Also uses alprazolam 0.5 mg as needed every 6 hours. #120 pills lasts for 1 month. I provided her with a 3 month refill today and obtained a urine tox screen.

## 2016-09-06 NOTE — Patient Instructions (Signed)
1. Stop taking Xarelto. Start taking aspirin 81 mg daily.   2. Continue Nifedipine at current dose. You may stop potassium pills.   3. Call me if you have any further falls at home.

## 2016-09-08 ENCOUNTER — Other Ambulatory Visit: Payer: Self-pay | Admitting: Student in an Organized Health Care Education/Training Program

## 2016-09-09 ENCOUNTER — Other Ambulatory Visit: Payer: Self-pay | Admitting: Student in an Organized Health Care Education/Training Program

## 2016-09-14 LAB — TOXASSURE SELECT,+ANTIDEPR,UR

## 2016-10-07 ENCOUNTER — Other Ambulatory Visit: Payer: Self-pay | Admitting: Student in an Organized Health Care Education/Training Program

## 2016-10-27 ENCOUNTER — Other Ambulatory Visit: Payer: Self-pay | Admitting: Internal Medicine

## 2016-11-04 ENCOUNTER — Other Ambulatory Visit: Payer: Self-pay

## 2016-11-04 ENCOUNTER — Telehealth: Payer: Self-pay

## 2016-11-04 NOTE — Telephone Encounter (Signed)
oxyCODONE (OXY IR/ROXICODONE) 5 MG , refill request.

## 2016-11-04 NOTE — Telephone Encounter (Signed)
Last visit:09/06/2016 Last UDS: 09/06/2016 Next appointment: 11/22/2016 Last refill: 10/18/2016

## 2016-11-05 ENCOUNTER — Other Ambulatory Visit: Payer: Self-pay

## 2016-11-05 NOTE — Telephone Encounter (Signed)
Pt want to know if pain Rx is ready. Please call back.

## 2016-11-05 NOTE — Telephone Encounter (Signed)
Spoke with daughter earlier she did not remember receiving 3 rx for her mother told her that she definitely was given 3 she thinks they may be misplaced told her that I would check to see if they had be filled. Called back daughter not available spoke with patient told her 3 RX  for oxy was filled on 09/13/2016, 10/01/2016 and 10/18/2016 MD will not grant new Rx for oxy at this time advised her that MD said she should be refilling medication every 30 days not every 18 days and it was discussed prior to her receiving  Oxy RX. Appointment changed from 12/18 to 12/11 0845 patient verbalized understanding and denies further concerns or request at this time

## 2016-11-15 ENCOUNTER — Ambulatory Visit (HOSPITAL_COMMUNITY)
Admission: RE | Admit: 2016-11-15 | Discharge: 2016-11-15 | Disposition: A | Discharge status: Home / Self Care | Payer: Medicare HMO | Source: Ambulatory Visit | Attending: Student in an Organized Health Care Education/Training Program | Admitting: Student in an Organized Health Care Education/Training Program

## 2016-11-15 ENCOUNTER — Encounter: Payer: Self-pay | Admitting: Student in an Organized Health Care Education/Training Program

## 2016-11-15 ENCOUNTER — Ambulatory Visit (INDEPENDENT_AMBULATORY_CARE_PROVIDER_SITE_OTHER): Payer: Medicare HMO | Admitting: Student in an Organized Health Care Education/Training Program

## 2016-11-15 VITALS — BP 117/59 | HR 83 | Temp 98.0°F | Ht 63.0 in | Wt 206.7 lb

## 2016-11-15 DIAGNOSIS — M5136 Other intervertebral disc degeneration, lumbar region: Secondary | ICD-10-CM | POA: Insufficient documentation

## 2016-11-15 DIAGNOSIS — G8929 Other chronic pain: Principal | ICD-10-CM

## 2016-11-15 DIAGNOSIS — J438 Other emphysema: Secondary | ICD-10-CM

## 2016-11-15 DIAGNOSIS — M47816 Spondylosis without myelopathy or radiculopathy, lumbar region: Secondary | ICD-10-CM | POA: Diagnosis not present

## 2016-11-15 DIAGNOSIS — M159 Polyosteoarthritis, unspecified: Secondary | ICD-10-CM

## 2016-11-15 DIAGNOSIS — Z9989 Dependence on other enabling machines and devices: Secondary | ICD-10-CM

## 2016-11-15 DIAGNOSIS — F329 Major depressive disorder, single episode, unspecified: Secondary | ICD-10-CM

## 2016-11-15 DIAGNOSIS — M170 Bilateral primary osteoarthritis of knee: Secondary | ICD-10-CM

## 2016-11-15 DIAGNOSIS — I10 Essential (primary) hypertension: Secondary | ICD-10-CM

## 2016-11-15 DIAGNOSIS — M4316 Spondylolisthesis, lumbar region: Secondary | ICD-10-CM | POA: Insufficient documentation

## 2016-11-15 DIAGNOSIS — Z9181 History of falling: Secondary | ICD-10-CM

## 2016-11-15 DIAGNOSIS — J439 Emphysema, unspecified: Secondary | ICD-10-CM

## 2016-11-15 DIAGNOSIS — F331 Major depressive disorder, recurrent, moderate: Secondary | ICD-10-CM

## 2016-11-15 DIAGNOSIS — Z79891 Long term (current) use of opiate analgesic: Secondary | ICD-10-CM

## 2016-11-15 DIAGNOSIS — M4186 Other forms of scoliosis, lumbar region: Secondary | ICD-10-CM | POA: Insufficient documentation

## 2016-11-15 DIAGNOSIS — F1920 Other psychoactive substance dependence, uncomplicated: Secondary | ICD-10-CM

## 2016-11-15 DIAGNOSIS — R296 Repeated falls: Secondary | ICD-10-CM

## 2016-11-15 DIAGNOSIS — Z79899 Other long term (current) drug therapy: Secondary | ICD-10-CM

## 2016-11-15 DIAGNOSIS — Z87891 Personal history of nicotine dependence: Secondary | ICD-10-CM

## 2016-11-15 MED ORDER — OXYCODONE HCL 5 MG PO TABS: ORAL_TABLET | ORAL | 0 refills | Status: DC

## 2016-11-15 MED ORDER — UMECLIDINIUM BROMIDE 62.5 MCG/INH IN AEPB: 1.0000 | INHALATION_SPRAY | Freq: Every day | RESPIRATORY_TRACT | 5 refills | Status: DC

## 2016-11-15 MED ORDER — NIFEDIPINE ER OSMOTIC RELEASE 60 MG PO TB24: ORAL_TABLET | ORAL | 3 refills | Status: DC

## 2016-11-15 MED ORDER — MECLIZINE HCL 12.5 MG PO TABS: 12.5000 mg | ORAL_TABLET | Freq: Two times a day (BID) | ORAL | 2 refills | Status: DC | PRN

## 2016-11-15 MED ORDER — ALBUTEROL SULFATE HFA 108 (90 BASE) MCG/ACT IN AERS: 2.0000 | INHALATION_SPRAY | Freq: Four times a day (QID) | RESPIRATORY_TRACT | 5 refills | Status: DC | PRN

## 2016-11-15 MED ORDER — FLUTICASONE-SALMETEROL 250-50 MCG/DOSE IN AEPB: 1.0000 | INHALATION_SPRAY | Freq: Two times a day (BID) | RESPIRATORY_TRACT | 2 refills | Status: DC

## 2016-11-15 MED ORDER — ALPRAZOLAM 0.5 MG PO TABS: 0.5000 mg | ORAL_TABLET | Freq: Four times a day (QID) | ORAL | 2 refills | Status: DC | PRN

## 2016-11-15 NOTE — Assessment & Plan Note (Signed)
Blood pressure is well controlled on nifedipine. Will continue with current dosing. Continue to hold HCTZ.

## 2016-11-15 NOTE — Assessment & Plan Note (Signed)
Symptomatically stable. Continue sertraline at current dose. She has a lot of anxiety features. We will continue alprazolam 0.5 mg every 6 hours. This has been a history of medication on which she has done very well. I gave her a 3 month supply today.

## 2016-11-15 NOTE — Addendum Note (Signed)
Addended by: Lalla Brothers T on: 11/15/2016 04:39 PM   Modules accepted: Orders

## 2016-11-15 NOTE — Progress Notes (Signed)
Assessment and Plan:  See Encounters tab for problem-based medical decision making.   __________________________________________________________  HPI:  75 year old woman here for follow-up of chronic pain due to osteoarthritis of the lumbar spine. She reports that her pain has been worse over the last few weeks. She had one fall out of bed since I last saw her. She lives on the third floor of a townhouse with her daughter. She uses a cane because her house is not big enough for her walker. No other falls while walking. Reports good compliance with her medications. Reports good functional benefit when taking oxycodone. She did take it upon herself to increase her frequency of using this medication. She has been refilling it every 18 days instead of every 30 days as we had agreed upon. She reports the pain comes and goes as usual. Denies fevers or chills. No dyspnea with exertion. No chest pain. She uses CPAP at night and is worried about her ability to use his machine if her Powers turned off because of late payment.  __________________________________________________________  Problem List: Patient Active Problem List   Diagnosis Date Noted  . Chronic pain with drug dependence ([place]) 10/20/2015    Priority: High  . At high risk for falls 07/21/2015    Priority: High  . Atherosclerosis of aorta ([place]) 10/20/2015    Priority: Medium  . Abdominal aortic aneurysm ([place]) 05/29/2015    Priority: Medium  . Paroxysmal atrial fibrillation ([facility])     Priority: Medium  . Preventative health care 09/11/2013    Priority: Medium  . Osteopenia 10/23/2009    Priority: Medium  . Obstructive sleep apnea 04/17/2008    Priority: Medium  . Generalized osteoarthrosis 01/13/2007    Priority: Medium  . Generalized anxiety disorder 12/29/2006    Priority: Medium  . Depression 12/29/2006    Priority: Medium  . COPD with emphysema ([place]) 12/29/2006    Priority: Medium  . Gastroesophageal reflux disease  04/19/2008    Priority: Low  . Essential hypertension 12/29/2006    Priority: Low    Medications: Reconciled today in Epic __________________________________________________________  Physical Exam:  Vital Signs: Vitals:   11/15/16 0924  BP: (!) 117/59  Pulse: 83  Temp: 98 F (36.7 C)  TempSrc: Oral  SpO2: 97%  Weight: 206 lb 11.2 oz (93.8 kg)  Height: 5\' 3"  (1.6 m)    Gen: Well appearing, NAD CV: RRR, no murmurs Pulm: Normal effort, CTA throughout, no wheezing Abd: Soft, NT, ND, normal BS.  Ext: Warm, no edema, Left knee with tenderness to palpation and crepitus with passive range of motion, right knee with mild tenderness to palpation. Lower back is without point tenderness over the vertebral processes. Neuro: 5 out of 5 strength in both lower extremities. Decreased reflexes in both knees. Normal mentation, conversational. Cranial nerves grossly intact. Skin: No atypical appearing moles. No rashes

## 2016-11-15 NOTE — Assessment & Plan Note (Signed)
Clinically stable with no recent COPD exacerbations. Letter from the patient's insurance carrier requests formulary changes. Continue advair. Change Proventil to Ventolin rescue inhaler. Change antimuscarinic from Spiriva to Incruse.

## 2016-11-15 NOTE — Assessment & Plan Note (Signed)
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 She has been on oxycodone pain management for many years. We have a controlled medication contract from June scanned into the media tab. She was well controlled on oxycodone 5 mg #105 tablets for 30 day supply, this was a modest decrease from last year when she was having significant falls. In October she somehow convinced her pharmacy to fill this prescription every 18 days instead of every 30. As a result she has run out of her medicines early. I told her I consider this a violation of our pain contract. Her pain is still consistent, no new trauma to suggest an acute pain generator. She asked for a higher pill counts, I told her I think that is medically unsafe given her frequent falls. I told her I think we are dealing with narcotic dependence and tolerance. Her urine tox screens have been appropriate and I checked the control database today which shows that I am her only prescriber.   I'm willing to give her one more chance, this is her final warning. She should maintain the pain contract agreement we have. I gave her a 3 month supply of oxycodone 5mg  #105 for 30 days with very clear instructions. [facility] should not refill this early, she should follow with me in an office visit for all refills.

## 2016-11-15 NOTE — Patient Instructions (Signed)
1. Take all your medications as discussed.   2. Each pain medicine prescription should last you 30 days. I will not be able to refill oxycodone early for any reason in the future.   3. Please have an xray done of your lower back today to rule out compression fractures of the spine.   4. I have changed your COPD inhaler medicines based on your insurance preference.

## 2016-11-15 NOTE — Assessment & Plan Note (Signed)
Chronic lower back pain reportedly worse last few weeks. History of osteopenia, last DEXA 2010 and has been unable to repeat it because of financial limitations. We obtained plain film x-ray of the lumbar spine which revealed out compression fracture. I think this is just progression of her osteoarthritis and degenerative disc disease causing chronic pain.

## 2016-11-15 NOTE — Assessment & Plan Note (Deleted)
Clinically stable with no recent COPD exacerbations. Letter from the patient's insurance carrier requests formulary changes. Continue advair. Change Proventil to Ventolin rescue inhaler. Change antimuscarinic from Spiriva to Incruse.

## 2016-11-22 ENCOUNTER — Encounter: Payer: Medicare HMO | Admitting: Student in an Organized Health Care Education/Training Program

## 2016-11-22 DIAGNOSIS — J449 Chronic obstructive pulmonary disease, unspecified: Secondary | ICD-10-CM | POA: Diagnosis not present

## 2016-11-22 DIAGNOSIS — G8929 Other chronic pain: Secondary | ICD-10-CM | POA: Diagnosis not present

## 2016-11-22 DIAGNOSIS — I729 Aneurysm of unspecified site: Secondary | ICD-10-CM | POA: Diagnosis not present

## 2016-11-22 DIAGNOSIS — J9620 Acute and chronic respiratory failure, unspecified whether with hypoxia or hypercapnia: Secondary | ICD-10-CM | POA: Diagnosis not present

## 2016-11-22 DIAGNOSIS — G4733 Obstructive sleep apnea (adult) (pediatric): Secondary | ICD-10-CM | POA: Diagnosis not present

## 2016-11-23 DIAGNOSIS — J449 Chronic obstructive pulmonary disease, unspecified: Secondary | ICD-10-CM | POA: Diagnosis not present

## 2016-11-23 DIAGNOSIS — G8929 Other chronic pain: Secondary | ICD-10-CM | POA: Diagnosis not present

## 2016-11-23 DIAGNOSIS — G4733 Obstructive sleep apnea (adult) (pediatric): Secondary | ICD-10-CM | POA: Diagnosis not present

## 2016-11-23 DIAGNOSIS — I729 Aneurysm of unspecified site: Secondary | ICD-10-CM | POA: Diagnosis not present

## 2016-11-23 DIAGNOSIS — J9620 Acute and chronic respiratory failure, unspecified whether with hypoxia or hypercapnia: Secondary | ICD-10-CM | POA: Diagnosis not present

## 2016-12-09 ENCOUNTER — Telehealth: Payer: Self-pay | Admitting: Student in an Organized Health Care Education/Training Program

## 2016-12-09 DIAGNOSIS — G4733 Obstructive sleep apnea (adult) (pediatric): Secondary | ICD-10-CM

## 2016-12-09 NOTE — Telephone Encounter (Signed)
It looks like she had a sleep study in April 2009. I don't have the results readily available. Clinic visit note from 2013 says she uses a pressure level of 17. I am not sure how much O2 at night. I will place the order, let me know if it needs adjustment.

## 2016-12-09 NOTE — Telephone Encounter (Signed)
Patient is requesting a New CPAP MACHINE with Supplies and would like an order placed.  Please advise.

## 2017-01-13 ENCOUNTER — Other Ambulatory Visit: Payer: Self-pay | Admitting: Student in an Organized Health Care Education/Training Program

## 2017-02-04 ENCOUNTER — Other Ambulatory Visit: Payer: Self-pay

## 2017-02-04 NOTE — Telephone Encounter (Signed)
ALPRAZolam (XANAX) 0.5 MG tablet, oxyCODONE (OXY IR/ROXICODONE) 5, REFILL REQUEST.

## 2017-02-07 MED ORDER — OXYCODONE HCL 5 MG PO TABS: ORAL_TABLET | ORAL | 0 refills | Status: DC

## 2017-02-07 MED ORDER — ALPRAZOLAM 0.5 MG PO TABS: 0.5000 mg | ORAL_TABLET | Freq: Four times a day (QID) | ORAL | 1 refills | Status: DC | PRN

## 2017-02-07 NOTE — Telephone Encounter (Signed)
Tried to call, lm for rtc 

## 2017-02-07 NOTE — Telephone Encounter (Signed)
Two month refills for oxycodone and alprazolam signed and delivered to triage for pick up. Her last rx was 12/11, I reviewed the database which was appropriate. I have told Jennifer [name] several times that I would greatly prefer to refill these meds within a visit, but I understand that she had difficulty with transportation. Our follow up appt is 4/30.

## 2017-02-24 ENCOUNTER — Encounter: Payer: Self-pay | Admitting: Student in an Organized Health Care Education/Training Program

## 2017-03-02 ENCOUNTER — Telehealth: Payer: Self-pay | Admitting: *Deleted

## 2017-03-02 NOTE — Telephone Encounter (Signed)
[employer] and [employer] was called for Omeprazole denial.  Omeprazole was denied due to twice daily dosing.  PA information was given.  Patient has a diagnosis of GERD and has tried and failed 1 day dosing as well as other meds for GERD.   Approved 12/06/2016 thru 11/05/2017.  Sander Nephew, RN 03/02/2017 11:40 AM

## 2017-04-01 ENCOUNTER — Telehealth: Payer: Self-pay | Admitting: Student in an Organized Health Care Education/Training Program

## 2017-04-01 NOTE — Telephone Encounter (Signed)
APT. REMINDER CALL, NO ANSWER, NO VOICEMAIL °

## 2017-04-02 IMAGING — CR DG CHEST 2V
2 series · 2 of 2 positions shown · non-contrast
Comparison: 01/08/2014

CLINICAL DATA: Recent tripping injury with persistence shortness of
Breath

EXAM:
CHEST  2 VIEW

[chest pa]
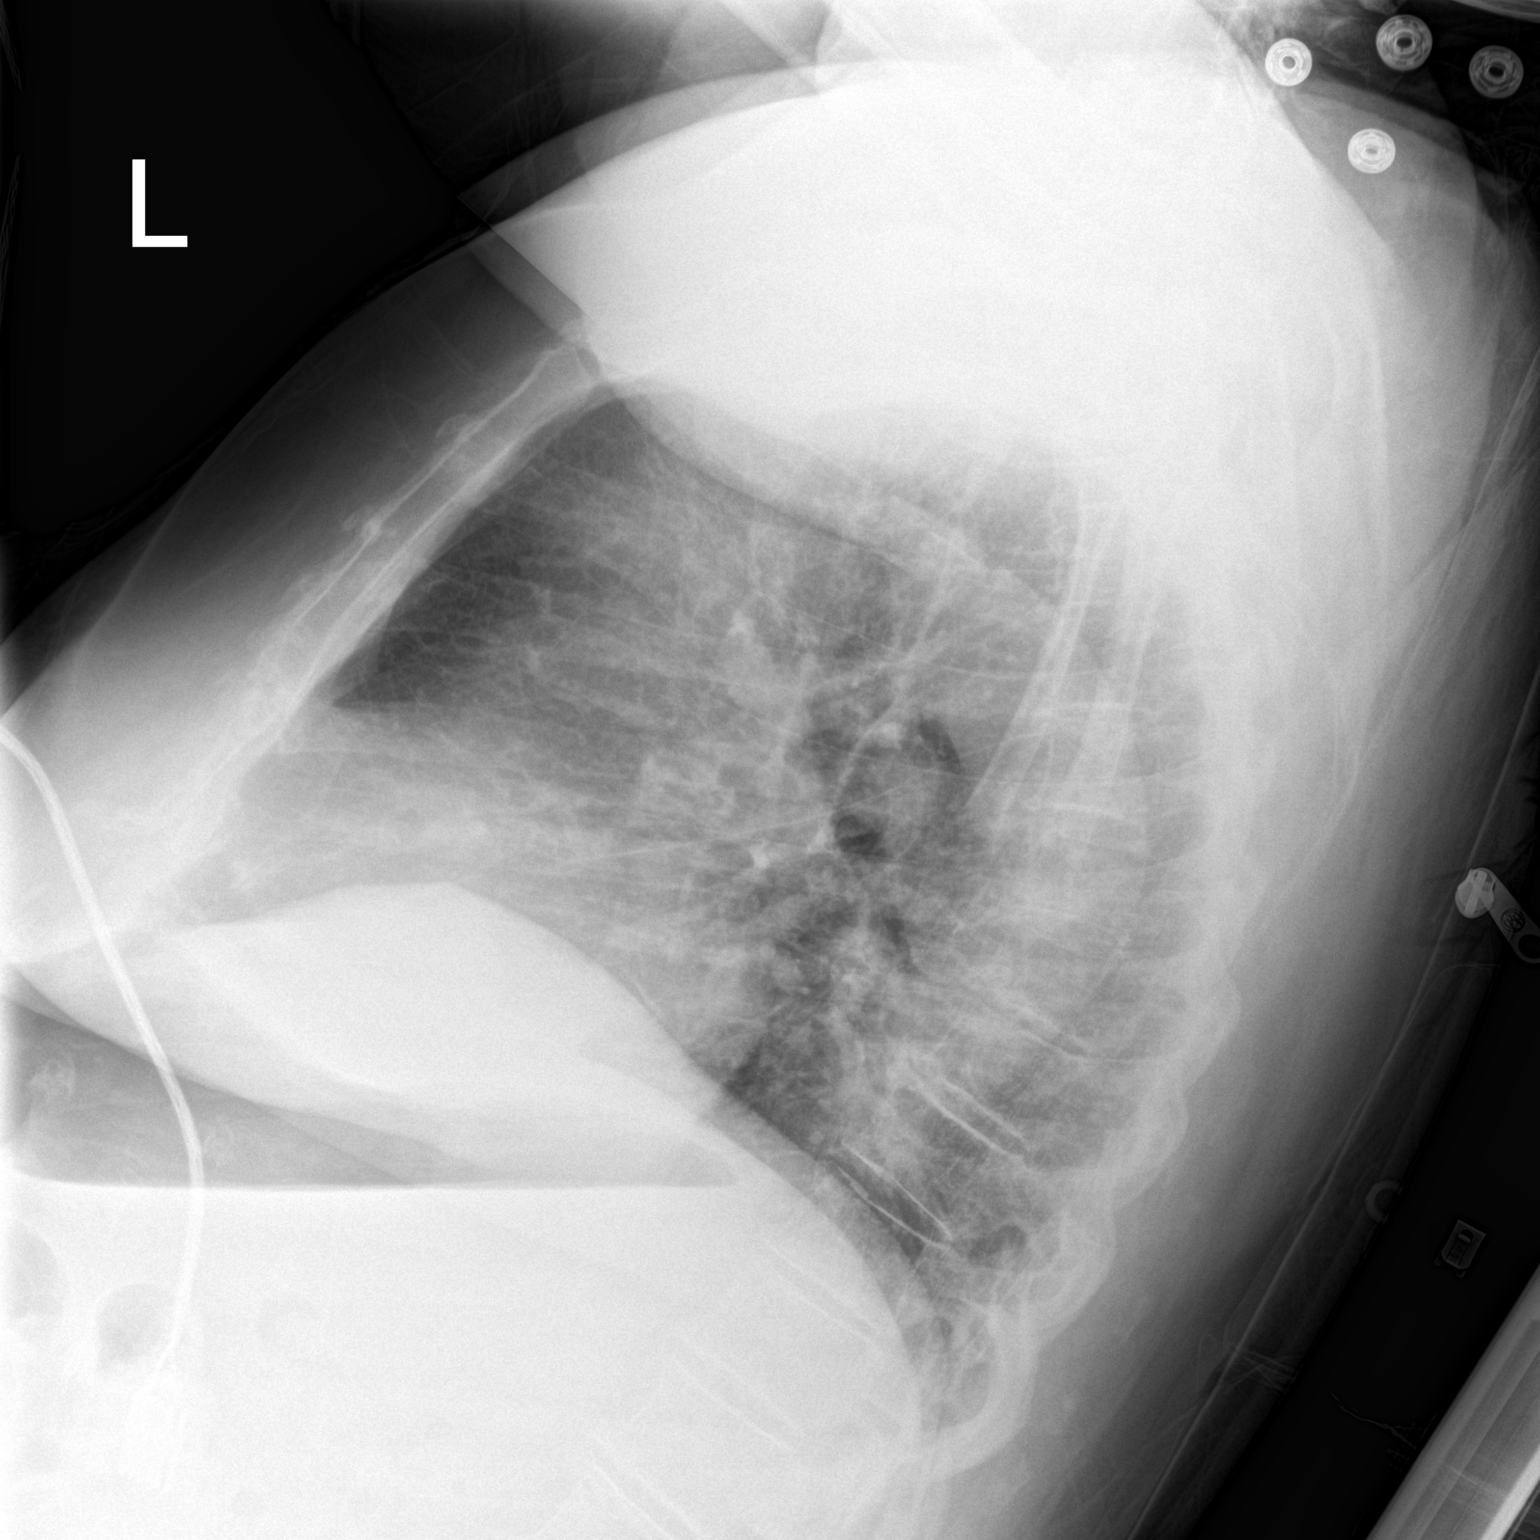

[chest ap]
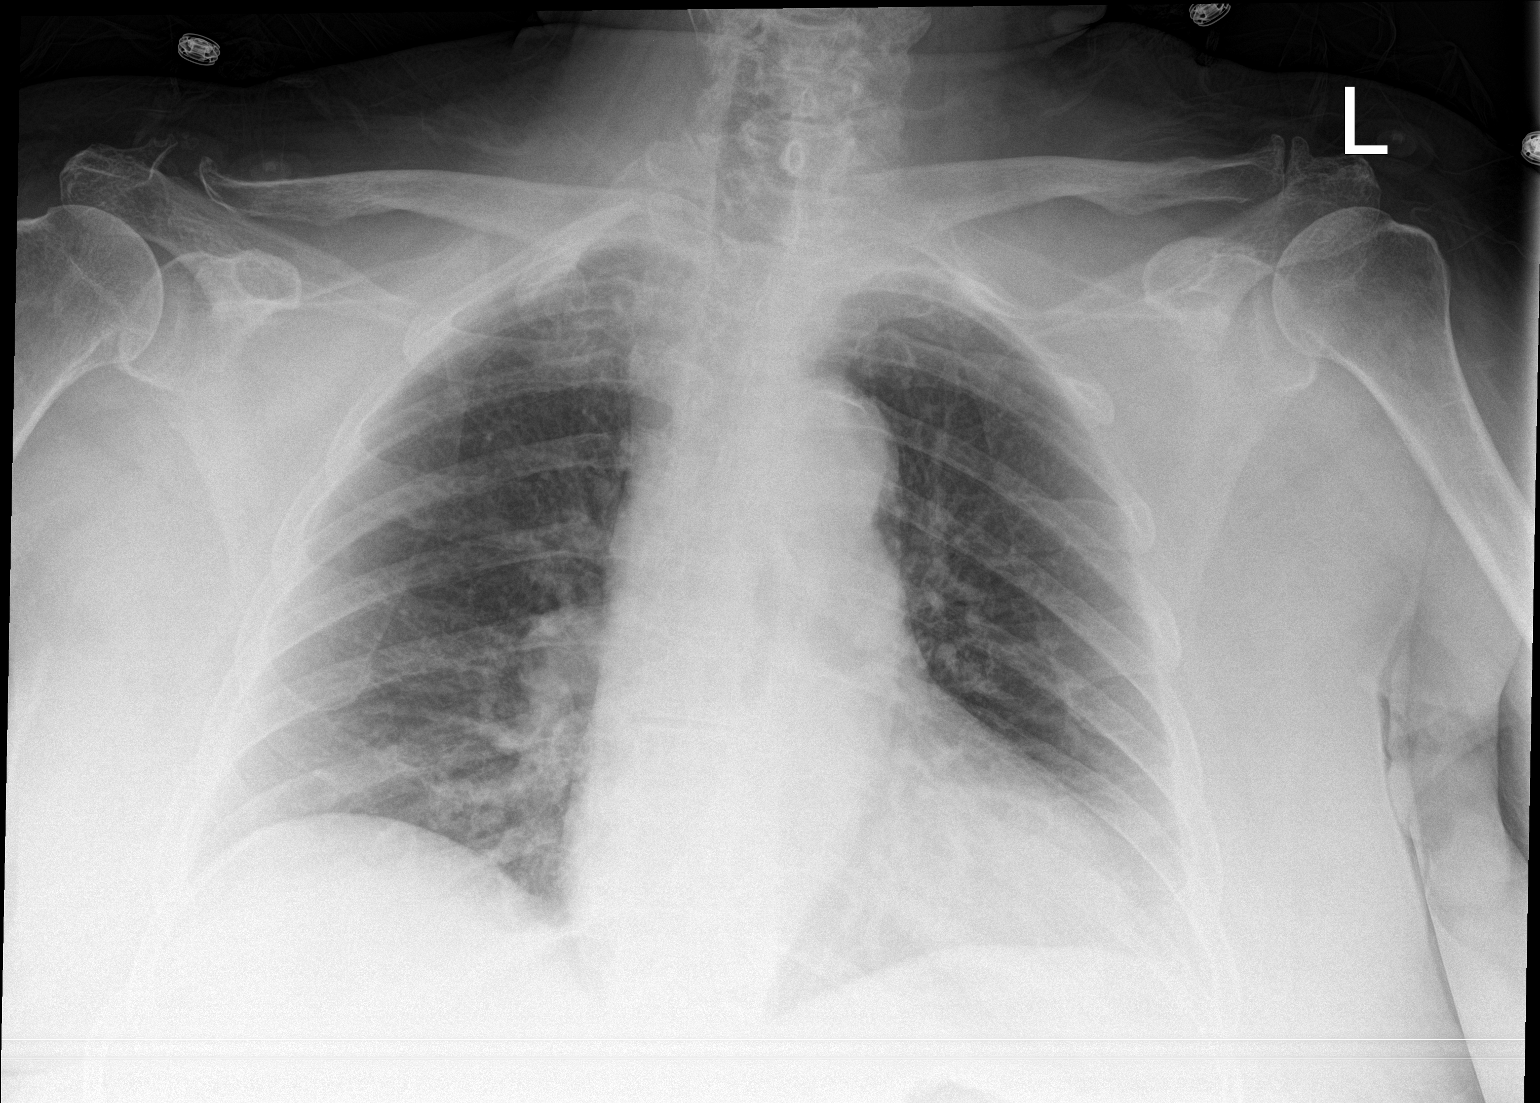

[2 of 2 positions shown; findings below may reference images not displayed]

FINDINGS: Cardiac shadow is stable. The lungs are clear without evidence of
pneumothorax. No focal infiltrate is seen. No acute bony abnormality
is noted. No pneumothorax is noted. Chronic degenerative changes of
the acromioclavicular joints are seen.
IMPRESSION: No acute abnormality noted.

## 2017-04-03 IMAGING — XA IR CHOLECYSTOSTOMY
1 series · 2 of 2 positions shown · non-contrast
Comparison: none

CLINICAL DATA: 74-year-old with acute cholecystitis and not a good
surgical candidate at this time.

[Series 1: run · 2 of 2 slices shown]
[im 1/2]
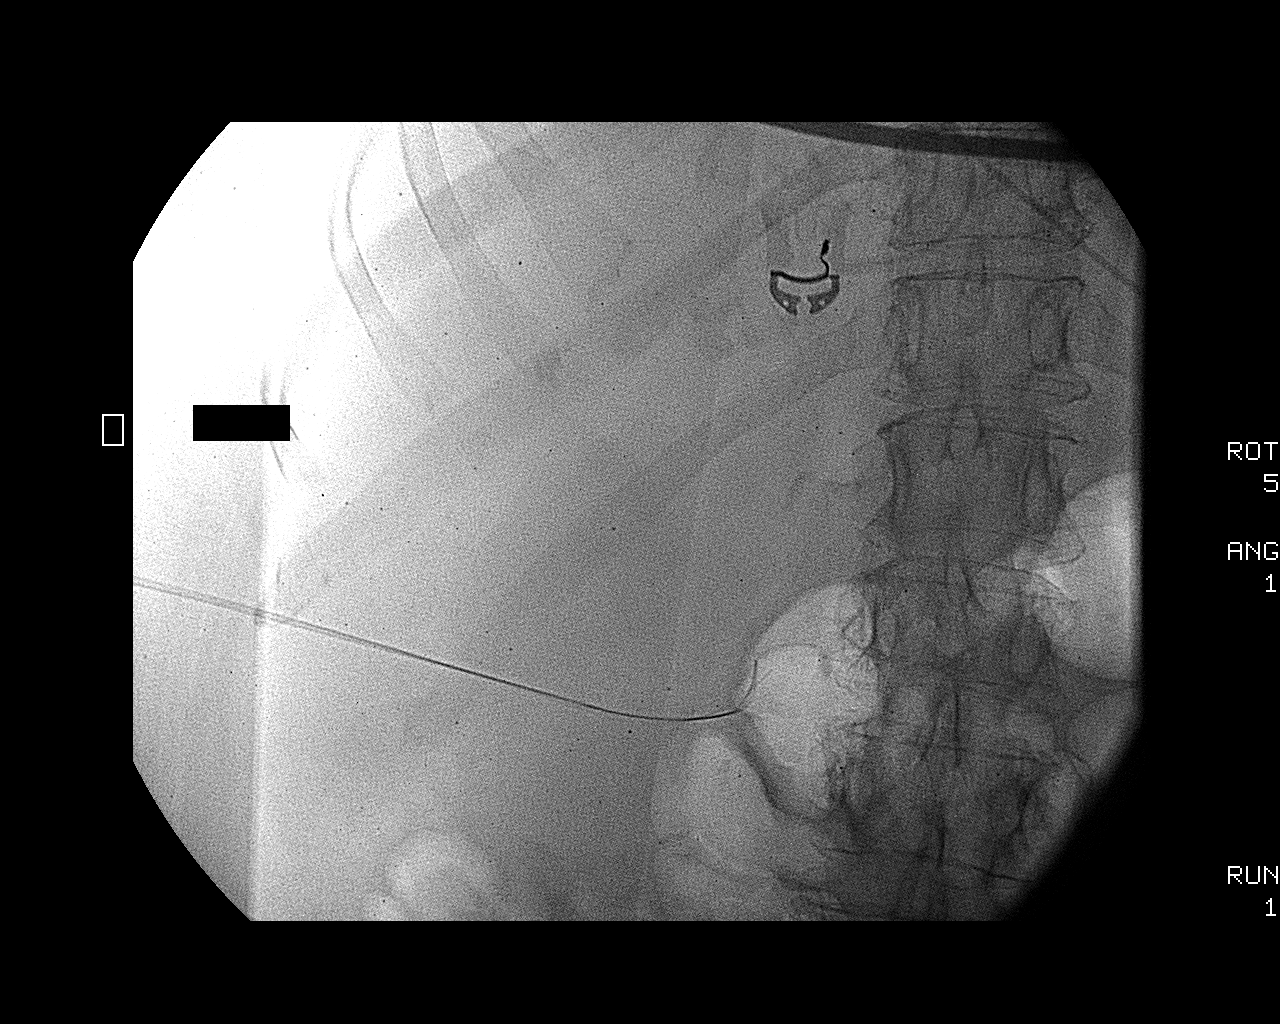
[im 2/2]
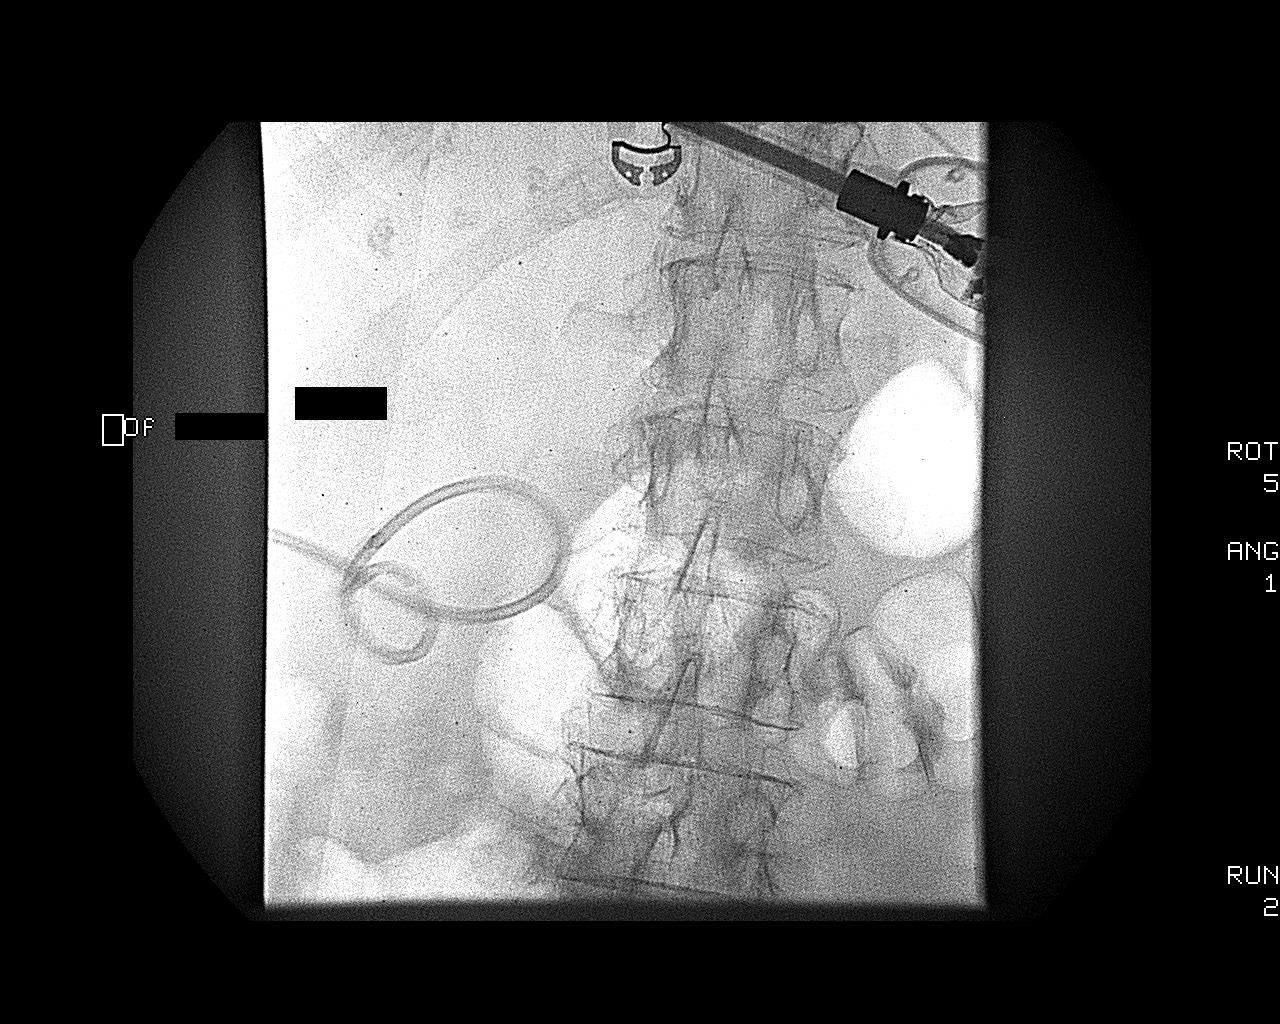

[2 of 2 positions shown; findings below may reference images not displayed]

EXAM:
ULTRASOUND AND FLUOROSCOPIC GUIDED CHOLECYSTOSTOMY TUBE PLACEMENT

FLUOROSCOPY TIME:  1 minutes and 30 seconds.  46.31 mGy

MEDICATIONS:
1.5 mg Versed, 75 mcg fentanyl. A radiology nurse monitored the
patient for moderate sedation.

ANESTHESIA/SEDATION:
Moderate sedation time: 15 minutes

PROCEDURE:
The procedure was explained to the patient. The risks and benefits
of the procedure were discussed and the patient's questions were
addressed. Informed consent was obtained from the patient. Patient
was placed supine on the interventional table. The right upper
abdomen was evaluated with ultrasound. The right upper abdomen was
prepped and draped in sterile fashion. Maximal barrier sterile
technique was utilized including caps, mask, sterile gowns, sterile
gloves, sterile drape, hand hygiene and skin antiseptic. 1%
lidocaine was used for local anesthetic. A 21 gauge needle was
directed into the gallbladder from a transhepatic approach. A
wire was advanced of the gallbladder with fluoroscopy. An Accustick
dilator set was placed. A small amount of brown purulent fluid was
aspirated. Tract was dilated over a J-wire. A 10.2 French
multipurpose drain was advanced over the wire and reconstituted in
the gallbladder. Approximately 90 mL of purulent biliary fluid was
aspirated. Catheter was sutured to the skin and attached to gravity
bag. Fluid was sent for culture. Fluoroscopic and ultrasound images
were taken and saved for documentation.
FINDINGS: Gallbladder was moderately distended with a small amount of wall
thickening and/or pericholecystic fluid. Tube was successful placed
in the gallbladder and the gallbladder was decompressed at the end
of the procedure.

Estimated blood loss: Minimal

COMPLICATIONS:
None
IMPRESSION: Successful percutaneous cholecystostomy tube placement.

## 2017-04-04 ENCOUNTER — Ambulatory Visit (INDEPENDENT_AMBULATORY_CARE_PROVIDER_SITE_OTHER): Payer: Medicare HMO | Admitting: Student in an Organized Health Care Education/Training Program

## 2017-04-04 ENCOUNTER — Encounter: Payer: Self-pay | Admitting: Student in an Organized Health Care Education/Training Program

## 2017-04-04 VITALS — BP 135/80 | Ht 63.0 in | Wt 206.7 lb

## 2017-04-04 DIAGNOSIS — F411 Generalized anxiety disorder: Secondary | ICD-10-CM | POA: Diagnosis not present

## 2017-04-04 DIAGNOSIS — M47816 Spondylosis without myelopathy or radiculopathy, lumbar region: Secondary | ICD-10-CM | POA: Diagnosis not present

## 2017-04-04 DIAGNOSIS — G43009 Migraine without aura, not intractable, without status migrainosus: Secondary | ICD-10-CM

## 2017-04-04 DIAGNOSIS — R2689 Other abnormalities of gait and mobility: Secondary | ICD-10-CM

## 2017-04-04 DIAGNOSIS — H8103 Meniere's disease, bilateral: Secondary | ICD-10-CM

## 2017-04-04 DIAGNOSIS — Z79891 Long term (current) use of opiate analgesic: Secondary | ICD-10-CM

## 2017-04-04 DIAGNOSIS — M170 Bilateral primary osteoarthritis of knee: Secondary | ICD-10-CM

## 2017-04-04 DIAGNOSIS — I10 Essential (primary) hypertension: Secondary | ICD-10-CM | POA: Diagnosis not present

## 2017-04-04 DIAGNOSIS — Z79899 Other long term (current) drug therapy: Secondary | ICD-10-CM

## 2017-04-04 DIAGNOSIS — G43909 Migraine, unspecified, not intractable, without status migrainosus: Secondary | ICD-10-CM | POA: Insufficient documentation

## 2017-04-04 DIAGNOSIS — Z87891 Personal history of nicotine dependence: Secondary | ICD-10-CM | POA: Diagnosis not present

## 2017-04-04 DIAGNOSIS — H8109 Meniere's disease, unspecified ear: Secondary | ICD-10-CM

## 2017-04-04 DIAGNOSIS — Z8679 Personal history of other diseases of the circulatory system: Secondary | ICD-10-CM

## 2017-04-04 DIAGNOSIS — R69 Illness, unspecified: Secondary | ICD-10-CM | POA: Diagnosis not present

## 2017-04-04 MED ORDER — ALPRAZOLAM 0.5 MG PO TABS: 0.5000 mg | ORAL_TABLET | Freq: Four times a day (QID) | ORAL | 2 refills | Status: DC | PRN

## 2017-04-04 MED ORDER — ASPIRIN EC 81 MG PO TBEC: 81.0000 mg | DELAYED_RELEASE_TABLET | Freq: Every day | ORAL | 2 refills | Status: AC

## 2017-04-04 MED ORDER — SUMATRIPTAN SUCCINATE 100 MG PO TABS: 100.0000 mg | ORAL_TABLET | Freq: Once | ORAL | 2 refills | Status: DC | PRN

## 2017-04-04 MED ORDER — OXYCODONE HCL 5 MG PO TABS: ORAL_TABLET | ORAL | 0 refills | Status: DC

## 2017-04-04 NOTE — Assessment & Plan Note (Signed)
Symptomatically stable. She still reports good benefit from low dose alprazolam on both anxiety and with meniere's disease. No falls or other adverse side effects. Urine tox screen checked today. Plan to continue Alprazolam 0.5mg  q6 hours prn #120 per month, I gave a three month supply. All refills in a visit with me please.

## 2017-04-04 NOTE — Assessment & Plan Note (Signed)
Diagnosed in 2013 and previously followed with ENT and neurology. Symptoms have been intermittent but stable. Gets sudden onset dizziness that can last for days. Takes meclizine as needed. I do not think meclizine is a good long term medicine given polypharmacy. We will discontinue intermittent meclizine for now. The alprazolam which is prescribed for anxiety will also work for intermittent vertigo associated with menieres.

## 2017-04-04 NOTE — Patient Instructions (Signed)
1. Take your medicines as prrescribed.  2. We will try Imitrex for your migraine-type headaches. If the headaches get worse or change in character, call me and we will order an MRI of your brain.   3. Please reduce the amount of Meclizine you are taking. I do not think this is a good long term medicine for your dizziness as it can interact with the xanax and the oxycodone and increase your risk for complications like falls.

## 2017-04-04 NOTE — Assessment & Plan Note (Signed)
Chronic pain syndrome from osteoarthritis of lumbar spine and knees. Pain level and functional status are improved with current oxycodone dosing. No falls or side effects. Urine tox appropriate in Jennifer [name] 2017, repeated today. I have a controlled contract with her. I reviewed the [place] which shows appropriate dispensing. Plan to continue oxycodone 5mg  q6 hours prn with #105 per month, I gave her a three month supply. All refills in visits with me please.

## 2017-04-04 NOTE — Assessment & Plan Note (Signed)
Patient reports return of migraine type headaches, with associated nausea, photophobia, and disability. Occurring about 1-2 per week for the last 2 months. Similar to prior migraines she had when younger. Occasionally wakes her from sleep. 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 Neuro exam is normal.  I am going to treat as migraine for now with sumatriptan as needed. If headaches worsen, persist, or change in quality I will repeat the MRI/MRA brain to rule out new aneurysm or mass lesion.

## 2017-04-04 NOTE — Assessment & Plan Note (Signed)
Blood pressure well controlled today. Plan to continue Nifedipine 60mg  daily.

## 2017-04-04 NOTE — Progress Notes (Signed)
Assessment and Plan:  See Encounters tab for problem-based medical decision making.   __________________________________________________________  HPI:  76 year old woman comes in today for follow-up of hypertension and chronic pain. She is doing fairly well at home. Still lives with her daughter and a small apartment. No falls since I last seen her. No trips emergency department or recent admissions. New complaint today is of recurrent headaches over the last 2 months. She describes the headaches as generalized, radiating from the front of her head down to her neck. She reports that they are similar in character to migraines that she is to have his young woman. Denies aura. Does endorse photophobia. Does associate with nausea and vomiting. The headaches are very disabling to last several hours. She began getting him about 1-2 per week for the last 2 months. Does say that on occasion it awakes her from sleep at nights, also says that she has neck pain at the same time.  Reports good compliance with her chronic medications. Denies adverse side effects. Recently established with food stamp program which has been helpful for her. Denies recent episodes of vertigo, hasn't used meclizine in several months. Reports that her anxiety is well controlled on current medications. Pain is also well controlled, she is able to walk around with the assistance of a cane. No fevers or chills. No chest pain, orthopnea, angina, or PND.  __________________________________________________________  Problem List: Patient Active Problem List   Diagnosis Date Noted  . Chronic use of opiate for therapeutic purpose 10/20/2015    Priority: High  . At high risk for falls 07/21/2015    Priority: High  . Migraine 04/04/2017    Priority: Medium  . Atherosclerosis of aorta ([place]) 10/20/2015    Priority: Medium  . Abdominal aortic aneurysm ([place]) 05/29/2015    Priority: Medium  . Obstructive sleep apnea 04/17/2008   Priority: Medium  . Meniere disease 03/23/2007    Priority: Medium  . Generalized osteoarthrosis 01/13/2007    Priority: Medium  . Generalized anxiety disorder 12/29/2006    Priority: Medium  . Depression 12/29/2006    Priority: Medium  . 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 Preventative health care 09/11/2013    Priority: Low  . Osteopenia 10/23/2009    Priority: Low  . Gastroesophageal reflux disease 04/19/2008    Priority: Low  . Essential hypertension 12/29/2006    Priority: Low    Medications: Reconciled today in Epic __________________________________________________________  Physical Exam:  Vital Signs: Vitals:   04/04/17 1052  BP: 135/80  SpO2: 100%  Weight: 206 lb 11.2 oz (93.8 kg)  Height: 5\' 3"  (1.6 m)    Gen: Well appearing, NAD Neck: No cervical LAD, No thyromegaly or nodules, No JVD. CV: RRR, no murmurs Pulm: Normal effort, CTA throughout, no wheezing Ext: Warm, no edema. Skin: No atypical appearing moles. No rashes Neuro: shuffling gait, slow to get to the exam table, Alert and oriented, conversational, she has a unidirectional left sided [place] nystagmus, normal strength in the upper and lower extremities.

## 2017-04-09 LAB — TOXASSURE SELECT,+ANTIDEPR,UR

## 2017-04-28 ENCOUNTER — Ambulatory Visit: Payer: Medicare HMO

## 2017-05-01 ENCOUNTER — Other Ambulatory Visit: Payer: Self-pay | Admitting: Student in an Organized Health Care Education/Training Program

## 2017-05-12 ENCOUNTER — Other Ambulatory Visit: Payer: Self-pay | Admitting: Student in an Organized Health Care Education/Training Program

## 2017-05-14 IMAGING — RF DG CHOLANGIOGRAM OPERATIVE
1 series · 4 of 4 positions shown · non-contrast
Comparison: Ultrasound fluoroscopic guided cholecystostomy tube
placement - 04/26/2015;

CLINICAL DATA: Intraoperative cholangiogram for cholecystitis

EXAM:
INTRAOPERATIVE CHOLANGIOGRAM
FLUOROSCOPY TIME:  9 seconds

[Series 1: run · 4 of 39 frames shown]
[frame 6/39]
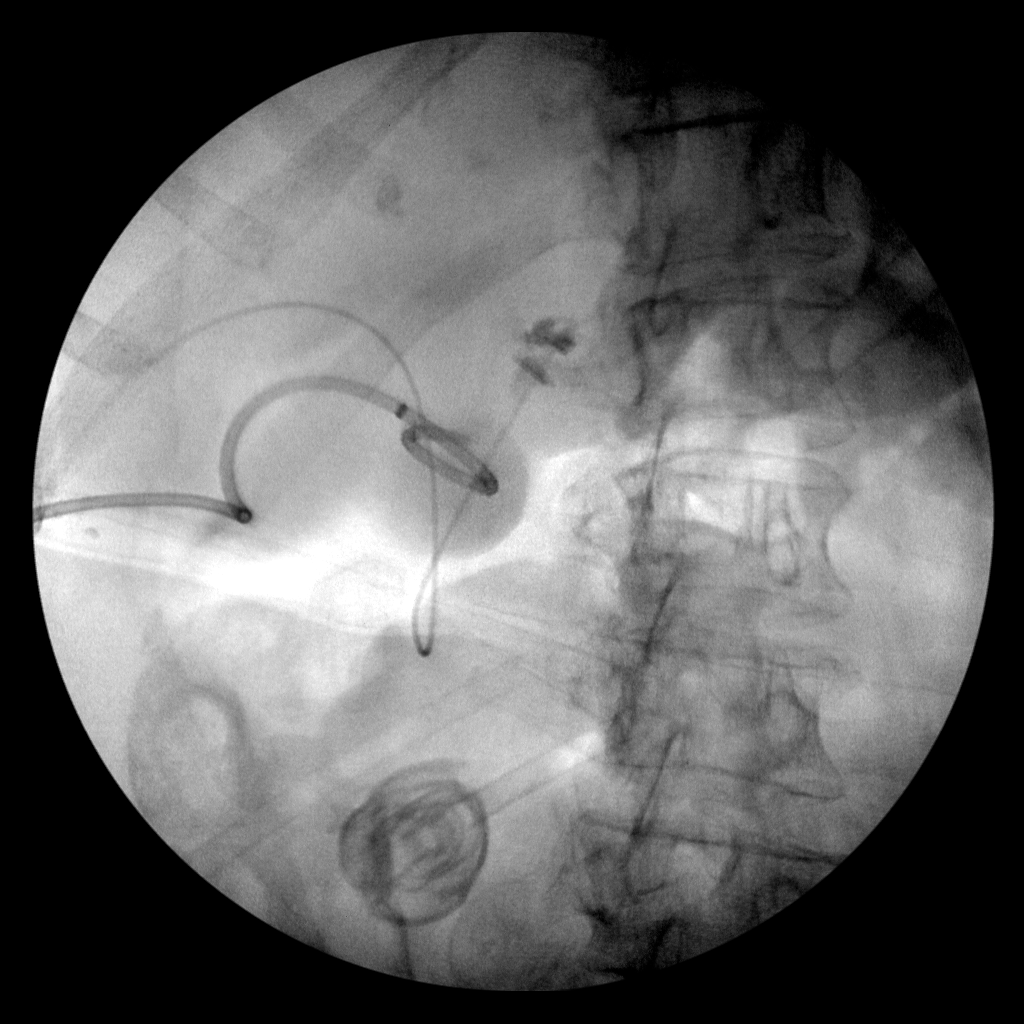
[frame 8/39]
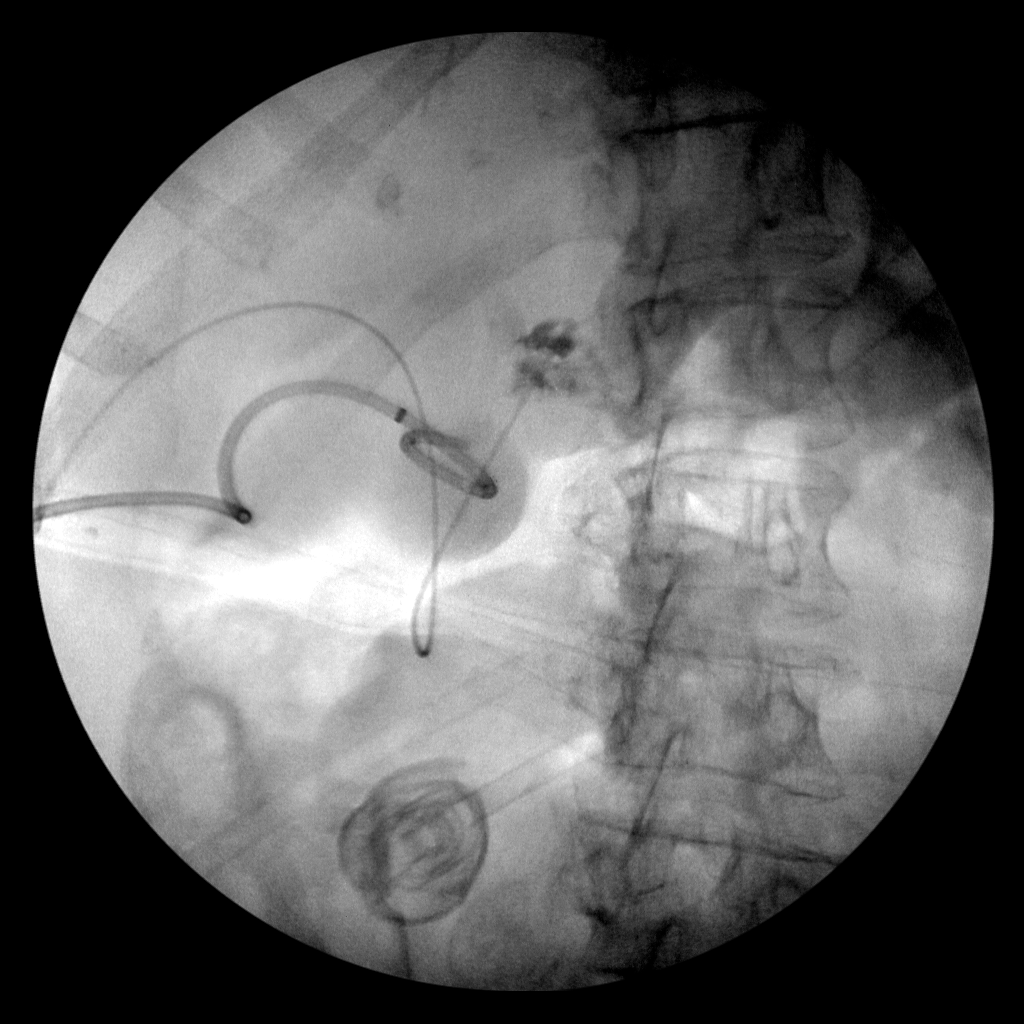
[frame 20/39]
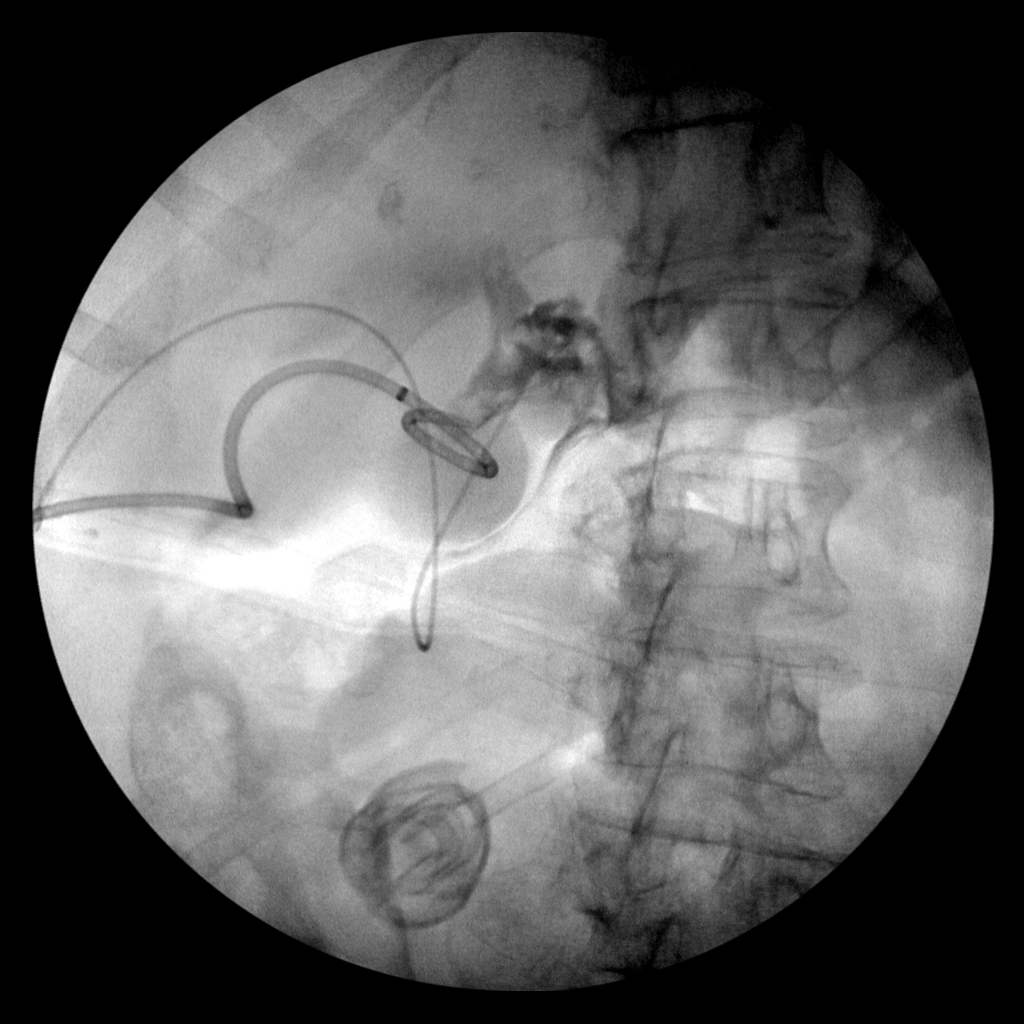
[frame 34/39]
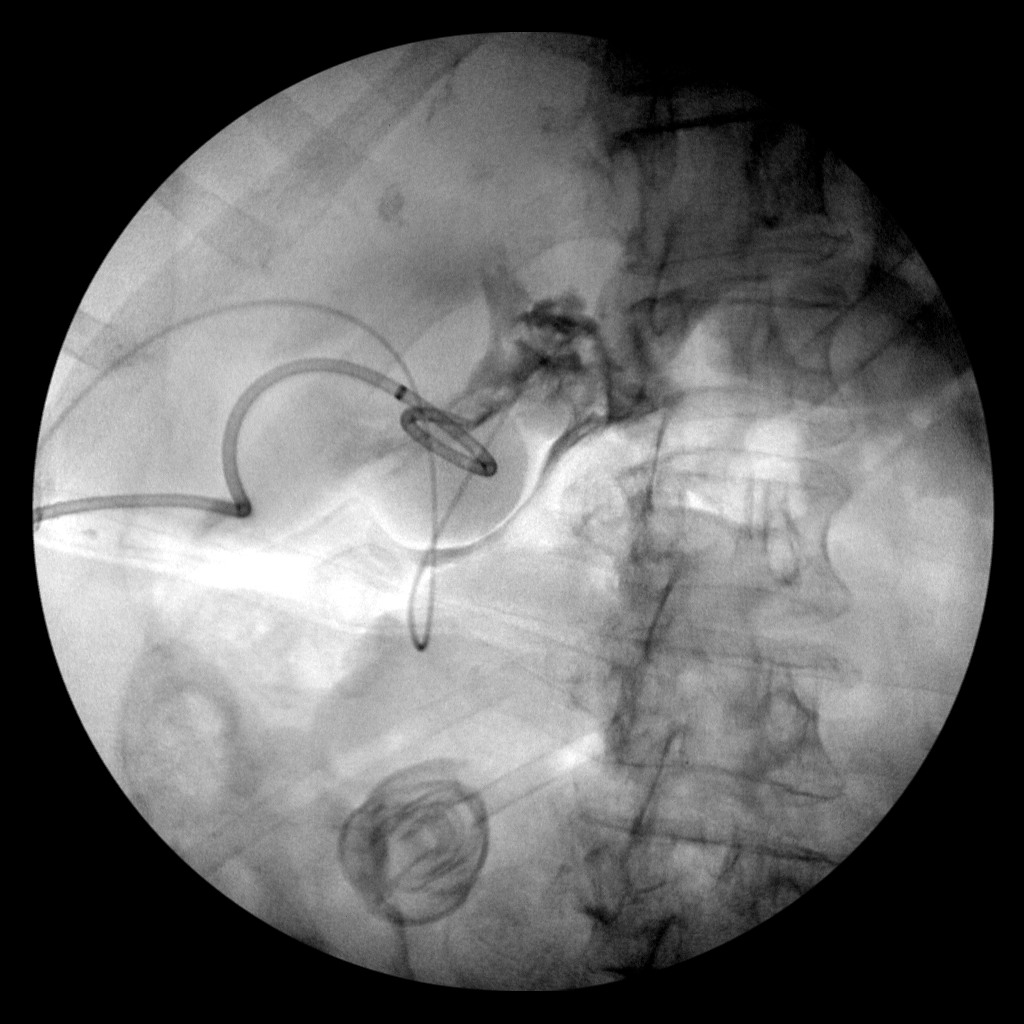

[4 of 4 positions shown; findings below may reference images not displayed]

cholangiogram via existing cholecystostomy
tube -06/03/2015; 05/25/2015; CT abdomen pelvis -06/02/2025
FINDINGS: Surgical clips overlie the expected location of the neck of the
gallbladder.

A cholecystostomy tube overlies expected location of the gallbladder
fossa.

Contrast injection fails to opacify any identifiable portion of the
biliary system extravasation of contrast about the gallbladder
fossa.
IMPRESSION: Nondiagnostic cholangiogram as above. Further evaluation with ERCP
could be performed as clinically indicated

## 2017-06-13 ENCOUNTER — Encounter: Payer: Medicare HMO | Admitting: Student in an Organized Health Care Education/Training Program

## 2017-06-25 ENCOUNTER — Other Ambulatory Visit: Payer: Self-pay | Admitting: Student in an Organized Health Care Education/Training Program

## 2017-06-27 ENCOUNTER — Ambulatory Visit (INDEPENDENT_AMBULATORY_CARE_PROVIDER_SITE_OTHER): Payer: Medicare HMO | Admitting: Student in an Organized Health Care Education/Training Program

## 2017-06-27 VITALS — BP 152/99 | HR 83 | Temp 98.0°F | Ht 63.0 in | Wt 213.8 lb

## 2017-06-27 DIAGNOSIS — Z87891 Personal history of nicotine dependence: Secondary | ICD-10-CM

## 2017-06-27 DIAGNOSIS — K219 Gastro-esophageal reflux disease without esophagitis: Secondary | ICD-10-CM

## 2017-06-27 DIAGNOSIS — R69 Illness, unspecified: Secondary | ICD-10-CM | POA: Diagnosis not present

## 2017-06-27 DIAGNOSIS — Z79899 Other long term (current) drug therapy: Secondary | ICD-10-CM | POA: Diagnosis not present

## 2017-06-27 DIAGNOSIS — I10 Essential (primary) hypertension: Secondary | ICD-10-CM

## 2017-06-27 DIAGNOSIS — F411 Generalized anxiety disorder: Secondary | ICD-10-CM | POA: Diagnosis not present

## 2017-06-27 DIAGNOSIS — Z79891 Long term (current) use of opiate analgesic: Secondary | ICD-10-CM | POA: Diagnosis not present

## 2017-06-27 DIAGNOSIS — Z9181 History of falling: Secondary | ICD-10-CM

## 2017-06-27 DIAGNOSIS — G43009 Migraine without aura, not intractable, without status migrainosus: Secondary | ICD-10-CM | POA: Diagnosis not present

## 2017-06-27 MED ORDER — SUMATRIPTAN SUCCINATE 100 MG PO TABS: 100.0000 mg | ORAL_TABLET | Freq: Once | ORAL | 2 refills | Status: AC | PRN

## 2017-06-27 MED ORDER — OXYCODONE HCL 5 MG PO TABS: ORAL_TABLET | ORAL | 0 refills | Status: DC

## 2017-06-27 MED ORDER — UMECLIDINIUM BROMIDE 62.5 MCG/INH IN AEPB: 1.0000 | INHALATION_SPRAY | Freq: Every day | RESPIRATORY_TRACT | 5 refills | Status: AC

## 2017-06-27 MED ORDER — ALPRAZOLAM 0.5 MG PO TABS: 0.5000 mg | ORAL_TABLET | Freq: Four times a day (QID) | ORAL | 2 refills | Status: DC | PRN

## 2017-06-27 MED ORDER — OMEPRAZOLE 20 MG PO CPDR: 20.0000 mg | DELAYED_RELEASE_CAPSULE | Freq: Two times a day (BID) | ORAL | 2 refills | Status: DC

## 2017-06-27 MED ORDER — NYSTATIN 100000 UNIT/GM EX POWD: Freq: Two times a day (BID) | CUTANEOUS | 2 refills | Status: AC

## 2017-06-27 NOTE — Patient Instructions (Signed)
1. Continue your medicines as prescribed.   2. Please be sure to keep the oxycodone and alprazolam safe while you are moving. Please remember that our policy is not to replace lost or stolen prescriptions.   3. Use the sumatriptan only as needed for headaches. It is not an ideal daily medication.

## 2017-06-27 NOTE — Assessment & Plan Note (Signed)
Patient with a chronic pain generator of lower back pain for many years. Pain and functional status are much improved over the last few months and well-controlled on current oxycodone. She takes 3-4 tablets per day. She reports having slightly increased pain because she's been lifting more boxes and doing more activity while moving. Tox screen was appropriate in April. I reviewed the controlled database today which also showed appropriate dispensing. I provided her with a 3 month supply of oxycodone 5 mg. She'll follow-up with me in 3 months for refill in visits.

## 2017-06-27 NOTE — Assessment & Plan Note (Signed)
Patient continues to be at high risk for falls owing to polypharmacy. She also has very poor social situation so is been unable to complete courses of physical therapy, gait training, and cannot afford a DEXA scan. I think if her falls become more serious in the future or more frequent we will have to  discontinue oxycodone and/or alprazolam.

## 2017-06-27 NOTE — Assessment & Plan Note (Signed)
The pressure initially elevated, but on recheck was more appropriate at 3:25 systolic. Plan is to continue with nifedipine. We will check BMP today.

## 2017-06-27 NOTE — Progress Notes (Signed)
   Assessment and Plan:  See Encounters tab for problem-based medical decision making.   __________________________________________________________  HPI:   76 year old woman here for follow-up of chronic lower back pain. Patient's committee by her daughter who tells me today that they are being evicted from their home and will effectively be homeless after July 30. They report that they have known about this coming for the last 60 days. Fortunately they feel he could have no or else to go. The patient lives with two daughters and a nephew, and they have 2 dogs. She reports that her back pain is largely been controlled and she has been very functional lately. Indeed today she did not need a wheelchair to walk down to the clinic. She's been using her medications as prescribed. She reports 1 fall in the last 3 months, rolled out of bed about a week ago. Had some bruising on her left lateral hip but is ambulating well and did not need to go to the emergency room. Says she rolled out of bed because it is a small full-size bed and she shares with her daughter. She reports that her headaches are much improved since starting sumatriptan. Denies any fevers or chills, breathing is normal with no cough, no chest pain.  __________________________________________________________  Problem List: Patient Active Problem List   Diagnosis Date Noted  . Chronic use of opiate for therapeutic purpose 10/20/2015    Priority: High  . At high risk for falls 07/21/2015    Priority: High  . Migraine 04/04/2017    Priority: Medium  . Atherosclerosis of aorta ([place]) 10/20/2015    Priority: Medium  . Abdominal aortic aneurysm ([place]) 05/29/2015    Priority: Medium  . Obstructive sleep apnea 04/17/2008    Priority: Medium  . Meniere disease 03/23/2007    Priority: Medium  . Generalized osteoarthrosis 01/13/2007    Priority: Medium  . Generalized anxiety disorder 12/29/2006    Priority: Medium  . Depression  12/29/2006    Priority: Medium  . COPD with emphysema ([place]) 12/29/2006    Priority: Medium  . Preventative health care 09/11/2013    Priority: Low  . Osteopenia 10/23/2009    Priority: Low  . Gastroesophageal reflux disease 04/19/2008    Priority: Low  . Essential hypertension 12/29/2006    Priority: Low    Medications: Reconciled today in Epic __________________________________________________________  Physical Exam:  Vital Signs: Vitals:   06/27/17 1129  BP: (!) 152/99  Pulse: 83  Temp: 98 F (36.7 C)  TempSrc: Oral  SpO2: 100%  Weight: 213 lb 12.8 oz (97 kg)  Height: 5\' 3"  (1.6 m)    Gen: Well appearing, NAD, reading a magazine in her chair CV: RRR, no murmurs Pulm: Normal effort, CTA throughout, no wheezing Ext: Warm, no edema, normal joints Skin: No atypical appearing moles. No rashes Neuro: Normal strength in lower extremities, normal reflexes, no changes in sensation. She is alert and oriented and conversational today.

## 2017-06-27 NOTE — Assessment & Plan Note (Signed)
Headache symptomatically improved with as needed sumatriptan. I think it's find continue with this but I advised against daily use.

## 2017-06-27 NOTE — Assessment & Plan Note (Signed)
Anxiety is symptomatically stable though she has a lot of social stressors right now. She is essentially about to become homeless at the end of July. Her daughters are working to try to find emergency housing, this may end up being a shelter. We will continue with her current medication regimen which is alprazolam 0.5 mg as needed every 6 hours. I would like to be able to reduce this in the future.

## 2017-06-27 NOTE — Assessment & Plan Note (Signed)
Symptomatically stable. I refilled the patient's omeprazole 20 mg twice a day which is her preference.

## 2017-06-28 LAB — BMP8+ANION GAP
Anion Gap: 16 mmol/L (ref 10.0–18.0)
BUN/Creatinine Ratio: 18 (ref 12–28)
BUN: 14 mg/dL (ref 8–27)
CO2: 20 mmol/L (ref 20–29)
Calcium: 9.5 mg/dL (ref 8.7–10.3)
Chloride: 107 mmol/L — ABNORMAL HIGH (ref 96–106)
Creatinine, Ser: 0.77 mg/dL (ref 0.57–1.00)
GFR calc Af Amer: 86 mL/min/{1.73_m2}
GFR calc non Af Amer: 74 mL/min/{1.73_m2}
Glucose: 95 mg/dL (ref 65–99)
Potassium: 4 mmol/L (ref 3.5–5.2)
Sodium: 143 mmol/L (ref 134–144)

## 2017-06-29 ENCOUNTER — Encounter: Payer: Self-pay | Admitting: Student in an Organized Health Care Education/Training Program

## 2017-07-24 ENCOUNTER — Other Ambulatory Visit: Payer: Self-pay | Admitting: Student in an Organized Health Care Education/Training Program

## 2017-07-24 DIAGNOSIS — K219 Gastro-esophageal reflux disease without esophagitis: Secondary | ICD-10-CM

## 2017-07-30 ENCOUNTER — Other Ambulatory Visit: Payer: Self-pay | Admitting: Student in an Organized Health Care Education/Training Program

## 2017-07-30 DIAGNOSIS — K219 Gastro-esophageal reflux disease without esophagitis: Secondary | ICD-10-CM

## 2017-09-12 ENCOUNTER — Other Ambulatory Visit: Payer: Self-pay | Admitting: Student in an Organized Health Care Education/Training Program

## 2017-09-19 ENCOUNTER — Telehealth: Payer: Self-pay

## 2017-09-19 ENCOUNTER — Encounter: Payer: Medicare HMO | Admitting: Student in an Organized Health Care Education/Training Program

## 2017-09-19 ENCOUNTER — Other Ambulatory Visit: Payer: Self-pay | Admitting: *Deleted

## 2017-09-19 NOTE — Telephone Encounter (Signed)
Rtc, this pt states its not for me its for my daughter Jennifer [name], spoke to daughter again and sent request to md

## 2017-09-19 NOTE — Telephone Encounter (Signed)
Needs to speak with a nurse about meds.  

## 2017-09-29 ENCOUNTER — Telehealth: Payer: Self-pay

## 2017-09-29 NOTE — Telephone Encounter (Signed)
ALPRAZolam (XANAX) 0.5 MG tablet,    oxyCODONE (OXY IR/ROXICODONE) 5 MG immediate release tablet   Refill request.

## 2017-10-03 NOTE — Telephone Encounter (Signed)
I would like to see Jennifer [name] in a visit for these refills. She missed her appointment with me on 10/15. You may double book my 8:15 appt on 11/5 and I can address these refills with her then.   Thanks.

## 2017-10-04 ENCOUNTER — Telehealth: Payer: Self-pay | Admitting: Student in an Organized Health Care Education/Training Program

## 2017-10-04 NOTE — Telephone Encounter (Signed)
PT DAUGTHER BEVERLY CALLED, CHECKING ON REFILL FOR PAIN MEDS, PLEASE CALL HER AT [phone]

## 2017-10-10 ENCOUNTER — Ambulatory Visit (HOSPITAL_COMMUNITY)
Admission: RE | Admit: 2017-10-10 | Discharge: 2017-10-10 | Disposition: A | Discharge status: Home / Self Care | Payer: Medicare HMO | Source: Ambulatory Visit | Attending: Student in an Organized Health Care Education/Training Program | Admitting: Student in an Organized Health Care Education/Training Program

## 2017-10-10 ENCOUNTER — Telehealth: Payer: Self-pay | Admitting: Student in an Organized Health Care Education/Training Program

## 2017-10-10 ENCOUNTER — Encounter: Payer: Self-pay | Admitting: Student in an Organized Health Care Education/Training Program

## 2017-10-10 ENCOUNTER — Other Ambulatory Visit: Payer: Self-pay | Admitting: Student in an Organized Health Care Education/Training Program

## 2017-10-10 ENCOUNTER — Ambulatory Visit: Payer: Medicare HMO | Admitting: Student in an Organized Health Care Education/Training Program

## 2017-10-10 VITALS — BP 132/86 | HR 88 | Temp 98.2°F | Ht 63.0 in | Wt 202.4 lb

## 2017-10-10 DIAGNOSIS — Z79899 Other long term (current) drug therapy: Secondary | ICD-10-CM | POA: Diagnosis not present

## 2017-10-10 DIAGNOSIS — Z9989 Dependence on other enabling machines and devices: Secondary | ICD-10-CM | POA: Diagnosis not present

## 2017-10-10 DIAGNOSIS — I10 Essential (primary) hypertension: Secondary | ICD-10-CM

## 2017-10-10 DIAGNOSIS — Z23 Encounter for immunization: Secondary | ICD-10-CM | POA: Diagnosis not present

## 2017-10-10 DIAGNOSIS — Z79891 Long term (current) use of opiate analgesic: Secondary | ICD-10-CM

## 2017-10-10 DIAGNOSIS — G4733 Obstructive sleep apnea (adult) (pediatric): Secondary | ICD-10-CM | POA: Diagnosis not present

## 2017-10-10 DIAGNOSIS — M179 Osteoarthritis of knee, unspecified: Secondary | ICD-10-CM | POA: Diagnosis not present

## 2017-10-10 DIAGNOSIS — M25562 Pain in left knee: Secondary | ICD-10-CM | POA: Diagnosis not present

## 2017-10-10 DIAGNOSIS — M1712 Unilateral primary osteoarthritis, left knee: Secondary | ICD-10-CM | POA: Diagnosis not present

## 2017-10-10 DIAGNOSIS — Z9181 History of falling: Secondary | ICD-10-CM

## 2017-10-10 DIAGNOSIS — Z87891 Personal history of nicotine dependence: Secondary | ICD-10-CM | POA: Diagnosis not present

## 2017-10-10 DIAGNOSIS — M47816 Spondylosis without myelopathy or radiculopathy, lumbar region: Secondary | ICD-10-CM | POA: Diagnosis not present

## 2017-10-10 DIAGNOSIS — S8992XA Unspecified injury of left lower leg, initial encounter: Secondary | ICD-10-CM | POA: Diagnosis not present

## 2017-10-10 DIAGNOSIS — W19XXXA Unspecified fall, initial encounter: Secondary | ICD-10-CM | POA: Insufficient documentation

## 2017-10-10 DIAGNOSIS — G8929 Other chronic pain: Secondary | ICD-10-CM | POA: Diagnosis not present

## 2017-10-10 DIAGNOSIS — F411 Generalized anxiety disorder: Secondary | ICD-10-CM

## 2017-10-10 DIAGNOSIS — R296 Repeated falls: Secondary | ICD-10-CM

## 2017-10-10 DIAGNOSIS — R69 Illness, unspecified: Secondary | ICD-10-CM | POA: Diagnosis not present

## 2017-10-10 MED ORDER — NIFEDIPINE ER OSMOTIC RELEASE 60 MG PO TB24: ORAL_TABLET | ORAL | 3 refills | Status: AC

## 2017-10-10 MED ORDER — SERTRALINE HCL 100 MG PO TABS: ORAL_TABLET | ORAL | 1 refills | Status: AC

## 2017-10-10 MED ORDER — OXYCODONE HCL 5 MG PO TABS: ORAL_TABLET | ORAL | 0 refills | Status: DC

## 2017-10-10 MED ORDER — OXYCODONE HCL 5 MG PO TABS: ORAL_TABLET | ORAL | 0 refills | Status: AC

## 2017-10-10 MED ORDER — ALPRAZOLAM 0.5 MG PO TABS: 0.5000 mg | ORAL_TABLET | Freq: Four times a day (QID) | ORAL | 2 refills | Status: AC | PRN

## 2017-10-10 NOTE — Progress Notes (Signed)
   Assessment and Plan:  See Encounters tab for problem-based medical decision making.   __________________________________________________________  HPI:   76 year old woman here for follow-up of chronic lower back pain.  I see the patient every 3 months to refill oxycodone and alprazolam.  She missed our last appointment about 2 weeks ago, her daughter says that she had  a self-limited diarrhea.  She subsequently called in asking for a refill outside of an office visit, and I insisted that they be added on and seen.  She came to clinic today 45 minutes late for our appointment.  At her last visit about 3 months ago the patient and her daughter were having difficulty with housing, they were being evicted  and did not have a place to live.  They say that a few months ago they did find a house in [place] for a family member that they are renting now.  They say it is safe, comfortable, and convenient.  Patient reports that she has been out of both oxycodone and alprazolam for about 1 week because of the missed appointment.  She reports 2 falls over the last few days, 1 rule out of bed and one fall from standing.  She says she fell on her left knee and now this is being more painful than usual.  She also thinks it swollen.  She still able to walk on it but needs her walker or a cane.  She says she has been falling like this all her life.  Denies any other changes in her medications.  No visits to the emergency department or hospitalizations.  Reports good compliance with her medications.  No recent fevers or chills, chest pain, dizziness, lightheadedness.  __________________________________________________________  Problem List: Patient Active Problem List   Diagnosis Date Noted  . Chronic use of opiate for therapeutic purpose 10/20/2015    Priority: High  . At high risk for falls 07/21/2015    Priority: High  . Migraine 04/04/2017    Priority: Medium  . Atherosclerosis of aorta ([place])  10/20/2015    Priority: Medium  . Abdominal aortic aneurysm ([place]) 05/29/2015    Priority: Medium  . Obstructive sleep apnea 04/17/2008    Priority: Medium  . Meniere disease 03/23/2007    Priority: Medium  . Generalized osteoarthrosis 01/13/2007    Priority: Medium  . Generalized anxiety disorder 12/29/2006    Priority: Medium  . Depression 12/29/2006    Priority: Medium  . COPD with emphysema ([place]) 12/29/2006    Priority: Medium  . Preventative health care 09/11/2013    Priority: Low  . Osteopenia 10/23/2009    Priority: Low  . Gastroesophageal reflux disease 04/19/2008    Priority: Low  . Essential hypertension 12/29/2006    Priority: Low    Medications: Reconciled today in Epic __________________________________________________________  Physical Exam:  Vital Signs: Vitals:   10/10/17 0915  BP: 132/86  Pulse: 88  Temp: 98.2 F (36.8 C)  TempSrc: Oral  SpO2: 98%  Weight: 202 lb 6.4 oz (91.8 kg)  Height: 5\' 3"  (1.6 m)    Gen: Well appearing, NAD Pulm: Normal effort Abd: Soft, NT, ND. Ext: Warm, no edema, left knee has a small effusion laterally, no joint laxity, patella non-displaced. Normal range of motion. Skin: No atypical appearing moles. No rashes

## 2017-10-10 NOTE — Assessment & Plan Note (Signed)
Hypertension is well controlled. Plan to continue Nifedipine daily.

## 2017-10-10 NOTE — Telephone Encounter (Signed)
  Medication refill during visit with pcp

## 2017-10-10 NOTE — Telephone Encounter (Signed)
I called about xray result, spoke with Rise Paganini (daughter). No fracture seen, no effusion either, consistent with my exam. Went over plan for conservative management. Recommended against knee brace. Rise Paganini understood and will communicate that to the patient.

## 2017-10-10 NOTE — Assessment & Plan Note (Signed)
Anxiety symptoms are stable, her socioeconomic insecurity has improved since her last visit.  She now has more stable housing.  She finds great benefit to current alprazolam dosing, has been stable for many years.  The plan is to continue alprazolam 0.5 mg every 6 hours, I gave her a 41-month supply.  I reiterated all future refills should be done with me in a visit.

## 2017-10-10 NOTE — Assessment & Plan Note (Signed)
Chronic pain generator is lower back pain due to osteoarthritis.  She has been out of oxycodone for about 1 week because she missed her last appointment.  Says she has been making do with Tylenol.  Historically she has done very well with oxycodone daily to improve functional status.  Plan is to check a tox screen today.  I reviewed the controlled database which showed appropriate dispensing.  I provided her with a 68-month supply of oxycodone 5 mg, 3-4 tablets/day.  She will follow-up with me in 3 months.  I reiterated that she has to be seen in visits for all future refills, and she understands.

## 2017-10-10 NOTE — Assessment & Plan Note (Signed)
Left knee pain due to fall on that knee. Benign exam with no sign of fracture, ligament injury, maybe a small effusion. Plan is to check knee xray to rule out small fracture. I encouraged her to use her walker at home, has plenty of DME. I also advised that we may have to reduce centrally acting medications in the future, which she is asking me not to do because they are helpful for her chronic pain and anxiety.

## 2017-10-10 NOTE — Assessment & Plan Note (Signed)
Severe osa diagnosed 2009, good compliance with CPAP per patient. She is asking for a new CPAP order because her machine is 78 years old, and she says a home health company can upgrade her to a new machine. I have re-ordered the CPAP with current settings.

## 2017-10-12 NOTE — Patient Instructions (Addendum)
BUFFERED ISOTONIC SALINE NASAL IRRIGATION  The Benefits:  1. When you irrigate, the isotonic saline (salt water) acts as a solvent and washes the mucus crusts and other debris from your nose.  2. This decongests and improves the airflow into your nose. The sinus passages begin to open.  3. Studies have also shown that a salt water and an alkaline (baking soda) irrigation solution improves nasal membrane cell function (mucociliary flow of mucus debris).  The Recipe:  1. Choose a 1-quart glass jar that is thoroughly cleansed.  2. Fill with sterile or distilled water, or you can boil water from the tap.  3. Add 1 to 2 heaping teaspoons of "pickling/canning/sea" salt (NOT table salt as it contains a large number of additives). This salt is available at the grocery store in the food canning section.  4. Add 1 teaspoon of [employer] Baking Soda (pure bicarbonate).  5. Mix ingredients together and store at room temperature. Discard after one week. If you find this solution too strong, you may decrease the amount of salt added to 1 to 1  teaspoons. With children it is often best to start with a milder solution and advance slowly. Irrigate with 240 ml (8 oz) twice daily.  The Instructions:  You should plan to irrigate your nose with buffered isotonic saline 2 times per day. Many people prefer to warm the solution slightly in the microwave - but be sure that the solution is NOT HOT. Stand over the sink (some do this in the shower) and squirt the solution into each side of your nose, keeping your mouth open. This allows you to spit the saltwater out of your mouth. It will not harm you if you swallow a little.  If you have been told to use a nasal steroid such as Flonase, Nasonex, or Nasacort, you should always use isortonic saline solution first, then use your nasal steroid product. The nasal steroid is much more effective when sprayed onto clean nasal membranes and the steroid medicine will reach  deeper into the nose.  Most people experience a little burning sensation the first few times they use a isotonic saline solution, but this usually goes away within a few days.    

## 2017-10-19 LAB — TOXASSURE SELECT,+ANTIDEPR,UR

## 2017-10-31 ENCOUNTER — Other Ambulatory Visit: Payer: Self-pay | Admitting: *Deleted

## 2017-10-31 DIAGNOSIS — K219 Gastro-esophageal reflux disease without esophagitis: Secondary | ICD-10-CM

## 2017-10-31 MED ORDER — OMEPRAZOLE 20 MG PO CPDR: 20.0000 mg | DELAYED_RELEASE_CAPSULE | Freq: Two times a day (BID) | ORAL | 2 refills | Status: AC

## 2017-11-07 ENCOUNTER — Encounter: Payer: Medicare HMO | Admitting: Student in an Organized Health Care Education/Training Program

## 2017-11-17 ENCOUNTER — Other Ambulatory Visit: Payer: Self-pay | Admitting: Student in an Organized Health Care Education/Training Program

## 2017-11-25 ENCOUNTER — Telehealth: Payer: Self-pay | Admitting: Student in an Organized Health Care Education/Training Program

## 2017-11-25 NOTE — Telephone Encounter (Signed)
rtc to [employer], reviewed meds and allergies

## 2017-11-25 NOTE — Telephone Encounter (Signed)
Is wanted to go over patient medications and allergics

## 2017-12-14 ENCOUNTER — Other Ambulatory Visit: Payer: Self-pay | Admitting: Student in an Organized Health Care Education/Training Program

## 2017-12-16 ENCOUNTER — Telehealth: Payer: Self-pay | Admitting: Student in an Organized Health Care Education/Training Program

## 2017-12-16 NOTE — Telephone Encounter (Signed)
HER DAUGHTER BEVERLY CALLED HER MOTHER PASSED THIS MORNING.

## 2017-12-16 NOTE — Telephone Encounter (Signed)
Thank you for letting me know. It sounds like she passed away in her sleep. She had several chronic illnesses so this is not totally unexpected. Most likely related to her atherosclerotic disease which we have been managing for years. 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

## 2017-12-19 ENCOUNTER — Encounter: Payer: Medicare HMO | Admitting: Student in an Organized Health Care Education/Training Program

## 2018-01-06 DEATH — deceased

## 2019-01-01 ENCOUNTER — Encounter: Payer: Self-pay | Admitting: *Deleted

## 2021-06-09 ENCOUNTER — Encounter: Payer: Self-pay | Admitting: *Deleted
